# Patient Record
Sex: Male | Born: 1995 | Race: White | Hispanic: No | Marital: Single
Health system: Midwestern US, Academic
[De-identification: ages and names within clinical notes are randomized; demographics above are authoritative.]

---

## 2017-03-03 MED ORDER — PERFLUTREN LIPID MICROSPHERES 1.1 MG/ML IV SUSP
1-20 mL | Freq: Once | INTRAVENOUS | 0 refills | Status: CP
Start: 2017-03-03 — End: ?

## 2017-05-15 ENCOUNTER — Ambulatory Visit: Admit: 2017-05-15 | Discharge: 2017-05-16 | Payer: BC Managed Care – HMO

## 2017-05-15 ENCOUNTER — Encounter: Admit: 2017-05-15 | Discharge: 2017-05-15

## 2017-05-15 DIAGNOSIS — F250 Schizoaffective disorder, bipolar type: Principal | ICD-10-CM

## 2017-05-15 DIAGNOSIS — F200 Paranoid schizophrenia: Principal | ICD-10-CM

## 2017-05-15 MED ORDER — DIVALPROEX 500 MG PO TB24
2000 mg | ORAL_TABLET | Freq: Every evening | ORAL | 3 refills | Status: SS
Start: 2017-05-15 — End: 2017-06-20

## 2017-05-15 MED ORDER — PALIPERIDONE 9 MG PO TR24
9 mg | ORAL_TABLET | Freq: Every day | ORAL | 1 refills | Status: SS
Start: 2017-05-15 — End: 2017-06-20

## 2017-05-15 NOTE — Progress Notes
Date of Service: 05/15/2017     Subjective:             Jamie [name] is a 21 y.o. male with hx schizoaffective disorder and clozapine-induced myocarditis. Multiple past hospitalizations and 2 prior suicide attempts (jumped off bridge and overdosed on antipsychotic around age 66).    History of Present Illness  Last seen 02/13/17 and no changes were made, continued on Invega 9mg  daily and Depakote 2000mg  qHS.    Today he is accompanied by his father.    They report things have been stable.  Jamie [name] has continued to have periodic episodes where he will have what he terms a break from reality but he reports these episodes do not bother him and he is able to ignore them and not act upon them.  He gives an example of walking down the street and seeing a tree that he is seen several times before appear differently, he wonders if he tried to climb the tree if the branches would be the way he perceives them or the way they were in the past.  He has a small red spot on his wrist that he will stare at, and feels like the shape will change as he stares at it and smaller shapes will develop within it.  He wonders if there is some meaning to this and does not believe that other people have this ability.  He finds it somewhat frustrating. He has not had any impulsive or risky behaviors. Mood has been good. He has some lethargy related to medications but finds it tolerable, gives himself more time to sleep on account of this. Energy, concentration and appetite have been stable.    He continues to live with his 2 brothers, and they moved into a new apartment where he has his own room.  He is unable to drive, but there is a bus stop nearby, and he can use that to get to the [place].  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  He is excited to be able to work.    He reports good compliance with medications.  Every now and then notices a very mild tremor, but prefers not to take Cogentin due to the dry mouth it causes.  He has been trying to diet by limiting portion size, and has also been exercising more to help with his weight.    He denies auditory hallucinations.  Denies suicidal and homicidal ideation.    Social update:  Parents (father and stepmother) are legal guardians.  Approved for disability 2018.  Planning to start a part-time job as Financial risk analyst or in retail in near future.  Does not have car access or insurance due to impulsive behavior and concern for safety.  Lives with his brothers in an apartment.   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  Denies tobacco or alcohol use.  Denies drug use.         Review of Systems   Constitutional: Negative for appetite change and unexpected weight change.   Respiratory: Negative for shortness of breath.    Cardiovascular: Negative for chest pain.   Gastrointestinal: Negative for constipation, diarrhea, nausea and vomiting.   Neurological: Negative for dizziness and syncope.         Objective:         ??? benztropine (COGENTIN) 0.5 mg tablet Take 1 tablet by  mouth twice daily as needed.   ??? carvedilol (COREG) 3.125 mg tablet Take 2 tablets by mouth twice daily. Take with food.   ??? divalproex (DEPAKOTE ER) 500 mg ER tablet Take 4 tablets by mouth at bedtime daily. Take with food.   ??? lisinopril (PRINIVIL, ZESTRIL) 2.5 mg tablet Take 1 tablet by mouth daily.   ??? melatonin 5 mg tab Take 5 mg by mouth at bedtime as needed.   ??? omeprazole DR(+) (PRILOSEC) 40 mg capsule Take 40 mg by mouth at bedtime daily.   ??? paliperidone(+) (INVEGA) 9 mg tablet Take 1 tablet by mouth daily.   ??? spironolactone (ALDACTONE) 25 mg tablet Take 1 tablet by mouth daily with food.     Vitals:    05/15/17 1616   BP: 123/82   Pulse: 69   Weight: 110 kg (242 lb 6.4 oz)     Body mass index is 36.86 kg/m???.     Physical Exam   Psychiatric:   Mental Status Evaluation:??? General/Constitutional: white man, causal attire, good grooming  Speech: normal RRVT   Behavior: cooperative, calm  Thought Process: linear, goal oriented  Thought Content: Denies SI/HI, + magical thinking and delusions   Perception: Denies AVH, does not appear to be responding to internal stimuli  Associations: fair  Mood: good  Affect: euthymic  Insight/Judgement: fair/fair    Orientation: oriented x4  Recent and remote memory: intact  Attention span and concentration: average  Language: [employer] of knowledge and vocabulary: average    Focused Physical Exam:    Gait: Normal        Assessment and Plan:  Schizophrenia   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, following with cardiology  GERD, Obesity   Disabled, parents are legal guardians  Supportive family, no drug/alcohol/tobacco use, motivated to work, cannot drive     Previous med trials: Lithium (bradycardia), Haldol (dystonia, oversedation; likely dose related; patient very reluctant to try again but has tolerated since), Geodon (ineffective), Seroquel (uncontrolled sx), Risperdal (stable for 6-16mos but then relapsed despite compliance, also weight gain), clozapine (myocarditis)    Previously diagnosed with schizoaffective disorder, however after longitudinal review he does not have a primary affect of disorder.  He has had episodes where he would impulsively travel across the country to avoid delusional consequences, however this appears to be more in the context of a delusion then a manic episode.  Diagnosis changed to schizophrenia today.  He is doing well on his current medication regimen, living with his brothers, and is planning to start a part-time job soon.  He is still interested in therapy to augment behavioral strategies to prevent unsafe behaviors when delusions occur.    - Cont Invega 9mg  daily for psychosis   - AIMS 0    - Metabolic labs completed 02/2017  - Cont Depakote ER 2000mg  qHS for mood   - Level 102 02/2017 - Cont Cogentin 0.5mg  BID PRN muscle spasms/rigidity  - Will re-message Dr. Lowry Bowl regarding therapy  - Continues to follow with cardiology     Discussed resources available in crisis including 911, ED, physician on call    RTC 3 months or PRN    Gaylene Brooks, DO

## 2017-05-15 NOTE — Progress Notes
ATTENDING NOTE      Encounter Date: 05/15/2017    I saw and evaluated Jamie [name], discussed with Gaylene Brooks, DO and concur with the assessment and treatment plan.     PRESENTING PROBLEM AND BACKGROUND: Patient is 21 y.o. male with diagnosis bipolar vs schizoaffective disorder hospitalized Feb 2017 and April 2017 for depression with suicidal ideation and in August 2016 and Jan 2018  for psychotic manic episode. Patient started on clozapine during January 2018 hospitalization but developed myocarditis and clozapine discontinued. Cardiac status returned to baseline and patient started on paliperidone along with divalproex. He father is his legal guardian. He recently moved from his parents home to living with two of his brothers near his parents. Last visit patient stable and medications and doses unchanged.     CURRENT TREATMENT AND RESPONSES: Patient and father report relative stability since last visit. 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 Patient describes good mood. He relates delusional experiences (see Dr. Fabian Sharp notes for details) but which he states he is able to ignore. Father reports no unusual behavior. Patient and father agreeable to continuing on current medications at current doses.    PLAN:  1. Continue paliperidone 9 mg qd, divalproex 2000 mg qhs, and benztropine 0.5 mg bid  2. Monitor affect, psychosis, suicidal ideation  3. If paliperidone unable to be continued due to cost, consider switch to risperidone  4. Monitor AIMS, weight and metabolic syndrome labs

## 2017-05-18 ENCOUNTER — Ambulatory Visit: Admit: 2017-05-18 | Discharge: 2017-05-19

## 2017-05-18 ENCOUNTER — Encounter: Admit: 2017-05-18 | Discharge: 2017-05-18

## 2017-05-18 ENCOUNTER — Ambulatory Visit: Admit: 2017-05-18 | Discharge: 2017-05-18

## 2017-05-18 ENCOUNTER — Ambulatory Visit: Admit: 2017-05-18 | Discharge: 2017-05-18 | Payer: BC Managed Care – HMO

## 2017-05-18 DIAGNOSIS — I428 Other cardiomyopathies: Principal | ICD-10-CM

## 2017-05-18 DIAGNOSIS — F250 Schizoaffective disorder, bipolar type: Principal | ICD-10-CM

## 2017-05-18 LAB — BASIC METABOLIC PANEL
Lab: 1 mg/dL (ref 0.4–1.24)
Lab: 11 mg/dL (ref 7–25)
Lab: 137 MMOL/L (ref 137–147)
Lab: 26 MMOL/L (ref 21–30)
Lab: 4.6 MMOL/L (ref 3.5–5.1)
Lab: 6 (ref 3–12)
Lab: 86 mg/dL (ref 70–100)
Lab: 9.9 mg/dL (ref 8.5–10.6)
Lab: >60 mL/min (ref >60)
Lab: >60 mL/min (ref >60)

## 2017-05-18 NOTE — Progress Notes
Date of Service: 05/18/2017    Jamie [name] is a 21 y.o. male.       HPI     Jamie [name] was seen today in heart failure clinic for routine follow-up visit.  He was last seen in our clinic clinic on 03/03/2017 by Dr. Leda Gauze.  During his last office visit he was thought to be doing well with his volume status and his medications were not adjusted.  He was discussed with him that he should consider weight loss and his current echo was reviewed with him.  Dr. Leda Gauze mentioned that he would like to repeat exercise treadmill study to evaluate for any nonsustained VT along with his exercise capacity to determine if he could be removed from his guideline directed medical therapy. Jamie [name] has a history of systolic heart failure with recovered EF thought to be secondary to viral myocarditis in the setting of concomitant use of clozapine.    Echocardiogram on 03/03/2017 showed EF 50%, valves are unremarkable, LVIDD 5.73 centimeters, RV basal diameter 4.26 cm, systolic PA pressure 23 mmHg.    Today Jamie [name] reports since he was last seen in clinic he has been doing well.  He is hoping to be removed off some of HF his medications.  He is excited because starting Monday he will start a new job at [employer].  Denies having any shortness of breath, no cough. Denies chest pain, pressure, or palpitations.  No dizziness or lightheadedness. Denies lower extremity edema or abdominal distention.  He sleeps laying prone and denies PND.  He is not following a low-sodium diet or fluid restriction, also not monitoring his weights.  He reports he completed his treadmill exercise study today and is asking what the results are. He is quite defiant that his heart became weak after being initiated on clozapine and he does not believe that it was related to myocarditis.     Past medical history includes schizoaffective disorder, obesity, GERD, HFrEF with recovered EF, myocarditis.         Vitals:    05/18/17 1256   BP: 140/70 Pulse: 88   SpO2: 98%   Weight: 110.2 kg (243 lb)   Height: 1.727 m (5' 8)     Body mass index is 36.95 kg/m???.     Review of Systems   Constitution: Negative.   HENT: Negative.    Eyes: Negative.    Cardiovascular: Negative.    Respiratory: Negative.    Endocrine: Negative.    Hematologic/Lymphatic: Negative.    Skin: Negative.    Musculoskeletal: Negative.    Gastrointestinal: Negative.    Genitourinary: Negative.    Neurological: Negative.    Psychiatric/Behavioral: Negative.    Allergic/Immunologic: Negative.    All other systems reviewed and are negative.      Physical Exam  General Appearance: overweight male, in no acute distress   Skin: warm, dry  Eyes: conjunctivae and lids normal, pupils are equal and round   Teeth/Gums/Palate: dentition unremarkable, no lesions   Lips & Oral Mucosa: no pallor or cyanosis   Neck Veins: JVP 6 cm, no HJR  Chest Inspection: chest is normal in appearance   Respiratory Effort: breathing comfortably, no respiratory distress   Auscultation: lungs clear to auscultation, no rales or rhonchi, no wheezing   Cardiac Rhythm: regular rhythm and normal rate   Cardiac Auscultation: S1, S2 normal, no rub, no definite S3  or S4   Murmurs: no murmur    Peripheral Circulation: normal peripheral  circulation   Radial Arteries: normal symmetric radial pulses   Pedal Pulses: normal symmetric pedal pulses   Lower Extremity Edema: no lower extremity edema   Abdominal Exam: soft, round, obese, non-tender, no masses, bowel sounds normal   Gait & Station: walks without assistance   Orientation: oriented to time, place and person   Affect & Mood: appropriate and sustained affect, argumentive    Language and Memory: patient responsive and seems to comprehend information   Neurologic Exam: neurological assessment grossly intact   Vital signs were reviewed    Cardiovascular Studies  None today    Problems Addressed Today  Encounter Diagnoses   Name Primary?   ??? Cardiomyopathy Yes   ??? Viral myocarditis ??? HTN        Assessment and Plan     1.  Cardiomyopathy.  Jamie [name] presents today with NYHA functional class I AHA stage B symptoms.  His medications include carvedilol 6.25 mg bid, lisinopril 2.5 mg daily, spironolactone 25 mg daily.  He is a guideline directed medical therapies for his heart failure.  Today on exam he appears stable with his volume assessment.  He is not requiring loop diuretics.  At the time of his initial presentation in January 2018 he was found to have flulike symptoms, cardiac MRI showed mild myocarditis thought to be viral process however he had also been recently initiated on clozapine.  Initially his EF declined to 25% however quickly returned to normal function.  Most recent echo shows EF of 50%.  He has verbalized that he does not think he needs his heart failure medications and is asking today what medications he can stop taking.  He wants to know the results of his treadmill exercise test today.  Discussed with him that he will need to remain on his current medications until Dr. Chales Abrahams has a chance to review his study, will contact him with Dr. Urban Gibson recommendations and if he recommends that Jamie [name] can discontinue any of his HF medications we will provide him this information at that time.  Encouraged him to consider trying to follow a low-salt diet and fluid restriction; he has been resistant to any heart failure education up to this point.  2.  HTN.  Blood pressure today in clinic is 140/70.  Discussed with him that his carvedilol and lisinopril are both helping keep his blood pressure controlled which ultimately is better for his heart.  He is agreeable to maintain his current medications however again emphasizes that he wants to be stopped on them as soon as possible.  3.  Follow-up.  Instructed to schedule appointment in the upcoming 5-6 months with Dr. Leda Gauze.  Will plan to ask Dr. Leda Gauze to review his exercise treadmill study; then will have our clinic staff contact Jamie [name] with his recommendations regarding his medications.  Jamie [name] states understanding and is in agreement with this plan.  Please see AVS for full patient teaching.    Education/care coordination and counseling time spent: 30 minutes of 35 minute visit.  Topics included HF disease process, treatment options, treatment risks and benefits, medication instructions, medication interactions, daily weight monitoring, sodium restriction, understanding of HF symptoms, awareness of when and whom call, and when to schedule next office visit.     Thank you for the opportunity to participate in this pleasant patient's care. Please contact me with any concerns or questions.     Satira Anis APRN   Pager [phone]    Collaborating MD:AJS  Current Medications (including today's revisions)  ??? benztropine (COGENTIN) 0.5 mg tablet Take 1 tablet by mouth twice daily as needed.   ??? carvedilol (COREG) 3.125 mg tablet Take 2 tablets by mouth twice daily. Take with food.   ??? divalproex (DEPAKOTE ER) 500 mg ER tablet Take 4 tablets by mouth at bedtime daily. Take with food.   ??? lisinopril (PRINIVIL, ZESTRIL) 2.5 mg tablet Take 1 tablet by mouth daily.   ??? melatonin 5 mg tab Take 5 mg by mouth at bedtime as needed.   ??? paliperidone ER (+) (INVEGA) 9 mg tablet Take 1 tablet by mouth daily.   ??? spironolactone (ALDACTONE) 25 mg tablet Take 1 tablet by mouth daily with food.

## 2017-05-24 ENCOUNTER — Encounter: Admit: 2017-05-24 | Discharge: 2017-05-24

## 2017-06-14 ENCOUNTER — Encounter: Admit: 2017-06-14 | Discharge: 2017-06-14

## 2017-06-14 NOTE — Telephone Encounter
Patient calls, states it's close to September, and he always experiences mania during his conception month.  Recent concerns for pt is getting sent home from work.  Pt states he loves his job, has been promoted to making pizzas, however the chemical smell/drugs in the air caused side effects that made it impossible for him to continue working.  Pt states he's experienced sexual stimulation and light-headedness with the recent chemical issue at work.  Pt also has felt he's experiencing a chemical/drug exposure in his home.  Pt reports he is safe at home today.  He reports hallucinating, as "my mind is like a computer when I look at the wall... I see numbers like I'm a computer."  Pt denies AH, although he reports that electrical devices can hear him, and he can hear the muted TV with ear buds in.    Offered appointment Wednesday, 8/15 at 8:00 am, arrive at 7:45am.  Pt appreciative, states he's been healthy for 7 months and doesn't want to end up being admitted again.  Pt reports med compliance; denies new stressors.

## 2017-06-15 ENCOUNTER — Encounter: Admit: 2017-06-15 | Discharge: 2017-06-15

## 2017-06-15 ENCOUNTER — Ambulatory Visit: Admit: 2017-06-15 | Discharge: 2017-06-16 | Payer: BC Managed Care – HMO

## 2017-06-15 DIAGNOSIS — R45851 Suicidal ideations: Secondary | ICD-10-CM

## 2017-06-15 DIAGNOSIS — R443 Hallucinations, unspecified: ICD-10-CM

## 2017-06-15 DIAGNOSIS — F250 Schizoaffective disorder, bipolar type: Principal | ICD-10-CM

## 2017-06-15 LAB — URINALYSIS DIPSTICK: Lab: NEGATIVE mg/dL (ref 0.4–1.24)

## 2017-06-15 LAB — ALCOHOL LEVEL: Lab: <10 mg/dL (ref 7–40)

## 2017-06-15 LAB — PHENCYCLIDINES-URINE RANDOM: Lab: NEGATIVE U/L (ref 25–110)

## 2017-06-15 LAB — COMPREHENSIVE METABOLIC PANEL
Lab: 135 MMOL/L — ABNORMAL LOW (ref 137–147)
Lab: >60 mL/min (ref >60)
Lab: >60 mL/min (ref >60)

## 2017-06-15 LAB — OPIATES-URINE RANDOM: Lab: NEGATIVE g/dL (ref 3.5–5.0)

## 2017-06-15 LAB — SALICYLATE LEVEL

## 2017-06-15 LAB — ACETAMINOPHEN LEVEL: Lab: <10 ug/mL (ref <20.1)

## 2017-06-15 LAB — CBC AND DIFF: Lab: 6.2 10*3/uL (ref 4.5–11.0)

## 2017-06-15 LAB — COCAINE-URINE RANDOM: Lab: NEGATIVE mg/dL (ref 0.3–1.2)

## 2017-06-15 LAB — BENZODIAZEPINES-URINE RANDOM: Lab: NEGATIVE mg/dL (ref 8.5–10.6)

## 2017-06-15 LAB — AMPHETAMINES-URINE RANDOM: Lab: NEGATIVE /HPF (ref 0–3)

## 2017-06-15 LAB — BARBITURATES-URINE RANDOM: Lab: NEGATIVE mg/dL (ref 0–5)

## 2017-06-15 LAB — URINALYSIS, MICROSCOPIC

## 2017-06-15 LAB — CANNABINOIDS-URINE RANDOM: Lab: NEGATIVE g/dL — AB (ref 6.0–8.0)

## 2017-06-15 MED ORDER — RISPERIDONE 4 MG PO TAB
4 mg | Freq: Every evening | ORAL | 0 refills | Status: DC
Start: 2017-06-15 — End: 2017-06-16
  Administered 2017-06-16: 02:00:00 4 mg via ORAL

## 2017-06-15 MED ORDER — SPIRONOLACTONE 25 MG PO TAB
25 mg | Freq: Every day | ORAL | 0 refills | Status: DC
Start: 2017-06-15 — End: 2017-06-21
  Administered 2017-06-16 – 2017-06-20 (×6): 25 mg via ORAL

## 2017-06-15 MED ORDER — BENZTROPINE 1 MG PO TAB
0.5 mg | Freq: Two times a day (BID) | ORAL | 0 refills | Status: DC | PRN
Start: 2017-06-15 — End: 2017-06-21

## 2017-06-15 MED ORDER — LISINOPRIL 5 MG PO TAB
2.5 mg | Freq: Every day | ORAL | 0 refills | Status: DC
Start: 2017-06-15 — End: 2017-06-21
  Administered 2017-06-16 – 2017-06-20 (×5): 2.5 mg via ORAL

## 2017-06-15 MED ORDER — MELATONIN 5 MG PO TAB
5 mg | Freq: Every evening | ORAL | 0 refills | Status: DC
Start: 2017-06-15 — End: 2017-06-21
  Administered 2017-06-16 – 2017-06-20 (×5): 5 mg via ORAL

## 2017-06-15 MED ORDER — DIVALPROEX 500 MG PO TB24
2000 mg | Freq: Every evening | ORAL | 0 refills | Status: DC
Start: 2017-06-15 — End: 2017-06-17
  Administered 2017-06-16: 02:00:00 2000 mg via ORAL

## 2017-06-15 MED ORDER — CARVEDILOL 6.25 MG PO TAB
6.25 mg | Freq: Two times a day (BID) | ORAL | 0 refills | Status: DC
Start: 2017-06-15 — End: 2017-06-21
  Administered 2017-06-16 – 2017-06-20 (×10): 6.25 mg via ORAL

## 2017-06-15 NOTE — Progress Notes
Subjective:       Jamie [name] is a 21 y.o. male with a history of schizophrenia vs schizoaffective d/o and clozapine-induced myocarditis. Last seen on 7/16 by Dr. Donnald Garre and at that time was continued on Invega 9mg  Qday and Depakote 2000mg  QHS.     Patient called in yesterday with complaints concerning for decompensation and was scheduled for an appointment this AM.    Presents this AM stating that he has been feeling somewhat manic. States that he has been inhaling a drug at work that has been planted there by [employer]. Feels that [employer] has been targeting him historically and states he does not know why. 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 He is unable to specify what this drug is, maybe THC?Marland Kitchen He denies using any actual drugs himself in a voluntary fashion. States that I don't want to be high, but that the drug he smells makes him feel that way. Describes being high as being sexually over stimulated, feeling happy, sometimes losing touch with reality. In addition states that he feels more paranoid than usual, feeling like people are watching him and talking about him. 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 States that he likes this job but, I don't go to work to get high.     Does recall another time where he was exposed to this drug, back when he was working at [employer] in 2016, and this lead him to quit that job. States that other people can also smell the scent, but tell him it's just a chemical, however, believes it affects him differently because my biology is different, and that the drugs are designed to target him some how. Stating that [employer] is a [employer], so can do this if they want.     Denies experiencing any AH. Denies VH other than one instant of seeing small flashes of numbers, starting at end of his peripheral vision and then going across. Was able to make himself stop viewing it by shifting his concentration, this was yesterday.     In addition, states that the drug has now spread to his home, and everywhere I go. States that he can notice it in very small concentrations where others cannot. Last time smelled the drugs prior to these past few days was in Oct of 2017.     Describes ability to control the weather. States that he has a Belief that I'm supernatural. Does at times feel that he is getting special messages from people, things on their car. Things that happen are Not coincidental - gives example of flipping coins a certain number of times and receiving special messages based on what they want me to know at the time, in reference to spiritual entities.      Mood is elevated and agitated. Denies feeling depressed. States that he is trying to force himself to sleep but feels like he can go for several nights without any sleep if he allows himself to.     In addition, states that best thing that can happen  to me is death, but I cannot die. States that he tried to kill himself in the past but seems to always get rescued. States I would slit my throat right now if I thought I would die, but I know someone would just take me to the hospital and fix it. Recalls his past suicide attempts and voices that he is unable to die. He would like to end his life because spirital beings have big plans for him, which are evil plans that he does not want to engage in and therefore feels like ending his life would be best. Also states that if he knew who planted the drugs in his work and home that he would want to kill those people, however, doesn't know which individuals did this at this time, other than [employer].     States that the only reason he came in today was to see this provider's opinion as to whether he should go back to work and try to keep his job or if he should stop going given that he is being exposed to the drugs there. Doesn't want to start new medication and is very resistant to having medications adjusted as well, describes having hadbad history with trials of new medications.     Drugs - denies  Alcohol - Denies    Currently living with his brothers             Review of Systems   Constitutional: Negative for fatigue.   Eyes: Positive for visual disturbance.   Musculoskeletal: Negative for gait problem.   Psychiatric/Behavioral: Positive for suicidal ideas. Negative for dysphoric mood.         Objective:         ??? benztropine (COGENTIN) 0.5 mg tablet Take 1 tablet by mouth twice daily as needed.   ??? carvedilol (COREG) 3.125 mg tablet Take 2 tablets by mouth twice daily. Take with food.   ??? divalproex (DEPAKOTE ER) 500 mg ER tablet Take 4 tablets by mouth at bedtime daily. Take with food.   ??? lisinopril (PRINIVIL, ZESTRIL) 2.5 mg tablet Take 1 tablet by mouth daily.   ??? melatonin 5 mg tab Take 5 mg by mouth at bedtime as needed.   ??? paliperidone ER (+) (INVEGA) 9 mg tablet Take 1 tablet by mouth daily.   ??? spironolactone (ALDACTONE) 25 mg tablet Take 1 tablet by mouth daily with food.     Vitals:    06/15/17 0812   BP: 121/68   Pulse: 57   Weight: 104.8 kg (231 lb)   Height: 172.7 cm (68)     Body mass index is 35.12 kg/m???.     Physical Exam   Psychiatric:   General/Constitutional: Adult caucasian male who appears stated age, dressed in casual attire, fair hygiene/grooming   Eye Contact: Good  Behavior: Cooperative, calm, Inappropriate laughter  Speech: Mild impediment, normal tone/volume/rate  Mood: elevated and agitated  Affect: Euthymic to bright, full range, stable, congruent  Thought Process: Linear, goal oriented Thought Content: Endorsing SI on which he does not intend to act due to grandiose delusion of being unable to die, endorsing HI towards perpetrators within his delusion of being forced to inhale a drug/chemical, denies having a specific target in mind. Describes having superpowers and being supernatural, states he can control the weather at times. Religious preoccupation: believes he is a product of demons mating with humans, and makes references to biblical passages.   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 Notes today that this is around the time of year that he tends to get manic. He presents with paranoid delusions that are progressively becoming more pervasive over the past few days, and from a standpoint of mania expresses grandiosity, decreased need for sleep, elevated mood and hypersexuality. He is also with SI from which he is holding back on acting due to him believing he cannot die. He also expresses HI that is a component of his delusion, and does not seem to be, at this time, fixated on any particular individual. It is not unlikely, and based on past history of SA, that patient's decompensation can progress towards him harming himself or having his paranoia progress to where his HI is directed to a particular individual.     - Patient is agreeable for voluntary inpatient admission, was somewhat hesitant at first but voices understanding that it may not be safe for him to go back home  - Voices ongoing compliance with Invega 9mg  Qday and Depakote ER 2000mg  QHS.   - Will defer medication adjustment to inpatient team. Patient seen and discussed with Dr. Georjean Mode

## 2017-06-15 NOTE — Progress Notes
ATTENDING NOTE      Encounter Date: 06/15/2017    I saw and evaluated Jamie [name], discussed his care with Jamie Coder, MD, and concur with the assessment and treatment plan except as otherwise/additionally noted.     PRESENTING PROBLEM AND BACKGROUND:  Patient is a 21 y.o. male with BMD-I versus schizoaffective D/O with h/o multiple hospitalizations, including 5 at [facility] in 2017 and most recent hospitalization at [facility] in January 2018.  Had 2 SAs in early 2017, including an OD and jumping off of a 41' bridge; other hospitalizations were for psychosis/mania.  Was started on clozapine during January 2018 hospitalization but subsequently developed myocarditis and was changed to paliperidone with continuation of Depakote ER.      Was last seen 05/15/17, when he was continued on paliperidone 9mg /day, Depakote ER 2000mg  qhs, and benztropine 0.5mg  bid PRN EPS.    CURRENT TREATMENT AND RESPONSES:  Pt reports that he has been starting to feel manic over the last few days, noting elevated mood, increased energy, and some hypersexuality.  Denied any recent use of EtOH or illicit drugs, and reports good compliance with current meds.    Affect appeared expansive during my interview, with elaborate delusional and paranoid ideation verbalized.  Believes he would be better off dead b/c he is a Ambulance person, and other spiritual beings constantly want him to do evil things he would rather not do.  Told Dr. Truddie Coco that he would slit his throat immediately, if he thought he would die (believes that he cannot die b/c past serious SAs have failed); additionally voiced a desire to kill others if he knew who to target for exposing him to a chemical both at his place at work and at his home.      03/03/17 VPA = 102.0 (on 2000mg  qhs)    PLAN:  Recommended hospitalization for safety and stabilization d/t elevated risk for harm to self/others; pt appears to meet criteria for involuntary hold but agreed to voluntary admission after initially expressing reluctance.  Might consider further titration of Invega versus cross-titration back to risperidone or another atypical; will defer med changes to the inpatient team.

## 2017-06-15 NOTE — ED Notes
Pt presents to the ED after being dropped off by his dad. States he is here for psych concerns. Reports "his work has been drugging him" and they have been giving him things he shouldn't be having. States he is seeing things that aren't there. Reports "scattered numbers all over walls" at times. When asked if homicidal, pt states "if I could kill some people and get away with it, I probably would". When asked if suicidal, pt states "yes, I have the pain of existing and I have tried to kill myself once before, and if I wasn't so big it would have been done by now". Pt talking constantly in room, going from idea to idea. Able to answer orientation questions appropriately. Changed into yellow gown. CO at bedside.

## 2017-06-15 NOTE — ED Notes
PLS RN assessment not done as patient seen by Dr. Tiffany Kocher this morning recommended for admission by Dr. Erick Colace and Dr. Alphonsus Sias and approved for admission by Dr. Marletta Lor.     Patient will be assigned to Dr. Ellyn Hack; Paged Dr. Ellyn Hack for orders. 3 OL bed is ready for patient.

## 2017-06-15 NOTE — ED Notes
Pt belongings include:    Black shoes  Black socks  Black wallet with Visa, Price chopper card  $20 cash  Cell phone Materials engineer  Cell phone  T shirt    Belongings in labeled pt belonging bag at bedside.

## 2017-06-15 NOTE — Progress Notes
Patient in PLS 2; given paper scrub pants and warm blanket

## 2017-06-16 LAB — 25-OH VITAMIN D (D2 + D3): Lab: 18 ng/mL — ABNORMAL LOW (ref 30–80)

## 2017-06-16 LAB — TSH WITH FREE T4 REFLEX: Lab: 0.8 uU/mL (ref 0.35–5.00)

## 2017-06-16 LAB — VALPROIC ACID LEVEL: Lab: 136 ug/mL — ABNORMAL HIGH (ref 50.0–100.0)

## 2017-06-16 MED ORDER — OLANZAPINE 10 MG IM SOLR
10 mg | INTRAMUSCULAR | 0 refills | Status: DC | PRN
Start: 2017-06-16 — End: 2017-06-21

## 2017-06-16 MED ORDER — RISPERIDONE 3 MG PO TAB
6 mg | Freq: Every evening | ORAL | 0 refills | Status: DC
Start: 2017-06-16 — End: 2017-06-21
  Administered 2017-06-17 – 2017-06-20 (×4): 6 mg via ORAL

## 2017-06-16 MED ORDER — OLANZAPINE 5 MG PO TAB
5 mg | ORAL | 0 refills | Status: DC | PRN
Start: 2017-06-16 — End: 2017-06-21

## 2017-06-16 MED ORDER — CHOLECALCIFEROL (VITAMIN D3) 1,000 UNIT PO TAB
1000 [IU] | Freq: Every day | ORAL | 0 refills | Status: DC
Start: 2017-06-16 — End: 2017-06-21
  Administered 2017-06-16 – 2017-06-20 (×5): 1000 [IU] via ORAL

## 2017-06-16 NOTE — Discharge Planning (AHS/AVS)
Case Management Discharge Instructions:    Follow up at [facility], 6th floor of Medical Office Building, phone: [phone].  Patient will follow up for psychiatric medication management with Dr. Gaylene Brooks.    Appointment Information:    Next appointment:  Dr. Donnald Garre on 06/30/17 @ 8:15 a.m.    Other Follow Up Information:    National Suicide Prevention Lifeline: (579) 399-6385.  We can all help prevent suicide. The Lifeline provides 24/7, free and confidential support for people in distress, prevention and crisis resources for you or your loved ones, and best practices for professionals.

## 2017-06-16 NOTE — Care Coordination-Inpatient
Submitted admission clinical to [facility] Submission ID [id number]

## 2017-06-16 NOTE — Progress Notes
Reason for Visit: Chaplain referral code 3.    Faith/Religion: Ephriam Knuckles    Source of Purpose/Meaning: Patient expressed a willingness to pursue normal activities such as holding a job.    Worries/Concerns/Struggles: Patient said he has a darkness / evil inside of him.  Patient said Prudy Feeler takes a special interest in him for some reason.  Patient said this darkness gives him a sense of importance, and that he feels like he has the potential to be something like a Surveyor, mining or world ruler.  Patient said that, in the past, he has felt like he is Jesus.    Patient also mentioned being hurt in the past by people whose identity he does not know.  He attributed much of the darkness to these individuals.  Patient said if he ever found out who they were he would have a fork in the road, and that he would have to pray about it.  Patient would not elaborate, but chaplain inferred that Patient would have to decide whether or not to do harm to these individuals.    Patient also expressed agitation over the idea that some people might attribute his spiritual experiences to a chemical in [his] brain.      Method(s) of Coping: Patient said he reads his Bible and prays, but he said he has had a difficult time reading recently because he starts to read the Bible but gets caught up and distracted in the hidden things.    Support System:  Patient only mentioned his brothers and his dad.    Interventions/Plan: Chaplain listened to Patient's story.  Chaplain also prayed at Patient's request.  Patient asked for prayer that he would be able to remember that the hospital staff are trying to support him.  Chaplain also suggested that Patient attempt to listen to Scripture instead of reading it.  Patient seemed to gain some sense of hope about reconnecting with Scripture this way.    The spiritual care team is available as needed, 24/7, through the campus switchboard [phone]).  For immediate response, please page [phone]. For a response within 24 hours, please submit an order in [facility] for a chaplain consult or call the administrative voicemail at [phone].

## 2017-06-16 NOTE — Progress Notes
Chart check complete.

## 2017-06-16 NOTE — H&P (View-Only)
PSYCHIATRY HISTORY & PHYSICAL EXAM    Room/Bed: GM0102/72    Admission Date:     06/15/2017                                                LOS: 1 day    ASSESSMENT & DIAGNOSIS  Jamie [name] is a 21 y.o. Caucasian male with a history of schizoaffective disorder who presented to the outpatient clinic with concerns for mania, SI, and HI. He was subsequently admitted to the inpatient unit with concerns for current manic episode and patient's safety. This episode appears to be precipitated by persistent delusions and paranoia resistant to multiple medication regimens. Factors that seem to have predisposed him to these symptoms include history of schizoaffective disorder and multiple hospitalizations in setting of inadequate medication trials.  This current problem is maintained by active psychosis, delusions, and paranoia. However, protective factors include good social support, consistent outpatient follow up, and moderate insight. Proposed treatment will consist of acute stabilization, continued medication management and psychotherapy.    DSM5 DIAGNOSES  1. Schizoaffective disorder, bipolar type, current episode manic, w/ psychotic features    PLAN  Schizoaffective Disorder  - Resume PTA Depakote 2000 mg QHS  - Obtain Valproic Acid (08/17)  - Risperdal 6 mg QHS   Increased 4 mg -> 6 mg (08/17)   - Planning on discharging patient on paliperidone (may need to increase dose to 12 mg qday)  - Vitamin D Level (08/17) - 18.1   - Begin cholecalciferol 1000 units QDAY  - Melatonin 5 mg QHS  - Benztropine 0.5 mg BID PRN  - Olanzapine 5 mg PO/10 mg IM PRN q4hrs agitation    Cardiomyopathy  - Lisinopril 2.5 mg PO QDAY  - Carvedilol 6.25 mg PO BID  - Spironolactone 25 mg PO QDAY    Patient seen and discussed with Dr. Shea Evans who agrees with plan of care    ______________________________________________________________  CHIEF CONCERN  The patient presents today for ???mania???      HISTORY OF PRESENT ILLNESS Jamie [name] is a 21 y.o. Caucasian male with a history of schizoaffective disorder who presented to the [facility] outpatient clinic with concerns for manic episode, SI and HI. This morning, patient states that he called the hospital for help because he was beginning to feel manic. He is upset that he was admitted to the hospital as he feels this was not necessary. He states that his thoughts regarding suicidal and homicidal ideation were taken out of context, and that he has the ability to go too deep because I am a very smart guy, he states I am smart enough not to hurt other people, even though I want to kill those responsible.    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 This drug is a high level hallucinogen and it has now made it's way into his home. He states that he can smell when the drug is present, stating that it has a musty smell. He continuously paces around his room, as he believes that if he stays in one place for too long, the drug will overtake him. He believes that demons and satan are the ones releasing this chemical. Patient references the bible several times throughout the interview, and continues to explain  that he believes that normal humans become possessed and controlled by the devil and the demons and he must protect himself from these people.    Patient describes his current mood as happy. He denies current feelings of depression. Patient endorses little sleep over the past 2 days, he believes that he has gotten anywhere from 0-4 hours of sleep per night. He notes increased abilities such as higher level coordination and being mentally enhanced. He describes difficultly with concentration, flight of ideas, and increased irritability.    Patient currently endorses conditional SI. He states that his last attempt to end his life (jumping off a bridge - Feb 2017) did not kill him, thus nothing will be able to kill him in the future. However, if he had the ability to die, he would choose to do so. He denies current AVH. Patient notes a history of visual hallucinations such as a scar on his right wrist which continually changes shape (money signs, letter M) and is controlled by the devil. He also notes the ability to live in an altered reality (seeing houses and cars which are not actually there) and then bringing them into the present reality where others can see them. He not a history of hearing voices tell him to hurt himself. He states that these voices are the voices of the demons.    Patient is prescribes paliperidone 9mg  QDAY, and Depakote 2000 mg QHS. He reports compliance with both. Valproic acid level is pending.    PSYCHIATRIC REVIEW OF SYMPTOMS  Depression (4/9): The patient endorses symptoms of depressed mood, sleep changes, anhedonia, feelings of guilt/worthlessness, fatigue, poor concentration, appetite changes, psychomotor agitation or retardation, and suicidal ideation.    Mania (3+): The patient endorses symptoms of elevated/expansive mood, decreased need for sleep, grandiosity, and pressured speech, flight of ideas, distractibility, and increase in goal directed activity, and hypersexuality.    Psychosis: The patient endorses symptoms of auditory or visual hallucinations, delusions, thought broadcasting, thought insertion, delusions of reference, catatonia, or disorganized speech or behavior.    Access to firearms? Denies      PAST PSYCHIATRIC HISTORY  Onset: 21 years old  Outpatient Provider: [facility] - Last visit prior to yesterday 07/16 w/ Dr. Donnald Garre  Diagnoses: Schizoaffective Disorder, bipolar type  Psychiatric Hospitalizations: 7 prior admissions to [facility]  Past Self-Injurious Behaviors: Multiple suicide attempts  Past Suicide Attempts: Jumped off bridge (Feb 2017), Overdose attempt (per chart review, April 2017)    Past Psychiatric Medication Trials & Responses Per chart review:  Current:    Invega 9 mg QDAY  Depakote 2000 mg QHS    Prior:  Lithium - bradycardia  Haldol - dystonia, sedation  Geodon - no benefit  Seroquel - no benefit  Risperdal - relapse after 8 mo  Clozapine - myocarditis    FAMILY PSYCHIATRIC HISTORY  Great Uncle - Schizophrenia      FAMILY MEDICAL HISTORY  Family History   Problem Relation Age of Onset   ??? Schizophrenia Maternal Uncle          PAST MEDICAL AND SURGICAL HISTORY  Past Medical History:   Diagnosis Date   ??? Schizoaffective disorder, bipolar type (HCC)      History reviewed. No pertinent surgical history.    Home Medications  Prescriptions Prior to Admission   Medication Sig Dispense Refill Last Dose   ??? benztropine (COGENTIN) 0.5 mg tablet Take 1 tablet by mouth twice daily as needed. 60 tablet 1 Past Month   ??? carvedilol (  COREG) 3.125 mg tablet Take 2 tablets by mouth twice daily. Take with food. 180 tablet 3 06/14/2017   ??? divalproex (DEPAKOTE ER) 500 mg ER tablet Take 4 tablets by mouth at bedtime daily. Take with food. 120 tablet 3 06/14/2017   ??? lisinopril (PRINIVIL, ZESTRIL) 2.5 mg tablet Take 1 tablet by mouth daily. 90 tablet 3 06/14/2017   ??? melatonin 5 mg tab Take 5 mg by mouth at bedtime as needed.   06/14/2017   ??? paliperidone ER (+) (INVEGA) 9 mg tablet Take 1 tablet by mouth daily. 90 tablet 1 06/14/2017   ??? spironolactone (ALDACTONE) 25 mg tablet Take 1 tablet by mouth daily with food. 90 tablet 3 06/14/2017       Hospital Medications  Scheduled Meds:  carvedilol (COREG) tablet 6.25 mg 6.25 mg Oral BID   divalproex (DEPAKOTE ER) ER tablet 2,000 mg 2,000 mg Oral QHS   lisinopril (PRINIVIL; ZESTRIL) tablet 2.5 mg 2.5 mg Oral QDAY   melatonin tablet 5 mg 5 mg Oral QHS   risperiDONE (RISPERDAL) tablet 4 mg 4 mg Oral QHS   spironolactone (ALDACTONE) tablet 25 mg 25 mg Oral QDAY   Continuous Infusions:  PRN and Respiratory Meds:benztropine BID PRN      Allergies  Clozapine and Haldol [haloperidol lactate]      SUBSTANCE USE Tobacco: Denies  Alcohol: Notes history of drinking mouth wash as a teenager. Denies current use.  Marijuana: Denies  Cocaine: Denies  Heroin: Denies  Methamphetamines/Stimulants: Denies  Prescription opiates: Denies      SOCIAL AND DEVELOPMENTAL HISTORY  Ferlin Petrenko Faulks was born and raised in  to [place], [place]. He denies a history of physical, sexual and emotional abuse.  The highest level of education he received was high school.  he is employed as a Public affairs consultant and worked at [employer] previously.  he is not married and has no children.  he currently lives in [place], [place] with his two brothers.    Social History     Social History   ??? Marital status: Single     Spouse name: N/A   ??? Number of children: N/A   ??? Years of education: N/A     Occupational History   ??? Not on file.     Social History Main Topics   ??? Smoking status: Never Smoker   ??? Smokeless tobacco: Never Used   ??? Alcohol use Yes      Comment: Pt occasionally drinks alcohol. His last use was 2-3 weeks ago. Pt does not report the amount or type of alcohol consumed. He said, I don't drink very much.   ??? Drug use: No   ??? Sexual activity: Not on file     Other Topics Concern   ??? Military Service No   ??? Blood Transfusions No   ??? Caffeine Concern No   ??? Occupational Exposure No   ??? Hobby Hazards No   ??? Weight Concern No   ??? Special Diet No   ??? Back Care No   ??? Exercise No     Social History Narrative   ??? No narrative on file         REVIEW OF SYSTEMS  Review of Systems   Constitutional: Negative for malaise/fatigue.   HENT: Negative for congestion and sore throat.    Eyes: Negative for blurred vision.   Respiratory: Negative for shortness of breath.    Cardiovascular: Negative for chest pain.   Gastrointestinal: Negative for abdominal pain, constipation,  diarrhea, nausea and vomiting.   Genitourinary: Negative for dysuria.   Musculoskeletal: Negative for myalgias.   Skin: Negative for rash.   Neurological: Negative for tremors and headaches. Psychiatric/Behavioral: Positive for suicidal ideas. Negative for depression, hallucinations, memory loss and substance abuse. The patient is nervous/anxious and has insomnia.        ______________________________________________________________  OBJECTIVE  Vital Signs:  Last Filed in 24 hours Vital Signs:  24 hour Range    BP: 135/63 (08/16 2000)  Temp: 36.8 ???C (98.3 ???F) (08/16 1550)  Pulse: 76 (08/16 2000)  Respirations: 20 PER MINUTE (08/16 1550)  SpO2: 100 % (08/16 1550)  O2 Delivery: None (Room Air) (08/16 1550)  Height: 172.7 cm (67.99) (08/16 1550) BP: (130-136)/(63-69)   Temp:  [36.8 ???C (98.3 ???F)]   Pulse:  [51-76]   Respirations:  [18 PER MINUTE-20 PER MINUTE]   SpO2:  [99 %-100 %]   O2 Delivery: None (Room Air)       MENTAL STATUS EXAMINATION  ??? General/Constitutional: Appears stated age. Dressed in Hospital doctor.  ??? Eye Contact: Appropriate  ??? Behavior: Cooperative at times, at times refusing to answer questions.  ??? Speech: Normal rate, normal volume  ??? Mood: happy  ??? Affect: guarded, reactive, incongruent with mood  ??? Thought Process: Linear, logical  ??? Thought Content: Endorses conditional SI, endorses HI (towards individuals who are possessed by demons and spreading the drugs)  ??? Perception: Denies current AVH.   ??? Associations: Loose  ??? Insight/Judgment: Fair/Fair    ??? Orientation: A/O x 3  ??? Recent and remote memory: Intact  ??? Attention span and concentration: Average  ??? Language: English - Fluent  ??? Fund of knowledge and vocabulary: Average      PHYSICAL EXAMINATION  Physical Exam   Constitutional: He is oriented to person, place, and time and well-developed, well-nourished, and in no distress. No distress.   HENT:   Head: Normocephalic and atraumatic.   Eyes: Conjunctivae and EOM are normal.   Neck: Normal range of motion.   Pulmonary/Chest: Effort normal. No respiratory distress.   Musculoskeletal: Normal range of motion.   Neurological: He is alert and oriented to person, place, and time. Gait normal.   Skin: Skin is dry. He is not diaphoretic.       LABORATORY/RADIOLOGIC/OTHER TESTS  24-hour labs:    Results for orders placed or performed during the hospital encounter of 06/15/17 (from the past 24 hour(s))   VALPROIC ACID LEVEL    Collection Time: 06/16/17  9:19 AM   Result Value Ref Range    Valproic Acid 136.0 (H) 50.0 - 100.0 MCG/ML   25-OH VITAMIN D (D2 + D3)    Collection Time: 06/16/17  9:19 AM   Result Value Ref Range    Vitamin D(25-OH)Total 18.1 (L) 30 - 80 NG/ML     ______________________________________________________________  Wynetta Emery, MS     Ernesta Amble, MD  PGY-2 Psychiatry  Pager [phone]

## 2017-06-16 NOTE — Progress Notes
Service(s): Inpatient Group Psychotherapy     Patient data/presentation/complaint: Mr. Thompkins was seen in group therapy (1300-1400). He described his mood as a 6 or 7 mood today and related that I dont need to be here. He was noted to engage in group discussion and complete assigned activity/homework during the meeting without difficulty. No other issues noted.      MSE: casually dressed and groomed, A+Ox4, mood a 6 or 7 mood, full range of affect, speech WNL, eye contact was WNL, TP linear and goal-directed, TC no overt (or reported) evidence of SI/HI/AH/VH, I poor-fair, J fair-good, active participation in therapy     Psychosocial Intervention(s): Supportive therapy with a focus on identifying strengths was provided. Response was positive    Plan: Will continue to follow on an inpatient basis.    Provider:     Alleen Borne, Ph.D., [phone]

## 2017-06-16 NOTE — Care Coordination-Inpatient
Psychiatry Insurance Review No. 1  Insurance Company: BC/BS           Insurance/Medicaid ID #: [id number]     Reference /Authorization # [id number]                                    Insurance Care Advocate: Fleet Contras  Approved : 4 days 8/16 to 8/19  LCD : 8/19    Next Review : 8/20

## 2017-06-16 NOTE — Progress Notes
Metabolics Readings:    Most Recent BP:    BP Readings from Last 1 Encounters:   06/16/17 112/60     Last Weight:   Vitals:    06/15/17 0951 06/15/17 1550   Weight: 104.8 kg (231 lb) 103.9 kg (229 lb)     Body mass index is 34.83 kg/m.    Lipid Panel:   LDL   Date Value Ref Range Status   03/03/2017 202 (H) <100 MG/DL Final     HDL   Date Value Ref Range Status   03/03/2017 40 (L) >40 MG/DL Final     VLDL   Date Value Ref Range Status   03/03/2017 46 MG/DL Final     Cholesterol   Date Value Ref Range Status   03/03/2017 285 (H) <200 MG/DL Final     Triglycerides   Date Value Ref Range Status   03/03/2017 232 (H) <150 MG/DL Final       Blood Glucose:  Hemoglobin A1C   Date Value Ref Range Status   03/03/2017 5.4 4.0 - 6.0 % Final     Comment:     The ADA recommends that most patients with type 1 and type 2 diabetes maintain   an A1c level <7%.         Second-generation antipsychotics (SGAs) are associated with metabolic dysregulation, including obesity, diabetes, dyslipidemia, and hypertension. The dramatic increase in diabetes, diabetic ketoacidosis, and death in the late 1990s and early 2000s prompted the FDA in August 2003 to apply a class warning to the labels of all SGAs and require that practitioners monitor metabolic parameters in patients receiving SGAs, before and after initiating therapy.    Pat Kocher, PHARMD

## 2017-06-16 NOTE — Progress Notes
OCCUPATIONAL THERAPY  ADULT PSYCH ASSESSMENT NOTE     Adult Psych Assessment Note    NAME:Jamie [name]             MRN: [id number]             DOB:02/09/1996          AGE: 21 y.o.  ADMISSION DATE: 06/15/2017             DAYS ADMITTED: LOS: 1 day    Date of Admission: 06/15/2017  Chart review/Initial contact: 06/16/2017  Attending Physician: Adela Lank, DO  Admission Diagnosis: Suicidal ideations    Pt referred to occupational therapy for evaluation and treatment of functioning in life roles.  Precautions: Homicidal and Routine    Reason For Admission:  Delusions (Paranoid), Hallucinations (Auditory), Hallucinations (Visual) and Homicidal  Comments:     Psychiatric History: Multiple hospitalizations and Past patient on this Adult Psychiatry Inpatient unit    Social History:   Marital Status: Single  Living Situation: with brothers  Vocational History: [employer] in [employer]     INTERVIEW  Subjective: I'm here for a manic episode.    Stressors: This crap's gotta happen.  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    Patient goal for hospitalization: to get out in the fastest time possible.  I don't want them to change my medications.  I'm not sick.  Well, I'm always a little sick.      Affect: Guarded and Unremarkable  ??? Mood: 6-7/10 -  I don't know.  A little bit of anger at being forced to come in here.  I don't have free will [place] free [place].   ??? Eye Contact: fair-Generally Within functional limits    Thought Process: Hallucinations:  Auditory   Visual   Delusions:  Paranoid     Patient seen in 0900 OT group on: Coping Skills  Management of symptoms  Self Management  Interaction:  Intrusive  Needs redirection back to topic  Tangential  expansive, attention-seeking  Activity Level:  Increased energy level   Concentration:  Able to adequately participate in group activity/discussion  Fair attention span    Comments: Adequate participation  Appeared receptive to information  Attentive to group activity/discussion  Engaged   Pt identified importance of identifying goals to accomplish.  Stated he'd like to lose weight and get into better shape.    SUMMARY: Patient well known to this therapist from previous inpatient psychiatry admissions.  Pt describes his hallucinations as an entire mixed reality.  Stated he read a parallel account of someone with a similar history, indicating belief in alternate reality.  Stated that [employer] is dumping chemicals at my work and my house.    STRENGTHS:  At least average intelligence  Employed  Family support  Known to psychiatry staff    PROBLEM AREAS: Delusions and Hallucinations    OT INTERVENTION  Pt to be seen x5 weekly for 1 week for:        1:1 as needed       Communication/Self Expression Group       Pt Education       Self Management Group       Task/Social skills group    GOALS: Participate without reference to hallucinations 25% OT activities  Participate without reference to paranoia 25% OT    Potential functional outcome: Fair    Therapist: Graylin Shiver,  OT   Date: 06/16/2017

## 2017-06-16 NOTE — Discharge Instructions
Case Management Discharge Instructions:    Follow up at [facility], 6th floor of Medical Office Building, phone: [phone].  Patient will follow up for psychiatric medication management with Dr. Gaylene Brooks.    Appointment Information:    Next appointment:  Dr. Donnald Garre on 06/30/17 @ 8:15 a.m.    Other Follow Up Information:    National Suicide Prevention Lifeline: (973)092-2420.  We can all help prevent suicide. The Lifeline provides 24/7, free and confidential support for people in distress, prevention and crisis resources for you or your loved ones, and best practices for professionals.

## 2017-06-16 NOTE — Case Management (ED)
Case Management Admission and Psychosocial Assessment    NAME:Jamie [name]                          MRN: [id number]             DOB:[birth year]          AGE: 21 y.o.  ADMISSION DATE: 06/15/2017             DAYS ADMITTED: LOS: 1 day      Today???s Date: 06/16/2017    Source of Information:   For completion of this SW Psychosocial Assessment:  * SW has reviewed patient's EMR.  * SW has met with patient 1:1.      Plan / Intervention:  Plan: CM Assessment, Psychosocial Assessment, Assist PRN with SW/NCM Services, Discharge Planning for Home Anticipated   Intervention:  * SW has met with patient for purpose of completion of SW Psychosocial Assessment and beginning hospital discharge planning.  He states he was compelled by his doctor to come in and although he was kinda bummed and not very happy about it he was willing to do so.    Presenting Problem:  Patient admitted from OP psychiatry Clinic appointment with Dr. Truddie [name], due to psychosis.  Patient has a belief he is being drugged by a substance at work, feels he has an ability to control the weather, believes I'm supernatural, with elevated and agitated mood, and feels he is getting special messages (from tv, from other people, per Dr. Mindi [name] evaluation, at times feel that he is getting special messages from people, things on their car. Things that happen are Not coincidental - gives example of flipping coins a certain number of times and receiving special messages based on what they want me to know at the time, in reference to spiritual entities.    Patient Address/Phone  [address] [place] [address]  [phone] (home)     Emergency Contact  Extended Emergency Contact Information  Primary Emergency Contact: Jamie [name]  Address: [address]           [place], [place] [address] [employer]  Home Phone: [phone]  Mobile Phone: [phone]  Relation: Mother  Secondary Emergency Contact: Jamie [name]  Address: [address] [place], [place] [address] [place]  Home Phone: [phone]  Mobile Phone: [phone]  Relation: Relative    Healthcare Directive  Healthcare Directive: No, patient does not have a healthcare directive  Would patient like to fill out a (a new) Healthcare Directive?: No, patient declined  Psych Advance Directive (Psych unit only): No, patient does not have a Health visitor  Does the patient need discharge transport arranged?: No  Transportation Name, Phone and Availability #1: Jamie [name], (step)mother, phone: [phone] (H),  [phone] (M)   Does the patient use Medicaid Transportation?: No    Expected Discharge Date  Expected Discharge Date: 06/23/17    Living Situation Prior to Admission  ? Living Arrangements  Living Arrangements: Family members (Patient currently lives with his brothers in [place]  Can patient live on one level if needed?: N/A  Assistance needed prior to admit or anticipated on discharge: No   Patient is single, never married with no children.    ? Level of Function   Prior level of function: Independent     ? Cognitive Abilities   Cognitive Abilities: Alert and Oriented, Continue to Assess  Indicated the Highest  Level of  Education: HS graduate with some college.    Financial Resources  ? Coverage  Primary Insurance: Clinical cytogeneticist Insurance: Medicaid  Additional Coverage: RX    [employer], policy #: [id number]  [employer] [place] Medicaid, policy #: [id number]    ? Source of Income   Source Of Income: Employed (Patient works as a Theatre stage manager.  He has previously worked at [employer].)     ? Financial Assistance Needed?  N/A    Psychosocial Needs  ? Mental Health  Mental Health History: Yes  Agency name: [facility]  Mental Health Provider: Psychiatrist, Jamie [name]  Mental Health Symptoms: Detachment from reality (delusions) or paranoia, Inability to cope with daily problems or stress ? Substance Use History  Substance Use History Screen: No (Patient denies any alcohol and drug use.)     Current/Previous Services  ? PCP  Jamie [name], [phone], [phone]     ? Pharmacy    [id number] [place], [place] - [address] AT [place] [facility] [address]  [address] [place] [address]  Phone: [phone] Fax: [phone]    ? Durable Medical Equipment   Durable Medical Equipment at home: None     ? Home Health  Receiving home health: No     ? Hemodialysis or Peritoneal Dialysis  Undergoing hemodialysis or peritoneal dialysis: No     ? Tube/Enteral Feeds  Receive tube/enteral feeds: No     ? Infusion  Receive infusions: No     ? Private Duty  Private duty help used: No     ? Home and Community Based Services  Home and community based services: No     ? Ryan White  Ryan White: No     ? Hospice  Hospice: No     ? Outpatient Therapy  PT: No  OT: No  SLP: No     ? Skilled Nursing Facility/Nursing Home  SNF: No  NH: No     ? Inpatient Rehab  IPR: No     ? Long-Term Acute Care Hospital  LTACH: No     ? Acute Hospital Stay  Acute Hospital Stay: In the past  Was patient's stay within the last 30 days?: No  When did patient receive care?: 10/2016  Name of hospital: [facility]     Social Work Evaluation    Plan  ??? Plan Discussed with Patient / Family:  Yes  ??? Plan Agreed Upon with Patient / Family:  Yes  ??? Comments:    * Patient is anticipated to return home to live at time of hospital DC.  * Patient to resume OP Psychiatric care at [facility] OP [facility]iat[facility] with Dr. Betsy [name].    Optional Assessment Information  (e.g. Family background, sexual functioning / significant sexual history, etc.)  ???  Strengths -  * Family is supportive  * Patient has a home to return to  * Patient has healthcare insurance  * Patient is seeking tx voluntarily      Roselee Nova, LSCSW, ACSW  Social Work Case Manager  Pg. [phone]

## 2017-06-16 NOTE — Progress Notes
Chart check completed.

## 2017-06-16 NOTE — Progress Notes
Medical Student Progress Note -  Inpatient    NAME: Jamie [name]                                     MRN: [id number]                 DOB: December 12, 1995          AGE: 21 y.o.  ADMISSION DATE: 06/15/2017             DAYS ADMITTED: LOS: 1 day      Patient Presentation:  Met with patient from 2:26-3:20 pm    Patient was mostly euthymic during session.  He stated he is worried he will miss his class (information assistance) which starts on Monday and he will be kicked out of school.  He described it was very difficult for him to get into this class.  When asked what brought him into the hospital, he answered a manic episode I suppose so.  He further stated there was a misunderstanding by an attending where they thought he was suicidal when he said he would be okay with dying, but he can't die.  He has no plan to kill himself and his family doesn't deserve to go through that.    He stated there are people putting drugs in the air he breathes, affecting his biology.  He can smell these drugs when he's about to go to sleep.  The drugs cause him to stay awake for hours, become hypersexual, have hallucinations, become sleepy, they cause him to yell at his friends and family and have caused him to lose many of his jobs.  He would kill them if he could.  Patient could not tell me exactly who drugs him.  He stated they drug him because he does not take the knee and bow to them.  They have green eyes and there are some here at [facility] as well.  They used to scare him, but now he thinks just kill me, who cares.  When asked further what he would do if he knew who these people were, he shared that he can hurt them with information he has on them.  They have closed his twitter and facebook accounts because of the information he has.    Patient reported growing up in the 90s and was home schooled.  He lost his mother at the age of 79.  He calls his step mother, mom.  They are 5 brothers and sisters.  He was interested in sports (baseball, basketball, football and soccer) and was a wild child, but never did anything crazy like drinking.  He stated his mental problems started at the age of 34.  This caused him to not be so close with his father and mother.  They gave him medications which caused him to be catatonic for 6 months, he had to rely on other to take care of him and this was difficult for him.  They said Stiles, we will put you on this.  And God signed off on it.  He is not mad at God for what has happened.  When asked what his mental health is, he stated it is his half demon and half human nature.  Patient explained he discovered since his last hospitalization, mainly through numbers and calculations, that he is a nephilim.  They are created when a demon mates with a human.  And  therefore he can't die.  He explained his first, middle and last names are all 6 letters and so they are the number 666.  Also the number of Ns in his names adds to #42 which is the meaning of life.  He was born for something greater than the middle class, he always thought he was to become a Copy or a Occupational hygienist.  He was born/conceived 2000 years after Jesus was born and 6000 years after the world was 27 months old.  He has the ability to curse others and also to make them better.  But it frustrates him that he can't do the same for himself.  They (Satan) tell him to join with them and they will give him everything he wants.  When asked if he talks to [facility], he says he could but he shuts them off instead.      He reported they say he has schizoaffective disorder and bipolar, but he says he doesn't have these disorders.  He takes all his medications because his father is his guardian.  He is frustrated for not being able to be independent, to have a drivers licence and hold a full time job.  He stated the things that happen to him mimic others with mental illness, but his other symptoms are very different than theirs.  There have been times that he is in alternative reality where no gravity exists.      Session was ended as patient wanted to meet with the Chaplain.    MSE:   General: good eye contact, casually dressed and appropriately groomed  Speech: regular rate and rhythm  Thought process: linear   Thought content: Denied SI and no HI towards a specific person  Associations: no loose associations  Mood: have been better  Affect: euthymic, and excited at times when talking about delusions  Insight/Judgement: poor/poor  Orientation: Alert and oriented      Plan:  Will continue to follow in inpatient unit.

## 2017-06-17 NOTE — Progress Notes
Chart check completee

## 2017-06-17 NOTE — Progress Notes
Psychiatric Progress Note    LOS: 2 days  Voluntary/Involuntary Status: Voluntary  Guardian? parents      MEDICATIONS  Current Psychotropic Medications:   -  PTA Depakote 2000 mg QHS, held dose on 8/17 and 8/18 evening due to elevated level of 136, repeat level on 8/19  - Risperdal 6 mg QHS(08/17 increased from 4mg )      ASSESSMENT & DIAGNOSIS  Jamie [name] is a 21 y.o. Caucasian male with a history of schizoaffective disorder who presented to the outpatient clinic with concerns for mania, SI, and HI. He was subsequently admitted to the inpatient unit with concerns for current manic episode and patient's safety. This episode appears to be precipitated by persistent delusions and paranoia resistant to multiple medication regimens. Factors that seem to have predisposed him to these symptoms include history of schizoaffective disorder and multiple hospitalizations in setting of inadequate medication trials.  This current problem is maintained by active psychosis, delusions, and paranoia. However, protective factors include good social support, consistent outpatient follow up, and moderate insight. Proposed treatment will consist of acute stabilization, continued medication management and psychotherapy.  ???  DSM5 DIAGNOSES  1. Schizoaffective disorder, bipolar type, current episode manic, w/ psychotic features  ???  PLAN  Schizoaffective Disorder  - hold PTA Depakote 2000 mg QHS  - Obtain Valproic Acid (08/19)  - Risperdal 6 mg QHS               Increased 4 mg -> 6 mg (08/17)               - Planning on discharging patient on paliperidone (may need to increase dose to 12 mg qday)  - Vitamin D Level (08/17) - 18.1               - Begin cholecalciferol 1000 units QDAY  - Melatonin 5 mg QHS  - Benztropine 0.5 mg BID PRN  - Olanzapine 5 mg PO/10 mg IM PRN q4hrs agitation  ???  Cardiomyopathy  - Lisinopril 2.5 mg PO QDAY  - Carvedilol 6.25 mg PO BID  - Spironolactone 25 mg PO QDAY  ??? -AIMS to be conducted later in hospitalization  -Metabolic labs ordered for 8/19    Patient seen and discussed with Dr. Fara Boros .        ______________________________________________________________  SUBJECTIVE    When asked why he came to the hospital, patient states that he was hit by a drug. Patient insist that that these people that he would never be able to find, drugged him. Howevever, patient states he is doing fine today. He says he is not wanting to hurt anyone around him and he is not trying to find the people that drugged him, but if he would just by chance find them then he would want to kill them.     Patient mentioned recent weight loss that could be attributing to his high levels of Depakote. Patient denies tremor, n/v, dizziness. Given patient's symptoms of delusions and hallucinations (still mentions wrist mark moving that the devil put there) it was discussed with patient that he might need to switch mood stabalizers. However, patient says he wants to stay on Depakote and insist that he is doing well on it. This will be discussed again in upcoming days.     REVIEW OF SYSTEMS  1. Psychiatric: + AVH  2. General: no acute distress    ______________________________________________________________  OBJECTIVE  Vital Signs:  Current                Vital Signs: 24 Hour Range   BP: 114/65 (08/17 1942)  Pulse: 64 (08/17 1942) BP: (114)/(65)   Pulse:  [64]    Intensity Pain Scale 0-10 (Pain 1): (not recorded)      Scheduled Medications:    carvedilol (COREG) tablet 6.25 mg 6.25 mg Oral BID   cholecalciferol (VITAMIN D-3) tablet 1,000 Units 1,000 Units Oral QDAY   lisinopril (PRINIVIL; ZESTRIL) tablet 2.5 mg 2.5 mg Oral QDAY   melatonin tablet 5 mg 5 mg Oral QHS   risperiDONE (RISPERDAL) tablet 6 mg 6 mg Oral QHS   spironolactone (ALDACTONE) tablet 25 mg 25 mg Oral QDAY       PRN Medications:  benztropine BID PRN, OLANZapine Q4H PRN **OR** OLANZapine Q4H PRN    MENTAL STATUS EXAMINATION ??? General/Constitutional: Appears stated age. Dressed in Hospital doctor.  ??? Eye Contact: Appropriate  ??? Behavior: Cooperative at times, at times refusing to answer questions.  ??? Speech: Normal rate, normal volume  ??? Mood: happy  ??? Affect: guarded, reactive, incongruent with mood  ??? Thought Process: Linear, logical  ??? Thought Content: Endorses conditional SI, endorses HI (towards individuals who are possessed by demons and spreading the drugs)  ??? Perception: Denies current AVH.   ??? Associations: Loose  ??? Insight/Judgment: Fair/Fair  ???  ??? Orientation: A/O x 3  ??? Recent and remote memory: Intact  ??? Attention span and concentration: Average  ??? Language: English - Fluent  ??? Fund of knowledge and vocabulary: Average  ???  ???  PHYSICAL EXAMINATION  Physical Exam   Constitutional: He is oriented to person, place, and time and well-developed, well-nourished, and in no distress. No distress.   HENT:   Head: Normocephalic and atraumatic   ______________________________________________________________  Troy Sine, MD

## 2017-06-17 NOTE — Progress Notes
Chart check completed

## 2017-06-17 NOTE — Progress Notes
Patient's Depakote level was 136 this morning. ROC Dr. Dalbert Batman notified and order received to hold HS Depakote for tonight.

## 2017-06-18 LAB — VALPROIC ACID LEVEL: Lab: 23 ug/mL — ABNORMAL LOW (ref 50.0–100.0)

## 2017-06-18 MED ORDER — DIVALPROEX 500 MG PO TB24
1500 mg | Freq: Every evening | ORAL | 0 refills | Status: DC
Start: 2017-06-18 — End: 2017-06-19
  Administered 2017-06-19: 02:00:00 1500 mg via ORAL

## 2017-06-18 NOTE — Progress Notes
OCCUPATIONAL THERAPY  REC THERAPY DAILY NOTE    Daily Note    Patient seen x2 this date.  Documentation reflects all daily treatment sessions.  Pt attended activity/group: Yes  Pt participated: Fully  Pt's interactions: Eye contact when spoken to and Initiated interaction with others, patient is competitive when playing games.  Activity was: Music listening, card games  Plan: Continue to attend therapeutic recreation group as possible.  Therapist: Clare Charon, CTRS  Date: 06/18/2017

## 2017-06-18 NOTE — Progress Notes
Chart check completed

## 2017-06-18 NOTE — Progress Notes
Psychiatric Progress Note    LOS: 3 days  Voluntary/Involuntary Status: Voluntary  Guardian? Parents      MEDICATIONS  Current Psychotropic Medications:   1. Risperidone 6mg  Qhs   2. Depakote 1500mg  Qhs      ASSESSMENT AND DIAGNOSES   Jamie [name] is a 21 y.o. male with history of schizoaffective disorder who was admitted from clinic for mania, SI and HI. He continues to feel that [facility] is trying to kill him but denies any SI/HI. He continues to be hyperverbal and tangential.    Diagnosis  1. Schizoaffective disorder, bipolar type, current episode manic with psychotic features.       PLAN  - Will resume Depakote at 1500mg  Qhs (PTA dose is 2000mg ).   - On admission 8/17 level was 136   - 06/18/17 level is 23.7   - Will get repeat level in order to assure no lab error was made with the current level prior to giving dose tonight.    - Continue Risperidone 6mg  (Pt PTA is on Invega)      Disposition: Continue admission    Seen and discussed with Dr. Fara Boros.    ______________________________________________________________  SUBJECTIVE  Pt was seen this AM. He states that overall his mood is fine and that he is upset that he got a little manic. He states that he is confused about how his depakote level go so high as well. He denies any direct SI/HI/AVH but states that [facility] is trying to kill him and that he will have to likely quit his job due to this. He also was tangential and at times difficult to follow with this logic.     REVIEW OF SYSTEMS  1. Psychiatric: Positive for continued mania and delusions  2. GI: Negative for nausea, vomiting, diarrhea.     ______________________________________________________________  OBJECTIVE                 Vital Signs:  Current                Vital Signs: 24 Hour Range   BP: 125/60 (08/19 0800)  Temp: 36.6 ???C (97.9 ???F) (08/19 0800)  Pulse: 67 (08/19 0800)  Respirations: 18 PER MINUTE (08/19 0800)  SpO2: 96 % (08/19 0800) O2 Delivery: None (Room Air) (08/19 0800) BP: (125-126)/(58-60)   Temp:  [36.6 ???C (97.9 ???F)]   Pulse:  [67-74]   Respirations:  [18 PER MINUTE]   SpO2:  [96 %]   O2 Delivery: None (Room Air)   Intensity Pain Scale 0-10 (Pain 1): (not recorded)      Scheduled Medications:    carvedilol (COREG) tablet 6.25 mg 6.25 mg Oral BID   cholecalciferol (VITAMIN D-3) tablet 1,000 Units 1,000 Units Oral QDAY   divalproex (DEPAKOTE ER) ER tablet 1,500 mg 1,500 mg Oral QHS   lisinopril (PRINIVIL; ZESTRIL) tablet 2.5 mg 2.5 mg Oral QDAY   melatonin tablet 5 mg 5 mg Oral QHS   risperiDONE (RISPERDAL) tablet 6 mg 6 mg Oral QHS   spironolactone (ALDACTONE) tablet 25 mg 25 mg Oral QDAY       PRN Medications:  benztropine BID PRN, OLANZapine Q4H PRN **OR** OLANZapine Q4H PRN      Mental Status Exam  ??? General/Constitutional: 21 y.o. male appears stated age, Dressed in casual attire. Good eye contact.   ??? Speech: Elevated rate and tone when talking about [facility].  ??? Thought Process: Tangential and difficult to keep on topic.  ??? Thought  Content: Denies any SI/HI  ??? Perception: no AVH  ??? Associations: no loose associations  ??? Mood: fine  ??? Affect: congruent and full  ??? Insight/Judgement: fair/fair    ??? Orientation: Clear Sensorium  ??? Recent and remote memory: intact  ??? Attention span and concentration: intact  ??? Language: fluent  ??? Fund of knowledge and vocabulary: average      Focused Physical Exam  ??? Neurological: Grossly intact  ??? Musculoskeletal: moves all 4 extremities without difficultly         Vance Gather, MD  Psychiatry PGY-3  Pager: [phone]

## 2017-06-19 LAB — VALPROIC ACID LEVEL
Lab: 14 ug/mL — ABNORMAL LOW (ref 50.0–100.0)
Lab: 74 ug/mL — ABNORMAL LOW (ref 50.0–100.0)

## 2017-06-19 MED ORDER — DIVALPROEX 500 MG PO TB24
2000 mg | Freq: Every evening | ORAL | 0 refills | Status: DC
Start: 2017-06-19 — End: 2017-06-21
  Administered 2017-06-20: 02:00:00 2000 mg via ORAL

## 2017-06-19 NOTE — Progress Notes
Chart check completed

## 2017-06-19 NOTE — Progress Notes
OCCUPATIONAL THERAPY  ADULT PSYCH DAILY TREATMENT NOTE     NAME:Jamie [name]             MRN: [id number]             DOB:07/30/1996          AGE: 21 y.o.  ADMISSION DATE: 06/15/2017             DAYS ADMITTED: LOS: 4 days    Date of Service: 06/19/17  Patient seen x1 this date.  Documentation reflects all daily treatment sessions.    Pt seen in 0900 OT group on:  Supportive Relationships    Affect: Euthymic and Unremarkable    Eye Contact: Within functional limits    Interaction:  Circumstantial  Intrusive  Needs redirection back to topic  Spontaneously interacted  Tangential   Responds to virtually every discussion point.    Activity Level:  Patient tolerated full treatment session, remaining seated at table with peers entire time  Within functional limits    Concentration:  Limited insight   Frequently off-topic  Frequently responds to discussion points, frequently making challenging comments indirectly related to topic.  Needs much redirection back to topic.      Comments:  Appeared receptive to information  Attentive to group activity/discussion  Engaged  Expansive   Attention-seeking.  Did ask how much social support the typical person needs.  Admitted realization that he needs more social support.    GOALS: Participate without reference to hallucinations 25% OT activities  Participate without reference to paranoia 25% OT    Plan: OT 5x/week for self management    Recommend: continued education in symptom management and community based resources    Therapist: Graylin Shiver, OT  Date: 06/19/2017

## 2017-06-19 NOTE — Care Coordination-Inpatient
Submitted admission clinical to [facility] Submission ID [id number]

## 2017-06-19 NOTE — Progress Notes
Service(s): Inpatient Group Psychotherapy     Patient data/presentation/complaint: Jamie [name] was seen in group therapy (1100-1200). He described his mood as pretty good today and reported anticipation of discharge tomorrow She was noted to be attentive and engaged in discussion of group material. He appeared to respond well to group material and discussion. No other issues noted.     MSE: casually dressed and groomed, A+Ox4, mood pretty good, bright affect, speech WNL, eye contact was WNL, TP linear, TC no overt (or reported) evidence of SI/HI/AH/VH, I fair, J fair-good, active participation in therapy     Psychosocial Intervention(s): Supportive therapy with a focus on sleep hygiene was provided. Response was positive     Plan: Will continue to follow on an inpatient basis.    Provider:     Dondra Spry, PhD, MS4    Alleen Borne, Ph.D., [phone]

## 2017-06-19 NOTE — Progress Notes
A medication education group was performed today, and Jamie [name] attended all of the group.  He did actively participate in group.  Patient had a good understanding of material in group.    Common psychiatric medications were reviewed for indications, side effects, and onset of effect.  Methods for medication adherence were addressed with group.    Jamie [name] was able to share some good insight with fellow group members regarding what they should do if they do not agree with the medications that they are being prescribed (discuss with their doctor).     Will discuss with team in rounds.      Provider:  Hazle Quant, PharmD, MS

## 2017-06-19 NOTE — Med Student Progress Note
Medical Student Progress Note -  Inpatient    NAME: Jamie [name]                                     MRN: [id number]                 DOB: 09-30-1996          AGE: 21 y.o.  ADMISSION DATE: 06/15/2017             DAYS ADMITTED: LOS: 4 days      Patient Presentation:  Met with patient from 12:57 - 1:26 pm    Patient was in euthymic mood.  He is feeling good and is looking forward to leaving tomorrow.  Patient reports being less manic since he came to the hospital.  He describes the weekend as depressing, as there was nothing to do here.  He stated it is boring to be with the other patients who have so much to deal with.  He thinks very highly of himself and even if he was in the same situation as other patient are, it would not have much of an impact on him.    He is worried when he goes home the drugs will still be there or happen again.  He doesn't know how it happens.  When asked how he can help it not affect him, patient suggested opening the windows, using a fan or walking outside.  He stated he does not know how much credence this medical student can give to his him saying a government agent is doing this.  But he can't just sit around and do nothing when they are making him hallucinate.  Patient went on to describe some of these hallucinations.  They can include virtual realities, and beautiful architectures and paintings, but he knows they are not real.  Patient asked this student to look up the book JASHER and the chapter on Darin Engels and Marcello Moores.  He described he can see things as not real just as Darin Engels could see the river they passed as not being real.  He stated satan can make physical matter at will.  He has the ability to recognize when things are not real.  He can recognize evil and satan in someone, as these people will be beyond what a human should be (like a person who is too beautiful).  When asked why does the satan make these fake things for him to see? He said he would rather not get into it.  Prudy Feeler does it because he is evil.  It happens to the patient because of his genes and birth so he can't run from it, it is inside of him.  When asked if he can shut them out and ignore them? Patient stated then he ends up in the hospital like now as they drug him.  He doesn't want to talk about it anymore as they know what he is saying now.    Patient stated he has been dealing with difficult things all of his life.  He tried to end his life twice, one by taking pills and once by jumping 60 feet.  He did this because he decided he doesn't want to be evil and hurt anyone.  But he wasn't able to die, they have even taken this right away from him.  He states mental illness isn't as simple as chemicals in the brain.  When he becomes manic he can do anything he sets his mind to.  That's why he wants to be manic sometimes as every part of him will be enhanced. He can cause his manic episodes by drinking energy drinks, coffee and not sleeping.  But he stated he doesn't do these things.  Patient shared he is frustrated that he is not able to be more independent and make more of his own decisions.  He thinks he was tricked, as his parents said they would only become his guardian in cases when he becomes manic so they can help him, but now they have broken this promise and they give him no independence.  He is not able to have the job he wants, to have a car or a Cabin crew.  He believes this is due to one episode that he stole his grandparents car and went to [employer].  He knows he will have manic episodes every few months, but he doesn't think his life should be taken away from him because of these episodes.  He feels he has no control and this causes him to want to do extreme stuff, like going to [place].  He shared he has not been able to talk to his family about this issue, as they do not discuss things.        MSE:   General: good eye contact, casually dressed and appropriately groomed Speech: regular rate and rhythm  Thought process: linear and to the point, answers questions but with fixed delusions  Associations: no loose associations  Mood: Good  Affect: euthymic, appropriate to situation, some irritations when discussing family dynamics  Insight/Judgement: poor/poor  Orientation: Alert and oriented      Plan:  Patient would benefit from continued outpatient therapy, especially for helping with family dynamics.

## 2017-06-19 NOTE — Progress Notes
Chart check completed.

## 2017-06-19 NOTE — Progress Notes
OCCUPATIONAL THERAPY  REC THERAPY DAILY NOTE    Daily Note    Patient seen x2 this date.  Documentation reflects all daily treatment sessions.  Pt attended activity/group: Yes  Pt participated: Fully  Pt's interactions: Eye contact when spoken to and Initiated interaction with others, helpful to others  Activity was: Dorisann Frames, [address] [employer] [place], [employer], Curator, Academic librarian.  Plan: Continue to attend therapeutic recreation group as possible  Therapist: Clare Charon, CTRS  Date: 06/19/2017

## 2017-06-19 NOTE — Progress Notes
Pt had staff take his wallet out so he could count his money. Stated it was all there

## 2017-06-19 NOTE — Progress Notes
Psychiatric Progress Note    LOS: 4 days  Voluntary/Involuntary Status: Voluntary  Guardian? Yes, father and stepmother.      MEDICATIONS  Current Psychotropic Medications:   1. Depakote 2000 mg QHS  2. Risperdal 6 mg QHS  3. Olanzapine 5 mg PO/10 mg IM PRN q4hrs agitation  4. Benztropine 0.5 mg BID PRN    ASSESSMENT AND DIAGNOSES     Jamie [name] is a 21 y.o. Caucasian male with a history of schizoaffective disorder who presented to the outpatient clinic with concerns for mania, SI, and HI. He was subsequently admitted to the inpatient unit with concerns for current manic episode and patient's safety. This episode appears to be precipitated by persistent delusions and paranoia resistant to multiple medication regimens. Factors that seem to have predisposed him to these symptoms include history of schizoaffective disorder and multiple hospitalizations in setting of inadequate medication trials.  This current problem is maintained by active psychosis, delusions, and paranoia. However, protective factors include good social support, consistent outpatient follow up, and moderate insight. Proposed treatment will consist of acute stabilization, continued medication management and psychotherapy.    DSM5 DIAGNOSES  Schizoaffective disorder, bipolar type, current episode manic, w/ psychotic features      PLAN  Schizoaffective Disorder  - Depakote 2000 mg QHS   - D/c'd 08/17 in setting of high valproic acid level   - Restarted Depakote 1500 mg (08/19)   - Resume Depakote 2000 mg (08/20)  - Valproic Acid level   - 136 (08/17)   - 23.7 (08/19)   - 14.9 (08/19)   - Obtain additional level 08/20  - Risperdal 6 mg QHS               Increased 4 mg -> 6 mg (08/17)               - Planning on discharging patient on paliperidone (may need to increase dose to 12 mg qday)  - Vitamin D Level (08/17) - 18.1               - Begin cholecalciferol 1000 units QDAY  - Melatonin 5 mg QHS  - Benztropine 0.5 mg BID PRN - Olanzapine 5 mg PO/10 mg IM PRN q4hrs agitation  ???  Cardiomyopathy  - Lisinopril 2.5 mg PO QDAY  - Carvedilol 6.25 mg PO BID  - Spironolactone 25 mg PO QDAY    Disposition: Continue admission to inpatient psychiatry for further stabilization; potential discharge on 8/21 if depakote level therapeutic and patient has safe discharge plan    Patient discussed with Dr. Shea Evans who agrees with plan of care    ______________________________________________________________  SUBJECTIVE    Patient seen today in day room. He describes his mood as fine. However, he expresses frustration regarding his continued admission. He is aware that his Depakote level was high, but believes that this value is an error. He is agreeable to obtaining an additional Depakote level this evening (08/20), with plans to discharge on Tuesday (08/21) if therapeutic. He denies any acute concerns.    He denies current thoughts of SI/HI/AVH. He notes benefits from his admission, as he was probably a bit manic and these symptoms seem to have improved. Regarding safety plan, patient is aware that he should return to hospital or dial 911 if he is feeling suicidal or in crisis.    REVIEW OF SYSTEMS  1. Psychiatric: Negative for SI/HI/AVH.  2. Review of Systems   Constitutional: Negative for malaise/fatigue.  Respiratory: Negative for shortness of breath.    Cardiovascular: Negative for chest pain.   Gastrointestinal: Negative for abdominal pain, constipation, diarrhea, nausea and vomiting.   Neurological: Negative for headaches.     ______________________________________________________________  OBJECTIVE                 Vital Signs:  Current                Vital Signs: 24 Hour Range   BP: 127/59 (08/20 0800)  Temp: 36.8 ???C (98.2 ???F) (08/20 0800)  Pulse: 75 (08/20 0800)  Respirations: 20 PER MINUTE (08/20 0800)  SpO2: 97 % (08/20 0800) BP: (127-130)/(57-59)   Temp:  [36.8 ???C (98.2 ???F)]   Pulse:  [60-75]   Respirations:  [20 PER MINUTE] SpO2:  [97 %]    Intensity Pain Scale 0-10 (Pain 1): (not recorded)      Scheduled Medications:    carvedilol (COREG) tablet 6.25 mg 6.25 mg Oral BID   cholecalciferol (VITAMIN D-3) tablet 1,000 Units 1,000 Units Oral QDAY   divalproex (DEPAKOTE ER) ER tablet 2,000 mg 2,000 mg Oral QHS   lisinopril (PRINIVIL; ZESTRIL) tablet 2.5 mg 2.5 mg Oral QDAY   melatonin tablet 5 mg 5 mg Oral QHS   risperiDONE (RISPERDAL) tablet 6 mg 6 mg Oral QHS   spironolactone (ALDACTONE) tablet 25 mg 25 mg Oral QDAY       PRN Medications:  benztropine BID PRN, OLANZapine Q4H PRN **OR** OLANZapine Q4H PRN      Mental Status Exam:  ??? General/Constitutional: Appears stated age. Dressed in Hospital doctor. Calm, cooperative.  ??? Speech/Motor: Normal rate, normal volume. Spontaneous.  ??? Mood/Affect: fine/euthymic, congruent with mood  ??? Thought Process: Linear, logical  ??? Associations: Intact  ??? Thought Content: Denies SI/HI  ??? Perception: Denies AVH.  ??? Insight/Judgment: Fair/Fair    ??? Orientation: Grossly oriented  ??? Recent and Remote Memory: Intact  ??? Attention span and concentration: Average  ??? Language: English - Fluent  ??? Fund of knowledge and vocabulary: Average    Focused Physical Exam:  ??? Musculoskeletal: Moves all extremities independently  ??? Neuro: Grossly intact, gait normal   ______________________________________________________________  Jamie Emery, MS    Jamie Amble, MD  PGY-2 Psychiatry  Pager [phone]

## 2017-06-20 LAB — VALPROIC ACID LEVEL: Lab: 54 ug/mL (ref 50.0–100.0)

## 2017-06-20 MED ORDER — DIVALPROEX 500 MG PO TB24
2000 mg | ORAL_TABLET | Freq: Every evening | ORAL | 0 refills | 30.00000 days | Status: AC
Start: 2017-06-20 — End: 2017-06-30

## 2017-06-20 MED ORDER — PALIPERIDONE 6 MG PO TR24
12 mg | ORAL_TABLET | Freq: Every day | ORAL | 0 refills | 28.00000 days | Status: AC
Start: 2017-06-20 — End: 2017-06-30

## 2017-06-20 MED ORDER — PALIPERIDONE 6 MG PO TR24
12 mg | Freq: Every day | ORAL | 0 refills | Status: DC
Start: 2017-06-20 — End: 2017-06-20

## 2017-06-20 MED ORDER — CHOLECALCIFEROL (VITAMIN D3) 1,000 UNIT PO TAB
1000 [IU] | ORAL_TABLET | Freq: Every day | ORAL | 0 refills | Status: SS
Start: 2017-06-20 — End: 2019-05-05

## 2017-06-20 MED ORDER — DIVALPROEX 500 MG PO TB24
1500 mg | ORAL_TABLET | Freq: Every evening | ORAL | 3 refills | Status: CN
Start: 2017-06-20 — End: ?

## 2017-06-20 NOTE — Progress Notes
OCCUPATIONAL THERAPY  REC THERAPY DAILY NOTE    Daily Note    Patient seen x1 this date.  Documentation reflects all daily treatment sessions.  Pt attended activity/group: Yes  Pt participated: Fully  Pt's interactions: Eye contact when spoken to and Initiated interaction with others  Activity was: Pet therapy  Plan: Continue to attend therapeutic recreation group as possible.  Therapist: Nykeem Citro, CTRS  Date: 06/20/2017

## 2017-06-20 NOTE — Psychotherapy
Psychiatry Summary of Care     Date of Service:  06/20/2017    Name: Jamie [name]                                          DOB: 29-Dec-1995                  MRN:  [id number]    Arend Bahl is a 21 y.o. male.         Admit date:  06/15/2017         Discharge date:  06/20/17  Attending Physician:  Adela Lank, DO             *This document is to supplement the transfer of care, Discharge Summary to follow.    Inpatient Care  Reason for Inpatient admission: Concerns for manic episode, statements about being unable to die with unclear plan for patient safety  Surgical Procedures: None  Significant Diagnostic Studies and Procedures: labs: UDS pan-negative; VA level 136 on 8/18 but drawn in the morning. VA level 54 prior to discharge. Vit D 18  Discharge Diagnoses: Active Problems:    * No active hospital problems. *  Schizoaffective disorder, bipolar type    Tobacco Use:   History   Smoking Status   ??? Never Smoker   Smokeless Tobacco   ??? Never Used     Alcohol use:   History   Alcohol Use   ??? Yes     Comment: Pt occasionally drinks alcohol. His last use was 2-3 weeks ago. Pt does not report the amount or type of alcohol consumed. He said, I don't drink very much.       Post-Discharge/Patient self-management  Studies pending at discharge: none  Contact information for obtaining results of studies pending at discharge: Department of Psychiatry [phone]    Advanced Care Plan  Advance directives: Healthcare Directive: No, patient does not have a healthcare directive        Would patient like to fill out a (a new) Healthcare Directive?: No, patient declined  Psych Advance Directive (Psych unit only): No, patient does not have a Psych Advance Directive       Reason for not providing advance directive if applicable: N/A    Contact Information/Plan for Follow-Up2  24/7 Contact info: Department of Psychiatry [phone] Future Appointments Future Appointments  Date Time Provider Department Center   06/22/2017 9:30 AM Renita Papa, MBBS [facility] [place]   06/30/2017 8:15 AM Gerilyn Nestle., DO [facility]   08/14/2017 4:15 PM Gerilyn Nestle., DO [facility]       Primary Care Physician:  Alroy Bailiff   Verified         Medication List      Your Current Medications    benztropine 0.5 mg tablet  Commonly known as:  COGENTIN  Take 1 tablet by mouth twice daily as needed.        carvedilol 3.125 mg tablet  Commonly known as:  COREG  Take 2 tablets by mouth twice daily. Take with food.        cholecalciferol 1,000 units tablet  Commonly known as:  VITAMIN D-3  Take one tablet by mouth daily.        divalproex 500 mg ER tablet  Commonly known as:  DEPAKOTE ER  Take four tablets by mouth at  bedtime daily. Take with food.        lisinopril 2.5 mg tablet  Commonly known as:  PRINIVIL, ZESTRIL  Take 1 tablet by mouth daily.        melatonin 5 mg Tab  Take 5 mg by mouth at bedtime as needed.        paliperidone ER(+) 6 mg tablet  Commonly known as:  INVEGA  Take two tablets by mouth daily.  What changed:  ??? medication strength  ??? how much to take        spironolactone 25 mg tablet  Commonly known as:  ALDACTONE  Take 1 tablet by mouth daily with food.              Where to Get Your Medications      These medications were sent to [facility] #[id number] [place], [place] - [address] [facility] [address]  [address] [place] [address]    Phone:  [phone]   ??? cholecalciferol 1,000 units tablet  ??? divalproex 500 mg ER tablet  ??? paliperidone ER(+) 6 mg tablet

## 2017-06-20 NOTE — Progress Notes
Discharge Note:  Condition  Patient's mood and thought contents have improved and are appropriate for discharge.   Disposition   Patient is leaving with brother by car to home.   All items from room, belonging bin, medication room, and safe are returned to the patients.  Discharge Instructions  Discharge instructions and psychiatry summary of care were discussed and provided. A copy will be transmitted to the next level of care provider within  24 hours after discharge.   Patient/family expresses understanding.

## 2017-06-20 NOTE — Discharge Instructions - Pharmacy
Physician Discharge Summary      Name: Jamie [name] Medical Record Number: [id number] Date Of Birth:  November 10, 1995           Age:  21 years  Admit date:  06/15/2017   Discharge date:  06/20/2017    Attending Physician:  Dr. Shea Evans               Service: Psych/Adult    Physician Summary completed by: Al Corpus, MD    Reason for hospitalization: Concerns for manic episode, statements about being unable to die with unclear plan regarding patient safety    Significant PMH:   Past Medical History:   Diagnosis Date   ??? Schizoaffective disorder, bipolar type (HCC)     *    Allergies: Clozapine and Haldol [haloperidol lactate]    Admission Physical Exam notable for:    None  MSE: guarded and reactive affect, endorses conditional SI w/ no clear plan; endorses HI towards demon-possessed individuals which patient cannot identify, loose associations    Admission Lab/Radiology studies notable for:   Depakote level 136 (but not drawn at trough); repeat level 54 at appropriate trough time  Vit D 18   UDS pan-negative    Brief Hospital Course:  The patient was admitted and the following issues were addressed during this hospitalization: (with pertinent details).    Jamie [name] is a 21 yo white male with a history of schizoaffective disorder who presented to the [facility] outpatient clinic with concerns for manic episode, SI and HI. He was subsequently admitted to the inpatient psychiatry unit following transfer from the ED. During his initial interview, patient expressed frustration with his admission, as he believed his SI/HI was taken out of context; however, patient did endorse feeling as if he was entering a manic state. Upon admission, he endorsed conditional SI, saying that he would end his life if he believed it was possible. He also endorsed HI, but denied current AVH. Soon after admission, patient expressed paranoid delusions regarding the devil and demons spreading ???a high level hallucinogen??? into both his home and work. He described his mood at admission as ???happy???, and denied current feelings of depression. He also endorsed recent onset of decreased need for sleep, grandiosity, flight of ideas, and increased irritability. Patient was resumed on PTA Depakote 2000 mg QHS, PTA benztropine 0.5 mg, and he was started on Risperdal 4 mg QHS, which was titrated up to 6 mg QHS. Risperidone will be discontinued in light of resuming paliperidone at time of discharge. A Depakote level obtained on the morning of 08/17, was found to be super-therapeutic at 136.0. Depakote was subsequently held, and restarted at 1500 mg (08/19), with titration back to PTA 2000 mg at discharge. Depakote level on the evening prior to discharge (08/20) was found to be near therapeutic at 54.0.    As admission prolonged, patient???s symptoms of mania gradually resolved. He denied thoughts of SI/HI throughout admission and on the day of discharge. While delusions remained present at the time of discharge, the patient exhibited appropriate insight and judgement on exam. He attended all group and individualized therapy sessions, and was cooperative with the treatment team always. Regarding safety plan, both patient and his guardian stepmother Jamie [name]) were aware that the patient should return to the hospital or call 911 if he finds himself in crisis or a danger to himself or others. Guardian was agreeable with discharge plan for patient to return living with his brothers in [place]. Patient also  felt that this is a safe discharge plan for him. Patient was discharge on all PTA medications with the exception of paliperidone, which was increased from 9 to 12 mg PO QDAY. He denies SI/HI and AVH at time of discharge. He discharged back to home with his brother on 06/20/17 in stable condition.       Condition at Discharge: Stable    Discharge Diagnoses:    Active Problems:    * No active hospital problems. * Schizoaffective disorder, bipolar type, current episode manic      Surgical Procedures: None    Significant Diagnostic Studies and Procedures: noted in brief hospital course    Consults:  None    Patient Disposition: Home       Is the patient being discharged on two or more antipsychotic medications? No  Is the patient being discharged on any antipsychotic medications?    Yes  Metabolic Monitoring         Body mass index is 34.83 kg/m???.  Wt Readings from Last 3 Encounters:   06/22/17 104.3 kg (230 lb)   06/15/17 103.9 kg (229 lb)   06/15/17 104.8 kg (231 lb)     BP Readings from Last 3 Encounters:   06/22/17 130/70   06/20/17 117/60   06/15/17 121/68     Lab Results   Component Value Date    CHOL 285 (H) 03/03/2017    TRIG 232 (H) 03/03/2017    HDL 40 (L) 03/03/2017    LDL 202 (H) 03/03/2017    VLDL 46 03/03/2017    NONHDLCHOL 245 03/03/2017    CHOLHDLC 3.5 10/08/2015     Hemoglobin A1C   Date Value Ref Range Status   03/03/2017 5.4 4.0 - 6.0 % Final     Comment:     The ADA recommends that most patients with type 1 and type 2 diabetes maintain   an A1c level <7%.       No results found for: A1C    Patient instructions/medications:   Activity as Tolerated   It is important to keep increasing your activity level after you leave the hospital.  Moving around can help prevent blood clots, lung infection (pneumonia) and other problems.  Gradually increasing the number of times you are up moving around will help you return to your normal activity level more quickly.  Continue to increase the number of times you are up to the chair and walking daily to return to your normal activity level. Begin to work toward your normal activity level at discharge     Report These Signs and Symptoms   Please contact your doctor if you have any of the following symptoms: temperature higher than 100 degrees F, uncontrolled pain, persistent nausea and/or vomiting, difficulty breathing, chest pain, severe abdominal pain, headache, unable to urinate or unable to have bowel movement, suicidal and homicidal ideations.     Questions About Your Stay   For questions or concerns regarding your hospital stay. Call [phone]   Discharging attending physician: Adela Lank [[id number]]      Regular Diet   You have no dietary restriction. Please continue with a healthy balanced diet.           Medication List      Take these medications at their scheduled times      Indication   carvedilol 3.125 mg tablet  Dose:  6.25 mg  Take 2 tablets by mouth twice daily. Take with food.  Commonly known as:  COREG      cholecalciferol 1,000 units tablet  Dose:  1000 Units  Take one tablet by mouth daily.  Commonly known as:  VITAMIN D-3      divalproex 500 mg ER tablet  Dose:  2000 mg  Take four tablets by mouth at bedtime daily. Take with food.  Commonly known as:  DEPAKOTE ER      lisinopril 2.5 mg tablet  Dose:  2.5 mg  Take 1 tablet by mouth daily.  Commonly known as:  PRINIVIL, ZESTRIL      spironolactone 25 mg tablet  Dose:  25 mg  Take 1 tablet by mouth daily with food.  Commonly known as:  ALDACTONE         Take these medications as needed      Indication   benztropine 0.5 mg tablet  Dose:  0.5 mg  Take 1 tablet by mouth twice daily as needed.  Commonly known as:  COGENTIN      melatonin 5 mg Tab  Dose:  5 mg  Take 5 mg by mouth at bedtime as needed.               Pending items needing follow up: Routine depakote levels    Signed:  Al Corpus, MD  06/20/2017      cc:  Primary Care Physician:  Alroy Bailiff   Verified  Referring physicians:     Additional provider(s): Dr. Donnald Garre

## 2017-06-20 NOTE — Progress Notes
Psychiatric Progress Note    LOS: 5 days  Voluntary/Involuntary Status: Voluntary  Guardian? Yes, father and stepmother.      MEDICATIONS  Current Psychotropic Medications:   1. Depakote 2000 mg QHS  2. Risperdal 6 mg QHS  3. Olanzapine 5 mg PO/10 mg IM PRN q4h agitation  4. Benztropine 0.5 mg BID PRN    ASSESSMENT AND DIAGNOSES     Jamie [name] is a 21 y.o. Caucasian male with a history of schizoaffective disorder who presented to the outpatient clinic with concerns for mania, SI, and HI. He was subsequently admitted to the inpatient unit with concerns for current manic episode and patient's safety. This episode appears to be precipitated by persistent delusions and paranoia resistant to multiple medication regimens. Factors that seem to have predisposed him to these symptoms include history of schizoaffective disorder and multiple hospitalizations in setting of inadequate medication trials.  This current problem is maintained by active psychosis, delusions, and paranoia. However, protective factors include good social support, consistent outpatient follow up, and moderate insight. Proposed treatment will consist of acute stabilization, continued medication management and psychotherapy.    DSM5 DIAGNOSES  Schizoaffective disorder, bipolar type, current episode manic, w/ psychotic features      PLAN  Schizoaffective Disorder  - Depakote 2000 mg QHS   - D/c'd 08/17 in setting of high valproic acid level   - Restarted Depakote 1500 mg (08/19)   - Continue Depakote 2000 mg (08/20)  - Valproic Acid level   - 136 (08/17)   - 23.7 (08/19)   - 14.9 (08/19)   - 54.0 (08/20)  - Risperdal 6 mg QHS               - Increased 4 mg -> 6 mg (08/17)               - Will discharge on paliperidone 12 mg PO QDAY  - Vitamin D Level (08/17) - 18.1               - Continue cholecalciferol 1000 units QDAY  - Continue Melatonin 5 mg QHS  - Benztropine 0.5 mg BID PRN  - Olanzapine 5 mg PO/10 mg IM PRN q4hrs agitation  ??? Cardiomyopathy  - Continue Lisinopril 2.5 mg PO QDAY  - Continue Carvedilol 6.25 mg PO BID  - Continue Spironolactone 25 mg PO QDAY    Disposition: Discharge back to home with brother on 06/20/17 in stable condition    Patient discussed with Dr. Shea Evans who agrees with plan of care    ______________________________________________________________  SUBJECTIVE    Patient seen today at bedside. He slept well overnight, he notes good dreams and feels that this is the best he has slept since he has been on the unit. He continues to go to group therapy and notes benefit. He describes his mood as good.    Patient is concerned about his job as he leaves the hospital, as he is afraid that he may have been fired without knowing. He plans to contact his work following discharge, and if he is unable to return, he plans to start a search for new employment. Regarding his paranoid delusions at admission, patient believes that the drug which was harming him is still out there, he then states I know you don't believe me, but this is real life for me. Despite these feelings, he feels safe leaving the hospital and returning to live with his brothers. He denies current SI/HI/AVH.  Per conversation with [name] (stepmom - guardian), she is agreeable that this is a safe discharge for [name]. Regarding safety plan, both [name] and [name] understand that if the patient feels like he is in crisis or feels as if he is a danger to himself or others, he should return to the hospital or dial 911. However, patient states that he is unsure if he will be able to trust the hospital following this admission, as he was not sure if he needed to be admitted to begin with. He does believe that this admission has provided him benefit, but he feels as if my rights were violated.    REVIEW OF SYSTEMS  1. Psychiatric: Negative for SI/HI/AVH.  2. Review of Systems   Constitutional: Negative for malaise/fatigue. Respiratory: Negative for shortness of breath.    Cardiovascular: Negative for chest pain.   Gastrointestinal: Negative for abdominal pain, constipation, diarrhea, nausea and vomiting.   Neurological: Negative for headaches.     ______________________________________________________________  OBJECTIVE                 Vital Signs:  Current                Vital Signs: 24 Hour Range   BP: 117/60 (08/21 0800)  Temp: 36.8 ???C (98.2 ???F) (08/21 0800)  Pulse: 68 (08/21 0800)  Respirations: 18 PER MINUTE (08/21 0800)  SpO2: 98 % (08/21 0800) BP: (117-135)/(60-63)   Temp:  [36.8 ???C (98.2 ???F)]   Pulse:  [65-68]   Respirations:  [18 PER MINUTE]   SpO2:  [98 %]    Intensity Pain Scale 0-10 (Pain 1): (not recorded)      Scheduled Medications:    carvedilol (COREG) tablet 6.25 mg 6.25 mg Oral BID   cholecalciferol (VITAMIN D-3) tablet 1,000 Units 1,000 Units Oral QDAY   divalproex (DEPAKOTE ER) ER tablet 2,000 mg 2,000 mg Oral QHS   lisinopril (PRINIVIL; ZESTRIL) tablet 2.5 mg 2.5 mg Oral QDAY   melatonin tablet 5 mg 5 mg Oral QHS   risperiDONE (RISPERDAL) tablet 6 mg 6 mg Oral QHS   spironolactone (ALDACTONE) tablet 25 mg 25 mg Oral QDAY       PRN Medications:  benztropine BID PRN, OLANZapine Q4H PRN **OR** OLANZapine Q4H PRN      Mental Status Exam:  ??? General/Constitutional: Appears stated age. Dressed in Hospital doctor. Calm, cooperative.  ??? Speech/Motor: Normal rate, normal volume. Spontaneous.  ??? Mood/Affect: good / euthymic, congruent with mood  ??? Thought Process: Linear, logical  ??? Associations: Intact  ??? Thought Content: Denies SI/HI; evidence of delusions   ??? Perception: Denies AVH  ??? Insight/Judgment: Fair/Fair    ??? Orientation: Grossly oriented  ??? Recent and Remote Memory: Intact  ??? Attention span and concentration: Average  ??? Language: English - Fluent  ??? Fund of knowledge and vocabulary: Average    Focused Physical Exam:  ??? Musculoskeletal: Moves all extremities independently  ??? Neuro: Grossly intact, gait normal ______________________________________________________________  Wynetta Emery, MS    Ernesta Amble, MD  PGY-2 Psychiatry  Pager [phone]

## 2017-06-20 NOTE — Progress Notes
Chart check completed.

## 2017-06-20 NOTE — Progress Notes
OCCUPATIONAL THERAPY  ADULT PSYCH DAILY TREATMENT NOTE     NAME:Jamie [name]             MRN: [id number]             DOB:Nov 10, 1995          AGE: 21 y.o.  ADMISSION DATE: 06/15/2017             DAYS ADMITTED: LOS: 5 days    Date of Service: 06/20/17  Patient seen x1 this date.  Documentation reflects all daily treatment sessions.    Pt seen in 0900 OT group on:  Supportive Relationships    Affect: Euthymic    Eye Contact: Within functional limits    Interaction:  Increased verbalization  Intrusive by tangential/circumstantial statements off topic.  Needs redirection back to topic  Attention-seeking     Activity Level:  Patient tolerated full treatment session, remaining seated at table with peers entire time  Within functional limits    Concentration:  Distractable    Comments:  Appeared receptive to information  Attentive to group activity/discussion  Engaged  Good participation   Was able to relate several Social Supports: "Family, brothers, friends, church."  Stated family listens to him and assists tangibly with finances and food.  Stated he enjoys "hanging out" with friends and gets "emotional support" from them.  Stated he receives "work assistance" from church and "a sense of belonging.".    Admitted that his paranoia is a barrier to prevent him from fully utilizing these supports.  Was given positive feedback for insight into this.  Stated "Openness" can assist him overcoming his paranoia with each of these supports.    GOALS: Participate without reference to hallucinations 25% OT activities: Not mentioned in today's OT group.  Participate without reference to paranoia 25% OT    Plan: OT 5x/week for self management    Recommend: continued education in symptom management and community based resources    Therapist: Graylin Shiver, OT  Date: 06/20/2017

## 2017-06-20 NOTE — Care Coordination-Inpatient
Psychiatry Insurance Review No. 1  Insurance Company: BC/BS           Insurance/Medicaid ID #: [id number]     Reference /Authorization # [id number]                                    Insurance Care Advocate: Fleet Contras  Call to Racheal LVM "client discharging today,   will not need extra hospital days. Call if questions"    Racheal returned call arrangements made for physician review  for one day 8/20.     Discussed with Dr Theodoro Clock, then I called Judeth Cornfield [employer],   LVM "Dr Theodoro Clock would take a call tomorrow AM at # [phone]".     Physician to physician review, planned 8/22.   LCD : 8/19, date 8/20 in question.

## 2017-06-20 NOTE — Care Coordination-Inpatient
Psychiatry Insurance Review No. 1  Insurance Company: BC/BS           Insurance/Medicaid ID #: [id number]     Reference /Authorization # [id number]                                    Insurance Care Advocate: Fleet Contras  Outcome Denied, to physician to physician review. LCD : 8/19    Next Review : pending.

## 2017-06-21 ENCOUNTER — Encounter: Admit: 2017-06-15 | Discharge: 2017-06-21 | Payer: BC Managed Care – HMO

## 2017-06-21 DIAGNOSIS — F309 Manic episode, unspecified: ICD-10-CM

## 2017-06-21 DIAGNOSIS — Z915 Personal history of self-harm: ICD-10-CM

## 2017-06-21 DIAGNOSIS — R45850 Homicidal ideations: ICD-10-CM

## 2017-06-21 DIAGNOSIS — F250 Schizoaffective disorder, bipolar type: Principal | ICD-10-CM

## 2017-06-21 DIAGNOSIS — Z79899 Other long term (current) drug therapy: ICD-10-CM

## 2017-06-21 DIAGNOSIS — I429 Cardiomyopathy, unspecified: ICD-10-CM

## 2017-06-21 NOTE — Progress Notes
Discharge notice submitted via [employer] on [employer] site    Member Name: Jamie [name]  Member Id: [id number]    Submission ID: [id number]

## 2017-06-21 NOTE — Care Coordination-Inpatient
Call from [employer]. [place] stated that pt's day on 06/19/17 that was denied. She stated that they attempted to do a peer-to-peer review, but it was unsuccessful. That day has went to retro review. She requested that days 8/20 and 8/21 be faxed to [facility] at [phone] for review.

## 2017-06-22 ENCOUNTER — Encounter: Admit: 2017-06-22 | Discharge: 2017-06-22

## 2017-06-22 DIAGNOSIS — I428 Other cardiomyopathies: ICD-10-CM

## 2017-06-22 DIAGNOSIS — F250 Schizoaffective disorder, bipolar type: Principal | ICD-10-CM

## 2017-06-22 NOTE — Progress Notes
Date of Service: 06/22/2017    Jamie [name] is a 21 y.o. male.       HPI     It is my pleasure to see Jamie [name], 21 year old male who was seen in the heart failure clinic in May, 2018 after he had an episode of possibly viral myocarditis in the setting of concomitant use of clozapine for his schizoaffective disorder.  He has history of jumping off the bridge and overdose of medications.  ???  He was hospitalized on January 22 with flulike symptoms, he was initiated on clozapine few days prior, it was thought to be related to clozapine induced myocarditis as his flu symptoms were negative however he did have fever and diarrhea.  Rapid flu test was negative.  He had an echocardiogram which showed EF of 25% LV size of 6.2 cm, further MRI showed some evidence of myocarditis, his his ejection fraction improved to 55% for 5 days later after discontinuation of clozapine and his LV size was normal.  ???  He was initiated on goal-directed heart failure therapy with Coreg of 6.25 mg twice daily, lisinopril of 2.5 mg daily and spironolactone of 25 mg daily in addition to his schizoaffective disorder medications.  He comes back in the clinic on 2 occasions previously, he comes with repeat echocardiogram today which showed EF of 55-60% with normal size ventricle and normal RV function he reports no other symptoms no palpitation no dizziness lightheadedness or chest pain.  He is asking if he can come off his goal-directed heart failure therapy medications.  ???  Jamie [name] was seen in the clinic after 3 months, we had a treadmill exercise stress testing which showed 10.1 metastases without any arrhythmias, he exercised for 9 minutes 12 seconds, his blood pressure response was normal.  He had normal chronotropic response.  This is reassuring with his improving ejection fraction of 50%.  He had most recently Hospitalized for mania And Currently on Depakote.  He Has No Cardiovascular Issues, He Has No Symptoms, Is Unlimited Exercise Tolerance, He Has Lost 30 Pounds and Feeling Better.       Vitals:    06/22/17 0922   BP: 130/70   Pulse: 86   SpO2: 98%   Weight: 104.3 kg (230 lb)   Height: 1.727 m (5' 8)     Body mass index is 34.97 kg/m???.     Past Medical History  Patient Active Problem List    Diagnosis Date Noted   ??? Schizoaffective disorder, bipolar type (HCC) 06/15/2017   ??? Essential hypertension 01/06/2017   ??? Cardiomyopathy, idiopathic (HCC) 12/05/2016   ??? 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 Encounter for long-term (current) use of medications 11/21/2016   ??? Obesity (BMI 35.0-39.9 without comorbidity) 11/18/2016   ??? GERD (gastroesophageal reflux disease) 11/18/2016   ??? Paranoid schizophrenia (HCC) 08/23/2016         Review of Systems   Constitution: Negative.   HENT: Negative.    Eyes: Negative.    Cardiovascular: Negative.    Respiratory: Negative.    Endocrine: Negative.    Hematologic/Lymphatic: Negative.    Skin: Negative.    Musculoskeletal: Negative.    Gastrointestinal: Negative.    Genitourinary: Negative.    Neurological: Negative.    Psychiatric/Behavioral: Negative.    Allergic/Immunologic: Negative.    All other systems reviewed and are negative.      Physical Exam  General Appearance: no acute distress   Neck Veins: Neck veins are not distended  Extremities: Normal bilateral pedal pulses.No pedal edema.  Carotid Arteries: normal carotid upstroke bilaterally, no bruits   Cardiac Rhythm: regular rhythm and normal rate   Cardiac Auscultation: Normal S1 & S2, no S3 or S4, no rub, no cardiac murmurs   Chest Inspection: chest is normal in appearance   Auscultation/Percussion: lungs clear to auscultation, no rales, rhonchi    Skin: warm, moist, no ulcers   HENT: Moist oral mucosa.   Eyes; Pupils are round and reactive. No icterus  Abdominal Exam: soft, non-tender, no masses, bowel sounds normal no organomegaly   Neurologic Exam: neurological assessment grossly intact Cardiovascular Studies July, 2018  Treadmill stress, EKG:  1. Patient exercised for 9 min and 12 seconds on a Bruce protocol.   2. Patient achieved 100% max pred HR and 10.1 METS  3. Patient experienced symptoms of shortness of breath in resting phase but was without angina or equivalent  4. No arrhythmias were present during rest, exercise or recovery period  5. Overall non-ischemic stress electrocardiogram.  ???  CONCLUSION  No evidence of ischemia/arrhythmia on stress test. Good exercise capacity. Normal chronotropic response to exercise and rest.   ???    Problems Addressed Today  No diagnosis found.    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  History of schizoaffective disorder with history of attempted suicide  3.  Elevated BMI  4.  Hypertension         Reassuring as he has no arrhythmias PVCs and normal blood pressure response testing he was able to do 10. on treadmill.  He has lost 30 pounds, he is taking all his medications, he has no cardiac symptoms, no evidence of heart failure.  We will see him back in 6 months to make sure he has not stopped taking his medications I explained to him that his hypertension may be contributing some of the cardiac dysfunction therefore it is important to control it.  He will follow-up with psychiatry regarding his manic episodes as he had pressured speech and a lot of hypothetical situation..          Current Medications (including today's revisions)  ??? benztropine (COGENTIN) 0.5 mg tablet Take 1 tablet by mouth twice daily as needed.   ??? carvedilol (COREG) 3.125 mg tablet Take 2 tablets by mouth twice daily. Take with food.   ??? cholecalciferol (VITAMIN D-3) 1,000 units tablet Take one tablet by mouth daily.   ??? divalproex (DEPAKOTE ER) 500 mg ER tablet Take four tablets by mouth at bedtime daily. Take with food.   ??? lisinopril (PRINIVIL, ZESTRIL) 2.5 mg tablet Take 1 tablet by mouth daily.   ??? melatonin 5 mg tab Take 5 mg by mouth at bedtime as needed.   ??? paliperidone ER(+) (INVEGA) 6 mg tablet Take two tablets by mouth daily.   ??? spironolactone (ALDACTONE) 25 mg tablet Take 1 tablet by mouth daily with food.

## 2017-06-23 ENCOUNTER — Ambulatory Visit: Admit: 2017-06-22 | Discharge: 2017-06-23 | Payer: BC Managed Care – HMO

## 2017-06-23 DIAGNOSIS — I10 Essential (primary) hypertension: ICD-10-CM

## 2017-06-23 DIAGNOSIS — I5022 Chronic systolic (congestive) heart failure: Principal | ICD-10-CM

## 2017-06-30 ENCOUNTER — Encounter: Admit: 2017-06-30 | Discharge: 2017-06-30

## 2017-06-30 ENCOUNTER — Ambulatory Visit: Admit: 2017-06-30 | Discharge: 2017-06-30 | Payer: BC Managed Care – HMO

## 2017-06-30 DIAGNOSIS — F200 Paranoid schizophrenia: Principal | ICD-10-CM

## 2017-06-30 DIAGNOSIS — F250 Schizoaffective disorder, bipolar type: Principal | ICD-10-CM

## 2017-06-30 MED ORDER — DIVALPROEX 500 MG PO TB24
2000 mg | ORAL_TABLET | Freq: Every evening | ORAL | 2 refills | 30.00000 days | Status: AC
Start: 2017-06-30 — End: 2017-10-02

## 2017-06-30 MED ORDER — PALIPERIDONE 6 MG PO TR24
12 mg | ORAL_TABLET | Freq: Every day | ORAL | 2 refills | 28.00000 days | Status: AC
Start: 2017-06-30 — End: 2017-08-16

## 2017-06-30 NOTE — Progress Notes
Date of Service: 06/30/2017     Subjective:             Jamie [name] is a 21 y.o. male with schizophrenia and hx clozapine-induced myocarditis. Multiple past hospitalizations and 2 prior suicide attempts (jumped off bridge and overdosed on antipsychotic around age 78).    History of Present Illness  Last seen 04/2017 and no changes made, hospitalized 8/16-21 for psychosis with SI/HI and Invega was increased from 9 to 12mg  daily.     Presents alone today, reports father had to work.    Reports he has been doing fine since hospital discharge, disappointed that he lost his job and frustrated with his father for not calling them ahead of time to avoid the termination. Thinks he may look into getting another job in October, and take some time off for now. Recently started taking a class, hasn't done so well the first week but thinks he can do better for here on. Sleeping well, energy/concentration fair, appetite good. Tolerating medications well. Denies AH/VH but continues to believe there was an aerosolized hallucinogen that triggered his psychotic symptoms leading to hospitalization (see below). Since discharge has noticed that when he looks at the stars, that some are different colors that others wouldn't see, but denies other illusions. Also reports that his arm has electricity in it when he wakes up after sleeping in certain positions. He denies SI/HI.    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 He told his supervisor that he needed to stay away from the dishwasher and was sent home early, that night felt wide awake and had recurrent thoughts of demons/paranoia, tried to play video games but noticed they were somewhat different and he wasn't playing as well, the next day made an urgent clinic appt because he knew something was off. Denied missing any medications. Reports frustration at having to be hospitalized and said he was never really suicidal or homicidal and this was misinterpreted. Does acknowledge with time that he was having some manic/delusional symptoms however, and that his safety was the most important factor in decision to hospitalize.    Social update:  Parents (father and stepmother) are legal guardians.  Approved for disability 2018.  Does not have car access or insurance due to impulsive behavior and concern for safety.  Lives with his brothers in an apartment.   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  Denies tobacco or alcohol use.  Denies drug use.         Review of Systems   Constitutional: Negative for appetite change and unexpected weight change.   Respiratory: Negative for shortness of breath.    Cardiovascular: Negative for chest pain.   Gastrointestinal: Negative for constipation, diarrhea, nausea and vomiting.   Neurological: Negative for dizziness and syncope.         Objective:         ??? benztropine (COGENTIN) 0.5 mg tablet Take 1 tablet by mouth twice daily as needed.   ??? carvedilol (COREG) 3.125 mg tablet Take 2 tablets by mouth twice daily. Take with food.   ??? cholecalciferol (VITAMIN D-3) 1,000 units tablet Take one tablet by mouth daily.   ??? divalproex (DEPAKOTE ER) 500  mg ER tablet Take four tablets by mouth at bedtime daily. Take with food.   ??? lisinopril (PRINIVIL, ZESTRIL) 2.5 mg tablet Take 1 tablet by mouth daily.   ??? melatonin 5 mg tab Take 5 mg by mouth at bedtime as needed.   ??? paliperidone ER(+) (INVEGA) 6 mg tablet Take two tablets by mouth daily.   ??? spironolactone (ALDACTONE) 25 mg tablet Take 1 tablet by mouth daily with food.     Vitals:    06/30/17 0822   BP: (!) 118/98   Pulse: 89 Weight: 108.6 kg (239 lb 6.4 oz)     Body mass index is 36.4 kg/m???.     Physical Exam   Psychiatric:   Mental Status Evaluation:???     General/Constitutional: white man, causal attire, good hygiene  Speech: normal RRVT   Behavior: cooperative, calm  Thought Process: linear, goal oriented  Thought Content: Denies SI/HI, + delusions   Perception: Denies AH/VH, does not appear to be responding to internal stimuli  Associations: fair  Mood: fine  Affect: euthymic  Insight/Judgement: fair/fair    Orientation: oriented x4  Recent and remote memory: intact  Attention span and concentration: average  Language: [employer] of knowledge and vocabulary: average    Focused Physical Exam:    Gait: Normal        Assessment and Plan:  Schizophrenia   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, following with cardiology  GERD, Obesity   Disabled, parents are legal guardians  Supportive family, no drug/alcohol/tobacco use, motivated to work, cannot drive     Previous med trials: Lithium (bradycardia), Haldol (dystonia, oversedation; likely dose related; patient very reluctant to try again but has tolerated since), Geodon (ineffective), Seroquel (uncontrolled sx), Risperdal (stable for 6-48mos but then relapsed despite compliance, also weight gain), clozapine (myocarditis)    - Cont Invega 12mg  daily for psychosis   - AIMS 0    - Metabolic labs completed 02/2017  - Cont Depakote ER 2000mg  qHS for mood   - Level 54 on 8/20  - Cont Cogentin 0.5mg  BID PRN muscle spasms/rigidity  - Will re-message Dr. Lowry Bowl regarding therapy   - Continues to follow with cardiology     Discussed resources available in crisis including 911, ED, physician on call    RTC 1 months or PRN    Denna Haggard, DO

## 2017-07-04 NOTE — Progress Notes
Opened chart to print and fax AVS to [employer].

## 2017-07-10 NOTE — Progress Notes
ATTENDING NOTE      Encounter Date: 06/30/2017    I saw and evaluated Jamie [name], discussed with Denna Haggard, DO and concur with the assessment and treatment plan.     PRESENTING PROBLEM AND BACKGROUND: Patient is 21 y.o. male with Schizoaffective disorder Bipolar type.    CURRENT TREATMENT AND RESPONSES:   Reports he is doing relatively well at this time.  Tolerating medications.    PLAN:  1. Continue Invega, Depakote, and Cogentin.  2. Will contact Dr. Lowry Bowl regarding therapy.

## 2017-08-04 ENCOUNTER — Encounter: Admit: 2017-08-04 | Discharge: 2017-08-04

## 2017-08-04 ENCOUNTER — Ambulatory Visit: Admit: 2017-08-04 | Discharge: 2017-08-05 | Payer: BC Managed Care – HMO

## 2017-08-04 DIAGNOSIS — F200 Paranoid schizophrenia: Principal | ICD-10-CM

## 2017-08-04 DIAGNOSIS — F250 Schizoaffective disorder, bipolar type: Principal | ICD-10-CM

## 2017-08-04 NOTE — Progress Notes
I have reviewed and discussed the patient's case with the resident at the time of the visit. I have reviewed the documentation of the visit and I agree with the resident's note. I agree with assessment and plan of care as discussed.

## 2017-08-04 NOTE — Progress Notes
Date of Service: 08/04/2017     Subjective:             Jamie [name] is a 21 y.o. male with schizophrenia and hx clozapine-induced myocarditis. Multiple past hospitalizations and 2 prior suicide attempts (jumped off bridge and overdosed on antipsychotic around age 70).    History of Present Illness  Last seen 05/2017 and no changes made.     Presents with his father.    Reports things have been going generally well.  He has had a few perceptual disturbances such as when walking down the street and thinking that buildings appear differently than they are, thinking his skin changes color at times, but reports when these episodes occur he is able to remind himself that there symptoms of his psychosis and he is able to ignore them.  Reports good compliance with medications and denies side effects.  Denies auditory hallucinations.  Denies depressed mood, anhedonia, SI/HI.    He is heavily considering moving from the apartment he shares with his 2 brothers, to an apartment that he has to himself in [place].  He has been having some conflicts with 1 of his brothers, and his other brother has a serious girlfriend that he and his father think he might marry.  He also recently had a job interview at [employer] and believes he will be offered the job, reports that if he starts working there that he could easily transfer to a different store in [place].      Social update:  Parents (father and stepmother) are legal guardians.  Approved for disability 2018.  Does not have car access or insurance due to impulsive behavior and concern for safety.  Lives with his brothers in an apartment.   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  Taking an online college course, going well.  Denies tobacco or alcohol use.  Denies drug use.         Review of Systems   Constitutional: Negative for appetite change and unexpected weight change. Respiratory: Negative for shortness of breath.    Cardiovascular: Negative for chest pain.   Gastrointestinal: Negative for constipation, diarrhea, nausea and vomiting.   Neurological: Negative for dizziness and syncope.         Objective:         ??? benztropine (COGENTIN) 0.5 mg tablet Take 1 tablet by mouth twice daily as needed.   ??? carvedilol (COREG) 3.125 mg tablet Take 2 tablets by mouth twice daily. Take with food.   ??? cholecalciferol (VITAMIN D-3) 1,000 units tablet Take one tablet by mouth daily.   ??? divalproex (DEPAKOTE ER) 500 mg ER tablet Take four tablets by mouth at bedtime daily. Take with food.   ??? lisinopril (PRINIVIL, ZESTRIL) 2.5 mg tablet Take 1 tablet by mouth daily.   ??? melatonin 5 mg tab Take 5 mg by mouth at bedtime as needed.   ??? paliperidone ER(+) (INVEGA) 6 mg tablet Take two tablets by mouth daily.   ??? spironolactone (ALDACTONE) 25 mg tablet Take 1 tablet by mouth daily with food.     There were no vitals filed for this visit.  There is no height or weight on file to calculate BMI.     Physical Exam   Psychiatric:   Mental Status Evaluation:???     General/Constitutional: white man, causal attire, good hygiene  Speech: normal RRVT   Behavior: cooperative, calm  Thought Process: linear, goal oriented  Thought Content: Denies SI/HI, + delusions   Perception: Denies AH/VH, does not appear to be responding to internal stimuli  Associations: fair  Mood: pretty good  Affect: euthymic  Insight/Judgement: fair/fair    Orientation: oriented x4  Recent and remote memory: intact  Attention span and concentration: average  Language: [employer] of knowledge and vocabulary: average    Focused Physical Exam:    Gait: Normal        Assessment and Plan:  Schizophrenia   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, following with cardiology  GERD, Obesity   Disabled, parents are legal guardians  Supportive family, no drug/alcohol/tobacco use, motivated to work, cannot drive Previous med trials: Lithium (bradycardia), Haldol (dystonia, oversedation; likely dose related; patient very reluctant to try again but has tolerated since), Geodon (ineffective), Seroquel (uncontrolled sx), Risperdal (stable for 6-59mos but then relapsed despite compliance, also weight gain), clozapine (myocarditis)    - Cont Invega 12mg  daily for psychosis   - AIMS 0    - Metabolic labs completed 02/2017  - Cont Depakote ER 2000mg  qHS for mood   - Level 54 on 8/20  - Cont Cogentin 0.5mg  BID PRN muscle spasms/rigidity  - Will re-message Dr. Lowry Bowl regarding therapy   - Continues to follow with cardiology     Discussed resources available in crisis including 911, ED, physician on call    RTC 2 months or PRN    Denna Haggard, DO

## 2017-08-16 ENCOUNTER — Encounter: Admit: 2017-08-16 | Discharge: 2017-08-16

## 2017-08-16 DIAGNOSIS — F200 Paranoid schizophrenia: Principal | ICD-10-CM

## 2017-08-16 MED ORDER — PALIPERIDONE 6 MG PO TR24
12 mg | ORAL_TABLET | Freq: Every day | ORAL | 2 refills | 28.00000 days | Status: AC
Start: 2017-08-16 — End: 2017-10-02

## 2017-09-15 ENCOUNTER — Encounter: Admit: 2017-09-15 | Discharge: 2017-09-15

## 2017-09-15 NOTE — Telephone Encounter
Called pt to f/u. Reports "weird things" have been going on and he's been having feelings of male sexuality, believes he has male erogenous tissue and had a "male orgasm" last week; believes he has a clitoris that's not visible and is under his skin. Reports he's always had a sense that he might have a clitoris but now feels certain about it. Reports he is not concerned about it especially and is not questioning his sexuality. Also reports he thinks that Depakote makes his body asymmetric and one side is usually bigger than the other.     He said he has never had a doctor tell him he has ambiguous genitalia or male reproductive parts. Reviewed that both men and women have erogenous tissue in the reproductive area and this does not mean that he has male reproductive parts, and recommended he schedule a follow up with his PCP to discuss his physical/genital concerns and to obtain a physical exam, and he thought this was a good idea and agreed. Reviewed with him that he has never appeared asymmetric in appearance to me. Reports he has been compliant with all meds and tolerating them fine, reports he's been trying to lose weight and is working out more, reports things are going well in his life at this time and denies current stressors. He denied SI/HI, denies impulsive thoughts of travel. Reviewed f/u appt 12/3 and he agreed to call with any questions/concerns.  Reported feeling much better at end of conversation and thanked this Clinical research associate for being able to discuss the concerning thoughts he was having.     Denna Haggard, DO

## 2017-10-02 ENCOUNTER — Encounter: Admit: 2017-10-02 | Discharge: 2017-10-02

## 2017-10-02 ENCOUNTER — Ambulatory Visit: Admit: 2017-10-02 | Discharge: 2017-10-02 | Payer: BC Managed Care – HMO

## 2017-10-02 DIAGNOSIS — F200 Paranoid schizophrenia: Principal | ICD-10-CM

## 2017-10-02 DIAGNOSIS — F250 Schizoaffective disorder, bipolar type: Principal | ICD-10-CM

## 2017-10-02 MED ORDER — PALIPERIDONE 6 MG PO TR24
12 mg | ORAL_TABLET | Freq: Every day | ORAL | 2 refills | 28.00000 days | Status: AC
Start: 2017-10-02 — End: 2017-12-05

## 2017-10-02 MED ORDER — DIVALPROEX 500 MG PO TB24
2000 mg | ORAL_TABLET | Freq: Every evening | ORAL | 2 refills | 30.00000 days | Status: AC
Start: 2017-10-02 — End: 2017-12-05

## 2017-10-02 NOTE — Progress Notes
Date of Service: 10/02/2017     Subjective:             Jamie [name] is a 21 y.o. male with schizophrenia and hx clozapine-induced myocarditis. Multiple past hospitalizations and 2 prior suicide attempts (jumped off bridge and overdosed on antipsychotic around age 61).    History of Present Illness  Last seen 05/2017 and no changes made.     Presents with his father, who joined at end of visit.    Bravlio reports things are going better than they have in a long time. 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 Sleeping a little less, about 4-5 hours per night, sometimes takes a 1 hr nap during the day if needed, but energy has been fairly good. Denies depressed mood, anhedonia, SI/HI. Denies AH but continues to have somatic delusions about his body changing colors, having male genital tissue underneath his skin, having magic healing powers, and not being human. Reports these things don't bother him to the extent that it impairs functioning, he just feels different from other people. He has a hard time believing doctors telling him these things are only in his mind. Reports frustration related to having schizophrenia, having to take medications, having to have his parents be his guardians and not having the freedoms his peers do. Reviewed goal of preventing psychotic breaks with medications, which can cause damage to the brain, and he was receptive to this.    He's been working out regularly at [employer] and has lost almost 15 lbs.    His father reports he is overall doing well, but sometimes becomes irritable and sometimes they have trouble getting ahold of him. Planning to buy him a new phone to help with this.    Social update:  Parents (father and stepmother) are legal guardians.  Approved for disability 2018.  Does not have car access or insurance due to impulsive behavior and concern for safety. Lives in his own apartment in [place] (recently moved from sharing apartment with his brothers).  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  Taking an online college course, going well.  Denies tobacco or alcohol use.  Denies drug use.         Review of Systems   Constitutional: Negative for fatigue.   Respiratory: Negative for shortness of breath.    Cardiovascular: Negative for chest pain.   Gastrointestinal: Negative for constipation, diarrhea, nausea and vomiting.   Neurological: Negative for dizziness and weakness.         Objective:         ??? benztropine (COGENTIN) 0.5 mg tablet Take 1 tablet by mouth twice daily as needed.   ??? carvedilol (COREG) 3.125 mg tablet Take 2 tablets by mouth twice daily. Take with food.   ??? cholecalciferol (VITAMIN D-3) 1,000 units tablet Take one tablet by mouth daily.   ??? divalproex (DEPAKOTE ER) 500 mg ER tablet Take four tablets by mouth at bedtime daily. Take with food.   ??? lisinopril (PRINIVIL, ZESTRIL) 2.5 mg tablet Take 1 tablet by mouth daily.   ??? melatonin 5 mg tab Take 5 mg by mouth at bedtime as needed.   ??? paliperidone ER(+) (INVEGA) 6 mg tablet Take two tablets by mouth daily.   ??? spironolactone (ALDACTONE) 25 mg tablet Take 1 tablet by mouth daily with food.     Vitals:  10/02/17 1531   BP: 128/79   Pulse: 58   Weight: 100.7 kg (222 lb)     Body mass index is 33.75 kg/m???.     Physical Exam   Psychiatric:   Mental Status Evaluation:???     General/Constitutional: white man, causal attire, good hygiene  Speech: normal RRVT   Behavior: cooperative, calm  Thought Process: linear, goal oriented  Thought Content: Denies SI/HI, + delusions   Perception: Denies AH/VH, does not appear to be responding to internal stimuli  Associations: fair  Mood: pretty good  Affect: euthymic  Insight/Judgement: fair/fair    Orientation: oriented x4  Recent and remote memory: intact  Attention span and concentration: average Language: [employer] of knowledge and vocabulary: average    Focused Physical Exam:    Gait: Normal        Assessment and Plan:  Schizophrenia   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, following with cardiology  GERD, Obesity   Disabled, parents are legal guardians  Supportive family, no drug/alcohol/tobacco use, motivated to work, cannot drive     Previous med trials: Lithium (bradycardia), Haldol (dystonia, oversedation; likely dose related; patient very reluctant to try again but has tolerated since), Geodon (ineffective), Seroquel (uncontrolled sx), Risperdal (stable for 6-51mos but then relapsed despite compliance, also weight gain), clozapine (myocarditis)    - Cont Invega 12mg  daily for psychosis   - AIMS 0    - Metabolic labs completed 02/2017  - Cont Depakote ER 2000mg  qHS for mood   - Level 54 and platelets normal 05/2017  - Cont Cogentin 0.5mg  BID PRN muscle spasms/rigidity  - Supportive therapy provided  - Will begin therapy with Dr. Lowry Bowl tomorrow 12/4  - Continues to follow with cardiology     Discussed resources available in crisis including 911, ED, physician on call    RTC 2 months or PRN    Denna Haggard, DO

## 2017-10-02 NOTE — Progress Notes
ATTENDING NOTE      Encounter Date: 10/02/2017    I saw and evaluated Jamie [name], discussed with Denna Haggard, DO and concur with the assessment and treatment plan.     PRESENTING PROBLEM AND BACKGROUND: Patient is 21 y.o. male with diagnosis bipolar vs schizoaffective disorder hospitalized Feb 2017 and April 2017 for depression with suicidal ideation and in August 2016 and Jan 2018  for psychotic manic episode. Patient started on clozapine during January 2018 hospitalization but developed myocarditis and clozapine discontinued. Cardiac status returned to baseline and patient started on paliperidone along with divalproex. His father is his legal guardian. He recently moved into his own apartment and in regular contact with father. Last visit patient stable and medications and doses unchanged.     CURRENT TREATMENT AND RESPONSES: Patient and father report relative stability since last visit. He began job 1 day/wk and his first day went well. In session with Dr. Shary Key he expressed continuing delusional ideation. He does not like taking medication but indicates he is taking medication as prescribed. Father reports no unusual behavior. Patient and father agreeable to continuing on current medications at current doses. Patient to start therapy with Dr. Lowry Bowl.    PLAN:  1. Continue paliperidone 12 mg qd, divalproex 2000 mg qhs, and benztropine 0.5 mg bid  2. Divalproex lab today including LFTs and ammonia  3. Monitor affect, psychosis, suicidal ideation, AIMS, metabolic syndrome labs

## 2017-10-03 ENCOUNTER — Ambulatory Visit: Admit: 2017-10-03 | Discharge: 2017-10-04 | Payer: BC Managed Care – HMO

## 2017-10-03 DIAGNOSIS — F250 Schizoaffective disorder, bipolar type: Principal | ICD-10-CM
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
## 2017-10-26 ENCOUNTER — Ambulatory Visit: Admit: 2017-10-26 | Discharge: 2017-10-26 | Payer: BC Managed Care – HMO

## 2017-10-26 ENCOUNTER — Encounter: Admit: 2017-10-26 | Discharge: 2017-10-26

## 2017-10-26 DIAGNOSIS — F250 Schizoaffective disorder, bipolar type: Principal | ICD-10-CM

## 2017-10-26 MED ORDER — CARVEDILOL 3.125 MG PO TAB
6.25 mg | ORAL_TABLET | Freq: Two times a day (BID) | ORAL | 1 refills | Status: SS
Start: 2017-10-26 — End: 2019-05-05

## 2017-11-07 ENCOUNTER — Ambulatory Visit: Admit: 2017-11-07 | Discharge: 2017-11-07 | Payer: Medicaid Other

## 2017-11-07 DIAGNOSIS — F250 Schizoaffective disorder, bipolar type: Principal | ICD-10-CM

## 2017-12-04 ENCOUNTER — Encounter: Admit: 2017-12-04 | Discharge: 2017-12-04

## 2017-12-04 ENCOUNTER — Ambulatory Visit: Admit: 2017-12-04 | Discharge: 2017-12-04 | Payer: Medicaid Other

## 2017-12-04 DIAGNOSIS — F250 Schizoaffective disorder, bipolar type: Principal | ICD-10-CM

## 2017-12-05 ENCOUNTER — Ambulatory Visit: Admit: 2017-12-05 | Discharge: 2017-12-06 | Payer: Medicaid Other

## 2017-12-05 ENCOUNTER — Encounter: Admit: 2017-12-05 | Discharge: 2017-12-05

## 2017-12-05 DIAGNOSIS — F250 Schizoaffective disorder, bipolar type: Principal | ICD-10-CM

## 2017-12-05 DIAGNOSIS — F200 Paranoid schizophrenia: Principal | ICD-10-CM

## 2017-12-05 MED ORDER — PALIPERIDONE 6 MG PO TR24
12 mg | ORAL_TABLET | Freq: Every day | ORAL | 2 refills | 28.00000 days | Status: AC
Start: 2017-12-05 — End: 2018-01-23

## 2017-12-05 MED ORDER — DIVALPROEX 500 MG PO TB24
2000 mg | ORAL_TABLET | Freq: Every evening | ORAL | 2 refills | 30.00000 days | Status: AC
Start: 2017-12-05 — End: 2018-01-23

## 2018-01-01 ENCOUNTER — Encounter: Admit: 2018-01-01 | Discharge: 2018-01-01

## 2018-01-01 DIAGNOSIS — B3322 Viral myocarditis: ICD-10-CM

## 2018-01-01 DIAGNOSIS — F250 Schizoaffective disorder, bipolar type: Principal | ICD-10-CM

## 2018-01-02 ENCOUNTER — Ambulatory Visit: Admit: 2018-01-01 | Discharge: 2018-01-02 | Payer: Medicaid Other

## 2018-01-02 DIAGNOSIS — I428 Other cardiomyopathies: ICD-10-CM

## 2018-01-02 DIAGNOSIS — I5022 Chronic systolic (congestive) heart failure: Principal | ICD-10-CM

## 2018-01-02 DIAGNOSIS — K219 Gastro-esophageal reflux disease without esophagitis: ICD-10-CM

## 2018-01-16 ENCOUNTER — Ambulatory Visit: Admit: 2018-01-16 | Discharge: 2018-01-17 | Payer: Medicaid Other

## 2018-01-16 DIAGNOSIS — F250 Schizoaffective disorder, bipolar type: Principal | ICD-10-CM

## 2018-01-23 ENCOUNTER — Ambulatory Visit: Admit: 2018-01-23 | Discharge: 2018-01-24 | Payer: Medicaid Other

## 2018-01-23 ENCOUNTER — Encounter: Admit: 2018-01-23 | Discharge: 2018-01-23

## 2018-01-23 DIAGNOSIS — F250 Schizoaffective disorder, bipolar type: Principal | ICD-10-CM

## 2018-01-23 DIAGNOSIS — F200 Paranoid schizophrenia: Principal | ICD-10-CM

## 2018-01-23 MED ORDER — DIVALPROEX 500 MG PO TB24
2000 mg | ORAL_TABLET | Freq: Every evening | ORAL | 2 refills | 30.00000 days | Status: AC
Start: 2018-01-23 — End: 2018-04-17

## 2018-01-23 MED ORDER — PALIPERIDONE 6 MG PO TR24
12 mg | ORAL_TABLET | Freq: Every day | ORAL | 2 refills | 28.00000 days | Status: AC
Start: 2018-01-23 — End: 2018-04-17

## 2018-03-12 ENCOUNTER — Ambulatory Visit: Admit: 2018-03-12 | Discharge: 2018-03-13 | Payer: Medicaid Other

## 2018-03-12 DIAGNOSIS — F250 Schizoaffective disorder, bipolar type: Principal | ICD-10-CM

## 2018-04-17 ENCOUNTER — Ambulatory Visit: Admit: 2018-04-17 | Discharge: 2018-04-18

## 2018-04-17 ENCOUNTER — Encounter: Admit: 2018-04-17 | Discharge: 2018-04-17

## 2018-04-17 DIAGNOSIS — F250 Schizoaffective disorder, bipolar type: Principal | ICD-10-CM

## 2018-04-17 MED ORDER — PALIPERIDONE 6 MG PO TR24
12 mg | ORAL_TABLET | Freq: Every day | ORAL | 2 refills | 28.00000 days | Status: AC
Start: 2018-04-17 — End: 2018-05-18

## 2018-04-17 MED ORDER — DIVALPROEX 500 MG PO TB24
2000 mg | ORAL_TABLET | Freq: Every evening | ORAL | 2 refills | 30.00000 days | Status: AC
Start: 2018-04-17 — End: 2018-05-18

## 2018-04-18 DIAGNOSIS — F200 Paranoid schizophrenia: Principal | ICD-10-CM

## 2018-04-18 DIAGNOSIS — Z79899 Other long term (current) drug therapy: ICD-10-CM

## 2018-05-07 ENCOUNTER — Ambulatory Visit: Admit: 2018-05-07 | Discharge: 2018-05-08 | Payer: Private Health Insurance - Indemnity

## 2018-05-07 DIAGNOSIS — F250 Schizoaffective disorder, bipolar type: Principal | ICD-10-CM

## 2018-05-11 ENCOUNTER — Encounter: Admit: 2018-05-11 | Discharge: 2018-05-11

## 2018-05-11 DIAGNOSIS — F250 Schizoaffective disorder, bipolar type: Principal | ICD-10-CM

## 2018-05-11 DIAGNOSIS — R441 Visual hallucinations: ICD-10-CM

## 2018-05-11 DIAGNOSIS — F29 Unspecified psychosis not due to a substance or known physiological condition: ICD-10-CM

## 2018-05-12 ENCOUNTER — Encounter: Admit: 2018-05-12 | Discharge: 2018-05-18 | Payer: Private Health Insurance - Indemnity

## 2018-05-12 ENCOUNTER — Encounter: Admit: 2018-05-12 | Discharge: 2018-05-12

## 2018-05-12 DIAGNOSIS — F250 Schizoaffective disorder, bipolar type: Principal | ICD-10-CM

## 2018-05-12 LAB — AMPHETAMINES-URINE RANDOM: Lab: NEGATIVE mg/dL (ref 70–100)

## 2018-05-12 LAB — SALICYLATE LEVEL

## 2018-05-12 LAB — TSH WITH FREE T4 REFLEX: Lab: 1.4 uU/mL (ref 0.35–5.00)

## 2018-05-12 LAB — CBC AND DIFF
Lab: 0 10*3/uL (ref 0–0.20)
Lab: 0.1 10*3/uL (ref 0–0.45)
Lab: 7.5 10*3/uL (ref 4.5–11.0)

## 2018-05-12 LAB — OXYCODONE URINE SCREEN: Lab: NEGATIVE U/L (ref 25–110)

## 2018-05-12 LAB — PHENCYCLIDINES-URINE RANDOM: Lab: NEGATIVE mg/dL (ref 0.3–1.2)

## 2018-05-12 LAB — COMPREHENSIVE METABOLIC PANEL
Lab: 137 MMOL/L (ref 137–147)
Lab: 23 MMOL/L (ref 21–30)
Lab: >60 mL/min (ref >60)

## 2018-05-12 LAB — TRICYCLIC SCREEN: Lab: NEGATIVE mL/min (ref 1.0–4.8)

## 2018-05-12 LAB — ALCOHOL LEVEL: Lab: <10 mg/dL (ref 98–110)

## 2018-05-12 LAB — ACETAMINOPHEN LEVEL: Lab: <10 ug/mL (ref <20.1)

## 2018-05-12 LAB — METHADONE-URINE SCREEN: Lab: NEGATIVE g/dL (ref 3.5–5.0)

## 2018-05-12 LAB — CANNABINOIDS-URINE RANDOM: Lab: NEGATIVE mg/dL (ref 8.5–10.6)

## 2018-05-12 LAB — BENZODIAZEPINES-URINE RANDOM: Lab: NEGATIVE mg/dL (ref 0.4–1.24)

## 2018-05-12 LAB — OPIATES 300 OR GREATER-URINE RANDOM: Lab: NEGATIVE U/L (ref 7–40)

## 2018-05-12 LAB — BARBITURATES-URINE RANDOM: Lab: NEGATIVE mg/dL (ref 7–25)

## 2018-05-12 LAB — COCAINE-URINE RANDOM: Lab: NEGATIVE g/dL (ref 6.0–8.0)

## 2018-05-12 MED ORDER — DIVALPROEX 500 MG PO TB24
2000 mg | Freq: Every evening | ORAL | 0 refills | Status: DC
Start: 2018-05-12 — End: 2018-05-16
  Administered 2018-05-13 – 2018-05-16 (×4): 2000 mg via ORAL

## 2018-05-12 MED ORDER — BENZTROPINE 0.5 MG PO TAB
0.5 mg | Freq: Two times a day (BID) | ORAL | 0 refills | Status: DC | PRN
Start: 2018-05-12 — End: 2018-05-12

## 2018-05-12 MED ORDER — CHOLECALCIFEROL (VITAMIN D3) 1,000 UNIT PO TAB
1000 [IU] | Freq: Every day | ORAL | 0 refills | Status: DC
Start: 2018-05-12 — End: 2018-05-19
  Administered 2018-05-12 – 2018-05-18 (×7): 1000 [IU] via ORAL

## 2018-05-12 MED ORDER — RISPERIDONE 2 MG PO TAB
2 mg | Freq: Every evening | ORAL | 0 refills | Status: DC
Start: 2018-05-12 — End: 2018-05-12

## 2018-05-12 MED ORDER — OLANZAPINE 10 MG IM SOLR
5 mg | INTRAMUSCULAR | 0 refills | Status: DC | PRN
Start: 2018-05-12 — End: 2018-05-19

## 2018-05-12 MED ORDER — MELATONIN 3 MG PO TAB
3 mg | Freq: Every evening | ORAL | 0 refills | Status: DC
Start: 2018-05-12 — End: 2018-05-19
  Administered 2018-05-13 – 2018-05-18 (×6): 3 mg via ORAL

## 2018-05-12 MED ORDER — MELATONIN 5 MG PO TAB
5 mg | Freq: Every evening | ORAL | 0 refills | Status: DC | PRN
Start: 2018-05-12 — End: 2018-05-19

## 2018-05-12 MED ORDER — OLANZAPINE 5 MG PO TBDI
5 mg | ORAL | 0 refills | Status: DC | PRN
Start: 2018-05-12 — End: 2018-05-19

## 2018-05-12 MED ORDER — SPIRONOLACTONE 25 MG PO TAB
25 mg | Freq: Every day | ORAL | 0 refills | Status: DC
Start: 2018-05-12 — End: 2018-05-19
  Administered 2018-05-12 – 2018-05-18 (×7): 25 mg via ORAL

## 2018-05-12 MED ORDER — CARVEDILOL 6.25 MG PO TAB
6.25 mg | Freq: Two times a day (BID) | ORAL | 0 refills | Status: DC
Start: 2018-05-12 — End: 2018-05-19
  Administered 2018-05-12 – 2018-05-18 (×11): 6.25 mg via ORAL

## 2018-05-13 LAB — LIPID PROFILE: Lab: 189 mg/dL — ABNORMAL LOW (ref <200)

## 2018-05-13 LAB — HEMOGLOBIN A1C: Lab: 5.2 % (ref 4.0–6.0)

## 2018-05-13 MED ORDER — ACETAMINOPHEN 325 MG PO TAB
650 mg | ORAL | 0 refills | Status: DC | PRN
Start: 2018-05-13 — End: 2018-05-19

## 2018-05-14 MED ORDER — ARIPIPRAZOLE 10 MG PO TAB
5 mg | Freq: Every evening | ORAL | 0 refills | Status: DC
Start: 2018-05-14 — End: 2018-05-15
  Administered 2018-05-15: 03:00:00 5 mg via ORAL

## 2018-05-15 MED ORDER — ARIPIPRAZOLE 10 MG PO TAB
10 mg | Freq: Every morning | ORAL | 0 refills | Status: DC
Start: 2018-05-15 — End: 2018-05-15

## 2018-05-15 MED ORDER — ARIPIPRAZOLE 10 MG PO TAB
10 mg | Freq: Every morning | ORAL | 0 refills | Status: DC
Start: 2018-05-15 — End: 2018-05-19
  Administered 2018-05-15 – 2018-05-18 (×4): 10 mg via ORAL

## 2018-05-16 LAB — VALPROIC ACID LEVEL: Lab: 93 ug/mL (ref 50.0–100.0)

## 2018-05-16 MED ORDER — CALCIUM CARBONATE 200 MG CALCIUM (500 MG) PO CHEW
500 mg | Freq: Every day | ORAL | 0 refills | Status: DC | PRN
Start: 2018-05-16 — End: 2018-05-19

## 2018-05-16 MED ORDER — DIVALPROEX 500 MG PO TB24
1500 mg | Freq: Every evening | ORAL | 0 refills | Status: DC
Start: 2018-05-16 — End: 2018-05-19
  Administered 2018-05-17 – 2018-05-18 (×2): 1500 mg via ORAL

## 2018-05-18 DIAGNOSIS — Z9119 Patient's noncompliance with other medical treatment and regimen: ICD-10-CM

## 2018-05-18 DIAGNOSIS — Z915 Personal history of self-harm: ICD-10-CM

## 2018-05-18 DIAGNOSIS — I514 Myocarditis, unspecified: ICD-10-CM

## 2018-05-18 DIAGNOSIS — I119 Hypertensive heart disease without heart failure: ICD-10-CM

## 2018-05-18 DIAGNOSIS — F250 Schizoaffective disorder, bipolar type: Principal | ICD-10-CM

## 2018-05-18 DIAGNOSIS — R45851 Suicidal ideations: ICD-10-CM

## 2018-05-18 DIAGNOSIS — R45850 Homicidal ideations: ICD-10-CM

## 2018-05-18 DIAGNOSIS — G249 Dystonia, unspecified: ICD-10-CM

## 2018-05-18 MED ORDER — ARIPIPRAZOLE 10 MG PO TAB
10 mg | ORAL_TABLET | Freq: Every morning | ORAL | 0 refills | Status: AC
Start: 2018-05-18 — End: 2018-06-14

## 2018-05-18 MED ORDER — MELATONIN 3 MG PO TAB
3 mg | Freq: Every evening | ORAL | 0 refills | 28.00000 days | Status: AC
Start: 2018-05-18 — End: 2019-12-09

## 2018-05-18 MED ORDER — DIVALPROEX 500 MG PO TB24
1500 mg | ORAL_TABLET | Freq: Every evening | ORAL | 0 refills | 30.00000 days | Status: AC
Start: 2018-05-18 — End: 2018-05-25

## 2018-05-22 ENCOUNTER — Encounter: Admit: 2018-05-22 | Discharge: 2018-05-22

## 2018-05-23 ENCOUNTER — Encounter: Admit: 2018-05-23 | Discharge: 2018-05-23

## 2018-05-23 ENCOUNTER — Ambulatory Visit: Admit: 2018-05-23 | Discharge: 2018-05-24 | Payer: Private Health Insurance - Indemnity

## 2018-05-23 MED ORDER — ARIPIPRAZOLE 400 MG IM SERS
400 mg | INTRAMUSCULAR | 11 refills | Status: AC
Start: 2018-05-23 — End: 2019-02-05

## 2018-05-23 MED ORDER — ARIPIPRAZOLE 10 MG PO TAB
10 mg | ORAL_TABLET | Freq: Every morning | ORAL | 0 refills
Start: 2018-05-23 — End: ?

## 2018-05-23 MED ORDER — ARIPIPRAZOLE 400 MG IM SERS
400 mg | Freq: Once | INTRAMUSCULAR | 0 refills | Status: AC
Start: 2018-05-23 — End: ?

## 2018-05-23 MED ORDER — ARIPIPRAZOLE 400 MG IM SERS
400 mg | INTRAMUSCULAR | 0 refills | Status: AC
Start: 2018-05-23 — End: 2018-05-23

## 2018-05-24 ENCOUNTER — Encounter: Admit: 2018-05-24 | Discharge: 2018-05-24

## 2018-05-24 DIAGNOSIS — F250 Schizoaffective disorder, bipolar type: Principal | ICD-10-CM

## 2018-05-25 ENCOUNTER — Encounter: Admit: 2018-05-25 | Discharge: 2018-05-25

## 2018-05-25 MED ORDER — DIVALPROEX 500 MG PO TB24
1500 mg | ORAL_TABLET | Freq: Every evening | ORAL | 0 refills | 30.00000 days | Status: AC
Start: 2018-05-25 — End: 2018-08-20

## 2018-05-28 ENCOUNTER — Encounter: Admit: 2018-05-28 | Discharge: 2018-05-28

## 2018-06-05 ENCOUNTER — Encounter: Admit: 2018-06-05 | Discharge: 2018-06-05

## 2018-06-12 ENCOUNTER — Encounter: Admit: 2018-06-12 | Discharge: 2018-06-12

## 2018-06-12 MED ORDER — ARIPIPRAZOLE 10 MG PO TAB
10 mg | ORAL_TABLET | Freq: Every morning | ORAL | 0 refills | Status: CN
Start: 2018-06-12 — End: ?

## 2018-06-13 MED ORDER — ARIPIPRAZOLE 10 MG PO TAB
10 mg | ORAL_TABLET | Freq: Every morning | ORAL | 0 refills | Status: AC
Start: 2018-06-13 — End: 2018-06-18
  Filled 2018-06-14: qty 1, 28d supply

## 2018-06-14 ENCOUNTER — Encounter: Admit: 2018-06-14 | Discharge: 2018-06-14

## 2018-06-15 ENCOUNTER — Ambulatory Visit: Admit: 2018-06-15 | Discharge: 2018-06-16 | Payer: Private Health Insurance - Indemnity

## 2018-06-15 ENCOUNTER — Encounter: Admit: 2018-06-15 | Discharge: 2018-06-15

## 2018-06-16 DIAGNOSIS — F250 Schizoaffective disorder, bipolar type: Principal | ICD-10-CM

## 2018-06-18 ENCOUNTER — Ambulatory Visit: Admit: 2018-06-18 | Discharge: 2018-06-19 | Payer: Private Health Insurance - Indemnity

## 2018-06-18 ENCOUNTER — Encounter: Admit: 2018-06-18 | Discharge: 2018-06-18

## 2018-06-18 MED ORDER — ARIPIPRAZOLE 15 MG PO TAB
15 mg | ORAL_TABLET | Freq: Every day | ORAL | 2 refills | Status: AC
Start: 2018-06-18 — End: 2018-08-20

## 2018-06-18 MED ORDER — PALIPERIDONE PALMITATE 234 MG/1.5 ML IM SYRG
1.5 mL | INJECTION | INTRAMUSCULAR | 0 refills | 28.00000 days | Status: DC
Start: 2018-06-18 — End: 2018-06-18

## 2018-06-19 DIAGNOSIS — Z79899 Other long term (current) drug therapy: Secondary | ICD-10-CM

## 2018-06-19 DIAGNOSIS — F250 Schizoaffective disorder, bipolar type: Principal | ICD-10-CM

## 2018-06-19 DIAGNOSIS — E669 Obesity, unspecified: ICD-10-CM

## 2018-06-23 ENCOUNTER — Encounter: Admit: 2018-06-23 | Discharge: 2018-06-23

## 2018-07-03 ENCOUNTER — Ambulatory Visit: Admit: 2018-07-03 | Discharge: 2018-07-03

## 2018-07-03 ENCOUNTER — Encounter: Admit: 2018-07-03 | Discharge: 2018-07-03

## 2018-07-03 ENCOUNTER — Ambulatory Visit: Admit: 2018-07-03 | Discharge: 2018-07-04

## 2018-07-03 DIAGNOSIS — F250 Schizoaffective disorder, bipolar type: ICD-10-CM

## 2018-07-03 DIAGNOSIS — K219 Gastro-esophageal reflux disease without esophagitis: Principal | ICD-10-CM

## 2018-07-03 DIAGNOSIS — I428 Other cardiomyopathies: ICD-10-CM

## 2018-07-03 DIAGNOSIS — E669 Obesity, unspecified: ICD-10-CM

## 2018-07-03 DIAGNOSIS — I10 Essential (primary) hypertension: ICD-10-CM

## 2018-07-03 MED ORDER — LISINOPRIL 2.5 MG PO TAB
2.5 mg | ORAL_TABLET | Freq: Every day | ORAL | 3 refills | Status: SS
Start: 2018-07-03 — End: 2019-05-05

## 2018-07-03 MED ORDER — PERFLUTREN LIPID MICROSPHERES 1.1 MG/ML IV SUSP
1-20 mL | Freq: Once | INTRAVENOUS | 0 refills | Status: DC | PRN
Start: 2018-07-03 — End: 2018-07-08

## 2018-07-03 MED ADMIN — PERFLUTREN LIPID MICROSPHERES 1.1 MG/ML IV SUSP [79178]: 2 mL | INTRAVENOUS | @ 20:00:00 | Stop: 2018-07-04 | NDC 11994001104

## 2018-07-06 ENCOUNTER — Encounter: Admit: 2018-07-06 | Discharge: 2018-07-06

## 2018-07-10 ENCOUNTER — Encounter: Admit: 2018-07-10 | Discharge: 2018-07-10

## 2018-07-12 ENCOUNTER — Encounter: Admit: 2018-07-12 | Discharge: 2018-07-12

## 2018-07-13 ENCOUNTER — Encounter: Admit: 2018-07-13 | Discharge: 2018-07-13

## 2018-07-16 ENCOUNTER — Encounter: Admit: 2018-07-16 | Discharge: 2018-07-16

## 2018-07-20 ENCOUNTER — Encounter: Admit: 2018-07-20 | Discharge: 2018-07-20

## 2018-07-27 ENCOUNTER — Encounter: Admit: 2018-07-27 | Discharge: 2018-07-27

## 2018-08-10 ENCOUNTER — Encounter: Admit: 2018-08-10 | Discharge: 2018-08-10

## 2018-08-14 ENCOUNTER — Encounter: Admit: 2018-08-14 | Discharge: 2018-08-14

## 2018-08-20 ENCOUNTER — Encounter: Admit: 2018-08-20 | Discharge: 2018-08-20

## 2018-08-20 ENCOUNTER — Ambulatory Visit: Admit: 2018-08-20 | Discharge: 2018-08-21

## 2018-08-20 DIAGNOSIS — F250 Schizoaffective disorder, bipolar type: Principal | ICD-10-CM

## 2018-08-20 MED ORDER — DIVALPROEX 500 MG PO TB24
1500 mg | ORAL_TABLET | Freq: Every evening | ORAL | 1 refills | 30.00000 days | Status: AC
Start: 2018-08-20 — End: 2019-02-01

## 2018-08-20 MED ORDER — ARIPIPRAZOLE 15 MG PO TAB
15 mg | ORAL_TABLET | Freq: Every day | ORAL | 1 refills | Status: AC
Start: 2018-08-20 — End: 2019-02-01

## 2018-08-21 DIAGNOSIS — F250 Schizoaffective disorder, bipolar type: Principal | ICD-10-CM

## 2018-08-21 DIAGNOSIS — Z79899 Other long term (current) drug therapy: Secondary | ICD-10-CM

## 2018-08-24 ENCOUNTER — Ambulatory Visit: Admit: 2018-08-24 | Discharge: 2018-08-25

## 2018-08-25 DIAGNOSIS — F250 Schizoaffective disorder, bipolar type: Principal | ICD-10-CM

## 2018-09-07 ENCOUNTER — Encounter: Admit: 2018-09-07 | Discharge: 2018-09-07

## 2018-09-18 ENCOUNTER — Ambulatory Visit: Admit: 2018-09-18 | Discharge: 2018-09-19 | Payer: MEDICARE

## 2018-09-19 DIAGNOSIS — F250 Schizoaffective disorder, bipolar type: Principal | ICD-10-CM

## 2018-10-12 ENCOUNTER — Ambulatory Visit: Admit: 2018-10-12 | Discharge: 2018-10-13 | Payer: MEDICARE

## 2018-10-13 DIAGNOSIS — F250 Schizoaffective disorder, bipolar type: Principal | ICD-10-CM

## 2018-11-13 ENCOUNTER — Encounter: Admit: 2018-11-13 | Discharge: 2018-11-13

## 2018-11-16 ENCOUNTER — Ambulatory Visit: Admit: 2018-11-16 | Discharge: 2018-11-17 | Payer: MEDICARE

## 2018-11-17 DIAGNOSIS — R441 Visual hallucinations: Secondary | ICD-10-CM

## 2018-11-17 DIAGNOSIS — F250 Schizoaffective disorder, bipolar type: Secondary | ICD-10-CM

## 2018-11-30 ENCOUNTER — Ambulatory Visit: Admit: 2018-11-30 | Discharge: 2018-12-01 | Payer: MEDICARE

## 2018-11-30 DIAGNOSIS — F250 Schizoaffective disorder, bipolar type: Secondary | ICD-10-CM

## 2018-11-30 DIAGNOSIS — R441 Visual hallucinations: Secondary | ICD-10-CM

## 2018-11-30 DIAGNOSIS — Z79899 Other long term (current) drug therapy: Secondary | ICD-10-CM

## 2018-12-14 ENCOUNTER — Ambulatory Visit: Admit: 2018-12-14 | Discharge: 2018-12-15 | Payer: MEDICARE

## 2018-12-15 DIAGNOSIS — R441 Visual hallucinations: ICD-10-CM

## 2018-12-15 DIAGNOSIS — F250 Schizoaffective disorder, bipolar type: Principal | ICD-10-CM
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
## 2018-12-28 ENCOUNTER — Ambulatory Visit: Admit: 2018-12-28 | Discharge: 2018-12-29 | Payer: MEDICARE

## 2018-12-28 DIAGNOSIS — F250 Schizoaffective disorder, bipolar type: Principal | ICD-10-CM

## 2018-12-28 DIAGNOSIS — R441 Visual hallucinations: ICD-10-CM

## 2019-01-11 ENCOUNTER — Encounter: Admit: 2019-01-11 | Discharge: 2019-01-11

## 2019-01-11 ENCOUNTER — Ambulatory Visit: Admit: 2019-01-11 | Discharge: 2019-01-12 | Payer: MEDICARE

## 2019-01-12 DIAGNOSIS — F250 Schizoaffective disorder, bipolar type: Principal | ICD-10-CM

## 2019-01-23 ENCOUNTER — Encounter: Admit: 2019-01-23 | Discharge: 2019-01-23

## 2019-01-24 ENCOUNTER — Encounter: Admit: 2019-01-24 | Discharge: 2019-01-24

## 2019-01-24 NOTE — Telephone Encounter
This therapist, Osborne Oman, MA, received verbal consent from patient's mother and legal guardian to see patient, Jamie [name], via telehealth due to concern for COVID.

## 2019-01-25 ENCOUNTER — Encounter: Admit: 2019-01-25 | Discharge: 2019-01-25

## 2019-01-26 ENCOUNTER — Ambulatory Visit: Admit: 2019-01-25 | Discharge: 2019-01-26 | Payer: MEDICARE

## 2019-01-26 DIAGNOSIS — F250 Schizoaffective disorder, bipolar type: Principal | ICD-10-CM

## 2019-01-27 NOTE — Progress Notes
voice around telehealth setup, and statement after the consent to telehealth, such as now that we've wasted half the session, Speech normal rhythm and rate, higher volume at times throughout the session than typical, speech WNL, eye contact WNL, TP grossly linear and goal-directed, TC delusions. No suicidal ideation or homicidal ideation evidenced. Actively engaged in session.     INTERVENTIONS AND RESPONSE: Therapist explored and validated Brigido's emotions around telehealth setup. Therapist used self-disclosure of own frustration and confusion to validate such emotions, which appeared to decrease Jaun's anger. Therapist also explored Nichael's feelings around the overreaction and difficulty of not seeing his parents, which he stated is alright with him. Therapist also highlighted and encouraged Hayward's accomplishment of getting a car. Possible alternatives for the shelter-in-place order were briefly discussed, including: wanting to handle the situation better than Republicans, pressure from partner organizations, etc. Discussed that the possibility that it is purposefully harming economy would be truly evil and the importance of holding out some hope that there may be another explanation. Therapist explored with patient how Stephon had become comfortable enough to share new information last session. Emidio discussed feeling that they have built strong relationship with meeting every two weeks for full hour and that this may be the appropriate place for discussing certain feelings, which he felt that he had to tell someone. Therapist encouraged this conceptualization of therapeutic relationship as safe place. He also feels therapist is nice and non-threatening, laughs at his jokes, and when provides directive it is a: take it or leave it approach and does not force him to talk about things that he does not want to talk about. When asked if

## 2019-01-28 ENCOUNTER — Encounter: Admit: 2019-01-28 | Discharge: 2019-01-28

## 2019-01-29 ENCOUNTER — Encounter: Admit: 2019-01-29 | Discharge: 2019-01-29

## 2019-01-30 ENCOUNTER — Encounter: Admit: 2019-01-30 | Discharge: 2019-01-30

## 2019-02-01 ENCOUNTER — Encounter: Admit: 2019-02-01 | Discharge: 2019-02-01

## 2019-02-01 ENCOUNTER — Ambulatory Visit: Admit: 2019-02-01 | Discharge: 2019-02-02 | Payer: MEDICARE

## 2019-02-01 MED ORDER — ARIPIPRAZOLE 15 MG PO TAB
15 mg | ORAL_TABLET | Freq: Every day | ORAL | 1 refills | Status: DC
Start: 2019-02-01 — End: 2019-05-02

## 2019-02-01 MED ORDER — DIVALPROEX 500 MG PO TB24
1500 mg | ORAL_TABLET | Freq: Every evening | ORAL | 1 refills | 30.00000 days | Status: DC
Start: 2019-02-01 — End: 2019-05-02

## 2019-02-02 DIAGNOSIS — Z79899 Other long term (current) drug therapy: ICD-10-CM

## 2019-02-02 DIAGNOSIS — F250 Schizoaffective disorder, bipolar type: Principal | ICD-10-CM

## 2019-02-04 ENCOUNTER — Encounter: Admit: 2019-02-04 | Discharge: 2019-02-04

## 2019-02-04 DIAGNOSIS — F250 Schizoaffective disorder, bipolar type: Principal | ICD-10-CM

## 2019-02-05 ENCOUNTER — Encounter: Admit: 2019-02-05 | Discharge: 2019-02-05

## 2019-02-05 MED ORDER — ABILIFY MAINTENA 400 MG IM SERS
400 mg | INTRAMUSCULAR | 11 refills | 30.00000 days | Status: DC
Start: 2019-02-05 — End: 2019-05-23

## 2019-02-05 NOTE — Telephone Encounter
Gena from the Patient Assistance Program contacted clinic and stated that they received the patient's application, but stated that it was not complete. Stated that patient did not provide his proof of income. Also stated that patient marked that there was 2 people in the household and are needing a proof of income from the second person. Stated that they are open Monday-Friday 8a-8p.     Callback number: [phone].  Fax number: [phone].

## 2019-02-05 NOTE — Telephone Encounter
Patient would like to pursue patient assistance for Abilify Maintenna.  Completed application on [place] patient assistance foundation website.  Spoke with patient to obtain information and complete patient consent.  Application submitted.  Prescription sent to [place] pharmacy where med will be shipped to clinic if approved.  Expect approval within 5 days.      Robin Searing, PharmD  Mental Health Clinical Pharmacist - Ambulatory

## 2019-02-06 ENCOUNTER — Encounter: Admit: 2019-02-06 | Discharge: 2019-02-06

## 2019-02-08 ENCOUNTER — Ambulatory Visit: Admit: 2019-02-08 | Discharge: 2019-02-09 | Payer: MEDICARE

## 2019-02-08 ENCOUNTER — Encounter: Admit: 2019-02-08 | Discharge: 2019-02-08

## 2019-02-08 DIAGNOSIS — F250 Schizoaffective disorder, bipolar type: Principal | ICD-10-CM
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
## 2019-02-08 NOTE — Progress Notes
No Vitals per pt. Pt consented to video visit. Pt verified name and DOB. Pt stated he understands all the documents that were sent to him and has no questions at this time.

## 2019-02-11 ENCOUNTER — Encounter: Admit: 2019-02-11 | Discharge: 2019-02-11

## 2019-02-26 ENCOUNTER — Encounter: Admit: 2019-02-26 | Discharge: 2019-02-26

## 2019-02-26 ENCOUNTER — Ambulatory Visit: Admit: 2019-02-26 | Discharge: 2019-02-27 | Payer: MEDICARE

## 2019-02-26 DIAGNOSIS — F250 Schizoaffective disorder, bipolar type: Principal | ICD-10-CM

## 2019-02-26 DIAGNOSIS — R441 Visual hallucinations: ICD-10-CM

## 2019-02-28 NOTE — Progress Notes
conduct recovery-oriented care and help patient take action toward valued goals, including increasing relationships and social connections   - Therapist may conduct family meeting in the future to discuss short and long-term goals   - For homework, Wilma will write a 1-10 scale detailing different levels of symptoms.   - Next session may focus on: coping skills that can be used at each level of the 1-10 scale, evaluate healthy eating/exercise routine. May also explore Calloway's understanding of himself as not human and how this might influence eating and other behaviors.     [  x   ]  The proposed treatment plan was discussed with the patient/guardian who was provided the opportunity to ask questions and make suggestions regarding alternative treatment.  If not, why not  Risks/Benefits of Treatment Options Reviewed:  Yes    Osborne Oman, MA   Psychology Intern     Supervisor: Shane Crutch, PhD

## 2019-03-14 ENCOUNTER — Encounter: Admit: 2019-03-14 | Discharge: 2019-03-14

## 2019-03-19 ENCOUNTER — Encounter: Admit: 2019-03-19 | Discharge: 2019-03-19

## 2019-03-26 ENCOUNTER — Encounter: Admit: 2019-03-26 | Discharge: 2019-03-26

## 2019-03-26 ENCOUNTER — Ambulatory Visit: Admit: 2019-03-26 | Discharge: 2019-03-27 | Payer: MEDICARE

## 2019-03-26 DIAGNOSIS — F250 Schizoaffective disorder, bipolar type: Principal | ICD-10-CM

## 2019-03-26 DIAGNOSIS — R441 Visual hallucinations: ICD-10-CM

## 2019-03-26 NOTE — Progress Notes
Two patient-identifiers verified for HIPPA  Compliance.  Patient verbally consented to telerhealth visit. Vitals provided by patient

## 2019-03-28 IMAGING — CR CHEST
2 series · 2 of 2 positions shown · non-contrast
Comparison: none

[chest lat (1 of 2)]
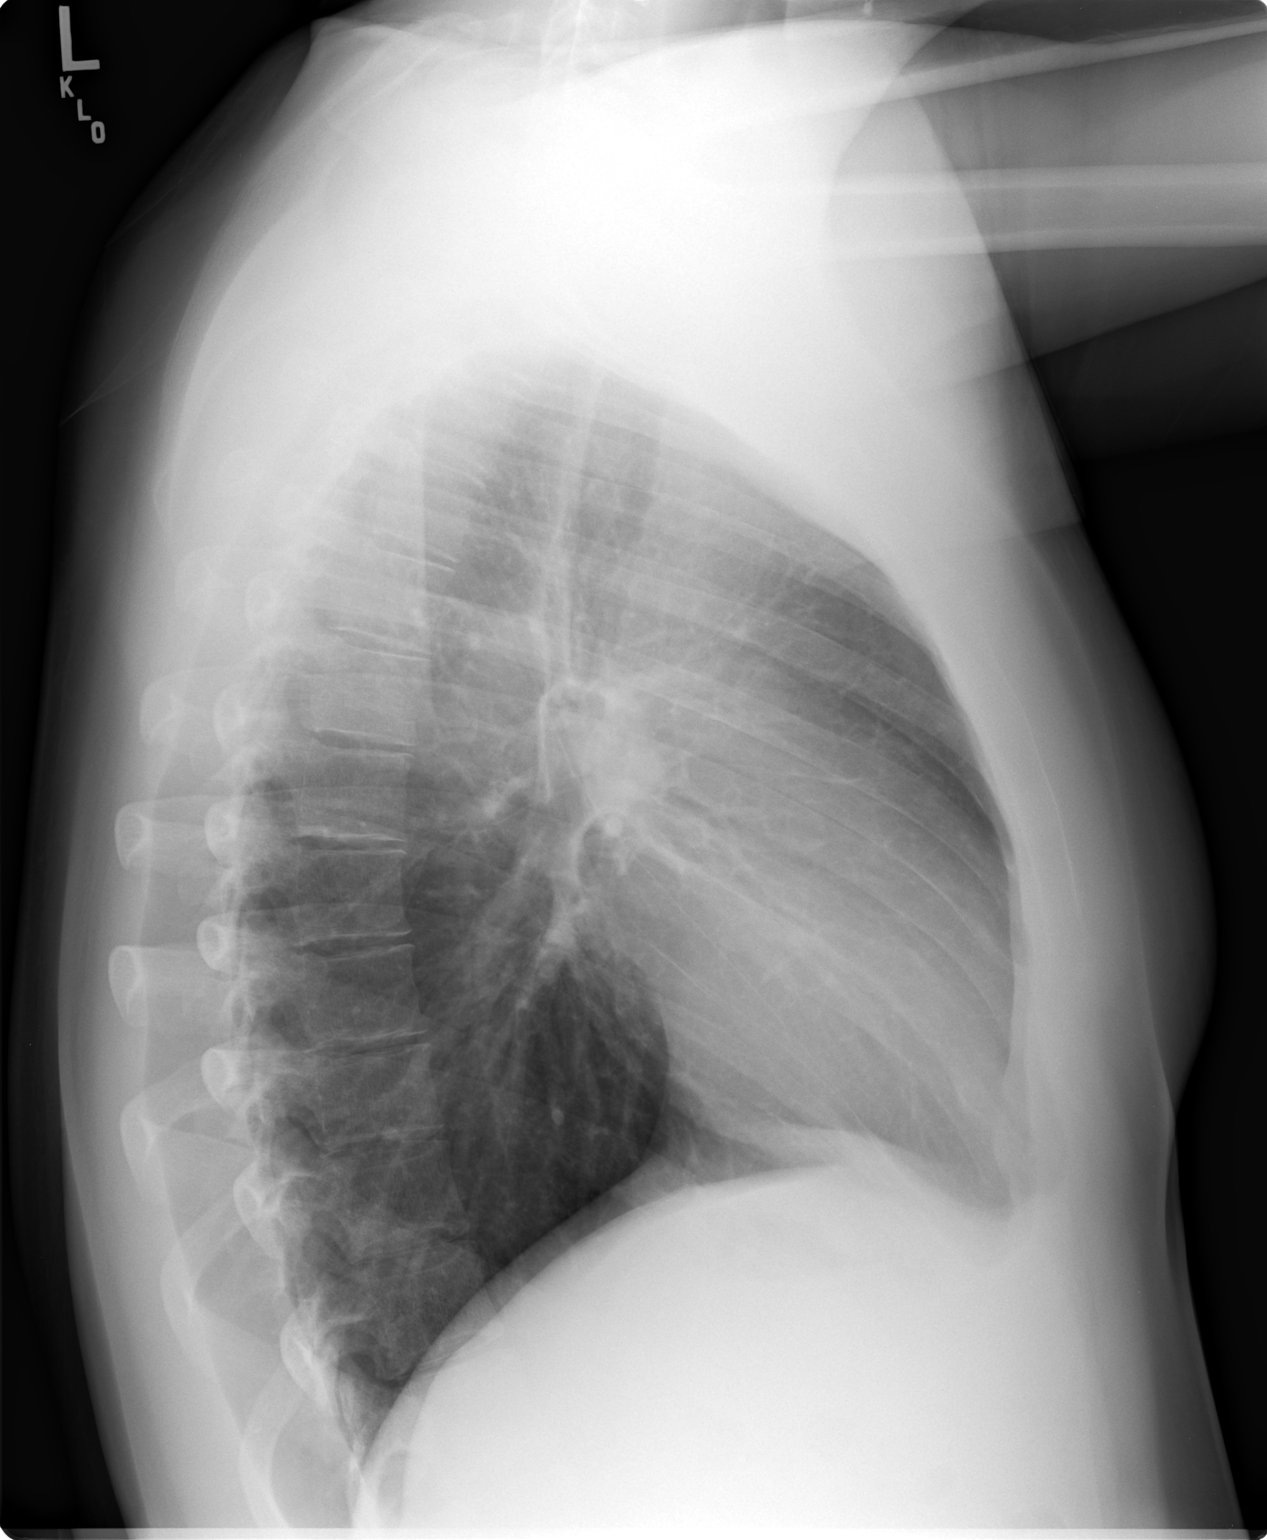

[chest lat (2 of 2)]
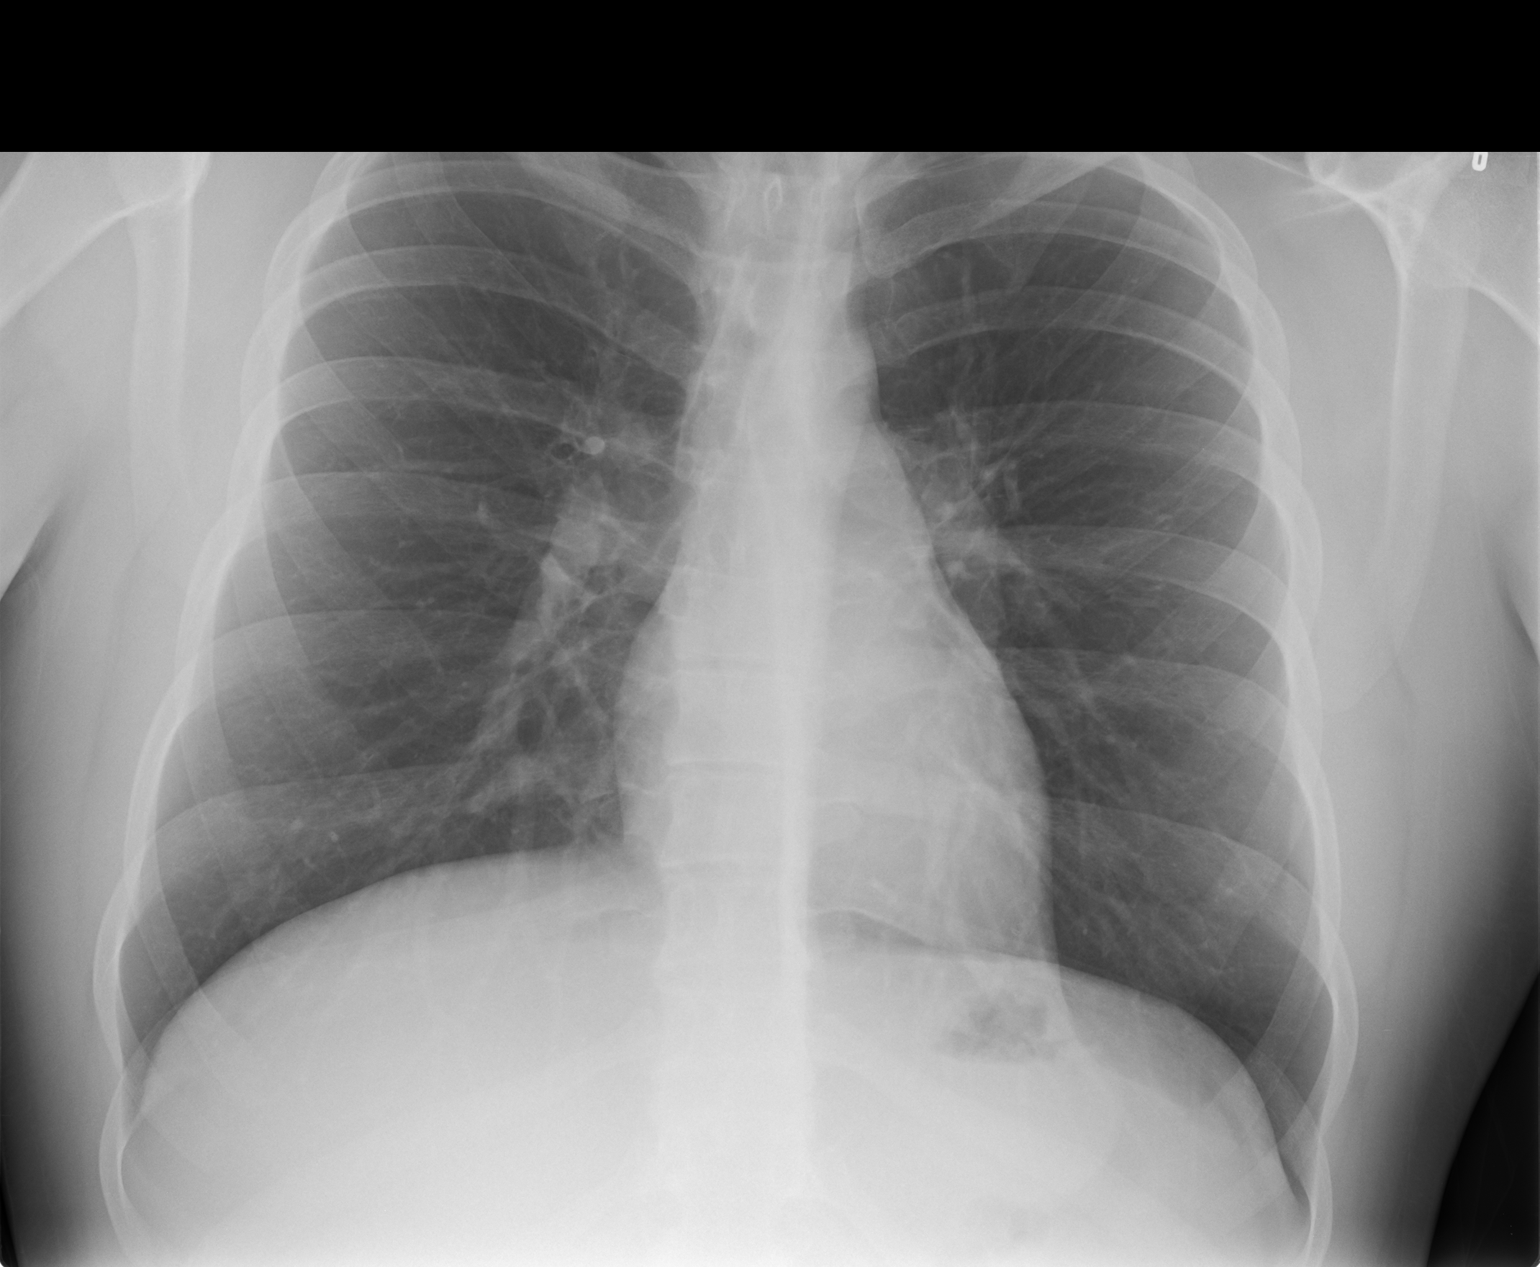

[2 of 2 positions shown; findings below may reference images not displayed]

05/08/18

EXAM
Two-view chest

INDICATION
Employment physical

TECHNIQUE
None available

COMPARISONS
None available

FINDINGS
Heart normal. Lungs clear. No focal lung consolidation.

IMPRESSION
Lungs clear

____________________
[HOSPITAL], PC

Tech Notes:

SILICA SCREENING. DREEZY/MISAEL

## 2019-04-09 ENCOUNTER — Ambulatory Visit: Admit: 2019-04-09 | Discharge: 2019-04-10

## 2019-04-09 ENCOUNTER — Encounter: Admit: 2019-04-09 | Discharge: 2019-04-09

## 2019-04-09 DIAGNOSIS — F250 Schizoaffective disorder, bipolar type: Secondary | ICD-10-CM

## 2019-04-09 NOTE — Progress Notes
No Vitals per pt  Pt consented to video visit. Pt verified name and DOB. Pt stated he understands all the documents that were sent to him and has no questions at this time.

## 2019-04-10 DIAGNOSIS — F250 Schizoaffective disorder, bipolar type: Principal | ICD-10-CM
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
## 2019-04-17 NOTE — Progress Notes
and therapist worked together to begin to create a 1-10 scale that patient can use to identify his own state as well as communicating to others how he is feeling. Discussed prior examples of disconnection with reality and when to seek hospitalization; patient appears to have strong understanding of possible harm to self/other necessitating hospitalization at this point. This scale is started below:     1 - sense of freedom, satisfaction, happiness  2-7 need to be filled in   8 - confusion of what is real or not, can think of other things   9 - doubt, uneasiness, paranoia   10 - complete shift of reality, palpable force (example from prior hospitalizations)     IMPRESSIONS/DIAGNOSIS (ICD 10):  Schizoaffective Disorder, Bipolar Type, Continuous, Moderate (F25.0)    TREATMENT PLAN:    - Therapist will meet with patient approximately every two weeks-one month for individual psychotherapy for psychosis   - Therapist will use evidence-based cognitive behavioral techniques to address delusional beliefs and hallucinations, such as thought evaluation, emotion regulation skills, and problem-solving  - Therapist will also focus on clients strengths, values, and goals to conduct recovery-oriented care and help patient take action toward valued goals, including increasing relationships and social connections   - Therapist may conduct family meeting in the future to discuss short and long-term goals   - Next (second to last session) will focus on filling in the rest of the 1-10 scale, which may aid patient's transition to new therapist in sharing with Dr. Lowry Bowl     [  x   ]  The proposed treatment plan was discussed with the patient/guardian who was provided the opportunity to ask questions and make suggestions regarding alternative treatment.  If not, why not  Risks/Benefits of Treatment Options Reviewed:  Yes    Osborne Oman, MA   Psychology Intern     Supervisor: Shane Crutch, PhD

## 2019-04-23 ENCOUNTER — Encounter: Admit: 2019-04-23 | Discharge: 2019-04-23

## 2019-04-23 ENCOUNTER — Ambulatory Visit: Admit: 2019-04-23 | Discharge: 2019-04-23

## 2019-04-23 DIAGNOSIS — R441 Visual hallucinations: Secondary | ICD-10-CM

## 2019-04-23 DIAGNOSIS — F250 Schizoaffective disorder, bipolar type: Secondary | ICD-10-CM

## 2019-04-23 NOTE — Progress Notes
No Vitals per pt  Pt consented to video visit. Pt verified name and DOB. Pt stated he understands all the documents that were sent to him and has no questions at this time.

## 2019-04-28 NOTE — Progress Notes
DATE: 04/23/2019    START TIME: 10:02 AM   STOP TIME: 11:09 AM     Obtained patient's verbal consent to work with them via telehealth during the [employer] Emergency. Verbal consent was also obtained from patient's guardians prior to session. Per prior sessions, patient stated that both his parents can be contacted in the case of medical or psychiatric emergency.  Psychology intern Jamie Oman, MA conducted this session from the [place] [facility] via Zoom with patient in his home. No technical difficulties noted.      BEHAVIORS/SYMPTOM/DATA/PATIENT COMPLAINT: Jamie [name] expressed some sadness that therapist was leaving. It was noted that he began session by noting that therapist's hair did not look good. When re-directed, he noted that he had found this relationship with therapist very positive, as she was open/non-threatening. He discussed that he has told this therapist things that even his closest others do not know and he expressed sadness that others in his life do not know the reality of how these experiences (e.g. seeing things at night) impact him. He also discussed a prior experience with a psychiatrist who he felt had pressured him into taking a particular medication, which had led to hospitalization as a contrast to current therapeutic experience. 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 He discussed that he has done case study on himself by taking Niactic Acid daily a couple of years ago and currently Nicotine daily, noting the symptoms are the same.     MENTAL STATUS EXAMINATION: 23 year old male, casually dressed and groomed, alert and oriented, mood appeared euthymic and frustrated. Speech normal rhythm and rate, speech WNL, eye contact WNL, TP grossly linear and goal-directed, TC delusions. No suicidal ideation or homicidal ideation evidenced. No health behaviors necessitating immediate intervention at this session, although patient identified using chewing tobacco. Actively engaged in session.     INTERVENTIONS AND RESPONSE: Therapist encouraged patient's engagement in this therapeutic relationship and his ability to continue this relationship/work with Dr. Lowry [name]. Therapist also asked if patient would have interest in writing/speaking about his experiences to share them/be a role model for others. 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 Therapist encouraged patient to discuss Niacitic Acid more with Dr. Lowry [name]. Therapist solicited feedback but patient denied any negative feedback. It was noted by this therapist that patient's comment about her hair may indicate some anger/frustration with this therapist for leaving or other behavior that is unknown. Therapist expressed appreciation for Jamie [name] and the work that they did together. It was noted that when therapist shared she will be going to [employer] fellowship, patient stated: you know not everyone with this is as good as me.    IMPRESSIONS/DIAGNOSIS (ICD 10):  Schizoaffective Disorder, Bipolar Type, Continuous, Moderate (F25.0)    TREATMENT PLAN: This was patient's last session with this therapist, Jamie Oman, MA and there will be no further follow-up from this therapist (aside from card mailed to his home). Patient will continue psychotherapy in this clinic with Dr. Shane Crutch, PhD. Next appointment scheduled 05/13/2019.     [  x   ]  The proposed treatment plan was discussed with the patient/guardian who was provided the opportunity to ask questions and make suggestions regarding alternative treatment.  If not,  why not  Risks/Benefits of Treatment Options Reviewed:  Yes    Jamie Oman, MA   Psychology Intern Supervisor: Jamie Crutch, PhD

## 2019-05-02 ENCOUNTER — Encounter: Admit: 2019-05-02 | Discharge: 2019-05-02

## 2019-05-02 DIAGNOSIS — Z79899 Other long term (current) drug therapy: Secondary | ICD-10-CM

## 2019-05-02 DIAGNOSIS — F250 Schizoaffective disorder, bipolar type: Secondary | ICD-10-CM

## 2019-05-02 DIAGNOSIS — H43393 Other vitreous opacities, bilateral: Secondary | ICD-10-CM

## 2019-05-02 MED ORDER — ARIPIPRAZOLE 20 MG PO TAB
20 mg | ORAL_TABLET | Freq: Every day | ORAL | 1 refills | 30.00000 days | Status: DC
Start: 2019-05-02 — End: 2019-05-23

## 2019-05-02 MED ORDER — DIVALPROEX 500 MG PO TB24
1500 mg | ORAL_TABLET | Freq: Every evening | ORAL | 1 refills | 30.00000 days | Status: DC
Start: 2019-05-02 — End: 2019-05-23

## 2019-05-02 NOTE — Progress Notes
Obtained patient's verbal consent to treat them and their agreement to [facility] financial policy and NPP via this telehealth visit during the [facility] Emergency.    Start time: 2:30 pm  End time: 3:23 pm       Subjective:    History of Present Illness     23 y.o. M with hx of Schizoaffective Bipolar Type and hx of clozapine induced myocarditis last seen on 02/01/19. No changes were made to Abilify 15mg  daily and depakote ER 1500 mg at bedtime. Patient is unaccompanied today via Zoom video follow up. His father Jamie [name] and wife, Jamie [name], are his legal guardians.    Psychiatric: Patient is trying to find the positive perspective in the current social climate.  No depression, mania, or psychosis per patient. He was pretty forthcoming with this information and reflected sometimes patients want to hide this from the people they should tell. Patient continues to believe he is doing the best he has done in a long time. Rarely misses a dose of his medications which he attributed to a good routine. In particular, regular sleep is key and generally fine.     Suffers from a lot of anxiety. It is stressful for him to sometimes see a dual world. Driving helps because bugs scatter the floaters. It is now constant and a bit more severe. His eyes are always darting, so makes it difficult to concentrate and keep a train of thought. Describes it as being similar to having ADHD. He is not sure his visual condition is part of his mental illness or something separate. Sometimes hears noises that are not particularly from one spot, no voices talking. Endorses passive SI when he reflects on where he is in life vs where he could have been and contemplates what it would like to not be here. No active SI with plan or intent. Denies HI. Plans to continue therapy.      Medical:  Not evaluated by ophthalmology for possible vision issues in the past. Plays pickle ball for exercise. Social: Living in [place] in apartment. Alcohol - tries to avoid alcohol, two nights of heavy drinking after telling parents he went to a different church. CBD oil - tried for pain. Caffeine - none to a lot depending on the day (coffee and energy drinks). Patient purchased a car and is driving again. 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 In a relationship with girlfriend x 1 week.       Review of Systems   Constitutional: Negative for activity change and unexpected weight change.   Eyes: Positive for visual disturbance.   Gastrointestinal: Negative for nausea.   Neurological: Negative for tremors.         Objective:         ??? ARIPiprazole (ABILIFY) 15 mg tablet Take one tablet by mouth daily.   ??? ARIPiprazole(+) (ABILIFY MAINTENA) 400 mg injection SYRINGE Inject 400 mg into the muscle every 28 days.   ??? carvedilol (COREG) 3.125 mg tablet Take two tablets by mouth twice daily with meals.   ??? cholecalciferol (VITAMIN D-3) 1,000 units tablet Take one tablet by mouth daily.   ??? divalproex (DEPAKOTE ER) 500 mg ER tablet Take three tablets by mouth at bedtime daily. Take with food.   ??? lisinopril (PRINIVIL, ZESTRIL) 2.5 mg tablet Take one tablet by mouth daily. Indications: chronic heart failure   ??? melatonin 3 mg tab Take one tablet by mouth at bedtime  daily.   ??? spironolactone (ALDACTONE) 25 mg tablet Take 1 tablet by mouth daily with food.     Vitals:    05/02/19 1356   Weight: 95.3 kg (210 lb)   Height: 175.3 cm (69)   PainSc: Zero     Body mass index is 31.01 kg/m???.       Physical Exam   General/Constitutional: 23 y.o. white male, dressed in casual attire, good grooming, clean shaven  Eye Contact: good, appropriate   Behavior: cooperative, calm  Speech: normal rate, tone, and volume, effort at articulation at times  Mood: pretty good  Affect: euthymic, bright, mood congruent  Thought Process: mostly linear Thought Content: Denies SI, HI, no evidence of delusions  Perception: Endorses VH of colored lines and dots, vague AH which could be illusion   Associations: intact  Insight: partial   Judgement: fair to good    Orientation: intact  Recent and remote memory: intact  Attention span and concentration: intact to conversation  Cognition: intact  Language: English, fluent  Fund of knowledge/vocabulary: average    Neuro: no appreciable tremor or abnormal perioral movements      Metabolic monitoring:     There is no height or weight on file to calculate BMI.  Wt Readings from Last 3 Encounters:   04/23/19 95.3 kg (210 lb)   04/09/19 95.3 kg (210 lb)   03/26/19 95.3 kg (210 lb)     BP Readings from Last 3 Encounters:   08/20/18 120/76   07/03/18 132/66   07/03/18 132/66     AIMS Score (Calculated): 0 (06/18/2018  4:00 PM)    Lab Results   Component Value Date    CHOL 189 05/13/2018    TRIG 74 05/13/2018    HDL 53 05/13/2018    LDL 117 (H) 05/13/2018    VLDL 15 05/13/2018    NONHDLCHOL 136 05/13/2018    CHOLHDLC 3.5 10/08/2015     Hemoglobin A1C   Date Value Ref Range Status   05/13/2018 5.2 4.0 - 6.0 % Final     Comment:     The ADA recommends that most patients with type 1 and type 2 diabetes maintain   an A1c level <7%.         AIMS Score (Calculated): 0 (06/18/2018  4:00 PM)    Facial and Oral Movements  Muscles of Facial Expression: 0 (06/18/2018  4:00 PM)    Lips and Perioral Area: 0 (06/18/2018  4:00 PM)    Jaw: 0 (06/18/2018  4:00 PM)    Tongue: 0 (06/18/2018  4:00 PM)    Extremity Movements  Upper (arms, wrists, hands, fingers): 0 (06/18/2018  4:00 PM)    Lower (legs, knees, ankles, toes): 0 (06/18/2018  4:00 PM)    Trunk Movements  Neck, shoulders, hips: 0 (06/18/2018  4:00 PM)    Overall Severity  Severity of abnormal movement (highest score from 1-7): 0 (06/18/2018  4:00 PM)    Incapacitation due to abnormal movements: 0 (06/18/2018  4:00 PM)    Patient's awareness of abnormal movements (rate only patient's report): 0 (06/18/2018  4:00 PM)    Dental Status  Current problems with teeth and/or dentures : No (06/18/2018  4:00 PM)    Does patient usually wear dentures?: No (06/18/2018  4:00 PM)        Assessment and Plan:  Schizoaffective Disorder, Bipolar Type  Alcohol misuse    Obesity  HTN  Vitamin D deficiency   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)    Plan:   - Appreciate pharmacy assistance to explore Abilify Maintena as an option    - Patient working with his father on obtaining necessary documentation   - Increase Abilify 15 to 20 mg daily pending above   - AIMS 0 on 06/18/18, repeat AIMS (in-person visit requested)   - Obtain updated metabolic labs (ordered)   - Attempted to contact guardian to discuss [phone]), no answer, left message   - Continue Depakote ER 1500mg  qhs   - VA level 93.5 on 05/15/18, obtain updated level (ordered)   - Check CMP (ordered)  - Continue Vitamin D supplementation   - Continue exercise and lifestyle changes including alcohol reduction, praised efforts  - Continue psychotherapy  - Referral to ophthalmology to rule out medical etiologies of visual floaters  - Safety planning reviewed and provided in AVS.    Return to clinic in 2-3 months.    Patient seen and discussed with Dr. Epimenio Sarin.     Zollie Beckers, DO  PGY-4, Internal Medicine/Psychiatry  Pager: [phone]

## 2019-05-02 NOTE — Progress Notes
Two patient-identifiers verified for HIPPA  Compliance.  Patient verbally consented to telerhealth visit. Vitals provided by patient

## 2019-05-02 NOTE — Progress Notes
ATTESTATION    I personally performed the key portions of the E/M visit, discussed the case with the resident and concur with resident documentation of history, physical exam, assessment, and treatment plan unless otherwise noted.  I personally participated in development of the plan of care.    Pt seen via tele health. Pt doing fairly well on current dosing of Abilify and Depakote. Did discuss that he sometimes sees shapes and stars, discussed how the stars at 10 to the 11th power and that he struggles with dealing with this alternative universe that he sees. Is open to increasing his Abilify from 15 --> 20. Still has not been able to get on the maintenna injection. Working with his father on this regarding the paperwork to be approved. Continue current dosing of Depakote. Resident will be ordering lipids and A1c as last labwork obtained last July. RTC in apprx 2 months.      Staff name:  Zonia Kief, MD Date:  05/02/2019

## 2019-05-05 ENCOUNTER — Encounter: Admit: 2019-05-05 | Discharge: 2019-05-05

## 2019-05-05 DIAGNOSIS — F3010 Manic episode without psychotic symptoms, unspecified: Secondary | ICD-10-CM

## 2019-05-05 DIAGNOSIS — I10 Essential (primary) hypertension: Secondary | ICD-10-CM

## 2019-05-05 DIAGNOSIS — I514 Myocarditis, unspecified: Secondary | ICD-10-CM

## 2019-05-05 DIAGNOSIS — F1010 Alcohol abuse, uncomplicated: Secondary | ICD-10-CM

## 2019-05-05 DIAGNOSIS — F1910 Other psychoactive substance abuse, uncomplicated: Secondary | ICD-10-CM

## 2019-05-05 DIAGNOSIS — E669 Obesity, unspecified: Secondary | ICD-10-CM

## 2019-05-05 DIAGNOSIS — F250 Schizoaffective disorder, bipolar type: Secondary | ICD-10-CM

## 2019-05-05 LAB — URINALYSIS DIPSTICK
Lab: NEGATIVE MMOL/L (ref 21–30)
Lab: NEGATIVE g/dL (ref 6.0–8.0)
Lab: NEGATIVE mg/dL (ref 0.3–1.2)

## 2019-05-05 LAB — CBC AND DIFF
Lab: 0 10*3/uL (ref 0–0.20)
Lab: 0.1 10*3/uL (ref 0–0.45)
Lab: 0.8 K/UL — ABNORMAL HIGH (ref >60)
Lab: 1 % (ref 0–2)
Lab: 10 10*3/uL (ref 4.5–11.0)
Lab: 12 % (ref 11–15)
Lab: 6.6 K/UL (ref 1.8–7.0)

## 2019-05-05 LAB — COMPREHENSIVE METABOLIC PANEL
Lab: 140 MMOL/L (ref 137–147)
Lab: 18 mg/dL (ref 7–25)
Lab: 20 U/L (ref 7–40)
Lab: 4.5 g/dL (ref 3.5–5.0)
Lab: 90 U/L (ref 25–110)
Lab: >60 mL/min (ref >60)

## 2019-05-05 LAB — URINALYSIS, MICROSCOPIC

## 2019-05-05 LAB — ALCOHOL LEVEL: Lab: <10 mg/dL — ABNORMAL LOW (ref 21–28)

## 2019-05-05 LAB — COVID-19 (SARS-COV-2) PCR

## 2019-05-05 MED ORDER — DIVALPROEX 500 MG PO TB24
1500 mg | Freq: Every evening | ORAL | 0 refills | Status: DC
Start: 2019-05-05 — End: 2019-05-13
  Administered 2019-05-06 – 2019-05-13 (×8): 1500 mg via ORAL

## 2019-05-05 MED ORDER — CHLORDIAZEPOXIDE HCL 25 MG PO CAP
50 mg | ORAL | 0 refills | Status: CN
Start: 2019-05-05 — End: ?

## 2019-05-05 MED ORDER — HYDROXYZINE HCL 25 MG PO TAB
25 mg | Freq: Three times a day (TID) | ORAL | 0 refills | Status: DC | PRN
Start: 2019-05-05 — End: 2019-05-24
  Administered 2019-05-07: 02:00:00 25 mg via ORAL

## 2019-05-05 MED ORDER — NICOTINE (POLACRILEX) 2 MG BU GUM
2 mg | BUCCAL | 0 refills | Status: DC | PRN
Start: 2019-05-05 — End: 2019-05-24
  Administered 2019-05-07 – 2019-05-23 (×44): 2 mg via BUCCAL

## 2019-05-05 MED ORDER — FOLIC ACID 1 MG PO TAB
1 mg | Freq: Every day | ORAL | 0 refills | Status: DC
Start: 2019-05-05 — End: 2019-05-24
  Administered 2019-05-05 – 2019-05-23 (×19): 1 mg via ORAL

## 2019-05-05 MED ORDER — DOCUSATE SODIUM 100 MG PO CAP
100 mg | Freq: Two times a day (BID) | ORAL | 0 refills | Status: DC | PRN
Start: 2019-05-05 — End: 2019-05-24

## 2019-05-05 MED ORDER — CHLORDIAZEPOXIDE HCL 25 MG PO CAP
25 mg | ORAL | 0 refills | Status: CN
Start: 2019-05-05 — End: ?

## 2019-05-05 MED ORDER — THIAMINE MONONITRATE (VIT B1) 100 MG PO TAB
100 mg | Freq: Every day | ORAL | 0 refills | Status: DC
Start: 2019-05-05 — End: 2019-05-24
  Administered 2019-05-05 – 2019-05-23 (×19): 100 mg via ORAL

## 2019-05-05 MED ORDER — OLANZAPINE 5 MG PO TBDI
5 mg | ORAL | 0 refills | Status: DC | PRN
Start: 2019-05-05 — End: 2019-05-24

## 2019-05-05 MED ORDER — ARIPIPRAZOLE 10 MG PO TAB
10 mg | Freq: Every day | ORAL | 0 refills | Status: DC
Start: 2019-05-05 — End: 2019-05-06
  Administered 2019-05-05 – 2019-05-06 (×2): 10 mg via ORAL

## 2019-05-05 MED ORDER — NICOTINE 7 MG/24 HR TD PT24
1 | Freq: Every day | TRANSDERMAL | 0 refills | Status: DC
Start: 2019-05-05 — End: 2019-05-24
  Administered 2019-05-05 – 2019-05-23 (×14): 1 via TRANSDERMAL

## 2019-05-05 MED ORDER — CHLORDIAZEPOXIDE HCL 25 MG PO CAP
25-50 mg | ORAL | 0 refills | Status: CN | PRN
Start: 2019-05-05 — End: ?

## 2019-05-05 MED ORDER — MELATONIN 3 MG PO TAB
3 mg | Freq: Every evening | ORAL | 0 refills | Status: DC
Start: 2019-05-05 — End: 2019-05-15
  Administered 2019-05-06 – 2019-05-15 (×10): 3 mg via ORAL

## 2019-05-05 MED ORDER — FAMOTIDINE 20 MG PO TAB
20 mg | Freq: Two times a day (BID) | ORAL | 0 refills | Status: DC | PRN
Start: 2019-05-05 — End: 2019-05-16

## 2019-05-05 MED ORDER — PATCH DOCUMENTATION - NICOTINE 7 MG/24HR
Freq: Two times a day (BID) | TRANSDERMAL | 0 refills | Status: DC
Start: 2019-05-05 — End: 2019-05-24

## 2019-05-05 MED ORDER — TRAZODONE 50 MG PO TAB
50 mg | Freq: Every evening | ORAL | 0 refills | Status: DC | PRN
Start: 2019-05-05 — End: 2019-05-24
  Administered 2019-05-06 – 2019-05-23 (×14): 50 mg via ORAL

## 2019-05-05 MED ORDER — ACETAMINOPHEN 325 MG PO TAB
650 mg | ORAL | 0 refills | Status: DC | PRN
Start: 2019-05-05 — End: 2019-05-24
  Administered 2019-05-05 – 2019-05-18 (×2): 650 mg via ORAL

## 2019-05-05 MED ORDER — OLANZAPINE 10 MG IM SOLR
5 mg | INTRAMUSCULAR | 0 refills | Status: DC | PRN
Start: 2019-05-05 — End: 2019-05-24
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
## 2019-05-05 NOTE — Progress Notes
PSYCHIATRIC NURSING ADMISSION ASSESSMENT     NAME:Jamie [name]             MRN: [id number]             DOB:Apr 22, 1996          AGE: 23 y.o.  ADMISSION DATE: 05/05/2019             DAYS ADMITTED: LOS: 0 days    Patient presents to the unit from: [facility] (comment which unit) [place] - 11    Patient states he was brought into ED after getting red Pt states the color red makes him enraged and he was not drinking or eating and laying in the sun. Pt reports events are blurry after pt remembers getting naked. Pt reports he got naked for all the heroes of [place], especially the solider's. Pt reports he was hoping for a type pf rebirth on 4th of July but presented to ED on 6/30. Pt reports prior anger outbursts but never directed towards anyone, usually directed towards objects at home. Pt reports feeling socially isolated and living alone. Pt reports seeing ghosts at times but denies all other visual hallucinations. Pt does however describe delusions of religious type to this RN as he describes finding his faith in March when COVID began. Pt reports also he can feel when feelings leave his body and felt this way in the ED when he was restrained. Pt at times is disorganized and tangential but appropriate with this RN. Denies Pain, SI/HI.     Pt reports stress at home, pt reports losing job in Feb and unable to find a job since. Pt reports owning his own home and family to be very supportive of him. Pt reports no previous psych or medical admissions.    Pt skin assessment negative for all findings. Pt voluntary for admission at this time.     Mental Status Exam  Legal Status: Voluntary Admission  General Appearance: Normal  Mood / Affect: Labile Mood, Elevated Mood  Speech: Excessive amount  Content Of Thought: Blames Others, Visual Hallucinations, Feels Persecuted  Motor Activity: Normal  Flow Of Thought: Tangential, Corporate treasurer, Racing Thoughts Sensorium: Normal  Insight / Judgment: Poor Judgment, Fair Insight  Behavior: Cooperative  Patient Strengths: Access to housing/residential stability, Awareness of substance use issues    CAIGE AID  Have you ever felt you ought to cut down on your drinking or drug use?: Yes  Have people annoyed you by criticizing your drinking or drug use?: Yes  Have you felt bad or guilty about your drinking or drug use?: Yes  Have you ever had a drink or used drugs first thing in the morning to steady your nerves or to get rid of a hangover (eye-opener)?: Yes  Total Score: 4    AUDIT-C  How Often Drink w/ Alcohol?: 4 or more times a week  # Drinks w/ Alcohol In Typical Day?: 10 or more  How Often 6+ Drinks Per Occasion?: Weekly  AUDIT-C Total Score: 11    Contraband/Body Checks  Patient Searched: Yes  Belongings Searched: Yes  Head Lice Check: Yes    Do you have access to firearms/weapons? No  If yes, who will secure them prior to discharge? NA    Gunnar Bulla, RN  05/05/2019

## 2019-05-05 NOTE — Progress Notes
Patient to unit per W/C.  Patient was able to ambulate to his room with out difficulty.   Patient was able to answer all questions.  Denies SI or HI at this time.  Denies hallucinations  Patient oriented to the unit and questions answered. Jamie [name]

## 2019-05-05 NOTE — Progress Notes
Pt resting in room, intermittently hyper verbal. Pt given TV to watch with several movies and pt content. Bel[place] evaluated in intake. Pt denies SI/HI and pain. Pt still reporting intermittent AH. Pt reports improved mood this AM.

## 2019-05-05 NOTE — Progress Notes
ADMISSION SAFETY PLAN    Provider assisting with plan: D Lisbet Busker RN    What TRIGGERS can I avoid that may result in thoughts to harm myself or others while in the hospital?  1.Make sure I take my medications  2. Not do drugs.   3.     What are your WARNING SIGNS of distress?   1. Taking to much  2. Hallucinations.   3.     What COPING SKILLS or DISTRACTION TECHNIQUES can you use to calm yourself down while in the hospital?    1. Take my meds.   2. Get to a quiet place to think.  3.     Who can you reach out to for help or SOCIAL SUPPORT in the event that you want to harm yourself or others while in the hospital? (Provide name and/or role)  1. Friends  2. Church  3.
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
## 2019-05-06 ENCOUNTER — Encounter: Admit: 2019-05-06 | Discharge: 2019-05-06

## 2019-05-06 LAB — LIPID PROFILE
Lab: 101 mg/dL — ABNORMAL HIGH (ref <100)
Lab: 164 mg/dL (ref <200)
Lab: 165 mg/dL — ABNORMAL HIGH (ref <150)
Lab: 33 mg/dL (ref 70–100)

## 2019-05-06 LAB — VALPROIC ACID LEVEL: Lab: 40 ug/mL — ABNORMAL LOW (ref 50.0–100.0)

## 2019-05-06 LAB — PHOSPHORUS: Lab: 3.5 mg/dL — ABNORMAL LOW (ref 2.0–4.5)

## 2019-05-06 MED ORDER — ARIPIPRAZOLE 10 MG PO TAB
20 mg | Freq: Every day | ORAL | 0 refills | Status: DC
Start: 2019-05-06 — End: 2019-05-08
  Administered 2019-05-07 – 2019-05-08 (×2): 20 mg via ORAL

## 2019-05-06 NOTE — Progress Notes
Chart check completed.

## 2019-05-06 NOTE — Med Student Progress Note
Medical Student Progress Note -  Inpatient    NAME: Jamie [name]                                     MRN: [id number]                 DOB: November 24, 1995          AGE: 23 y.o.  ADMISSION DATE: 05/05/2019             DAYS ADMITTED: LOS: 1 day        PSYCHIATRIC PROGRESS NOTE     LOS: 1 day  Voluntary/Involuntary Status: Voluntary     MEDICATIONS   Current Psychotropic Medications:   1. Aripiprazole tab 10 mg, qday  2. Divalproex tab 1500 mg oral qhs  3. Folic Acid tab 1 mg oral, qday  4. Nicotine patch 7mg /day transdermal, qday  5. Thiamine tab 100mg  oral, qday       ASSESSMENT & DIAGNOSES   Demecio Giles is a 23 y.o. Caucasian male with a history of schizoaffective disorder bipolar type who presented to [facility] yesterday with concerns of a manic episode post binge drinking. This appears to be precipitated by medication non-compliance, alcohol use and numerous illicit substances.  Factors that seem to have predisposed him to schizoaffective disorder bipolar type include medication non-compliance.  Proposed treatment has consisted of medications, individualized & group therapy.    DSM-5 DIAGNOSES:  - Schizoaffective disorder, bipolar type with manic episode   -Tobacco use disorder, mild  -Alcohol use disorder, severe  -Cannabis use disorder       PLAN     > Maintain admission for psychiatric safety, stabilization, monitoring, and treatment.  > Begin Aripiprazole 10mg  PO daily and valproic acid 1500mg  PO qHS   > PRN Olanzapine for severe agitation  > Nicotine replacement patch   > Divalproex 1500 mg  > Melatonin 3 mg  > Thiamine tab 100 mg      Metabolic labs completed on 7/6.    Disposition: Maintain admission for safety and stabilization.      ______________________________________________________________     SUBJECTIVE     Per staff, patient is without significant overnight events. Spending time in common areas, outside of room. Attending groups.          OBJECTIVE Vital Signs:  Current                Vital Signs: 24 Hour Range   BP: 159/77 (07/06 1200)  Temp: 37 ???C (98.6 ???F) (07/06 1200)  Pulse: 91 (07/06 1200)  Respirations: 20 PER MINUTE (07/06 1200)  SpO2: 99 % (07/06 1200) BP: (129-159)/(61-89)   Temp:  [36.6 ???C (97.9 ???F)-37 ???C (98.6 ???F)]   Pulse:  [65-91]   Respirations:  [16 PER MINUTE-20 PER MINUTE]   SpO2:  [98 %-100 %]    Intensity Pain Scale (Self Report): (not recorded)      Scheduled Medications:  ARIPiprazole (ABILIFY) tablet 10 mg, 10 mg, Oral, QDAY  divalproex (DEPAKOTE ER) ER tablet 1,500 mg, 1,500 mg, Oral, QHS  folic acid (FOLVITE) tablet 1 mg, 1 mg, Oral, QDAY  melatonin tablet 3 mg, 3 mg, Oral, QHS  nicotine (NICODERM CQ STEP 3) 7 mg/day patch 1 patch, 1 patch, Transdermal, QDAY    And  Verification of Patch Placement and Integrity - Nicotine 7 MG/24HR, , Transdermal, BID  thiamine (VITAMIN B-1) tablet 100  mg, 100 mg, Oral, QDAY        PRN Medications:  acetaminophen Q6H PRN 650 mg at 05/05/19 0411, docusate Q12H PRN, famotidine Q12H PRN, hydrOXYzine TID PRN, nicotine polacrilex Q1H PRN, OLANZapine Q6H PRN **OR** OLANZapine Q6H PRN, traZODone QHS PRN 50 mg at 05/05/19 2038    Didn't observe pt    ______________________________________________________________  Tonye Pearson, MS3

## 2019-05-06 NOTE — Progress Notes
Chart check completed

## 2019-05-06 NOTE — Progress Notes
TREATMENT TEAM NOTE  Name: Casper Pagliuca        MRN: [id number]          DOB: Aug 29, 1996          Age: 23 y.o.  Admission Date: 05/05/2019             LOS: 1 day        Attendees:   Psychiatry: Carmie End, DO  UR: Lina Sar, RN  Nursing:  Mancel Bale  CM: Neita Carp, LMSW  CM: Danielle Dess, LMSW  CM: Sherlyn Lick, LPC  Therapy: Wetzel Bjornstad, LMLP, LCP  Clinical Manager: Sherrilee Gilles, LSCSW  Pharmacy: Doreen Beam, PharmD    Significant Points Discussed:   Patient was in Intake for most of yesterday   Presenting complaint was manic behavior  Nursing reported calm/cooperative  Depakote level to be taken today    Treatment Plan Discussion:   Continue treatment
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
## 2019-05-06 NOTE — Progress Notes
Metabolics Readings:    Most Recent BP:    BP Readings from Last 1 Encounters:   05/06/19 (!) 152/66     Last Weight:   Vitals:    05/05/19 0110   Weight: 93 kg (205 lb)     Body mass index is 31.17 kg/m.    Lipid Panel:   LDL   Date Value Ref Range Status   05/06/2019 101 (H) <100 mg/dL Final     HDL   Date Value Ref Range Status   05/06/2019 46 >40 MG/DL Final     VLDL   Date Value Ref Range Status   05/06/2019 33 MG/DL Final     Cholesterol   Date Value Ref Range Status   05/06/2019 164 <200 MG/DL Final     Triglycerides   Date Value Ref Range Status   05/06/2019 165 (H) <150 MG/DL Final       Blood Glucose:  Hemoglobin A1C   Date Value Ref Range Status   05/06/2019 5.1 4.0 - 6.0 % Final     Comment:     The ADA recommends that most patients with type 1 and type 2 diabetes maintain   an A1c level <7%.         Second-generation antipsychotics (SGAs) are associated with metabolic dysregulation, including obesity, diabetes, dyslipidemia, and hypertension. The dramatic increase in diabetes, diabetic ketoacidosis, and death in the late 1990s and early 2000s prompted the FDA in August 2003 to apply a class warning to the labels of all SGAs and require that practitioners monitor metabolic parameters in patients receiving SGAs, before and after initiating therapy.    Doreen Beam, PHARMD
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
## 2019-05-06 NOTE — Care Plan
[facility] CONSULTATION    ASSESSMENT/RECOMMENDATIONS  Patient was referred for [facility] consultation  Tobacco Use Treatment Practical Counseling was provided in hospital (including recognizing danger situations, developing coping skills and providing basic information about quitting).         This tobacco treatment counseling and education consult was completed by phone due to [facility] social isolation protocols.     MEDICATION RECOMMENDATIONS TO QUIT TOBACCO:  In-patient quit-tobacco medication: Patient already has appropriate medication dosage   Discharge medication options: Patient declined using smoking cessation medication at this time     Post discharge support referral: Declined    [facility] Educational Material: Declined        History of Present Illness   Reports using 0 cigarettes per day.  Reports using tobacco after 60 minutes minutes of waking.  E-Cigarette or vape use: Never  Other tobacco use: about 1 to 1.5 cans Smokeless tobacco per week  Years used: 7  Withdrawal: No nicotine withdrawal based upon the patients rating on the Nicotine Withdrawal Behavior Rating Scale.   Plan about smoking after patient leaves the hospital: I do not know if I'm going to quit  Interest in quitting: Moderate   Set a quit date: NO    Contact Information    If I can be of further assistance, please call [facility] [phone]
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
## 2019-05-07 ENCOUNTER — Encounter: Admit: 2019-05-07 | Discharge: 2019-05-07

## 2019-05-07 NOTE — Consults
See consult note completed today 7/6

## 2019-05-07 NOTE — Progress Notes
TREATMENT TEAM NOTE  Name: Jamie [name]        MRN: [id number]          DOB: Jun 21, 1996          Age: 23 y.o.  Admission Date: 05/05/2019             LOS: 2 days      Attendees: Psychiatry: Earle Gell. Damita Dunnings, DO  UR: Lina Sar, RN  Nursing: Marylou Flesher, RN  CM: Danielle Dess, LMSW  CM: Sherlyn Lick, [facility]  Therapy: Wetzel Bjornstad, LMLP, LCP  Therapy: Donovan Kail, Deeann Cree, [facility]  Pharmacy: Doreen Beam, PharmD  Psychology: Linton Ham. Michiel Cowboy, PhD  Psychology Intern:  Blanchie Serve, MS    Significant Points Discussed: a walk in stating he was feeling manic, visible on milieu, focused on male patient, denies all psych, refused AWAS, medication compliant; distractible during individual therapy- pt acknowledged issues with SUD and reports  willingness to engage in outpatient SUD Treatment up on discharge; mother is guardian; increasing medication and considering long acting injectable medication (UR will investigate with insurance); chronic delusions, at risk for poor treatment adherence    Treatment Plan Discussion: continue to engage in treatment

## 2019-05-07 NOTE — Progress Notes
Chart check complete

## 2019-05-07 NOTE — Progress Notes
Pt over heard a peer request to not be awakened overnight for AWAS monitoring and made the same request. He is scoring minimally and as such will not be awakened throughout the night.

## 2019-05-07 NOTE — Progress Notes
Chart check completed

## 2019-05-07 NOTE — Progress Notes
Jamie [name] was social with peers on the unit and spent most of the evening watching television. He appears to be focused on one male peer and was reminded about appropriate boundaries when this nurse saw him go into her room to see if she was in there. He denied any thoughts of SI/HI but still reported that he feels as if he is manic and his medications are not helping him yet. He stated "I don't want to discharge yet." He requested prn Trazodone with his HS medications. He is scoring minimally on AWAS assessments and will continue to be monitored q15 minutes or less for safety and behavior.

## 2019-05-07 NOTE — Progress Notes
PSYCHIATRIC PROGRESS NOTE     LOS: 2 days  Voluntary/Involuntary Status: Voluntary  Guardian? Parents        MEDICATIONS   Current Psychotropic Medications:   1. Abilify 20mg  qdaily  2. Depakote ER 1500mg  qhs   3. Folic acid 1 mg qday  4. Thiamine 100mg  qday       ASSESSMENT & DIAGNOSES   Jamie [name] is a 23 y.o. Caucasian male with a history of schizoaffective disorder who presented to [facility] ED with concerns of mania. This appears to be precipitated by medication noncompliance.  Factors that seem to have predisposed him to mental illness include genetics and substance use disorder.  This current problem is maintained by substance use and medication noncompliance. However, protective factors include supportive family, intelligence and willingness to seek treatement. Proposed treatment has consisted of pharmacological management and individualized & group therapy.    DSM-5 DIAGNOSES:  ? Schizoaffective disorder, bipolar type, current episode manic, severe, with psychotic features    ? Tobacco use disorder, mild  ? Alcohol use disorder, severe      PLAN     ? Continue depakote ER 1500mg  qhs   ? Abilify 20mg  with plan to initiate LAI (Aristada vs. Maintenna)   ? Encourage participation in ward activities     AIMS to be performed this admission.  Metabolic labs completed on 7/6: TG 165, LDL 101.    Disposition: Maintain admission for safety and stabilization.  Will go home upon discharge.    Seen and discussed with Dr. Shea Evans.    ______________________________________________________________     SUBJECTIVE     Per staff, reported he still feels manic and doesn't feel ready for discharge. Overall calm and cooperative with cares. Taking medications. Spending time in common areas, outside of room. Attending groups. Slept 6.75 hours and is scoring low on AWAS.     On encounter, patient asked this writer if he appears more coherent and less talkative today because the drugs are wearing off. He believes he has done well without medication in the past and thinks his hospitalization resulted from recent substance use. He denied feeling anxious or depressed/sad. Mood is good. Denied SI/HI/AVH. This Clinical research associate informed the patient that I spoke with his mother/guardian yesterday. Patient appeared visibly upset and asked what his mother said. Discussed with him the safety concerns expressed by his mother. Patient stated she is full of shit and is a selfish son of a bitch, he stated that the threatening statements he made were a joke and has no intention of hurting people I love or others. He believes his family is trying to control his life via guardianship and stated this has caused him to drink. He referred to his parents as manipulative and stated his will isn't being honored. Discussed with patient plan to initiate LAI; he is agreeable and stated he would like to discontinue Depakote if he starts LAI. Informed patient this option will be considered, however, guardian will make decision. Patient endorsed he hasn't put in the effort to obtain LAI because I didn't really want to take it.          REVIEW OF SYSTEMS   Review of Systems   Gastrointestinal: Negative for abdominal pain, diarrhea, nausea and vomiting.   Musculoskeletal: Negative for back pain, gait problem and myalgias.   Neurological: Negative for tremors, numbness and headaches.   Psychiatric/Behavioral: Negative for confusion, dysphoric mood, hallucinations and suicidal ideas.        OBJECTIVE  Vital Signs:  Current                Vital Signs: 24 Hour Range   BP: 138/75 (07/07 1500)  Temp: 36.9 ???C (98.4 ???F) (07/07 1500)  Pulse: 88 (07/07 1500)  Respirations: 18 PER MINUTE (07/07 1500)  SpO2: 99 % (07/07 1500) BP: (129-153)/(61-88)   Temp:  [36.3 ???C (97.3 ???F)-37.1 ???C (98.8 ???F)]   Pulse:  [56-88]   Respirations:  [17 PER MINUTE-20 PER MINUTE]   SpO2:  [98 %-100 %]    Intensity Pain Scale (Self Report): (not recorded) Scheduled Medications:  ARIPiprazole (ABILIFY) tablet 20 mg, 20 mg, Oral, QDAY  divalproex (DEPAKOTE ER) ER tablet 1,500 mg, 1,500 mg, Oral, QHS  folic acid (FOLVITE) tablet 1 mg, 1 mg, Oral, QDAY  melatonin tablet 3 mg, 3 mg, Oral, QHS  nicotine (NICODERM CQ STEP 3) 7 mg/day patch 1 patch, 1 patch, Transdermal, QDAY    And  Verification of Patch Placement and Integrity - Nicotine 7 MG/24HR, , Transdermal, BID  thiamine (VITAMIN B-1) tablet 100 mg, 100 mg, Oral, QDAY        PRN Medications:  acetaminophen Q6H PRN 650 mg at 05/05/19 0411, docusate Q12H PRN, famotidine Q12H PRN, hydrOXYzine TID PRN 25 mg at 05/06/19 2041, nicotine polacrilex Q1H PRN 2 mg at 05/07/19 0929, OLANZapine Q6H PRN **OR** OLANZapine Q6H PRN, traZODone QHS PRN 50 mg at 05/06/19 2041      Mental Status Exam:  ??? General/Constitutional: 23 y.o. male, dressed in hospital attire, appears as stated age, well-groomed  ??? Behavior: Overall calm and cooperative   ??? Speech/Motor: normal rate, rhythm and tone  ??? Eye Contact: fair  ??? Mood: good  ??? Affect: euthymic, mood congruent  ??? Thought Process: linear and logical  ??? Associations: no loose associations  ??? Thought Content: denies SI/HI  ??? Perception: denied AH/VH. Some evidence of delusions   ??? Insight/Judgment: poor/poor    ??? Orientation: clear sensorium   ??? Recent and Remote Memory: intact   ??? Attention span and concentration: poor  ??? Language: fluent  ??? Fund of knowledge and vocabulary: above average       Focused Physical Exam:  ??? Musculoskeletal: moves all 4 extremities with full ROM  ??? Neurological: no tics or tremors noted    ______________________________________________________________  Era Skeen, MD   Psychiatry PGY-2  Pager: [phone]
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
## 2019-05-07 NOTE — Progress Notes
Chart check completed.

## 2019-05-07 NOTE — Progress Notes
Service(s): Inpatient Group Psychotherapy     Patient data/presentation/complaint: Jamie [name] was seen in group therapy (1145 - 1230). He described his mood as put off today and elaborated that his work notified him that he had been let go. Stated that if he had known this he may not have needed to be admitted. Noted to be actively engaged in the session. Worked to apply material with active disclosure and good knowledge base. Responded well to material and discussion during session. No other issues noted.      MSE: casually dressed and groomed, A+Ox3, mood, put off, good range of affect, speech WNL, eye contact was WNL, TP linear and goal-directed, TC no overt (or reported) evidence of SI/HI/AH/VH, I fair-good, J fair-good, active participation in therapy     Psychosocial Intervention(s): Supportive therapy with a focus on behavior change and health promotion was provided. Response was positive     Plan: Will continue to follow on an inpatient basis.    Provider:     Lendon Ka, Ph.D., [phone]

## 2019-05-08 ENCOUNTER — Encounter: Admit: 2019-05-08 | Discharge: 2019-05-08

## 2019-05-08 MED ORDER — ARIPIPRAZOLE 10 MG PO TAB
10 mg | Freq: Once | ORAL | 0 refills | Status: CP
Start: 2019-05-08 — End: ?
  Administered 2019-05-08: 17:00:00 10 mg via ORAL

## 2019-05-08 MED ORDER — ARIPIPRAZOLE 15 MG PO TAB
30 mg | Freq: Every day | ORAL | 0 refills | Status: DC
Start: 2019-05-08 — End: 2019-05-24
  Administered 2019-05-09 – 2019-05-23 (×15): 30 mg via ORAL

## 2019-05-08 NOTE — Progress Notes
Psychology (1330 - 1430)    Service(s): Individual Counseling and Psychotherapy    Presentation: Met with Jamie [name] to provide individual support. He reported that he continues to see visual stimuli in the surrounding which can be distressing for him. Reported low motivation to continue medication once he goes back to the community. He noted that the "cost-benefit is 3 to 1" where he finds himself "less present," "less sexually attractive," and "less intelligent" when he is on medication. In conversation, he presented to be delusional and held thoughts of grandiosity. He feels confident that he can do well without the medication.    MSE: Casually dressed and groomed, A+Ox3, mood was euthymic, fair range of affect, eye contact was WNL, speech was fast and tends to interrupt mid-sentence, TP linear and goal-directed, reported delusions, VH; negative for SI, HI, AH; insight was fair, judgment was fair-poor, actively engaged in session    Psychotherapy: Individual counseling was provided. Focus of session centered on discussing values and short-term goals. He identified values of fulfillment or accomplishment, education, and wellbeing. He discussed short-term goals of getting a job and being financially stable; and he noted long-term goal of going back to school to study law. We discussed setbacks that may come up, and used problem-solving strategy to plan ahead for the setbacks.    Plan: Will continue to follow    Anette Riedel, MS
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
## 2019-05-08 NOTE — Case Management (ED)
Investigated LAI approval of antipsychotic medication. Online information for Medicare B does not cover LAI for mental illness.

## 2019-05-08 NOTE — Progress Notes
Chart check completed.

## 2019-05-08 NOTE — Progress Notes
Chart check completed

## 2019-05-08 NOTE — Progress Notes
PSYCHIATRIC PROGRESS NOTE     LOS: 3 days  Voluntary/Involuntary Status: Voluntary  Guardian? Parents        MEDICATIONS   Current Psychotropic Medications:   1. Abilify 30mg  qdaily  2. Depakote ER 1500mg  qhs   3. Folic acid 1 mg qday  4. Thiamine 100mg  qday       ASSESSMENT & DIAGNOSES   Jamie [name] is a 23 y.o. Caucasian male with a history of schizoaffective disorder who presented to [facility] ED with concerns of mania. This appears to be precipitated by medication noncompliance.  Factors that seem to have predisposed him to mental illness include genetics and substance use disorder.  This current problem is maintained by substance use and medication noncompliance. However, protective factors include supportive family, intelligence and willingness to seek treatement. Proposed treatment has consisted of pharmacological management and individualized & group therapy.    DSM-5 DIAGNOSES:  ? Schizoaffective disorder, bipolar type, current episode manic, severe, with psychotic features    ? Tobacco use disorder, mild  ? Alcohol use disorder, severe      PLAN     ? Continue depakote ER 1500mg  qhs   ? Increased Abilify to 30mg  with plan to initiate LAI (Aristada vs. Maintenna)  ? Discontinued AWAS, consecutively scoring low   ? Encourage participation in ward activities     AIMS to be performed this admission.  Metabolic labs completed on 7/6: TG 165, LDL 101.    Disposition: Maintain admission for safety and stabilization.  Will go home upon discharge.    Seen and discussed with Dr. Shea Evans.    ______________________________________________________________     SUBJECTIVE     Per staff, no acute overnight events. Overall calm and cooperative with cares. Taking medications. Spending time in common areas, outside of room. Attending groups and interacting well with others. Slept 5.75 hours and is scoring low on AWAS.     On encounter, patient reported that he slept well. He still feels upset about his family trying to control him but understands there's nothing he can do about it. Patient denied SI/HI/AH. Endorsed VH of red dots, blue dots, stars, beings. Continuous delusions of reference present.        REVIEW OF SYSTEMS   Review of Systems   Gastrointestinal: Negative for abdominal pain, diarrhea, nausea and vomiting.   Musculoskeletal: Negative for back pain.   Neurological: Negative for tremors, numbness and headaches.   Psychiatric/Behavioral: Positive for hallucinations. Negative for confusion, dysphoric mood and suicidal ideas.        OBJECTIVE                    Vital Signs:  Current                Vital Signs: 24 Hour Range   BP: 146/78 (07/08 1100)  Temp: 36.9 ???C (98.4 ???F) (07/08 1100)  Pulse: 64 (07/08 1100)  Respirations: 20 PER MINUTE (07/08 1100)  SpO2: 100 % (07/08 0900) BP: (123-146)/(72-86)   Temp:  [36.4 ???C (97.5 ???F)-37.2 ???C (98.9 ???F)]   Pulse:  [43-80]   Respirations:  [18 PER MINUTE-20 PER MINUTE]   SpO2:  [99 %-100 %]    Intensity Pain Scale (Self Report): (not recorded)      Scheduled Medications:  [START ON 05/09/2019] ARIPiprazole (ABILIFY) tablet 30 mg, 30 mg, Oral, QDAY  divalproex (DEPAKOTE ER) ER tablet 1,500 mg, 1,500 mg, Oral, QHS  folic acid (FOLVITE) tablet 1 mg, 1 mg, Oral, QDAY  melatonin tablet 3 mg, 3 mg, Oral, QHS  nicotine (NICODERM CQ STEP 3) 7 mg/day patch 1 patch, 1 patch, Transdermal, QDAY    And  Verification of Patch Placement and Integrity - Nicotine 7 MG/24HR, , Transdermal, BID  thiamine (VITAMIN B-1) tablet 100 mg, 100 mg, Oral, QDAY        PRN Medications:  acetaminophen Q6H PRN 650 mg at 05/05/19 0411, docusate Q12H PRN, famotidine Q12H PRN, hydrOXYzine TID PRN 25 mg at 05/06/19 2041, nicotine polacrilex Q1H PRN 2 mg at 05/08/19 0813, OLANZapine Q6H PRN **OR** OLANZapine Q6H PRN, traZODone QHS PRN 50 mg at 05/06/19 2041      Mental Status Exam:  ??? General/Constitutional: 23 y.o. male, dressed in hospital attire, appears as stated age, well-groomed ??? Behavior: Overall calm and cooperative   ??? Speech/Motor: normal rate, rhythm and tone  ??? Eye Contact: fair  ??? Mood: good  ??? Affect: euthymic, mood congruent  ??? Thought Process: linear and logical  ??? Associations: no loose associations  ??? Thought Content: denies SI/HI  ??? Perception: denied AH/VH. fixed delusions   ??? Insight/Judgment: poor/poor    ??? Orientation: clear sensorium   ??? Recent and Remote Memory: intact   ??? Attention span and concentration: fair  ??? Language: fluent  ??? Fund of knowledge and vocabulary: above average       Focused Physical Exam:  ??? Musculoskeletal: moves all 4 extremities with full ROM  ??? Neurological: no tics or tremors noted    ______________________________________________________________  Era Skeen, MD   Psychiatry PGY-2  Pager: [phone]

## 2019-05-08 NOTE — Care Plan
Patient seen on the unit. Patient denies pain, SI/HI/AVH.  Patient was Patient's speech and motor activity appear to be within normal limits. cooperative, and answered questions without difficulty. Patient remains on 15 minute checks for safety.  Will continue to monitor.        Problem: Discharge Planning  Goal: Participation in plan of care  Outcome: Goal Ongoing  Goal: Knowledge regarding plan of care  Outcome: Goal Ongoing  Goal: Prepared for discharge  Outcome: Goal Ongoing     Problem: Health Maintenance - Impaired  Goal: Able to perfom ADL's  Outcome: Goal Ongoing  Goal: Adequate nutritional intake  Outcome: Goal Ongoing  Goal: Establish therapeutic relationships  Outcome: Goal Ongoing  Goal: Knowledge of health maintenance  Outcome: Goal Ongoing     Problem: Mood - Altered  Goal: Stabilize mood  Outcome: Goal Ongoing  Goal: Knowledge of Altered Mood  Outcome: Goal Ongoing

## 2019-05-08 NOTE — Progress Notes
Pharmacy Long-Acting Injectable Psychotropic Medication Note    For patient Jamie [name] (MRN: [id number]), a non-formulary medication request was received for Abilify Aristada.  This medication is requested as a new medication initiation.    New Medication Initiation   Outpatient coverage verification:  No    Patient will qualify for PAP pending income documentation   History of non-compliance or lack of adherence to oral therapy:  Yes   Future outpatient administration of this medication will occur as follows:   Outpatient Location: [facility]   Next Administration Date: TBD      Pharmacy recommends use of Abilify Aristada during this hospitalization based on the above established criteria for long-acting injectable psychotropic medications for inpatient use.      Doreen Beam, [facility]  Clinical Pharmacist  05/08/2019

## 2019-05-08 NOTE — Progress Notes
Service(s): Inpatient Group Psychotherapy     Patient data/presentation/complaint: Mr. Jamie [name] was seen in group therapy (1145 - 1230). He described his mood as well-rested today. Stated that he had not been sleeping well recently and that this had improved yesterday. Remains actively engaged in the group counseling with frequent participation. No other issues noted.      MSE: casually dressed and groomed, A+Ox3, mood, well-rested fair range of affect, speech WNL, eye contact was WNL, TP linear and goal-directed, TC no overt (or reported) evidence of SI/HI/AH/VH, I fair-good, J fair-good, active participation in therapy     Psychosocial Intervention(s): Supportive therapy with a focus on ACT counseling and behavior change was provided. Response was positive     Plan: Will continue to follow on an inpatient basis.    Provider:     Lendon Ka, Ph.D., [phone]
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
## 2019-05-09 NOTE — Progress Notes
TREATMENT TEAM NOTE  Name: Jamie [name]        MRN: [id number]          DOB: 01/01/1996          Age: 23 y.o.  Admission Date: 05/05/2019             LOS: 4 days        Attendees:   Psychiatry: Earle Gell. Damita Dunnings, DO  UR: Caroline More, RN  Nursing:  Lynne Logan   CM: Neita Carp, LMSW  CM: Danielle Dess, LMSW  CM: Sherlyn Lick, LPC  Therapy: Wetzel Bjornstad, LMLP, LCP  Therapy: Jamison Oka, [facility]  Pharmacy: Doreen Beam, PharmD  Psychology: Linton Ham. Michiel Cowboy, PhD  Psychology Intern:  Blanchie Serve, MS  Psychology Intern:  Anette Riedel, MA   Psychology Intern:  Redgie Grayer, MA     Significant Points Discussed:   Endorses visual hallucinations  Anxious at times; cooperative  Staff noted that at times he becomes fixated on male patients  Reported that he likes being in a manic state  Expressed some interest in following up with outpatient services    Treatment Plan Discussion:   Continue treatment

## 2019-05-09 NOTE — Progress Notes
Chart check completed.

## 2019-05-09 NOTE — Progress Notes
A medication education group was performed today, and Jamie [name] attended the entire duration of the group.  They occasionally participated in group and demonstrated fair understanding of material discussed.    Common psychiatric medications and disease states were reviewed for indications and side effects. Discussed using [facility] for discharge and outside pharmacies after discharge to get refills on medications.Explained the different doses of Trazodone and which were better for sleep. Talked about how Methadone is dispensed from a special clinic and how it can be used for pain or opioid use disorder. Informed group that pharmacy students will go over medications prior to discharge as well as the more common side effects. Went over the importance of continuing to take medications daily while dealing with bipolar disorder rather than when just having a manic episode and how sometimes two medications are needed, such as a mood stabilizer and an antipsychotic, to get better control of our thoughts.       The patient had specific questions regarding: Why two medications like Depakote and Abilify were needed when the patient only wants to take one? If he could take the medications only when having a manic episode? What's the point of a mood stabilizer?      Will discuss with team in rounds.      Group Leader:  Rudy Jew

## 2019-05-09 NOTE — Progress Notes
PSYCHIATRIC PROGRESS NOTE     LOS: 4 days  Voluntary/Involuntary Status: Voluntary  Guardian? Parents        MEDICATIONS   Current Psychotropic Medications:   1. Abilify 30mg  qdaily  2. Depakote ER 1500mg  qhs   3. Folic acid 1 mg qday  4. Thiamine 100mg  qday       ASSESSMENT & DIAGNOSES   Jamie [name] is a 23 y.o. Caucasian male with a history of schizoaffective disorder who presented to [facility] ED with concerns of mania. This appears to be precipitated by medication noncompliance.  Factors that seem to have predisposed him to mental illness include genetics and substance use disorder. This current problem is maintained by substance use and medication noncompliance. However, protective factors include supportive family, intelligence and willingness to seek treatement. Proposed treatment has consisted of pharmacological management and individualized & group therapy.    DSM-5 DIAGNOSES:  ? Schizoaffective disorder, bipolar type, current episode manic, severe, with psychotic features    ? Tobacco use disorder, mild  ? Alcohol use disorder, severe      PLAN     ? Continue depakote ER 1500mg  qhs   ? Continue Abilify to 30mg  with plan to initiate LAI [name])  ? Continue folic acid and thiamine supplements   ? Encourage participation in ward activities     AIMS to be performed this admission.  Metabolic labs completed on 7/6: TG 165, LDL 101.    Disposition: Maintain admission for safety and stabilization. Will go home upon discharge.    Seen and discussed with Dr. Shea Evans.    ______________________________________________________________     SUBJECTIVE     Per staff, no acute overnight events. Overall calm and cooperative with cares. Taking medications. Spending time in common areas, outside of room. Attending groups and interacting well with others.     On encounter, patient feels great and asked this writer do you think I am doing better and ready for discharge. Discussed with patient importance of daily assessments, discussions with his guardians and need for LAI. Patient is agreeable to plan. He denies SI/HI/AH. Continuous VH of colored dots; which aren't distressing to him. Evidence of fixed delusions of red moon communicating with him. Denies notable side effects from medications. Patient was informed of his parents' request for a level II screening. He was very upset about this and stated that his mother is a piece of shit and wants to control my life. He believes that he is capable of living independently and doesn't really need medications to function. Discussed that he may or may not meet criteria for that screening but will continue to be monitored while on the unit.             REVIEW OF SYSTEMS   Review of Systems   Gastrointestinal: Negative for abdominal pain, diarrhea, nausea and vomiting.   Neurological: Negative for tremors, numbness and headaches.   Psychiatric/Behavioral: Positive for hallucinations. Negative for confusion, dysphoric mood and suicidal ideas.        OBJECTIVE                    Vital Signs:  Current                Vital Signs: 24 Hour Range   BP: 143/81 (07/08 2010)  Temp: 36.8 ???C (98.2 ???F) (07/08 2010)  Pulse: 60 (07/08 2010)  Respirations: 18 PER MINUTE (07/08 2010)  SpO2: 99 % (07/08 2010) BP: (113-146)/(70-81)   Temp:  [  36.8 ???C (98.2 ???F)-36.9 ???C (98.4 ???F)]   Pulse:  [60-77]   Respirations:  [18 PER MINUTE-20 PER MINUTE]   SpO2:  [96 %-100 %]    Intensity Pain Scale (Self Report): (not recorded)      Scheduled Medications:  ARIPiprazole (ABILIFY) tablet 30 mg, 30 mg, Oral, QDAY  divalproex (DEPAKOTE ER) ER tablet 1,500 mg, 1,500 mg, Oral, QHS  folic acid (FOLVITE) tablet 1 mg, 1 mg, Oral, QDAY  melatonin tablet 3 mg, 3 mg, Oral, QHS  nicotine (NICODERM CQ STEP 3) 7 mg/day patch 1 patch, 1 patch, Transdermal, QDAY    And  Verification of Patch Placement and Integrity - Nicotine 7 MG/24HR, , Transdermal, BID  thiamine (VITAMIN B-1) tablet 100 mg, 100 mg, Oral, QDAY PRN Medications:  acetaminophen Q6H PRN 650 mg at 05/05/19 0411, docusate Q12H PRN, famotidine Q12H PRN, hydrOXYzine TID PRN 25 mg at 05/06/19 2041, nicotine polacrilex Q1H PRN 2 mg at 05/08/19 0813, OLANZapine Q6H PRN **OR** OLANZapine Q6H PRN, traZODone QHS PRN 50 mg at 05/06/19 2041      Mental Status Exam:  ??? General/Constitutional: 23 y.o. male, dressed in hospital attire, appears as stated age, well-groomed  ??? Behavior: Overall calm and cooperative   ??? Speech/Motor: normal rate, rhythm and tone  ??? Eye Contact: fair  ??? Mood: good  ??? Affect: euthymic, mood congruent  ??? Thought Process: linear and logical  ??? Associations: no loose associations  ??? Thought Content: denies SI/HI  ??? Perception: denied AH/VH. fixed delusions   ??? Insight/Judgment: poor/poor    ??? Orientation: clear sensorium   ??? Recent and Remote Memory: intact   ??? Attention span and concentration: fair  ??? Language: fluent  ??? Fund of knowledge and vocabulary: above average       Focused Physical Exam:  ??? Musculoskeletal: moves all 4 extremities with full ROM  ??? Neurological: no tics or tremors noted    ______________________________________________________________  Era Skeen, MD   Psychiatry PGY-2  Pager: [phone]

## 2019-05-09 NOTE — Care Plan
Pt has been out in the milieu.Had a visitor this evening.Says his visual hallucinations are getting worse and is used to them for the last 2 years. Bright affect,calm,co-operative and med compliant.Denies si/hi/ah/feeling depressed/anxiety.

## 2019-05-09 NOTE — Progress Notes
Service(s): Inpatient Group Psychotherapy     Patient data/presentation/complaint: Jamie [name] was seen in group therapy (1145 - 1230). He described his mood as well today. However, he also expressed that he was feeling frustrated due to of the potential for extended hospitalization/placement. Despite this he remains actively engaged in group counseling. Discussed negative effects of alcohol use in his life with application of group material. Responded well to material and discussion during session. No other issues noted.      MSE: casually dressed and groomed, A+Ox3, mood, well bright affect, speech WNL, eye contact was WNL, TP linear and goal-directed, TC no overt (or reported) evidence of SI/HI/AH/VH, I fair-good, J fair-good, active participation in therapy     Psychosocial Intervention(s): Supportive therapy with a focus on behavior change and health promotion was provided. Response was positive     Plan: Will continue to follow on an inpatient basis.    Provider:     Lendon Ka, Ph.D., [phone]

## 2019-05-09 NOTE — Med Student Progress Note
Medical Student Progress Note -  Inpatient    NAME: Jamie [name]                                     MRN: [id number]                 DOB: [birth year]          AGE: 23 y.o.  ADMISSION DATE: 05/05/2019             DAYS ADMITTED: LOS: 4 days        PSYCHIATRIC PROGRESS NOTE     LOS: 4 days  Voluntary/Involuntary Status: Involuntary     MEDICATIONS   Current Psychotropic Medications:   1. Aripiprazole tab 10 mg, qday  2. Divalproex tab 1500 mg oral qhs  3. Folic Acid tab 1 mg oral, qday  4. Nicotine patch 7mg /day transdermal, qday  5. Thiamine tab 100mg  oral, qday       ASSESSMENT & DIAGNOSES   Jamie [name] is a 23 y.o. Caucasian male with a history of schizoaffective disorder bipolar type who presented to [facility] yesterday with concerns of a manic episode post binge drinking. This appears to be precipitated by medication non-compliance, alcohol use and numerous illicit substances.  Factors that seem to have predisposed him to schizoaffective disorder bipolar type include medication non-compliance.  Proposed treatment has consisted of medications, individualized & group therapy.    DSM-5 DIAGNOSES:  - Schizoaffective disorder, bipolar type with manic episode   -Tobacco use disorder, mild  -Alcohol use disorder, severe  -Cannabis use disorder       PLAN     > Maintain admission for psychiatric safety, stabilization, monitoring, and treatment.  > Continue Aripiprazole 30mg  PO daily and try to transition to a long acting injectable pending PAP income documentation   > Valproic acid 1500mg  PO qHS   > PRN Olanzapine for severe agitation  > Nicotine replacement patch   > Melatonin 3 mg  > Thiamine tab 100 mg      Metabolic labs completed on 7/6.    Disposition: Maintain admission for safety and stabilization.      ______________________________________________________________     SUBJECTIVE     Per staff, patient is without significant overnight events. Spending time in common areas, outside of room. Attending groups.     On encounter, pt was talking with fellow pts and playing cards in the group area. Stated that his mood was improved and that he believed this manic episode was due to his drug and alcohol use and not due to medication non-compliance. Pt reported significant side effects on the Divalproex and that he stated he wouldn't take it when he leaves but was agreeable to the IV Abilify. Discussed that parents were guardians and that they ultimately would be making decisions about medication changes.        OBJECTIVE                    Vital Signs:  Current                Vital Signs: 24 Hour Range   BP: 128/72 (07/09 0700)  Temp: 37.1 ???C (98.8 ???F) (07/09 0700)  Pulse: 53 (07/09 0700)  Respirations: 18 PER MINUTE (07/09 0700)  SpO2: 100 % (07/09 0700) BP: (113-143)/(70-81)   Temp:  [36.8 ???C (98.2 ???F)-37.1 ???C (98.8 ???F)]   Pulse:  [  53-77]   Respirations:  [18 PER MINUTE]   SpO2:  [96 %-100 %]    Intensity Pain Scale (Self Report): (not recorded)      Scheduled Medications:  ARIPiprazole (ABILIFY) tablet 30 mg, 30 mg, Oral, QDAY  divalproex (DEPAKOTE ER) ER tablet 1,500 mg, 1,500 mg, Oral, QHS  folic acid (FOLVITE) tablet 1 mg, 1 mg, Oral, QDAY  melatonin tablet 3 mg, 3 mg, Oral, QHS  nicotine (NICODERM CQ STEP 3) 7 mg/day patch 1 patch, 1 patch, Transdermal, QDAY    And  Verification of Patch Placement and Integrity - Nicotine 7 MG/24HR, , Transdermal, BID  thiamine (VITAMIN B-1) tablet 100 mg, 100 mg, Oral, QDAY        PRN Medications:  acetaminophen Q6H PRN 650 mg at 05/05/19 0411, docusate Q12H PRN, famotidine Q12H PRN, hydrOXYzine TID PRN 25 mg at 05/06/19 2041, nicotine polacrilex Q1H PRN 2 mg at 05/09/19 1127, OLANZapine Q6H PRN **OR** OLANZapine Q6H PRN, traZODone QHS PRN 50 mg at 05/06/19 2041    Mental Status Exam:  ??? General/Constitutional:23 year old male resting comfortably   ??? Speech/Motor: normal rate and volume   ??? Eye Contact: eye contact present ??? Mood: slightly euphoric   ??? Affect:expansive and full   ??? Thought Process: coherent   ??? Thought Content: denied SI/HI  ??? Perception: denied auditory hallucinations, reported seeing floaters in the shapes of circles, dots and stars along with a rainbow.   ??? Insight/Judgment: intact/intact  ??? Orientation: A&O x 3  ??? Recent and Remote Memory: intact   ??? Attention span and concentration: intact   ??? Language: fluent   Fund of knowledge and vocabulary: normal   ???   ______________________________________________________________  Tonye Pearson, MS3

## 2019-05-09 NOTE — Care Plan
Problem: Discharge Planning  Goal: Participation in plan of care  Outcome: Goal Ongoing  Goal: Knowledge regarding plan of care  Outcome: Goal Ongoing  Goal: Prepared for discharge  Outcome: Goal Ongoing   Compliant with medication and care plan. Denies SI/HI/ AVH/ pain at this time. Denies anxiety or depression at this time. Eating and drinking well. No violence or harm noted towards self or other at this time. Met with treatment team. Cooperative and out in thew milieu participating in groups. Inacting with staff and peer appropriately.   Problem: Health Maintenance - Impaired  Goal: Able to perfom ADL's  Outcome: Goal Ongoing  Goal: Adequate nutritional intake  Outcome: Goal Ongoing  Goal: Establish therapeutic relationships  Outcome: Goal Ongoing  Goal: Knowledge of health maintenance  Outcome: Goal Ongoing     Problem: Mood - Altered  Goal: Stabilize mood  Outcome: Goal Ongoing  Goal: Knowledge of Altered Mood  Outcome: Goal Ongoing     Problem: Self-esteem - Low  Goal: Demonstration of positive self-esteem  Outcome: Goal Ongoing  Goal: Knowledge of low self-esteem  Outcome: Goal Ongoing     Problem: Thought Process - Altered  Goal: Demonstration of organized thought processes  Outcome: Goal Ongoing  Goal: Knowledge of altered thought process  Outcome: Goal Ongoing     Problem: Harm to Self/Others, Risk of (Non-Psychiatric Patient)  Goal: Absence of Harm to Self/Others  Outcome: Goal Ongoing     Problem: Violence, self/other-directed, Risk of  Goal: Absence of violence  Outcome: Goal Ongoing  Goal: Knowledge of risk for violence, self/other-directed  Outcome: Goal Ongoing     Problem: Tobacco Use  Goal: Knowledge of tobacco-use cessation methods  Outcome: Goal Ongoing

## 2019-05-10 LAB — VALPROIC ACID LEVEL: Lab: 65 ug/mL (ref >60)

## 2019-05-10 MED ORDER — ARIPIPRAZOLE LAUROXIL 882 MG/3.2 ML IM SERS
882 mg | Freq: Once | INTRAMUSCULAR | 0 refills | Status: CP
Start: 2019-05-10 — End: ?
  Administered 2019-05-10: 23:00:00 882 mg via INTRAMUSCULAR

## 2019-05-10 NOTE — Progress Notes
Chart check completed.

## 2019-05-10 NOTE — Progress Notes
PSYCHIATRIC PROGRESS NOTE     LOS: 5 days  Voluntary/Involuntary Status: Voluntary  Guardian? Parents      MEDICATIONS   Current Psychotropic Medications:   1. Abilify 30mg  qdaily  2. Depakote ER 1500mg  qhs   3. Folic acid 1 mg qday  4. Thiamine 100mg  qday     ASSESSMENT & DIAGNOSES   Yaiden Yang is a 23 y.o. Caucasian male with a history of schizoaffective disorder who presented to [facility] ED with concerns of mania. This appears to be precipitated by medication noncompliance.  Factors that seem to have predisposed him to mental illness include genetics and substance use disorder. This current problem is maintained by substance use and medication noncompliance. However, protective factors include supportive family, intelligence and willingness to seek treatement. Proposed treatment has consisted of pharmacological management and individualized & group therapy.    DSM-5 DIAGNOSES:  ? Schizoaffective disorder, bipolar type, current episode manic, severe, with psychotic features    ? Tobacco use disorder, mild  ? Alcohol use disorder, severe      PLAN     ? Continue depakote ER 1500mg  qhs   ? Plan to receive LAI Aristada today, 05/10/19   ? Continue folic acid and thiamine supplements   ? Encourage participation in ward activities     AIMS to be performed this admission.  Metabolic labs completed on 7/6: TG 165, LDL 101.    Disposition: Maintain admission for safety and stabilization. Will go home upon discharge.    Seen and discussed with Dr. Shea Evans.    ______________________________________________________________     SUBJECTIVE     Per staff, no acute overnight events. Overall calm and cooperative with cares. Taking medications. Spending time in common areas, outside of room. Attending groups and interacting well with others. Slept 6.5 hours.     On encounter, patient was seen this morning playing a board game with his peers. He reported that he is coming to terms that my parents are doing what they thin is right for me even if it's not what I want. Patient stated he is open to receiving the LAI but will not take the depakote because of metabolic profile. He denied current SI/HI/AVH.             REVIEW OF SYSTEMS   Review of Systems   Gastrointestinal: Negative for abdominal pain, diarrhea, nausea and vomiting.   Neurological: Negative for tremors, numbness and headaches.   Psychiatric/Behavioral: Negative for confusion, dysphoric mood and suicidal ideas.      OBJECTIVE                    Vital Signs:  Current                Vital Signs: 24 Hour Range   BP: 131/72 (07/10 0700)  Temp: 36.7 ???C (98.1 ???F) (07/10 0700)  Pulse: 50 (07/10 0700)  Respirations: 18 PER MINUTE (07/10 0700)  SpO2: 100 % (07/10 0700) BP: (131-146)/(72-74)   Temp:  [36.7 ???C (98.1 ???F)-36.9 ???C (98.4 ???F)]   Pulse:  [50-56]   Respirations:  [18 PER MINUTE]   SpO2:  [99 %-100 %]    Intensity Pain Scale (Self Report): (not recorded)      Scheduled Medications:  ARIPiprazole (ABILIFY) tablet 30 mg, 30 mg, Oral, QDAY  divalproex (DEPAKOTE ER) ER tablet 1,500 mg, 1,500 mg, Oral, QHS  folic acid (FOLVITE) tablet 1 mg, 1 mg, Oral, QDAY  melatonin tablet 3 mg, 3 mg, Oral,  QHS  nicotine (NICODERM CQ STEP 3) 7 mg/day patch 1 patch, 1 patch, Transdermal, QDAY    And  Verification of Patch Placement and Integrity - Nicotine 7 MG/24HR, , Transdermal, BID  thiamine (VITAMIN B-1) tablet 100 mg, 100 mg, Oral, QDAY      PRN Medications:  acetaminophen Q6H PRN 650 mg at 05/05/19 0411, docusate Q12H PRN, famotidine Q12H PRN, hydrOXYzine TID PRN 25 mg at 05/06/19 2041, nicotine polacrilex Q1H PRN 2 mg at 05/10/19 0854, OLANZapine Q6H PRN **OR** OLANZapine Q6H PRN, traZODone QHS PRN 50 mg at 05/06/19 2041      Mental Status Exam:  ??? General/Constitutional: 23 y.o. male, dressed in hospital attire, appears as stated age, well-groomed  ??? Behavior: Overall calm and cooperative   ??? Speech/Motor: normal rate, rhythm and tone  ??? Eye Contact: fair ??? Mood: great  ??? Affect: cheerful, mood congruent  ??? Thought Process: linear and logical  ??? Associations: no loose associations  ??? Thought Content: denies SI/HI  ??? Perception: denied AH/VH.   ??? Insight/Judgment: fair/fair    ??? Orientation: clear sensorium   ??? Recent and Remote Memory: intact   ??? Attention span and concentration: fair  ??? Language: fluent  ??? Fund of knowledge and vocabulary: above average       Focused Physical Exam:  ??? Musculoskeletal: moves all 4 extremities with full ROM  ??? Neurological: no tics or tremors noted    ______________________________________________________________  Era Skeen, MD   Psychiatry PGY-2  Pager: [phone]

## 2019-05-10 NOTE — Progress Notes
Chart check completed

## 2019-05-10 NOTE — Discharge Instructions - Supplementary Instructions
Aripiprazole Lauroxil 882 mg Injection  Dose administered on 05/10/19  NEXT DOSE DUE IN 4-6WEEKS DEPENDING ON SYMPTOM CONTROL

## 2019-05-10 NOTE — Progress Notes
Jamie [name] was in the dining room most of the evening and interacted appropriately with peers. He denied SI/HI and stated he is making new friends tonight since so many people discharged today. He reported that he thinks if he quits drinking he could use playing chess as a coping mechanism after discharge. Pt complied with all scheduled medications and has so far not requested any prns. Staff informed pt of morning lab draw.

## 2019-05-10 NOTE — Care Plan
Pt with bright affect,calm,co-operative.Says he has visual hallucinations of seeing things parallel and shadows.He says he has had this for 2 years.Denies si/hi/depression/ah.Feels unwanted by his parents because they want him to go to a group home and he just wants to go to his apartment.Med compliant and went to groups and ate well.

## 2019-05-10 NOTE — Progress Notes
TREATMENT TEAM NOTE  Name: Octavius Shin        MRN: [id number]          DOB: 06/16/1996          Age: 23 y.o.  Admission Date: 05/05/2019             LOS: 5 days      Attendees:   Psychiatry: Carmie End, DO  UR: Caroline More, RN  Nursing: Emilia Beck, RN, MSN  CM: Neita Carp, LMSW  CM: Danielle Dess, LMSW  Therapy: Donovan Kail, LSCSW, [facility]  Clinical Manager: Sherrilee Gilles, LSCSW  Pharmacy: Doreen Beam, PharmD    Significant Points Discussed: visible on milieu, medication compliant, cm will follow up with patients mother, awaiting Long acting injectable, CM needs to follow up on financial supports to continue being able to receive injection outpatient.     Treatment Plan Discussion: continue to engage in treatment

## 2019-05-10 NOTE — Progress Notes
Case Management Progress Note    NAME: Jamie [name] MRN: [id number] DOB:January 27, 1996 AGE:23 y.o.      Date of Service: 05/10/2019  Service Start Time:    Service End Time:      15 - Clinician called patient's mother to touch base regarding Level I/II facilities, went to voicemail, clinician left message requesting return call.

## 2019-05-11 NOTE — Progress Notes
Nykeem was social and appropriate with peers on the unit. He spent most of the evening playing chess. Requested prn nicotine gum with his scheduled medications. Denied SI/HI but admitted to feeling "stressed" today when he found out they want him to consider going to a group home. Pt stated he thinks he can just stay away from drinking and be fine without a group home. Requested prn Trazodone to help with racing thoughts at bedtime. Will continue to monitor.

## 2019-05-11 NOTE — Progress Notes
PSYCHIATRIC PROGRESS NOTE     LOS: 6 days  Voluntary/Involuntary Status: Voluntary  Guardian? Parents      MEDICATIONS   Current Psychotropic Medications:   1. Abilify 30mg  qdaily  2. Depakote ER 1500mg  qhs   3. Folic acid 1 mg qday  4. Thiamine 100mg  qday  5. Aristada 882 mg LAI on 7/10     ASSESSMENT & DIAGNOSES   Jamie [name] is a 23 y.o. Caucasian male with a history of schizoaffective disorder who presented to [facility] ED with concerns of mania. This appears to be precipitated by medication noncompliance.  Factors that seem to have predisposed him to mental illness include genetics and substance use disorder. This current problem is maintained by substance use and medication noncompliance. However, protective factors include supportive family, intelligence and willingness to seek treatement. Proposed treatment has consisted of pharmacological management and individualized & group therapy.    DSM-5 DIAGNOSES:  ? Schizoaffective disorder, bipolar type, current episode manic, severe, with psychotic features    ? Tobacco use disorder, mild  ? Alcohol use disorder, severe      PLAN     ? No changes to medications on 7/11, determine length of PO supplementation and next injection before discharge    Disposition: Maintain admission for safety and stabilization. Will go home upon discharge.    Seen and discussed with Dr. Gilmore Laroche  ______________________________________________________________     SUBJECTIVE     Per staff patient took all meds, no PRNs, slpet 8 hrs, ate 100% of breakfast. He played chess in the evening, was actively involved in groups, voiced concern about going to a group home on discharge. He is seen by this interviewer during tech time. He is listening to a radio show on his phone while playing a game. He says his mood is good he is eating a sleeping well. He was asking about his PO Abilify and how long he will have to continue this. He does have some arm soreness form yesterdays injection. He denies Si, hi, AVH.           REVIEW OF SYSTEMS   Review of Systems   Musculoskeletal: Positive for myalgias. Negative for neck pain.   Neurological: Negative for dizziness and headaches.   Psychiatric/Behavioral: Negative for hallucinations and suicidal ideas.        OBJECTIVE                    Vital Signs:  Current                Vital Signs: 24 Hour Range   BP: 121/90 (07/11 0800)  Temp: 36.8 ???C (98.2 ???F) (07/11 0800)  Pulse: 85 (07/11 0800)  Respirations: 18 PER MINUTE (07/11 0800)  SpO2: 92 % (07/11 0800) BP: (121-137)/(72-90)   Temp:  [36.8 ???C (98.2 ???F)-36.9 ???C (98.4 ???F)]   Pulse:  [66-85]   Respirations:  [16 PER MINUTE-18 PER MINUTE]   SpO2:  [92 %-99 %]    Intensity Pain Scale (Self Report): (not recorded)      Scheduled Medications:  ARIPiprazole (ABILIFY) tablet 30 mg, 30 mg, Oral, QDAY  divalproex (DEPAKOTE ER) ER tablet 1,500 mg, 1,500 mg, Oral, QHS  folic acid (FOLVITE) tablet 1 mg, 1 mg, Oral, QDAY  melatonin tablet 3 mg, 3 mg, Oral, QHS  nicotine (NICODERM CQ STEP 3) 7 mg/day patch 1 patch, 1 patch, Transdermal, QDAY    And  Verification of Patch Placement and Integrity - Nicotine 7 MG/24HR, ,  Transdermal, BID  thiamine (VITAMIN B-1) tablet 100 mg, 100 mg, Oral, QDAY      PRN Medications:  acetaminophen Q6H PRN 650 mg at 05/05/19 0411, docusate Q12H PRN, famotidine Q12H PRN, hydrOXYzine TID PRN 25 mg at 05/06/19 2041, nicotine polacrilex Q1H PRN 2 mg at 05/11/19 1020, OLANZapine Q6H PRN **OR** OLANZapine Q6H PRN, traZODone QHS PRN 50 mg at 05/10/19 2105      MENTAL STATUS EXAMINATION  General/Constitutional: appears stated age, dressed in personal clothes, fair grooming  Eye Contact: good  Behavior: Calm, cooperative; appropriate for conversation  Speech: RRR with normal volume and tone. Good articulation  Mood: good  Affect: euthymic; bright ; mood congruent  Thought Process: Linear and goal directed  Thought Content: denies SI, HI.   Perception: Denies AVH  Associations: Intact Insight/Judgment: fair/fair    Orientation: AAOx3 (name/year/location)  Recent and remote memory: grossly intact  Attention span and concentration: appropriate for conversation  Cognition: below average  Language: english, fluent  Fund of knowledge and vocabulary: average     Physical Exam:  Neuro: No gross neurologic deficits.   Musculoskeletal: Moves all four extremities spontaneously  ???    ______________________________________________________________  Troy Sine, MD

## 2019-05-11 NOTE — Progress Notes
Chart check completed

## 2019-05-11 NOTE — Care Plan
Out in milieu.  Interacting with peers.  Attending groups.  Denies SI/HI or AH.  States always has VH.  Denies pain.  States slept well.  Appetite good.    Problem: Discharge Planning  Goal: Participation in plan of care  Outcome: Goal Ongoing  Goal: Knowledge regarding plan of care  Outcome: Goal Ongoing  Goal: Prepared for discharge  Outcome: Goal Ongoing     Problem: Health Maintenance - Impaired  Goal: Able to perfom ADL's  Outcome: Goal Ongoing  Goal: Adequate nutritional intake  Outcome: Goal Ongoing  Goal: Establish therapeutic relationships  Outcome: Goal Ongoing  Goal: Knowledge of health maintenance  Outcome: Goal Ongoing     Problem: Mood - Altered  Goal: Stabilize mood  Outcome: Goal Ongoing  Goal: Knowledge of Altered Mood  Outcome: Goal Ongoing     Problem: Thought Process - Altered  Goal: Demonstration of organized thought processes  Outcome: Goal Ongoing  Goal: Knowledge of altered thought process  Outcome: Goal Ongoing     Problem: Violence, self/other-directed, Risk of  Goal: Absence of violence  Outcome: Goal Ongoing  Goal: Knowledge of risk for violence, self/other-directed  Outcome: Goal Ongoing     Problem: Harm to Self/Others, Risk of (Non-Psychiatric Patient)  Goal: Absence of Harm to Self/Others  Outcome: Goal Ongoing     Problem: Tobacco Use  Goal: Knowledge of tobacco-use cessation methods  Outcome: Goal Ongoing

## 2019-05-11 NOTE — Progress Notes
Chart check completed.

## 2019-05-12 NOTE — Progress Notes
PSYCHIATRIC PROGRESS NOTE     LOS: 7 days  Voluntary/Involuntary Status: Voluntary  Guardian? Parents      MEDICATIONS   Current Psychotropic Medications:   1. Abilify 30mg  qdaily  2. Depakote ER 1500mg  qhs   3. Folic acid 1 mg qday  4. Thiamine 100mg  qday     ASSESSMENT & DIAGNOSES   Jamie [name] is a 24 y.o. Caucasian male with a history of schizoaffective disorder who presented to [facility] ED with concerns of mania. This appears to be precipitated by medication noncompliance.  Factors that seem to have predisposed him to mental illness include genetics and substance use disorder. This current problem is maintained by substance use and medication noncompliance. However, protective factors include supportive family, intelligence and willingness to seek treatement. Proposed treatment has consisted of pharmacological management and individualized & group therapy.    DSM-5 DIAGNOSES:  ? Schizoaffective disorder, bipolar type, current episode manic, severe, with psychotic features    ? Tobacco use disorder, mild  ? Alcohol use disorder, severe      PLAN     ? No changes made to psychotropic medications 05/12/19   ? Continue depakote ER 1500mg  qhs   ? S/p LAI Aristada 05/10/19 with plans to taper off Abilify   ? Continue folic acid and thiamine supplements   ? Encourage participation in ward activities     AIMS to be performed this admission.  Metabolic labs completed on 7/6: TG 165, LDL 101.    Disposition: Maintain admission for safety and stabilization. Will go home upon discharge.    Seen and discussed with Dr. Gilmore Laroche.    ______________________________________________________________     SUBJECTIVE     Per staff, no acute overnight events. Overall calm and cooperative with cares. Taking medications. Spending time in common areas, outside of room. Attending groups and interacting well with others. Slept 7.0 hours.     On encounter, patient reported feeling anxious over possibility of being put in a group home after the level II screening. Patient has had time to reflect on actions that led to hospitalization. He insightfully stated that alcohol/drugs and his mental health illness is not a good combination. He intends on ceasing drug use and avoiding friends who are bad influence. Patient reported distress over [facility] he compared his VH to constantly present strobing lights. He wishes his parents would understand how distressing this is for him. He denied SI/HI/AH.           REVIEW OF SYSTEMS   Review of Systems   Gastrointestinal: Negative for abdominal pain, diarrhea, nausea and vomiting.   Neurological: Negative for tremors, numbness and headaches.   Psychiatric/Behavioral: Negative for confusion, dysphoric mood and suicidal ideas. Positive VH     OBJECTIVE                    Vital Signs:  Current                Vital Signs: 24 Hour Range   BP: 121/78 (07/12 0700)  Temp: 36.8 ???C (98.2 ???F) (07/12 0700)  Pulse: 73 (07/12 0700)  Respirations: 16 PER MINUTE (07/12 0700)  SpO2: 100 % (07/12 0700) BP: (121-131)/(76-78)   Temp:  [36.8 ???C (98.2 ???F)-36.9 ???C (98.4 ???F)]   Pulse:  [73-95]   Respirations:  [16 PER MINUTE-18 PER MINUTE]   SpO2:  [99 %-100 %]    Intensity Pain Scale (Self Report): (not recorded)      Scheduled Medications:  ARIPiprazole (  ABILIFY) tablet 30 mg, 30 mg, Oral, QDAY  divalproex (DEPAKOTE ER) ER tablet 1,500 mg, 1,500 mg, Oral, QHS  folic acid (FOLVITE) tablet 1 mg, 1 mg, Oral, QDAY  melatonin tablet 3 mg, 3 mg, Oral, QHS  nicotine (NICODERM CQ STEP 3) 7 mg/day patch 1 patch, 1 patch, Transdermal, QDAY    And  Verification of Patch Placement and Integrity - Nicotine 7 MG/24HR, , Transdermal, BID  thiamine (VITAMIN B-1) tablet 100 mg, 100 mg, Oral, QDAY      PRN Medications:  acetaminophen Q6H PRN 650 mg at 05/05/19 0411, docusate Q12H PRN, famotidine Q12H PRN, hydrOXYzine TID PRN 25 mg at 05/06/19 2041, nicotine polacrilex Q1H PRN 2 mg at 05/12/19 1037, OLANZapine Q6H PRN **OR** OLANZapine Q6H PRN, traZODone QHS PRN 50 mg at 05/11/19 2049      Mental Status Exam:  ??? General/Constitutional: 23 y.o. male, dressed in hospital attire, appears as stated age, well-groomed  ??? Behavior: Overall calm and cooperative   ??? Speech/Motor: normal rate, rhythm and tone  ??? Eye Contact: fair  ??? Mood: nervous  ??? Affect: worried, mood congruent  ??? Thought Process: linear and logical  ??? Associations: no loose associations  ??? Thought Content: denies SI/HI  ??? Perception: denied AH. Endorsed chronic VH of shapes/dots/colors.   ??? Insight/Judgment: fair/fair    ??? Orientation: clear sensorium   ??? Recent and Remote Memory: intact   ??? Attention span and concentration: fair  ??? Language: fluent  ??? Fund of knowledge and vocabulary: above average       Focused Physical Exam:  ??? Musculoskeletal: moves all 4 extremities with full ROM  ??? Neurological: no tics or tremors noted    ______________________________________________________________  Era Skeen, MD   Psychiatry PGY-2  Pager: [phone]

## 2019-05-12 NOTE — Care Plan
Meeting with treatment team daily. Patient compliant with vital signs, breakfast and medications.  Attends unit activities and groups with appropriate behavior with peers and staff. Denies SI, HI.  No harm to self or others on unit. Patient oriented x4.  Able to ask to have needs met appropriately.  Denies AH, VH. Patient mood and behavior stable at this time. Voices understanding of plan of care and need for continued treatment. However, is not happy with his parents for controlling" his life. Will continue to monitor  Problem: Discharge Planning  Goal: Participation in plan of care  Outcome: Goal Ongoing  Goal: Knowledge regarding plan of care  Outcome: Goal Ongoing  Goal: Prepared for discharge  Outcome: Goal Ongoing     Problem: Health Maintenance - Impaired  Goal: Able to perfom ADL's  Outcome: Goal Ongoing  Goal: Adequate nutritional intake  Outcome: Goal Ongoing  Goal: Establish therapeutic relationships  Outcome: Goal Ongoing  Goal: Knowledge of health maintenance  Outcome: Goal Ongoing     Problem: Mood - Altered  Goal: Stabilize mood  Outcome: Goal Ongoing  Goal: Knowledge of Altered Mood  Outcome: Goal Ongoing     Problem: Self-esteem - Low  Goal: Demonstration of positive self-esteem  Outcome: Goal Ongoing  Goal: Knowledge of low self-esteem  Outcome: Goal Ongoing     Problem: Thought Process - Altered  Goal: Demonstration of organized thought processes  Outcome: Goal Ongoing  Goal: Knowledge of altered thought process  Outcome: Goal Ongoing     Problem: Violence, self/other-directed, Risk of  Goal: Absence of violence  Outcome: Goal Ongoing  Goal: Knowledge of risk for violence, self/other-directed  Outcome: Goal Ongoing     Problem: Harm to Self/Others, Risk of (Non-Psychiatric Patient)  Goal: Absence of Harm to Self/Others  Outcome: Goal Ongoing     Problem: Tobacco Use  Goal: Knowledge of tobacco-use cessation methods  Outcome: Goal Ongoing

## 2019-05-12 NOTE — Progress Notes
Chart check completed.

## 2019-05-12 NOTE — Progress Notes
Chart check

## 2019-05-13 MED ORDER — DIVALPROEX 500 MG PO TB24
1000 mg | Freq: Every evening | ORAL | 0 refills | Status: DC
Start: 2019-05-13 — End: 2019-05-17
  Administered 2019-05-14 – 2019-05-17 (×5): 1000 mg via ORAL

## 2019-05-13 NOTE — Progress Notes
Chart check completed.

## 2019-05-13 NOTE — Care Plan
Patient denies SI/HI/A/VH and pain.  Patient reports mood as "fine but tired".  Patient out in milieu and attending groups.  At times he is invasive of personal boundaries and speaks tangentially, but overall appropriate and medication compliant.  Patient meeting with treatment team daily and discharge plan is ongoing.      Problem: Discharge Planning  Goal: Participation in plan of care  Outcome: Goal Ongoing  Goal: Knowledge regarding plan of care  Outcome: Goal Ongoing  Goal: Prepared for discharge  Outcome: Goal Ongoing     Problem: Health Maintenance - Impaired  Goal: Able to perfom ADL's  Outcome: Goal Ongoing  Goal: Adequate nutritional intake  Outcome: Goal Ongoing  Goal: Establish therapeutic relationships  Outcome: Goal Ongoing  Goal: Knowledge of health maintenance  Outcome: Goal Ongoing     Problem: Mood - Altered  Goal: Stabilize mood  Outcome: Goal Ongoing  Goal: Knowledge of Altered Mood  Outcome: Goal Ongoing     Problem: Self-esteem - Low  Goal: Demonstration of positive self-esteem  Outcome: Goal Ongoing  Goal: Knowledge of low self-esteem  Outcome: Goal Ongoing     Problem: Thought Process - Altered  Goal: Demonstration of organized thought processes  Outcome: Goal Ongoing  Goal: Knowledge of altered thought process  Outcome: Goal Ongoing     Problem: Violence, self/other-directed, Risk of  Goal: Absence of violence  Outcome: Goal Ongoing  Goal: Knowledge of risk for violence, self/other-directed  Outcome: Goal Ongoing     Problem: Harm to Self/Others, Risk of (Non-Psychiatric Patient)  Goal: Absence of Harm to Self/Others  Outcome: Goal Ongoing     Problem: Tobacco Use  Goal: Knowledge of tobacco-use cessation methods  Outcome: Goal Ongoing

## 2019-05-13 NOTE — Progress Notes
This Probation officer spoke to the patient's mother/guradian, Samaad Hashem, on 05/13/19 at the home phone [phone]    Mother expressed some frustration that she has not been able to contact her son during this admission. She reported the code given to her by nursing staff is invalid. She also reported the password to access the patient's record has been changed. Mother suspects the patient has made these changes and is isolating himself from the family. She was informed about update to treatment plan including medication changes (LAI given on 05/10/19), patient's overall well-being and participation on the unit and pending level I screening. Mother requesting to be present during screening as she is concern patient will not divulge the truth, stating "his good demeanor is deceptive". She is concerned providers won't have the full picture to make an informed decision. She believes if patient does not meet criteria for Level II, he needs to be placed in a Level I facility. She continues to express concern about the patient's safety and believes he will not live very long if help isn't provided. Mother expressed appreciation for this provider contacting her and updating her on the care. She was informed discharge planning will occur after screening. Mother requesting CM contact her prior to screening and for a family meeting.

## 2019-05-13 NOTE — Progress Notes
PSYCHIATRIC PROGRESS NOTE     LOS: 8 days  Voluntary/Involuntary Status: Voluntary  Guardian? Parents      MEDICATIONS   Current Psychotropic Medications:   1. Abilify 30mg  qdaily  2. Depakote ER 1000mg  qhs   3. Folic acid 1 mg qday  4. Thiamine 100mg  qday     ASSESSMENT & DIAGNOSES   Jamie [name] is a 23 y.o. Caucasian male with a history of schizoaffective disorder who presented to [facility] ED with concerns of mania. This appears to be precipitated by medication noncompliance.  Factors that seem to have predisposed him to mental illness include genetics and substance use disorder. This current problem is maintained by substance use and medication noncompliance. However, protective factors include supportive family, intelligence and willingness to seek treatement. Proposed treatment has consisted of pharmacological management and individualized & group therapy.    DSM-5 DIAGNOSES:  ? Schizoaffective disorder, bipolar type, current episode manic, severe, with psychotic features    ? Tobacco use disorder, mild  ? Alcohol use disorder, severe      PLAN     ? Decreased depakote ER to 1000mg  qhs (7/13)  ? Received LAI Aristada 05/10/19 + 21 day overlap of PO Abilify 30mg  qday    ? Continue folic acid and thiamine supplements   ? Encourage participation in ward activities     AIMS to be performed this admission.  Metabolic labs completed on 7/6: TG 165, LDL 101.    Disposition: Maintain admission for safety and stabilization. Will go home upon discharge.    Seen and discussed with Dr. Shea Evans.  ______________________________________________________________     SUBJECTIVE     Per staff, no acute overnight events. Overall calm and cooperative with cares. Taking medications. Spending time in common areas, outside of room. Attending groups and interacting well with staff and peers. Slept 7.0 hours.     On encounter, patient endorsed feeling good. He is calm and cooperative. Has some anxiety about being screened for level II. Patient has plans to attend AA post-discharge. He denied SI/HI/AH. Chronic VH of shapes/colors.           REVIEW OF SYSTEMS   Review of Systems   Gastrointestinal: Negative for abdominal pain, diarrhea, nausea and vomiting.   Neurological: Negative for tremors, numbness and headaches.   Psychiatric/Behavioral: Negative for confusion, dysphoric mood and suicidal ideas. Positive VH     OBJECTIVE                    Vital Signs:  Current                Vital Signs: 24 Hour Range   BP: 140/72 (07/13 1000)  Temp: 36 ???C (96.8 ???F) (07/13 1000)  Pulse: 61 (07/13 1000)  Respirations: 20 PER MINUTE (07/13 1000)  SpO2: 100 % (07/13 1000) BP: (136-140)/(72-78)   Temp:  [36 ???C (96.8 ???F)-37.4 ???C (99.3 ???F)]   Pulse:  [61-98]   Respirations:  [16 PER MINUTE-20 PER MINUTE]   SpO2:  [96 %-100 %]    Intensity Pain Scale (Self Report): (not recorded)      Scheduled Medications:  ARIPiprazole (ABILIFY) tablet 30 mg, 30 mg, Oral, QDAY  divalproex (DEPAKOTE ER) ER tablet 1,500 mg, 1,500 mg, Oral, QHS  folic acid (FOLVITE) tablet 1 mg, 1 mg, Oral, QDAY  melatonin tablet 3 mg, 3 mg, Oral, QHS  nicotine (NICODERM CQ STEP 3) 7 mg/day patch 1 patch, 1 patch, Transdermal, QDAY    And  Verification of Patch Placement and Integrity - Nicotine 7 MG/24HR, , Transdermal, BID  thiamine (VITAMIN B-1) tablet 100 mg, 100 mg, Oral, QDAY      PRN Medications:  acetaminophen Q6H PRN 650 mg at 05/05/19 0411, docusate Q12H PRN, famotidine Q12H PRN, hydrOXYzine TID PRN 25 mg at 05/06/19 2041, nicotine polacrilex Q1H PRN 2 mg at 05/13/19 0909, OLANZapine Q6H PRN **OR** OLANZapine Q6H PRN, traZODone QHS PRN 50 mg at 05/12/19 2305      Mental Status Exam:  ??? General/Constitutional: 23 y.o. male, dressed in hospital attire, appears as stated age, well-groomed  ??? Behavior: Overall calm and cooperative   ??? Speech/Motor: normal rate, rhythm and tone  ??? Eye Contact: fair  ??? Mood: good ??? Affect: euthymic, mood congruent  ??? Thought Process: linear and logical  ??? Associations: no loose associations  ??? Thought Content: denies SI/HI  ??? Perception: denied AH. Endorsed chronic VH of shapes/dots/colors.   ??? Insight/Judgment: fair/fair    ??? Orientation: clear sensorium   ??? Recent and Remote Memory: intact   ??? Attention span and concentration: fair  ??? Language: fluent  ??? Fund of knowledge and vocabulary: above average       Focused Physical Exam:  ??? Musculoskeletal: moves all 4 extremities with full ROM  ??? Neurological: no tics or tremors noted    ______________________________________________________________  Era Skeen, MD   Psychiatry PGY-2  Pager: [phone]

## 2019-05-13 NOTE — Progress Notes
Calm and cooperative this evening. Interactive with staff and peers. Denies SI/HI/AH and pain at this time. Complaint with HS snack and medication. Will cont to monitor

## 2019-05-13 NOTE — Progress Notes
[facility]  Therapy Progress Note    NAME:Jamie [name] MRN: [id number] DOB:Dec 16, 1995 AGE: 23 y.o.  ADMISSION DATE: 05/05/2019 DAYS ADMITTED: LOS: 8 days    Date of Service:  05/13/19    Active Problems:    Schizoaffective disorder, bipolar type ([place])    Objective:  Therapist approached Patient in the Milieu as he was about to start playing cards; nevertheless, he agreed to a session and it took place in the Quiet Room.  Eye contact WNL.  Affect full; mood predominantly positive.  Patient is a rapid talker and at times it was difficult to interrupt his speech.  He was redirectable.  Patient also became acutely emotional as he talked about some current difficulties with his parents; he regained his composure appropriately.    Narrative:  Patient talked about the possibility of getting a long-term injectable.  He appeared optimistic that the medication would be helpful.  He also expressed awareness that his family wants him to receive a Level 2 Screen.  Patient expressed guilt about past actions involving alcohol and he recognized some lapses in judgement.  He expressed an interest in following up with AA or another alcohol treatment organization.  Patient also had some questions/concerns about intimate relationships with women.  These questions appeared generally appropriate for Patient's age and level of functioning.     Follow up:  Patient expressed appreciation for the session.  He acknowledged that ultimately he is the only one who can make changes in his life.  He also seemed to appreciate the inherent differences between short-term and long-term goals.    Treatment Team will continue to follow.

## 2019-05-13 NOTE — Progress Notes
TREATMENT TEAM NOTE  Name: Jamie [name]        MRN: [id number]          DOB: [birth year]          Age: 23 y.o.  Admission Date: 05/05/2019             LOS: 8 days        Attendees:   Psychiatry: Earle Gell. Damita Dunnings, DO  UR: Lina Sar, RN  Nursing:  Mancel Bale  CM: Neita Carp, LMSW  CM: Danielle Dess, LMSW  CM: Sherlyn Lick, LPC  Therapy: Wetzel Bjornstad, [facility], LCP  Pharmacy: Doreen Beam, PharmD  Psychology: Durel Salts, PsyD    Significant Points Discussed:   UR does not review  Family wants a Level 2 placement; CM has been in contact with family  Long acting injectable being considered    Treatment Plan Discussion:   Continue treatment

## 2019-05-14 NOTE — Progress Notes
Psychology [phone] - [phone])    Service(s): Individual Counseling and Psychotherapy    Presentation: Met with Mr. Whang to provide individual support. Session centered on recent admission and antecedent circumstances. Eziah reported that he had been using alcohol and substances prior to his admission starting around Father???s Day. Reports that he consumed ???up to 100 drinks, took wake salts, and was using THC and K2??? at the height of his abuse. Also acknowledged that he had not been taking his prescribed medications. He acknowledged that these behaviors were ???risky??? and problematic. He attributed engaging in these activities in connection to family stress, increased freedom, lack of work/structure and interactions with a friend who was a negative influence on him. Described this individual as someone who would provide easy access to alcohol and drugs. Reports motivation at this point to abstain from alcohol and drug use, denies craving, and identifies specific contributions/triggers to his behavior patterns. Shared understanding that his parents would be concerned for him and his well-being while he was making these decisions and acknowledged his diagnosis of ???Schizoaffective Disorder.??? Expressed that ???it would not be the end of the world??? if he was placed in a level 1 or 2 facility but felt that he does not need the level of care associated with these facilities. He also reported some frustration that this would potentially limit his access to social supports, transportation, work, and Archivist. He shared goals of enrolling in school to study law or computer science in the future. Expressed that placement may limit these opportunities. Became tearful when discussing this. Also expressed interest in having guardianship and conservatorship revoked citing his previous level of stability with his diagnosis and functioning. No other issues noted. MSE: Casually dressed and groomed, A+Ox3, mood grossly euthymic, fair range of affect; tearful at times, eye contact was WNL, speech WNL, TP linear and goal-directed, TC was negative for SI, HI, AH; reports chronic VH, insight was good, judgment was good, actively engaged in session    Psychotherapy: Individual counseling was provided. Focus of session centered on recovery from recent substance abuse behavior, optimizing hospitalization, and managing changes in family relationships. Counseling reinforced patient???s recognition and acknowledgment of recent substance abuse and their negative consequences. Supported patient in plan to avoid social interactions with known enablers of alcohol and drug abuse and engage in greater structure to avoid motivations to use alcohol and drugs. Counseling also supported patient in perspective-taking regarding the concerns his parents have demonstrated regarding his behavior. Communication strategies and changes in behavior to help repair this relationship was discussed. Education was provided regarding level 1 and 2 to the extent possible, however, he was encouraged to work with team members to have his questions answered on a day to day basis. Reaffirmed plan to engage in outpatient psychotherapy with this provider and increase medication compliance. Response to session was positive.    Plan: Will continue to follow    Alleen Borne, Ph.D., [phone]

## 2019-05-14 NOTE — Progress Notes
Chart check complete

## 2019-05-14 NOTE — Progress Notes
Service(s): Inpatient Group Psychotherapy     Patient data/presentation/complaint: Mr. Jamie [name] was seen in group therapy (1145 - 1230). He described his mood as pretty good today. Noted to be actively engaged in session. Worked to apply material and engage in discussion throughout the meeting. No other issues noted.      MSE: casually dressed and groomed, A+Ox3, mood, pretty good full range of affect, speech WNL, eye contact was WNL, TP linear and goal-directed, TC no overt (or reported) evidence of SI/HI/AH/VH, I fair-good, J fair-good, active participation in therapy     Psychosocial Intervention(s): Supportive therapy with a focus on stress and resiliency was provided. Response was positive     Plan: Will continue to follow on an inpatient basis.    Provider:     Lendon Ka, Ph.D., [phone]

## 2019-05-14 NOTE — Care Plan
Problem: Discharge Planning  Goal: Participation in plan of care  Outcome: Goal Ongoing  Goal: Knowledge regarding plan of care  Outcome: Goal Ongoing  Goal: Prepared for discharge  Outcome: Goal Ongoing     Problem: Health Maintenance - Impaired  Goal: Able to perfom ADL's  Outcome: Goal Ongoing  Goal: Adequate nutritional intake  Outcome: Goal Ongoing  Goal: Establish therapeutic relationships  Outcome: Goal Ongoing  Goal: Knowledge of health maintenance  Outcome: Goal Ongoing     Problem: Mood - Altered  Goal: Stabilize mood  Outcome: Goal Ongoing  Goal: Knowledge of Altered Mood  Outcome: Goal Ongoing  Pt denies pain, attended groups. Pt stated he had a hard day due to family issues. Pt has been calm and cooperative. Pt compliant with HS medications. Pt out in the milieu, watching TV, and interacting with peers. Pt states no SI. no AH/VH. Pt requested nicorette gum for cravings. PRN  med given.      Problem: Self-esteem - Low  Goal: Demonstration of positive self-esteem  Outcome: Goal Ongoing  Goal: Knowledge of low self-esteem  Outcome: Goal Ongoing     Problem: Thought Process - Altered  Goal: Demonstration of organized thought processes  Outcome: Goal Ongoing  Goal: Knowledge of altered thought process  Outcome: Goal Ongoing     Problem: Violence, self/other-directed, Risk of  Goal: Absence of violence  Outcome: Goal Ongoing  Goal: Knowledge of risk for violence, self/other-directed  Outcome: Goal Ongoing     Problem: Harm to Self/Others, Risk of (Non-Psychiatric Patient)  Goal: Absence of Harm to Self/Others  Outcome: Goal Ongoing     Problem: Tobacco Use  Goal: Knowledge of tobacco-use cessation methods  Outcome: Goal Ongoing

## 2019-05-14 NOTE — Progress Notes
Chart check completed.

## 2019-05-14 NOTE — Progress Notes
PSYCHIATRIC PROGRESS NOTE     LOS: 9 days  Voluntary/Involuntary Status: Voluntary  Guardian? Parents      MEDICATIONS   Current Psychotropic Medications:   1. Aripiprazole 30mg  PO daily (Aristada received 882mg  on 7/10)  2. Depakote ER 1000mg  PO qHS  3. Folic acid 1mg  PO daily  4. Thiamine 100mg  PO daily     ASSESSMENT & DIAGNOSES   Clemmie Marxen is a 23 y.o. Caucasian male with a history of schizoaffective disorder who presented to [facility] ED with concerns of mania. This appears to be precipitated by medication noncompliance.  Factors that seem to have predisposed him to mental illness include genetics and substance use disorder. This current problem is maintained by substance use and medication noncompliance. However, protective factors include supportive family, intelligence and willingness to seek treatement. Proposed treatment has consisted of pharmacological management and individualized & group therapy.    DSM-5 DIAGNOSES:  ? Schizoaffective disorder, bipolar type, current episode manic, severe, with psychotic features    ? Tobacco use disorder, mild  ? Alcohol use disorder, severe     PLAN     ? No medication changes schedule for 05/14/19.  ? Received LAI Aristada 05/10/19 + 21 day overlap of aripiprazole 30mg  PO daily.  ? Continue folic acid and thiamine supplements.  ? Encourage participation in ward activities.    AIMS to be performed this admission.  Metabolic labs completed on 7/6: TG 165, LDL 101.    Disposition: Maintain admission for safety and stabilization. Will go home upon discharge.  ______________________________________________________________     Anastasio Auerbach Courter was seen alone in the consultation room.  His mood continues to be great.  He remains calm and cooperative with staff and the unit.  He is participating in groups well.  He is sleeping well.  He is engaged with others on the unit.  He is compliant with medications.  He still is a bit extra talkative, but not pressured and demonstrates no flight of ideas.  He is still expressing frustration with his parents at wanting him to live in a group home, but says I should probably call her, huh?  Then he reasons out he needs to be civil to his mother on the phone and that he should attempt to gain her trust back from drinking so much alcohol.  He again reiterates he has plans to go to alcohol classes when he leaves but she probably doesn't believe me.  Encouraged him to gain his parent's trust again by doing what they say until he can show he is responsible again.  He does mention that once a long time ago he thought about burning down the house with her [mother] in it, but that this was fleeting and just something I thought about and that he would not act on this.  He expressed frustration that when he told his mother about these thoughts that she seemed to overreact to this in his mind.          REVIEW OF SYSTEMS   Review of Systems   Gastrointestinal: positive for diarrhea (loose stools), but does not want medication for this as he says it is related to Abilify, but he does not wish to change this; negative constipation  Cardiovascular: negative for chest pain  Respiratory: negative for shortness of breath  Psychiatric/Behavioral: I never hear voices; no suicidal or homicidal ideation     OBJECTIVE  Vital Signs:  Current                Vital Signs: 24 Hour Range   BP: 116/69 (07/14 0700)  Temp: 36.8 ???C (98.2 ???F) (07/14 0700)  Pulse: 62 (07/14 0700)  Respirations: 16 PER MINUTE (07/14 0700)  SpO2: 100 % (07/14 0700) BP: (116-132)/(69-76)   Temp:  [36.8 ???C (98.2 ???F)-37 ???C (98.6 ???F)]   Pulse:  [62-98]   Respirations:  [16 PER MINUTE-18 PER MINUTE]   SpO2:  [98 %-100 %]    Intensity Pain Scale (Self Report): (not recorded)      Scheduled Medications:  ARIPiprazole (ABILIFY) tablet 30 mg, 30 mg, Oral, QDAY divalproex (DEPAKOTE ER) ER tablet 1,000 mg, 1,000 mg, Oral, QHS  folic acid (FOLVITE) tablet 1 mg, 1 mg, Oral, QDAY  melatonin tablet 3 mg, 3 mg, Oral, QHS  nicotine (NICODERM CQ STEP 3) 7 mg/day patch 1 patch, 1 patch, Transdermal, QDAY    And  Verification of Patch Placement and Integrity - Nicotine 7 MG/24HR, , Transdermal, BID  thiamine (VITAMIN B-1) tablet 100 mg, 100 mg, Oral, QDAY      PRN Medications:  acetaminophen Q6H PRN 650 mg at 05/05/19 0411, docusate Q12H PRN, famotidine Q12H PRN, hydrOXYzine TID PRN 25 mg at 05/06/19 2041, nicotine polacrilex Q1H PRN 2 mg at 05/14/19 1654, OLANZapine Q6H PRN **OR** OLANZapine Q6H PRN, traZODone QHS PRN 50 mg at 05/13/19 2151      Mental Status Exam:  ??? General/Constitutional: 23 y.o. male, dressed in hospital attire, appears as stated age, well-groomed  ??? Behavior: Overall calm and cooperative   ??? Speech/Motor: Normal rate, rhythm, volume and tone.  Hyperverbal.  ??? Eye Contact: Good  ??? Mood: Great!  ??? Affect: Euthymic, full range; mood congruent  ??? Thought Process: Linear and logical, goal directed  ??? Associations: Intact  ??? Thought Content: No suicidal or homicidal ideations  ??? Perception: No AH/VH.  ??? Insight/Judgment: Fair/Fair    ??? Orientation: Clear sensorium   ??? Recent and Remote Memory: Intact  ??? Attention span and concentration: Fair  ??? Language: Fluent  ??? Fund of knowledge and vocabulary: Above average       Focused Physical Exam:  ??? Musculoskeletal: moves all 4 extremities with full ROM  ??? Neurological: no tics or tremors noted; smooth gait without ataxia   ______________________________________________________________  Almira Bar, DO

## 2019-05-14 NOTE — Progress Notes
TREATMENT TEAM NOTE  Name: Arek Spadafore        MRN: [id number]          DOB: May 31, 1996          Age: 23 y.o.  Admission Date: 05/05/2019             LOS: 9 days    Attendees:   Psychiatry: Earle Gell. Damita Dunnings, DO  UR: Lina Sar, RN  Nurse: Marylou Flesher, RN  CM: Danielle Dess, LMSW  CM: Sherlyn Lick, [facility]  Therapy: Wetzel Bjornstad, LMLP, LCP  Therapy: Donovan Kail, Deeann Cree, [facility]  Pharmacy: Doreen Beam, PharmD  Psychology: Linton Ham. Michiel Cowboy, PhD  Psychology: Durel Salts, PsyD  Psychology Intern:  Blanchie Serve, MS    Significant Points Discussed: engaged on the milieu, calm and cooperative, denies SI/AVH; reports family stressors yesterday caused anxiety; patient aware of placement requests from family and reports he does not want this level of placement; patient able to explore ETOH use and increasing motivation for lowering risk during group therapy    Treatment Plan Discussion: continue to engage in treatment

## 2019-05-14 NOTE — Progress Notes
Pt stated that cravings have been decreased.

## 2019-05-14 NOTE — Care Plan
Patient denies SI/HI/AH and pain, but endorses VH "as always".  Patient describes mood as "okay; I'm still repressing thoughts about my family and what they are trying to do to me".  Patient declined to elaborate further.  Patient is still slightly manic but med compliant and appropriate with staff.  Patient meeting with treatment team daily and discharge plan is ongoing.      Problem: Discharge Planning  Goal: Participation in plan of care  Outcome: Goal Ongoing  Goal: Knowledge regarding plan of care  Outcome: Goal Ongoing  Goal: Prepared for discharge  Outcome: Goal Ongoing     Problem: Health Maintenance - Impaired  Goal: Able to perfom ADL's  Outcome: Goal Ongoing  Goal: Adequate nutritional intake  Outcome: Goal Ongoing  Goal: Establish therapeutic relationships  Outcome: Goal Ongoing  Goal: Knowledge of health maintenance  Outcome: Goal Ongoing     Problem: Mood - Altered  Goal: Stabilize mood  Outcome: Goal Ongoing  Goal: Knowledge of Altered Mood  Outcome: Goal Ongoing     Problem: Thought Process - Altered  Goal: Demonstration of organized thought processes  Outcome: Goal Ongoing  Goal: Knowledge of altered thought process  Outcome: Goal Ongoing     Problem: Violence, self/other-directed, Risk of  Goal: Absence of violence  Outcome: Goal Ongoing  Goal: Knowledge of risk for violence, self/other-directed  Outcome: Goal Ongoing     Problem: Harm to Self/Others, Risk of (Non-Psychiatric Patient)  Goal: Absence of Harm to Self/Others  Outcome: Goal Ongoing     Problem: Tobacco Use  Goal: Knowledge of tobacco-use cessation methods  Outcome: Goal Ongoing

## 2019-05-15 MED ORDER — MELATONIN 5 MG PO TAB
5 mg | Freq: Every evening | ORAL | 0 refills | Status: DC
Start: 2019-05-15 — End: 2019-05-24
  Administered 2019-05-16 – 2019-05-23 (×8): 5 mg via ORAL

## 2019-05-15 NOTE — Progress Notes
Chart check completed.

## 2019-05-15 NOTE — Progress Notes
Psychology (1000 - 1105)    Service(s): Individual Counseling and Psychotherapy    Presentation: Met with Mr. Sundeen to provide individual support. He reported that he is feeling good, but is still not happy with the decision from his family to put him into a group home. At first he was frustrated with his family, feeling like they do not care about him. Prompted by the anger, he was ready to cut ties with the family. As we continued to process the anger, he started expressing the sadness he was feeling towards this event. He brought up how his relationship with his family had been great, but he felt like it changed overtime and said I was the reason it became like this. He reported motivation towards repairing the relationship, identifying trust to be the main issue now as he feels like his parents do not trust him to be able to take care of himself. He noted that he frequently conceal his state of wellness to his parents in fear of ending up in the hospital, which may have contributed to the difficulty in trust and gaining more understanding.      MSE: Casually dressed and groomed, A+Ox3, mood good but tend to mask the sadness inside, fair range of affect, eye contact was WNL, speech was fast and tends to interrupt mid-sentence, TP linear and goal-directed, reported delusions, VH; negative for SI, HI, AH; insight was fair, judgment was fair, actively engaged in session    Psychotherapy: Individual counseling was provided. Focus of session centered on processing the anger and sadness from the realization of possibly going into a group home. Initially expressed anger, but later realized that anger was his way of protecting him from the deep sadness and vulnerability he was feeling because it was easier to do so. Discussed what this underlying sadness meant to him. He noted that his parents care about him very much, and he loves them as well. He is sad that the relationship had changed because of how he reacted to the mental illness. When asked about what can be changed, he noted that the parents' trust for him had gone down because he does not let them know when things are not going well. He said that he likes to try to cope with it himself, and would let the parents know only when it is out of control. We talked about how that may look like from the parents' perspectives, and he agreed that they will understand and trust him more if he were to always let them know early on. He is still hesitant to do that as he fears that he will be hospitalized more. Though not decided yet, he noted steps he can take to move forward including trying to listen more, and communicate some of his feelings and his hesitation towards communicating more early on.    Plan: Will continue to follow    Jonnie Finner, MS

## 2019-05-15 NOTE — Progress Notes
PSYCHIATRIC PROGRESS NOTE     LOS: 10 days  Voluntary/Involuntary Status: Voluntary  Guardian? Parents        MEDICATIONS   Current Psychotropic Medications:   1. Aripiprazole 30mg  PO daily (Aristada received 882mg  on 7/10)  2. Depakote ER 1000mg  PO qHS  3. Folic acid 1mg  PO daily  4. Thiamine 100mg  PO daily       ASSESSMENT & DIAGNOSES   Jamie [name] is a 23 y.o. Caucasian male with a history of schizoaffective disorder who presented to [facility] ED with concerns of mania. This appears to be precipitated by medication noncompliance.  Factors that seem to have predisposed him to mental illness include genetics and substance use disorder. This current problem is maintained by substance use and medication noncompliance. However, protective factors include supportive family, intelligence and willingness to seek treatement. Proposed treatment has consisted of pharmacological management and individualized & group therapy.  ???  DSM-5 DIAGNOSES:  ??? Schizoaffective disorder, bipolar type, current episode manic, severe, with psychotic features           ??? Tobacco use disorder, mild  ??? Alcohol use disorder, severe       PLAN     ??? Melatonin increased to 5mg  qhs   ??? Continue Depakote ER 1000mg  qhs   ??? Received LAI Aristada 05/10/19 + 21 day overlap of aripiprazole 30mg  PO daily.  ??? Continue folic acid and thiamine supplements.  ??? Encourage participation in ward activities.    AIMS to be performed this admission     Metabolic labs completed on:  Lipid Panel:   LDL   Date Value Ref Range Status   05/06/2019 101 (H) <100 mg/dL Final     HDL   Date Value Ref Range Status   05/06/2019 46 >40 MG/DL Final     VLDL   Date Value Ref Range Status   05/06/2019 33 MG/DL Final     Cholesterol   Date Value Ref Range Status   05/06/2019 164 <200 MG/DL Final     Triglycerides   Date Value Ref Range Status   05/06/2019 165 (H) <150 MG/DL Final       Blood Glucose:  Hemoglobin A1C   Date Value Ref Range Status 05/06/2019 5.1 4.0 - 6.0 % Final     Comment:     The ADA recommends that most patients with type 1 and type 2 diabetes maintain   an A1c level <7%.         Dispositon: Maintain admission for safety and stabilization.  Will go to home upon discharge.    Seen and discussed with Dr. Shea Evans.    ______________________________________________________________     SUBJECTIVE     Per staff, patient is calm and cooperative. Taking medications. Had visit from family yesterday. Spending time in common areas. Slept 7.25 hours.   Group attendance? Participating and attending.     Jamie [name] was seen in the day room. He was visited by his family yesterday and the meeting went well. Patient mentioned his mother is requesting a family meeting prior to discharge. Patient is still waiting for Level I screening. Otherwise, he has no complaints today. Denied SI/HI/AH. Endorsed chronic VH of shapes/dots/colors.        REVIEW OF SYSTEMS   Review of Systems   Respiratory: Negative for shortness of breath.    Cardiovascular: Negative for chest pain.   Gastrointestinal: Negative for abdominal pain, constipation, diarrhea and nausea.   Neurological: Negative for headaches.  Psychiatric/Behavioral: Positive for hallucinations. Negative for agitation, dysphoric mood, sleep disturbance and suicidal ideas. The patient is not nervous/anxious.         OBJECTIVE                    Vital Signs:  Current                Vital Signs: 24 Hour Range   BP: 143/63 (07/15 0900)  Temp: 36.9 ???C (98.4 ???F) (07/15 0900)  Pulse: 98 (07/15 0900)  Respirations: 20 PER MINUTE (07/15 0900)  SpO2: 98 % (07/15 0900) BP: (143-146)/(63-84)   Temp:  [36.9 ???C (98.4 ???F)-37 ???C (98.6 ???F)]   Pulse:  [77-98]   Respirations:  [18 PER MINUTE-20 PER MINUTE]   SpO2:  [98 %-99 %]    Intensity Pain Scale (Self Report): (not recorded)      Sleep:  Hours of Sleep: 7.25   Quality of Sleep: Resting Quietly    Scheduled Medications: ARIPiprazole (ABILIFY) tablet 30 mg, 30 mg, Oral, QDAY  divalproex (DEPAKOTE ER) ER tablet 1,000 mg, 1,000 mg, Oral, QHS  folic acid (FOLVITE) tablet 1 mg, 1 mg, Oral, QDAY  melatonin tablet 3 mg, 3 mg, Oral, QHS  nicotine (NICODERM CQ STEP 3) 7 mg/day patch 1 patch, 1 patch, Transdermal, QDAY    And  Verification of Patch Placement and Integrity - Nicotine 7 MG/24HR, , Transdermal, BID  thiamine (VITAMIN B-1) tablet 100 mg, 100 mg, Oral, QDAY        PRN Medications:  acetaminophen Q6H PRN 650 mg at 05/05/19 0411, docusate Q12H PRN, famotidine Q12H PRN, hydrOXYzine TID PRN 25 mg at 05/06/19 2041, nicotine polacrilex Q1H PRN 2 mg at 05/14/19 2101, OLANZapine Q6H PRN **OR** OLANZapine Q6H PRN, traZODone QHS PRN 50 mg at 05/14/19 2144    Mental Status Exam:  ??? General/Constitutional: 23 y.o. male, dressed in hospital attire, appears as stated age, well-groomed  ??? Behavior: Overall calm and cooperative   ??? Speech/Motor: Normal rate, rhythm, volume and tone. Hyperverbal.  ??? Eye Contact: Good  ??? Mood: very good  ??? Affect: Euthymic, full range; mood congruent  ??? Thought Process: Linear and logical, goal directed  ??? Associations: Intact  ??? Thought Content: No suicidal or homicidal ideations  ??? Perception: No AH/VH.  ??? Insight/Judgment: Fair/Fair  ???  ??? Orientation: Clear sensorium   ??? Recent and Remote Memory: Intact  ??? Attention span and concentration: Fair  ??? Language: English, fluent  ??? Fund of knowledge and vocabulary: Above average     Focused Physical Exam:  ??? Musculoskeletal: moves all extremities spontaneously   ??? Neurological: no tics or tremors    ______________________________________________________________  Era Skeen, MD

## 2019-05-15 NOTE — Progress Notes
Service(s): Inpatient Group Psychotherapy     Patient data/presentation/complaint: Jamie [name] was seen in group therapy (1145 - 1230). He described his mood as pretty good today. Noted to be actively engaged in the session. Worked to apply material with active disclosure and effectively interact with peers. Responded well to material and discussion during session. No other issues noted.      MSE: casually dressed and groomed, A+Ox3, mood, pretty good fair range of affect, speech WNL, eye contact was WNL, TP linear and goal-directed, TC no overt (or reported) evidence of SI/HI/AH/VH, I fair-good, J fair-good, active participation in therapy     Psychosocial Intervention(s): Supportive therapy with a focus on positive psychology and behavior change was provided. Response was positive     Plan: Will continue to follow on an inpatient basis.    Provider:     Lendon Ka, Ph.D., [phone]

## 2019-05-15 NOTE — Progress Notes
Case Management Progress Note    NAME:Jamie [name] MRN: [id number] DOB:1996-06-01 AGE: 23 y.o.  ADMISSION DATE: 05/05/2019 DAYS ADMITTED: LOS: 10 days     Date of Service: 05/15/2019  Service Start Time:  1:00pm    CM spoke with screener Melissa Nyquest from second floor. Melissa called patients mother Jamie [name] to gain her input for the screen. Melissa will meet with patient tomorrow morning to complete the level on screen with him.     CM met with patient on the unit this afternoon to inform him of the screen that will be taking place in the morning. Patient endorsed regret for his relationship with his family deteriorating. Patient stated that he knows that his mother wants the best for him.       Sherlyn Lick, [facility]

## 2019-05-15 NOTE — Progress Notes
TREATMENT TEAM NOTE  Name: Jamie [name]        MRN: [id number]          DOB: [birth year]          Age: 23 y.o.  Admission Date: 05/05/2019             LOS: 10 days      Attendees: Psychiatry: Carmie End, DO  UR: Larose Hires, RN  Nursing: Lynne Logan, RN  CM: Danielle Dess, LMSW  CM: Sherlyn Lick, [facility]  Therapy: Wetzel Bjornstad, LMLP, LCP  Therapy: Donovan Kail, Deeann Cree, [facility]  Pharmacy: Doreen Beam, PharmD  Psychology: Linton Ham. Michiel Cowboy, PhD  Psychology Intern:  Redgie Grayer, MA   Psychology Intern:  Blanchie Serve, [place]  Psychology Intern:  Anette Riedel, MA    Significant Points Discussed: positive family visit yesterday; calm cooperative; mother requesting provider meeting and CM will initiate level 1 screen; received long acting injectable; patient engaged in individual and group therapy    Treatment Plan Discussion: continue to engage in treatment

## 2019-05-15 NOTE — Care Plan
Out on unit, attending groups.  Stays in room during grps so he can have mask off for a bit.  Denies SI/HI or AH.  States has VH all the time.  Denies any pain.  Slept okay and appetite is good.    Problem: Discharge Planning  Goal: Participation in plan of care  Outcome: Goal Ongoing  Goal: Knowledge regarding plan of care  Outcome: Goal Ongoing  Goal: Prepared for discharge  Outcome: Goal Ongoing     Problem: Health Maintenance - Impaired  Goal: Able to perfom ADL's  Outcome: Goal Ongoing  Goal: Adequate nutritional intake  Outcome: Goal Ongoing  Goal: Establish therapeutic relationships  Outcome: Goal Ongoing  Goal: Knowledge of health maintenance  Outcome: Goal Ongoing     Problem: Mood - Altered  Goal: Stabilize mood  Outcome: Goal Ongoing  Goal: Knowledge of Altered Mood  Outcome: Goal Ongoing

## 2019-05-15 NOTE — Care Plan
Pt meeting with treatment team daily.  Discharge planning ongoing.   Mood and behavior have been stable. Pt has been calm and cooperative. Pt out in the milieu, watched tv, and interacting with peers.  Pt complaint with HS medication.  Pt reports good visit with family this evening.  Pt inquired if HS melatonin could be increased from 3mg  to 5mg .  Pt instructed to discuss medication with treatment team in the morning.          Problem: Discharge Planning  Goal: Participation in plan of care  Outcome: Goal Ongoing     Problem: Discharge Planning  Goal: Knowledge regarding plan of care  Outcome: Goal Ongoing     Problem: Discharge Planning  Goal: Prepared for discharge  Outcome: Goal Ongoing     Problem: Health Maintenance - Impaired  Goal: Establish therapeutic relationships  Outcome: Goal Ongoing     Problem: Mood - Altered  Goal: Stabilize mood  Outcome: Goal Ongoing     Problem: Violence, self/other-directed, Risk of  Goal: Knowledge of risk for violence, self/other-directed  Outcome: Goal Ongoing

## 2019-05-16 MED ORDER — CALCIUM CARBONATE 200 MG CALCIUM (500 MG) PO CHEW
500 mg | Freq: Two times a day (BID) | ORAL | 0 refills | Status: DC | PRN
Start: 2019-05-16 — End: 2019-05-24

## 2019-05-16 NOTE — Care Plan
Out in milieu at times.  Attended some groups.  Denies SI/HI or AH.  Admits to AV all the time.  Slept well.  Appetite good.  Little anxious about level 1 placement.  Problem: Discharge Planning  Goal: Participation in plan of care  Outcome: Goal Ongoing  Goal: Knowledge regarding plan of care  Outcome: Goal Ongoing  Goal: Prepared for discharge  Outcome: Goal Ongoing     Problem: Health Maintenance - Impaired  Goal: Able to perfom ADL's  Outcome: Goal Ongoing  Goal: Adequate nutritional intake  Outcome: Goal Ongoing  Goal: Establish therapeutic relationships  Outcome: Goal Ongoing  Goal: Knowledge of health maintenance  Outcome: Goal Ongoing     Problem: Mood - Altered  Goal: Stabilize mood  Outcome: Goal Ongoing  Goal: Knowledge of Altered Mood  Outcome: Goal Ongoing     Problem: Thought Process - Altered  Goal: Demonstration of organized thought processes  Outcome: Goal Ongoing  Goal: Knowledge of altered thought process  Outcome: Goal Ongoing     Problem: Violence, self/other-directed, Risk of  Goal: Absence of violence  Outcome: Goal Ongoing  Goal: Knowledge of risk for violence, self/other-directed  Outcome: Goal Ongoing     Problem: Harm to Self/Others, Risk of (Non-Psychiatric Patient)  Goal: Absence of Harm to Self/Others  Outcome: Goal Ongoing     Problem: Tobacco Use  Goal: Knowledge of tobacco-use cessation methods  Outcome: Goal Ongoing

## 2019-05-16 NOTE — Progress Notes
TREATMENT TEAM NOTE  Name: Lorenza Winkleman        MRN: [id number]          DOB: 08/07/1996          Age: 23 y.o.  Admission Date: 05/05/2019             LOS: 11 days        Attendees:   Psychiatry: Earle Gell. Damita Dunnings, DO  UR: Caroline More, RN  Nursing:  Lynne Logan   CM: Neita Carp, LMSW  CM: Danielle Dess, LMSW  CM: Sherlyn Lick, LPC  Therapy: Wetzel Bjornstad, LMLP, LCP  Therapy: Jamison Oka, [facility]  Pharmacy: Doreen Beam, PharmD  Psychology: Linton Ham. Michiel Cowboy, PhD  Psychology Intern:  Anette Riedel, MA   Psychology Intern:  Redgie Grayer, MA     Significant Points Discussed:   Received PRN Trazodone  Endorsed [facility]  Friendly and cooperative  Level 1 Screen will be completed today    Treatment Plan Discussion:   Continue Treatment

## 2019-05-16 NOTE — Progress Notes
Case Management Progress Note    NAME:Eduard Robbert Langlinais MRN: [id number] DOB:1996-05-16 AGE: 23 y.o.  ADMISSION DATE: 05/05/2019 DAYS ADMITTED: LOS: 11 days     Date of Service: 05/16/2019    Case Manager completed Level I assessment with patient. Contacted guardian via phone to get e-mail documents to sign and return.  This Probation officer will send KDADS completed assessment once sign documents are returned.

## 2019-05-16 NOTE — Progress Notes
PSYCHIATRIC PROGRESS NOTE     LOS: 11 days  Voluntary/Involuntary Status: Voluntary  Guardian? Parents        MEDICATIONS   Current Psychotropic Medications:   1. Aripiprazole 30mg  PO daily (Aristada received 882mg  on 7/10)  2. Depakote ER 1000mg  PO qHS  3. Folic acid 1mg  PO daily  4. Thiamine 100mg  PO daily  5. Melatonin 5mg  qhs        ASSESSMENT & DIAGNOSES   Jamie [name] is a 23 y.o. Caucasian male with a history of schizoaffective disorder who presented to [facility] ED with concerns of mania. This appears to be precipitated by medication noncompliance.  Factors that seem to have predisposed him to mental illness include genetics and substance use disorder. This current problem is maintained by substance use and medication noncompliance. However, protective factors include supportive family, intelligence and willingness to seek treatement. Proposed treatment has consisted of pharmacological management and individualized & group therapy.  ???  DSM-5 DIAGNOSES:  ??? Schizoaffective disorder, bipolar type, current episode manic, severe, with psychotic features           ??? Tobacco use disorder, mild  ??? Alcohol use disorder, severe       PLAN     ??? No changes made on 05/16/19   ??? Continue Depakote ER 1000mg  qhs   ??? Received LAI Aristada 05/10/19 + 21 day overlap of aripiprazole 30mg  PO daily.  ??? Continue folic acid and thiamine supplements.  ??? Encourage participation in ward activities.    AIMS to be performed this admission     Metabolic labs completed on:  Lipid Panel:   LDL   Date Value Ref Range Status   05/06/2019 101 (H) <100 mg/dL Final     HDL   Date Value Ref Range Status   05/06/2019 46 >40 MG/DL Final     VLDL   Date Value Ref Range Status   05/06/2019 33 MG/DL Final     Cholesterol   Date Value Ref Range Status   05/06/2019 164 <200 MG/DL Final     Triglycerides   Date Value Ref Range Status   05/06/2019 165 (H) <150 MG/DL Final       Blood Glucose:  Hemoglobin A1C   Date Value Ref Range Status 05/06/2019 5.1 4.0 - 6.0 % Final     Comment:     The ADA recommends that most patients with type 1 and type 2 diabetes maintain   an A1c level <7%.         Dispositon: Maintain admission for safety and stabilization.  Will go to home upon discharge.    Seen and discussed with Dr. Shea Evans.    ______________________________________________________________     SUBJECTIVE     Patient was cautioned on an inappropriate conversation he had with a family member of his peer. Per RN, he told this family member that his peer is on Haldol and that did not work for him in the past. Otherwise, he is cooperative and is taking medications. Spending time in common areas. Slept 7.5 hours.   Group attendance? Participating and attending.     Hooper Griesel Trumbo feels anxious about his level I screening although he believes he won't meet criteria. He is working on Chiropractor the relationship he has with his family. He denied SI/HI/AH. Chronic VH of colored shapes. Objectively, he appears less disorganized and has been behaviorally compliant with cares. Patient has plans to follow up with outpatient provider for an eye exam.  REVIEW OF SYSTEMS   Review of Systems   Respiratory: Negative for shortness of breath.    Cardiovascular: Negative for chest pain.   Gastrointestinal: Negative for abdominal pain, constipation, diarrhea and nausea.   Neurological: Negative for headaches.   Psychiatric/Behavioral: Positive for hallucinations. Negative for dysphoric mood, sleep disturbance and suicidal ideas.        OBJECTIVE                    Vital Signs:  Current                Vital Signs: 24 Hour Range   BP: 136/85 (07/16 0700)  Temp: 37.3 ???C (99.1 ???F) (07/16 0700)  Pulse: 66 (07/16 0700)  Respirations: 18 PER MINUTE (07/16 0700)  SpO2: 100 % (07/16 0700) BP: (125-136)/(75-85)   Temp:  [36.7 ???C (98.1 ???F)-37.3 ???C (99.1 ???F)]   Pulse:  [66-86]   Respirations:  [18 PER MINUTE]   SpO2:  [96 %-100 %] Intensity Pain Scale (Self Report): (not recorded)      Sleep:  Hours of Sleep: 7.25   Quality of Sleep: Resting Quietly    Scheduled Medications:  ARIPiprazole (ABILIFY) tablet 30 mg, 30 mg, Oral, QDAY  divalproex (DEPAKOTE ER) ER tablet 1,000 mg, 1,000 mg, Oral, QHS  folic acid (FOLVITE) tablet 1 mg, 1 mg, Oral, QDAY  melatonin tablet 5 mg, 5 mg, Oral, QHS  nicotine (NICODERM CQ STEP 3) 7 mg/day patch 1 patch, 1 patch, Transdermal, QDAY    And  Verification of Patch Placement and Integrity - Nicotine 7 MG/24HR, , Transdermal, BID  thiamine (VITAMIN B-1) tablet 100 mg, 100 mg, Oral, QDAY        PRN Medications:  acetaminophen Q6H PRN 650 mg at 05/05/19 0411, docusate Q12H PRN, famotidine Q12H PRN, hydrOXYzine TID PRN 25 mg at 05/06/19 2041, nicotine polacrilex Q1H PRN 2 mg at 05/16/19 1053, OLANZapine Q6H PRN **OR** OLANZapine Q6H PRN, traZODone QHS PRN 50 mg at 05/15/19 2123    Mental Status Exam:  ??? General/Constitutional: 23 y.o. male, dressed in hospital attire, appears as stated age, well-groomed  ??? Behavior: Overall calm and cooperative   ??? Speech/Motor: Normal rate, rhythm, volume and tone.   ??? Eye Contact: Good  ??? Mood: alright  ??? Affect: Euthymic, full range; mood congruent  ??? Thought Process: Linear and logical, goal directed  ??? Associations: Intact  ??? Thought Content: No suicidal or homicidal ideations  ??? Perception: No AH/VH.  ??? Insight/Judgment: Fair/Fair  ???  ??? Orientation: Clear sensorium   ??? Recent and Remote Memory: Intact  ??? Attention span and concentration: Fair  ??? Language: English, fluent  ??? Fund of knowledge and vocabulary: Above average     Focused Physical Exam:  ??? Musculoskeletal: moves all extremities spontaneously   ??? Neurological: no tics or tremors    ______________________________________________________________  Era Skeen, MD

## 2019-05-16 NOTE — Progress Notes
Case Management Progress Note    NAME:Agamjot Jessup Ogas MRN: [id number] DOB:Aug 14, 1996 AGE: 23 y.o.  ADMISSION DATE: 05/05/2019 DAYS ADMITTED: LOS: 11 days     Date of Service: 05/16/2019      This writer contacted patient's guardian, Cleven Jansma at [phone]. Discussed and partical completion of  Level I assessment. This Probation officer will complete Level I and will contact placement options in [place].

## 2019-05-16 NOTE — Progress Notes
Psychology (1020 - 1045)    Service(s): Individual Counseling and Psychotherapy    Presentation: Met with Jamie [name] to provide individual support. He reported that he is feeling well, and that he is more "at ease" with the placement situation. He reported that he took the assessment for placement, and that he believes he will not be qualified for Level 2 facility, and not sure if he will even be qualified for level 1. He reported that he had new realization of how much mixed emotion he holds for his parents, and he does not feel ready to start repairing the relationship with his parents. He acknowledged that this might be a long-term goal to keep in mind. He also identified short-term goal of working on communication with his parents, noting desire to understand their decision process of putting him in a facility instead of a group home setting.    MSE: Casually dressed and groomed, A+Ox3, mood "well" , fair range of affect, eye contact was WNL, speech was WNL, much more calm, TP linear and goal-directed, VH; negative for SI, HI, AH; insight was good, judgment was fair, actively engaged in session    Psychotherapy: Individual counseling was provided. Focus of session centered on supporting goals towards wellness and better communication. Reinforced taking turn in communication, expressing emotions in a calm manner, and listening to others' perspectives. Noted that his attitude towards his family will depend on the placement results, and that it will "take a long time to forgive them" if he gets placed in a facility. Encouraged him to communicate with his parents on his perspective while listening to their perspectives. Expressed appreciation of the session and said it was helpful. Shows persistent motivation towards change.    Plan: Will continue to follow    Anette Riedel, MS

## 2019-05-16 NOTE — Care Plan
Patient was in mileu this evening and interacting well with staff and peers. He was called out on a behavior where he approached a family member and told the family member that the peer he was visiting was on Haldol and Haldol did not work for him in the past. Jamie [name] was advised while in room not to do that again; he verbalized understanding. He was compliant with medication and remained calm and cooperative. Continue with plan of care.

## 2019-05-16 NOTE — Med Student Progress Note
Medical Student Progress Note -  Inpatient    NAME: Jamie [name]                                     MRN: [id number]                 DOB: August 28, 1996          AGE: 23 y.o.  ADMISSION DATE: 05/05/2019             DAYS ADMITTED: LOS: 11 days      Subjective:    Per staff, the pt has been calm and cooperative. There was one incident reported last night in which the pt approached the family member of another pt and stated that the pt was on Haldol and that didn't work for the pt before. Staff addressed the encounter with the pt and told him not to do that again, with which the pt expressed understanding.    On this encounter, the pt was resting in bed. Sleep was reported to be 7.25 hours and the pt noted he felt well-rested. He reports a good appetite and has been eating every meal. The pt has been attending and participating in group sessions.    The pt stated his mood was anxious but attributed this to the Level I screening scheduled for later in the morning. Upon checking in with him after screening, he appeared relieved and stated his mood was good. He reports he wants to try going without his Nicoderm patch today and states he will use the Nicorette gum to ease any cravings or withdrawal symptoms he might experience. The pt denies SI/HI and auditory hallucinations. He endorses visual hallucinations, describing them as floaters or stars in his vision at all times. The pt was encouraged to follow up with his optometrist regarding his vision. The pt denies N/V/D, chest pain, SOB, tics, or tremors. He notes his last bowel movement was last night.    Review of Systems:  Cardiovascular/Pulmonary: denies chest pain, SOB, cough  Gastrointestinal: denies N/V/D, constipation  Neurological: denies tics, tremors, EPS  Psychiatric: denies SI/HI and auditory hallucinations; + visual hallucinations    Objective:    Vitals:    05/14/19 1942 05/15/19 0900 05/15/19 1945 05/16/19 0700 BP: (!) 146/84 (!) 143/63 125/75 136/85   BP Source:   Arm, Right Upper    Pulse: 77 98 86 66   Temp: 37 ???C (98.6 ???F) 36.9 ???C (98.4 ???F) 36.7 ???C (98.1 ???F) 37.3 ???C (99.1 ???F)   SpO2: 99% 98% 96% 100%   Weight:       Height:         Sleep:  Hours of Sleep: 7.25  Quality of Sleep: Resting quietly    Scheduled Medications:  ARIPiprazole (ABILIFY) tablet 30 mg, 30 mg, Oral, QDAY  divalproex (DEPAKOTE ER) ER tablet 1,000 mg, 1,000 mg, Oral, QHS  folic acid (FOLVITE) tablet 1 mg, 1 mg, Oral, QDAY  melatonin tablet 5 mg, 5 mg, Oral, QHS  nicotine (NICODERM CQ STEP 3) 7 mg/day patch 1 patch, 1 patch, Transdermal, QDAY    And  Verification of Patch Placement and Integrity - Nicotine 7 MG/24HR, , Transdermal, BID  thiamine (VITAMIN B-1) tablet 100 mg, 100 mg, Oral, QDAY      PRN Medications:  acetaminophen Q6H PRN, docusate Q12H PRN, famotidine Q12H PRN, hydrOXYzine TID PRN, nicotine polacrilex Q1H PRN, OLANZapine Q6H PRN **OR**  OLANZapine Q6H PRN, traZODone QHS PRN    MENTAL STATUS EXAM  General/Constitutional: 23 y/o male; dressed in hospital attire; appears stated age; well-groomed  Behavior: Calm and cooperative, resting in bed  Speech/Motor: Normal rate, rhythm, volume, and tone. Hyperverbal.  Eye Contact: Good  Mood: Anxious before Level I Screen; good now  Affect: Euthymic, full range; mood congruent  Thought Process: Linear and logical, goal-directed  Associations: Intact  Thought Content: No SI/HI  Perception: No auditory hallucinations; floaters and stars in his vision  Insight/Judgment: Fair/Faire  Orientation: Oriented to person, place, and time  Recent and Remote Memory: Intact  Attention Span/Concentration: Good/Good  Language: English, fluent  Fund of Knowledge and Vocabulary: Above average          PHYSICAL EXAM  Musculoskeletal: moves all four extremities spontaneously  Neurological: no tics or tremors    Assessment:    Jamie [name] is a 23 y/o Caucasian male with hx of Schizoaffective Disorder, Tobacco Use Disorder, and Alcohol Use Disorder who presented to [facility] ED with concerns of mania. This appears to be precipitated by medication noncompliance. Factors that may have predisposed him to mental illness include genetics and substance use disorder. Protective factors include a supportive family, intelligence, and willingness to seek treatment. Proposed treatment has consisted of pharmacological management and individualized and group therapy.    DSM-5 DIAGNOSES:  Schizoaffective Disorder, bipolar type  Tobacco Use Disorder, mild  Alcohol Use Disorder, severe    Plan:    - Continue scheduled medications as ordered  - Continue PRN medications  - Continue folic acid and thiamine supplements  - Encourage participation in ward activities.      Jane Canary, MS3

## 2019-05-16 NOTE — Progress Notes
Service(s): Inpatient Group Psychotherapy     Patient data/presentation/complaint: Mr. Theil was seen in group therapy (1145 - 1230). He described his mood as pretty good today. Remains engaged in group discussion. Shared multiple points regarding group material and worked to provide support to peers during the meeting. No other issues noted.      MSE: casually dressed and groomed, A+Ox3, mood, pretty good full range of affect, speech WNL, eye contact was WNL, TP linear and goal-directed, TC no overt (or reported) evidence of SI/HI/AH/VH, I fair-good, J fair-good, active participation in therapy     Psychosocial Intervention(s): Supportive therapy with a focus on values and coping was provided. Response was positive    Plan: Will continue to follow on an inpatient basis.    Provider:     Lendon Ka, Ph.D., [phone]

## 2019-05-16 NOTE — Progress Notes
Chart check completed.

## 2019-05-17 MED ORDER — DIVALPROEX 500 MG PO TB24
500 mg | Freq: Every evening | ORAL | 0 refills | Status: DC
Start: 2019-05-17 — End: 2019-05-24
  Administered 2019-05-18 – 2019-05-23 (×6): 500 mg via ORAL

## 2019-05-17 NOTE — Progress Notes
Chart check completed.

## 2019-05-17 NOTE — Med Student Progress Note
Medical Student Progress Note -  Inpatient    NAME: Jamie [name]                                     MRN: [id number]                 DOB: 07-14-1996          AGE: 23 y.o.  ADMISSION DATE: 05/05/2019             DAYS ADMITTED: LOS: 12 days      Subjective:    Per staff, the pt has been calm and cooperative. Sleep was reported to be 7.5 hours and the pt reported feeling well-rested today. He reports a good appetite, noting he ate breakfast right before this encounter. The pt has been attending and participating in group sessions.    On this encounter, the pt was watching TV in the common area. He describes his mood as good and relieved after completing the Level I screening yesterday morning. The pt reported going without his Nicotine patch yesterday and stated he felt very tired throughout the day, so he decided to use the patch again today. He reports some abdominal discomfort after taking Trazodone 50mg  QHS, so he states he will try only taking the melatonin 5mg  tonight.    The pt denies SI/HI and auditory hallucinations. He continues to endorse visual hallucinations, describing them as floaters and stars in his vision. He states the hallucinations are not distressing, but he feels they can be distracting. He says he feels the [facility] can be inspirational, like an attachment to the divine. The pt denies N/V/D, chest pain, SOB, tics, or tremors at this time.    Review of Systems:  Cardiovascular: denies chest pain, SOB  Gastrointestinal: denies N/V/D, constipation  Neurological: denies tics, tremors, or EPS    Objective:    Vitals:    05/15/19 0900 05/15/19 1945 05/16/19 0700 05/16/19 2000   BP: (!) 143/63 125/75 136/85 135/78   BP Source:  Arm, Right Upper  Arm, Left Upper   Pulse: 98 86 66 92   Temp: 36.9 ???C (98.4 ???F) 36.7 ???C (98.1 ???F) 37.3 ???C (99.1 ???F) 37 ???C (98.6 ???F)   SpO2: 98% 96% 100% 94%   Weight:       Height:         Sleep:  Hours of Sleep: 7.5 hours Quality of Sleep: resting quietly    Scheduled Medications:  ARIPiprazole (ABILIFY) tablet 30 mg, 30 mg, Oral, QDAY  divalproex (DEPAKOTE ER) ER tablet 500 mg, 500 mg, Oral, QHS  folic acid (FOLVITE) tablet 1 mg, 1 mg, Oral, QDAY  melatonin tablet 5 mg, 5 mg, Oral, QHS  nicotine (NICODERM CQ STEP 3) 7 mg/day patch 1 patch, 1 patch, Transdermal, QDAY    And  Verification of Patch Placement and Integrity - Nicotine 7 MG/24HR, , Transdermal, BID  thiamine (VITAMIN B-1) tablet 100 mg, 100 mg, Oral, QDAY      PRN Medications:  acetaminophen Q6H PRN, calcium carbonate BID PRN, docusate Q12H PRN, hydrOXYzine TID PRN, nicotine polacrilex Q1H PRN, OLANZapine Q6H PRN **OR** OLANZapine Q6H PRN, traZODone QHS PRN    MENTAL STATUS EXAM    General/Constitutional: 23 y/o male; dressed in hospital attire; appears stated age; well-groomed  Behavior: Calm and cooperative  Speech/Motor: Normal rate, rhythm, volume, and tone. Hyperverbal.  Eye Contact: Good  Mood: Good and  Relieved  Affect: Euthymic, full range; mood congruent  Thought Process: Linear and logical, goal-directed  Associations: Intact  Thought Content: No SI/HI  Perception: No auditory hallucinations; floaters and stars in his vision  Insight/Judgment: Fair/Fair  Orientation: Oriented to person, place, and time  Recent and Remote Memory: Intact  Attention Span/Concentration: Good/Good  Language: English, fluent  Fund of Knowledge and Vocabulary: Above average           PHYSICAL EXAM  Musculoskeletal: moves all four extremities spontaneously  Neurological: no tics or tremors    Assessment:    Jamie [name] is a 23 y/o Caucasian male with hx of Schizoaffective Disorder, Tobacco Use Disorder, and Alcohol Use Disorder who presented to the [facility] ED with concerns of mania. This appears to be precipitated by medication noncompliance and polysubstance abuse in the weeks leading up to this presentation. Factors that may have predisposed him to mental illness include genetics and substance use disorders. Protective factors include a supportive family, intelligence, and willingness to seek treatment. Proposed treatment is to decrease Depakote ER to 500mg  QHS and continue all other medications as ordered.    DSM-5 DIAGNOSES:  - Schizoaffective Disorder, bipolar type  - Tobacco Use Disorder, mild  - Alcohol Use Disorder, severe      Plan:    - Decrease Depakote ER to 500mg  tablet, PO, QHS  - Continue all other medications as ordered  - Continue folic acid and thiamine supplements  - Encourage participation in ward activities      Jane Canary, MS3

## 2019-05-17 NOTE — Care Plan
Patient was in mileu this evening and interacting well with staff and peers. He was calm, cooperative and compliant with care and medications. He denied SI/HI,AH but admits to having visual hallucinations of shadows. He reports he did his screening and awaiting on result so he can discharge. Continue with plan of care.

## 2019-05-17 NOTE — Progress Notes
PSYCHIATRIC PROGRESS NOTE     LOS: 12 days  Voluntary/Involuntary Status: Voluntary  Guardian? Parents        MEDICATIONS   Current Psychotropic Medications:   1. Aripiprazole 30mg  PO daily (Aristada received 882mg  on 7/10)  2. Depakote ER 500mg  PO qHS  3. Folic acid 1mg  PO daily  4. Thiamine 100mg  PO daily  5. Melatonin 5mg  qhs        ASSESSMENT & DIAGNOSES   Jamie [name] is a 23 y.o. Caucasian male with a history of schizoaffective disorder who presented to [facility] ED with concerns of mania. This appears to be precipitated by medication noncompliance.  Factors that seem to have predisposed him to mental illness include genetics and substance use disorder. This current problem is maintained by substance use and medication noncompliance. However, protective factors include supportive family, intelligence and willingness to seek treatement. Proposed treatment has consisted of pharmacological management and individualized & group therapy.  ???  DSM-5 DIAGNOSES:  ??? Schizoaffective disorder, bipolar type, current episode manic, severe, with psychotic features           ??? Tobacco use disorder, mild  ??? Alcohol use disorder, severe       PLAN     ??? Decreased Depakote ER to 500mg  qhs   ??? Received LAI Aristada 05/10/19 + 21 day overlap of aripiprazole 30mg  PO daily.  ??? Continue folic acid and thiamine supplements.  ??? Pending Level I screening results  ??? Encourage participation in ward activities.    AIMS to be performed this admission     Metabolic labs completed on:  Lipid Panel:   LDL   Date Value Ref Range Status   05/06/2019 101 (H) <100 mg/dL Final     HDL   Date Value Ref Range Status   05/06/2019 46 >40 MG/DL Final     VLDL   Date Value Ref Range Status   05/06/2019 33 MG/DL Final     Cholesterol   Date Value Ref Range Status   05/06/2019 164 <200 MG/DL Final     Triglycerides   Date Value Ref Range Status   05/06/2019 165 (H) <150 MG/DL Final       Blood Glucose:  Hemoglobin A1C   Date Value Ref Range Status 05/06/2019 5.1 4.0 - 6.0 % Final     Comment:     The ADA recommends that most patients with type 1 and type 2 diabetes maintain   an A1c level <7%.         Dispositon: Maintain admission for safety and stabilization.  Will go to home upon discharge.    Seen and discussed with Dr. Shea Evans.    ______________________________________________________________     SUBJECTIVE     Per staff, no behavioral issues reported. He is calm and cooperative. Taking medications. Spending time in common areas. Slept 7.5 hours.   Group attendance? Participating and attending.     Jamie [name] is doing well. He feels confident that he would not meet criteria for level I placement. He is interested in having a family meeting prior to discharge. He denied SI/HI/AH. Patient looking forward to discharging but feels anxious about parents limiting his independence.        REVIEW OF SYSTEMS   Review of Systems   Respiratory: Negative for shortness of breath.    Cardiovascular: Negative for chest pain.   Gastrointestinal: Negative for abdominal pain, constipation, diarrhea and nausea.   Neurological: Negative for headaches.   Psychiatric/Behavioral: Positive  for hallucinations. Negative for behavioral problems, dysphoric mood, sleep disturbance and suicidal ideas. The patient is nervous/anxious.         OBJECTIVE                    Vital Signs:  Current                Vital Signs: 24 Hour Range   BP: 135/78 (07/16 2000)  Temp: 37 ???C (98.6 ???F) (07/16 2000)  Pulse: 92 (07/16 2000)  Respirations: 18 PER MINUTE (07/16 2000)  SpO2: 94 % (07/16 2000) BP: (135)/(78)   Temp:  [37 ???C (98.6 ???F)]   Pulse:  [92]   Respirations:  [18 PER MINUTE]   SpO2:  [94 %]    Intensity Pain Scale (Self Report): (not recorded)      Sleep:  Hours of Sleep: 7.50   Quality of Sleep: Resting Quietly    Scheduled Medications:  ARIPiprazole (ABILIFY) tablet 30 mg, 30 mg, Oral, QDAY  divalproex (DEPAKOTE ER) ER tablet 500 mg, 500 mg, Oral, QHS folic acid (FOLVITE) tablet 1 mg, 1 mg, Oral, QDAY  melatonin tablet 5 mg, 5 mg, Oral, QHS  nicotine (NICODERM CQ STEP 3) 7 mg/day patch 1 patch, 1 patch, Transdermal, QDAY    And  Verification of Patch Placement and Integrity - Nicotine 7 MG/24HR, , Transdermal, BID  thiamine (VITAMIN B-1) tablet 100 mg, 100 mg, Oral, QDAY        PRN Medications:  acetaminophen Q6H PRN 650 mg at 05/05/19 0411, calcium carbonate BID PRN, docusate Q12H PRN, hydrOXYzine TID PRN 25 mg at 05/06/19 2041, nicotine polacrilex Q1H PRN 2 mg at 05/17/19 1454, OLANZapine Q6H PRN **OR** OLANZapine Q6H PRN, traZODone QHS PRN 50 mg at 05/16/19 2045    Mental Status Exam:  ??? General/Constitutional: 23 y.o. male, dressed in hospital attire, appears as stated age, well-groomed  ??? Behavior: Overall calm and cooperative   ??? Speech/Motor: Normal rate, rhythm, volume and tone.   ??? Eye Contact: Good  ??? Mood: good  ??? Affect: Euthymic, full range; mood congruent  ??? Thought Process: Linear and logical, goal directed  ??? Associations: Intact  ??? Thought Content: No suicidal or homicidal ideations  ??? Perception: No AH. Chronic VH.  ??? Insight/Judgment: Fair/Fair  ???  ??? Orientation: Clear sensorium   ??? Recent and Remote Memory: Intact  ??? Attention span and concentration: Fair  ??? Language: English, fluent  ??? Fund of knowledge and vocabulary: Above average     Focused Physical Exam:  ??? Musculoskeletal: moves all extremities spontaneously   ??? Neurological: no tics or tremors    ______________________________________________________________  Era Skeen, MD

## 2019-05-17 NOTE — Care Plan
Pt is compliant with his morning meds and vital signs, tolerating breakfast well. Pt is calm, cooperative, interacting well with peers and staffs, attending groups, met with daily teams, no behavioral issues noted. Pt denies pain, SI, HI and AH however still endorsing intermittent visual hallucination, pt reports he is seeing shadow. Pt seems to have bright affect this morning, pt asked for his nicorette gum several times throughout the days, will continue monitoring pt's safety and behavior.

## 2019-05-17 NOTE — Progress Notes
TREATMENT TEAM NOTE  Name: Jamie [name]        MRN: [id number]          DOB: 1995-11-26          Age: 23 y.o.  Admission Date: 05/05/2019             LOS: 12 days      Attendees: Psychiatry: Earle Gell. Damita Dunnings, DO  UR: Caroline More, RN  Nursing: Jennette Kettle, RN  CM: Neita Carp, LMSW  CM: Wonda Cheng, LMSW  Therapy: Donovan Kail, Deeann Cree, [place] [facility]  Pharmacy: Doreen Beam, PharmD     Significant Points Discussed: endorsing VH, level I screen completed yesterday- triggered Level II; still need documentation for financial status for him to have coverage of Long acting injectable on the outpatient side    Treatment Plan Discussion: continue to engage in treatment

## 2019-05-18 NOTE — Progress Notes
Case Management Progress Note    NAME:Jamie [name] MRN: [id number] DOB:Mar 31, 1996 AGE: 23 y.o.  ADMISSION DATE: 05/05/2019 DAYS ADMITTED: LOS: 13 days     Date of Service: 05/18/2019    Patient's guardian e-mailed this Probation officer signed documents for CARE assessment. This Probation officer e-mailed care assessment to [facility].

## 2019-05-18 NOTE — Progress Notes
Brief Internal Medicine Note    Patient not seen since admission. Note to further clarify Heart failure.     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 He was started on GDMT and his EF improved from 25-50%. On his annual cardiology visit ( 07/03/18), the echocardiogram showed normal ejection fraction however LV volume index was still mildly dilated LV volume index of 26mL/m [place]. Per documentation, Dr. Lyndel Safe planned to proceed with genetic study in[place] for ruling out cardiomyopathy as he has a family history of a grandfather having heart failure. At that visit he was started on Lisinopril 2.5 mg daily.  Per my initial discussion, he reported that he stopped taking his antihypertensives due to cost.     Recommendations:  - If agreeable and vitals tolerate, would encourage patient to resume GDMT Lisinopril is 2.5 mg ( initiated (07/2018)  daily, Coreg 6.25 mg twice daily and spironolactone 25 mg daily.  - Pharmacy or SW to assist with medication resources or coupons.  - Reduce sodium intake  - Schedule a follow up appointment with Dr. Lyndel Safe in September to discuss plan of care moving forward.     Monike Bragdon Allyne Gee APRN  [facility] pager [phone]  [place] available via [place].

## 2019-05-18 NOTE — Progress Notes
PSYCHIATRIC PROGRESS NOTE     LOS: 13 days  Voluntary/Involuntary Status: Voluntary  Guardian? Parents        MEDICATIONS   Current Psychotropic Medications:   1. Aripiprazole 30mg  PO daily (Aristada received 882mg  on 7/10)  2. Depakote ER 500mg  PO qHS  3. Folic acid 1mg  PO daily  4. Thiamine 100mg  PO daily  5. Melatonin 5mg  qhs        ASSESSMENT & DIAGNOSES   Jamie [name] is a 23 y.o. Caucasian male with a history of schizoaffective disorder who presented to [facility] ED with concerns of mania. This appears to be precipitated by medication noncompliance.  Factors that seem to have predisposed him to mental illness include genetics and substance use disorder. This current problem is maintained by substance use and medication noncompliance. However, protective factors include supportive family, intelligence and willingness to seek treatement. Proposed treatment has consisted of pharmacological management and individualized & group therapy.  ???  DSM-5 DIAGNOSES:  ??? Schizoaffective disorder, bipolar type, current episode manic, severe, with psychotic features           ??? Tobacco use disorder, mild  ??? Alcohol use disorder, severe       PLAN     No changes to plan from 05/17/2019 as outlined by Dr. Steva Colder.    AIMS to be performed this admission     Metabolic labs completed on:  Lipid Panel:   LDL   Date Value Ref Range Status   05/06/2019 101 (H) <100 mg/dL Final     HDL   Date Value Ref Range Status   05/06/2019 46 >40 MG/DL Final     VLDL   Date Value Ref Range Status   05/06/2019 33 MG/DL Final     Cholesterol   Date Value Ref Range Status   05/06/2019 164 <200 MG/DL Final     Triglycerides   Date Value Ref Range Status   05/06/2019 165 (H) <150 MG/DL Final       Blood Glucose:  Hemoglobin A1C   Date Value Ref Range Status   05/06/2019 5.1 4.0 - 6.0 % Final     Comment:     The ADA recommends that most patients with type 1 and type 2 diabetes maintain   an A1c level <7%. Dispositon: Maintain admission for safety and stabilization.  Will go to home upon discharge.    Seen and discussed with Dr. Para March.    ______________________________________________________________     Althea Charon is seen today for follow-up and reports doing pretty good.  States that he is feeling a little anxious but feels that his thoughts are back to normal rate.  States that his sleep has been slightly erratic but he has been getting 7 to 9 hours per night.  He reports that [facility] are always present as shapes or floaters going through his vision however has not had any florid hallucinations such as seeing people or animals.  Denies SI/HI/AH.  Reports that he has been having more frequent bowel movements but overall is doing well.  Stated interest in discharging before Thursday as he has an interview for a job lined up on Thursday.       REVIEW OF SYSTEMS   Review of Systems   Respiratory: Negative for cough and shortness of breath.    Cardiovascular: Negative for chest pain.   Gastrointestinal: Negative for diarrhea, nausea and vomiting.        Increase in BM  frequency   Neurological: Negative for dizziness, light-headedness and headaches.   Psychiatric/Behavioral: Negative for hallucinations and suicidal ideas.        OBJECTIVE                    Vital Signs:  Current                Vital Signs: 24 Hour Range   BP: 125/78 (07/18 0700)  Temp: 37.1 ???C (98.8 ???F) (07/18 0700)  Pulse: 81 (07/18 0700)  Respirations: 16 PER MINUTE (07/18 0700)  SpO2: 100 % (07/18 0700) BP: (125-135)/(78-84)   Temp:  [37.1 ???C (98.8 ???F)-37.2 ???C (98.9 ???F)]   Pulse:  [81-89]   Respirations:  [16 PER MINUTE]   SpO2:  [98 %-100 %]    Intensity Pain Scale (Self Report): (not recorded)      Sleep:  Hours of Sleep: 6.25   Quality of Sleep: Resting Quietly    Scheduled Medications:  ARIPiprazole (ABILIFY) tablet 30 mg, 30 mg, Oral, QDAY  divalproex (DEPAKOTE ER) ER tablet 500 mg, 500 mg, Oral, QHS folic acid (FOLVITE) tablet 1 mg, 1 mg, Oral, QDAY  melatonin tablet 5 mg, 5 mg, Oral, QHS  nicotine (NICODERM CQ STEP 3) 7 mg/day patch 1 patch, 1 patch, Transdermal, QDAY    And  Verification of Patch Placement and Integrity - Nicotine 7 MG/24HR, , Transdermal, BID  thiamine (VITAMIN B-1) tablet 100 mg, 100 mg, Oral, QDAY        PRN Medications:  acetaminophen Q6H PRN 650 mg at 05/18/19 1036, calcium carbonate BID PRN, docusate Q12H PRN, hydrOXYzine TID PRN 25 mg at 05/06/19 2041, nicotine polacrilex Q1H PRN 2 mg at 05/18/19 1036, OLANZapine Q6H PRN **OR** OLANZapine Q6H PRN, traZODone QHS PRN 50 mg at 05/16/19 2045    MENTAL STATUS EXAMINATION  ??? General/Constitutional: appears stated age, dressed in hospital clothes, appropriate grooming  ??? Eye Contact: good  ??? Behavior: Calm, cooperative; appropriate for conversation  ??? Speech: Normal rate, volume, and tone, minimally pressured. Good articulation, spontaneous  ??? Mood: Pretty good  ??? Affect: Upbeat, full; mood congruent  ??? Thought Process: Circumstantial  ??? Thought Content: denies SI/HI  ??? Perception: Denies florid AVH (some VH as above), no evidence of paranoia or delusions  ??? Associations: Intact  ??? Insight: Fair to good  ??? Judgment: Good      Physical Exam:  ??? Eyes: EOMI, no scleral icterus noted   ??? Musculoskeletal: Moves all four extremities spontaneously       ______________________________________________________________  Milana Kidney, DO

## 2019-05-18 NOTE — Care Plan
Patient oriented x4.  Able to ask to have needs met appropriately.  Denies AH, VH.Denies SI, HI.  No harm to self or others on unit.Patient compliant with vital signs, meals and medications.  Attends unit activities and groups with appropriate behavior with peers and staff. Patient expresses excitement over possible employment opportunity post discharge. Patient mood and behavior stable at this time. Patient aware of plan of care and need for continued treatment. Meeting with treatment team daily.    Problem: Discharge Planning  Goal: Participation in plan of care  Outcome: Goal Ongoing  Goal: Knowledge regarding plan of care  Outcome: Goal Ongoing  Goal: Prepared for discharge  Outcome: Goal Ongoing     Problem: Health Maintenance - Impaired  Goal: Able to perfom ADL's  Outcome: Goal Ongoing  Goal: Adequate nutritional intake  Outcome: Goal Ongoing  Goal: Establish therapeutic relationships  Outcome: Goal Ongoing  Goal: Knowledge of health maintenance  Outcome: Goal Ongoing     Problem: Mood - Altered  Goal: Stabilize mood  Outcome: Goal Ongoing  Goal: Knowledge of Altered Mood  Outcome: Goal Ongoing     Problem: Self-esteem - Low  Goal: Demonstration of positive self-esteem  Outcome: Goal Ongoing  Goal: Knowledge of low self-esteem  Outcome: Goal Ongoing     Problem: Thought Process - Altered  Goal: Demonstration of organized thought processes  Outcome: Goal Ongoing  Goal: Knowledge of altered thought process  Outcome: Goal Ongoing     Problem: Violence, self/other-directed, Risk of  Goal: Absence of violence  Outcome: Goal Ongoing  Goal: Knowledge of risk for violence, self/other-directed  Outcome: Goal Ongoing     Problem: Harm to Self/Others, Risk of (Non-Psychiatric Patient)  Goal: Absence of Harm to Self/Others  Outcome: Goal Ongoing     Problem: Tobacco Use  Goal: Knowledge of tobacco-use cessation methods  Outcome: Goal Ongoing

## 2019-05-19 NOTE — Care Plan
Pt has been out in the milieu watching tv,playing games and attended wrap up group.Bright affect.Denies feeling depressed or anxious but continues to see blurry things as well double visions.He says they are getting worse.He sees them when sleeping too but he eventually gets tired and falls into deep sleep.Calm,co-operative and med compliant.Denies si/hi.

## 2019-05-19 NOTE — Progress Notes
Chart check completed.

## 2019-05-19 NOTE — Care Plan
Patient oriented x4.  Able to ask to have needs met appropriately.  Denies AH, VH. Denies SI, HI.  No harm to self or others on unit. Patient compliant with vital signs, meals and medications.  Attends unit activities and groups with appropriate behavior with peers and staff. Patient mood and behavior stable at this time. Patient aware of plan of care and need for continued treatment. Meeting with treatment team daily.    Problem: Discharge Planning  Goal: Participation in plan of care  Outcome: Goal Ongoing  Goal: Knowledge regarding plan of care  Outcome: Goal Ongoing  Goal: Prepared for discharge  Outcome: Goal Ongoing     Problem: Health Maintenance - Impaired  Goal: Able to perfom ADL's  Outcome: Goal Ongoing  Goal: Adequate nutritional intake  Outcome: Goal Ongoing  Goal: Establish therapeutic relationships  Outcome: Goal Ongoing  Goal: Knowledge of health maintenance  Outcome: Goal Ongoing     Problem: Mood - Altered  Goal: Stabilize mood  Outcome: Goal Ongoing  Goal: Knowledge of Altered Mood  Outcome: Goal Ongoing     Problem: Self-esteem - Low  Goal: Demonstration of positive self-esteem  Outcome: Goal Ongoing  Goal: Knowledge of low self-esteem  Outcome: Goal Ongoing     Problem: Thought Process - Altered  Goal: Demonstration of organized thought processes  Outcome: Goal Ongoing  Goal: Knowledge of altered thought process  Outcome: Goal Ongoing     Problem: Violence, self/other-directed, Risk of  Goal: Absence of violence  Outcome: Goal Ongoing  Goal: Knowledge of risk for violence, self/other-directed  Outcome: Goal Ongoing     Problem: Harm to Self/Others, Risk of (Non-Psychiatric Patient)  Goal: Absence of Harm to Self/Others  Outcome: Goal Ongoing     Problem: Tobacco Use  Goal: Knowledge of tobacco-use cessation methods  Outcome: Goal Ongoing

## 2019-05-19 NOTE — Progress Notes
PSYCHIATRIC PROGRESS NOTE     LOS: 14 days  Voluntary/Involuntary Status: Voluntary  Guardian? Parents        MEDICATIONS   Current Psychotropic Medications:   1. Aripiprazole 30mg  PO daily (Aristada received 882mg  on 7/10)  2. Depakote ER 500mg  PO qHS  3. Folic acid 1mg  PO daily  4. Thiamine 100mg  PO daily  5. Melatonin 5mg  qhs        ASSESSMENT & DIAGNOSES   Jamie [name] is a 23 y.o. Caucasian male with a history of schizoaffective disorder who presented to [facility] ED with concerns of mania. This appears to be precipitated by medication noncompliance.  Factors that seem to have predisposed him to mental illness include genetics and substance use disorder. This current problem is maintained by substance use and medication noncompliance. However, protective factors include supportive family, intelligence and willingness to seek treatement. Proposed treatment has consisted of pharmacological management and individualized & group therapy.  ???  DSM-5 DIAGNOSES:  ??? Schizoaffective disorder, bipolar type, current episode manic, severe, with psychotic features           ??? Tobacco use disorder, mild  ??? Alcohol use disorder, severe       PLAN     ??? No changes to treatment plan as outlined by Dr. Steva Colder on 7/17.  ??? Continue Depakote ER 500mg  qhs   ??? Received LAI Aristada 05/10/19 + 21 day overlap of aripiprazole 30mg  PO daily. (stop date for PO 7/31)  ??? Continue folic acid and thiamine supplements.  ??? Pending Level I screening results    ??? Encourage participation in ward activities.    AIMS to be performed this admission prior to discharge.    Metabolic labs completed on:  Lipid Panel:   LDL   Date Value Ref Range Status   05/06/2019 101 (H) <100 mg/dL Final     HDL   Date Value Ref Range Status   05/06/2019 46 >40 MG/DL Final     VLDL   Date Value Ref Range Status   05/06/2019 33 MG/DL Final     Cholesterol   Date Value Ref Range Status   05/06/2019 164 <200 MG/DL Final     Triglycerides Date Value Ref Range Status   05/06/2019 165 (H) <150 MG/DL Final       Blood Glucose:  Hemoglobin A1C   Date Value Ref Range Status   05/06/2019 5.1 4.0 - 6.0 % Final     Comment:     The ADA recommends that most patients with type 1 and type 2 diabetes maintain   an A1c level <7%.         Dispositon: Maintain admission for safety and stabilization.  Will go to home upon discharge.    Seen and discussed with Dr. Para March.    ______________________________________________________________     SUBJECTIVE     Per staff, no behavioral issues reported. He is calm and cooperative. Taking medications. Spending time in common areas. Slept 7.5 hours. Participating and attending group sessions.    Jamie [name] is doing well. Expresses anxiety about remaining inpatient as he has a job interview coming up on 7/23 but has been waiting for information about facility placement. Denies SI/HI/AH but reports ongoing visual hallucinations of various celestial phenomena as well as floaters. States his thoughts have slowed to more like me levels though not significantly elevated or racing.        REVIEW OF SYSTEMS   Review of Systems   Respiratory:  Negative for shortness of breath.    Cardiovascular: Negative for chest pain.   Gastrointestinal: Negative for abdominal pain, constipation, diarrhea and nausea.   Neurological: Negative for headaches.   Psychiatric/Behavioral: Positive for hallucinations. Negative for behavioral problems, dysphoric mood, sleep disturbance and suicidal ideas. The patient is nervous/anxious.         OBJECTIVE                    Vital Signs:  Current                Vital Signs: 24 Hour Range   BP: 130/75 (07/19 0700)  Temp: 36.7 ???C (98.1 ???F) (07/19 0700)  Pulse: 65 (07/19 0700)  Respirations: 14 PER MINUTE (07/19 0700)  SpO2: 99 % (07/19 0700) BP: (130-153)/(75-80)   Temp:  [36.7 ???C (98.1 ???F)-36.8 ???C (98.2 ???F)]   Pulse:  [65-83]   Respirations:  [14 PER MINUTE]   SpO2:  [99 %] Intensity Pain Scale (Self Report): (not recorded)      Sleep:  Hours of Sleep: 7.5   Quality of Sleep: Resting Quietly    Scheduled Medications:  ARIPiprazole (ABILIFY) tablet 30 mg, 30 mg, Oral, QDAY  divalproex (DEPAKOTE ER) ER tablet 500 mg, 500 mg, Oral, QHS  folic acid (FOLVITE) tablet 1 mg, 1 mg, Oral, QDAY  melatonin tablet 5 mg, 5 mg, Oral, QHS  nicotine (NICODERM CQ STEP 3) 7 mg/day patch 1 patch, 1 patch, Transdermal, QDAY    And  Verification of Patch Placement and Integrity - Nicotine 7 MG/24HR, , Transdermal, BID  thiamine (VITAMIN B-1) tablet 100 mg, 100 mg, Oral, QDAY        PRN Medications:  acetaminophen Q6H PRN 650 mg at 05/18/19 1036, calcium carbonate BID PRN, docusate Q12H PRN, hydrOXYzine TID PRN 25 mg at 05/06/19 2041, nicotine polacrilex Q1H PRN 2 mg at 05/18/19 1918, OLANZapine Q6H PRN **OR** OLANZapine Q6H PRN, traZODone QHS PRN 50 mg at 05/18/19 2133    Mental Status Exam:  ??? General/Constitutional: 23 y.o. male, dressed in hospital attire, appears stated age, well-groomed  ??? Behavior: Overall calm and cooperative   ??? Speech/Motor: Normal rate, rhythm, volume and tone.   ??? Eye Contact: Good  ??? Mood: good  ??? Affect: Euthymic, full range; mood congruent  ??? Thought Process: Linear and logical, goal directed  ??? Associations: Intact  ??? Thought Content: Negative for SI/HI, negative for delusions  ??? Perception: Negative for AH. Chronic VH as previously stated  ??? Insight/Judgment: Fair/Fair  ???  ??? Orientation: Clear sensorium   ??? Recent and Remote Memory: Intact  ??? Attention span and concentration: Fair  ??? Language: English, fluent  ??? Fund of knowledge and vocabulary: Above average     Focused Physical Exam:  ??? Musculoskeletal: moves all extremities spontaneously   ??? Neurological: no tics or tremors    ______________________________________________________________    Mcarthur Rossetti. Robb Matar, MD, MLS(ASCP)cm  PGY-1, Psychiatry  05/19/2019 2:21 PM   Pager [phone] or Voalte/Cureatr

## 2019-05-20 MED ORDER — CARVEDILOL 6.25 MG PO TAB
6.25 mg | Freq: Two times a day (BID) | ORAL | 0 refills | Status: DC
Start: 2019-05-20 — End: 2019-05-24
  Administered 2019-05-21 – 2019-05-23 (×6): 6.25 mg via ORAL

## 2019-05-20 MED ORDER — SPIRONOLACTONE 25 MG PO TAB
25 mg | Freq: Every day | ORAL | 0 refills | Status: DC
Start: 2019-05-20 — End: 2019-05-24
  Administered 2019-05-20 – 2019-05-23 (×4): 25 mg via ORAL

## 2019-05-20 MED ORDER — LISINOPRIL 5 MG PO TAB
2.5 mg | Freq: Every day | ORAL | 0 refills | Status: DC
Start: 2019-05-20 — End: 2019-05-24
  Administered 2019-05-20 – 2019-05-23 (×4): 2.5 mg via ORAL

## 2019-05-20 NOTE — Med Student Progress Note
Medical Student Progress Note -  Inpatient    NAME: Jamie [name]                                     MRN: [id number]                 DOB: Feb 27, 1996          AGE: 23 y.o.  ADMISSION DATE: 05/05/2019             DAYS ADMITTED: LOS: 15 days        PSYCHIATRIC PROGRESS NOTE     LOS: 15 days  Voluntary/Involuntary Status: Voluntary  Guardian? Krygier, [name]       MEDICATIONS   Current Psychotropic Medications:   1. Aripiprazole tablet 30mg  PO daily  2. Divalproex ER tablet 500mg  PO QHS  3. Trazodone tablet 100mg  PO QHS  4. Aripiprazole lauroxil injection 882mg  IM Q30 days, administered 05/10/2019       ASSESSMENT & DIAGNOSES   Jamie [name] is a 23 y.o. Caucasian male with a history of schizoaffective disorder, bipolar type who presented to [facility] ED with concerns of mania. This appears to be precipitated by medication noncompliance.  Factors that seem to have predisposed him to mental illness include genetics and substance use disorder.  This current problem is maintained by substance use and medication noncompliance. However, protective factors include supportive family, intelligence, and willingness to seek treatment. Proposed treatment has consisted of pharmacological management and individualized & group therapy.    DSM-5 DIAGNOSES:  1. Schizoaffective disorder, bipolar type, current episode manic, severe, with psychotic features  2. Tobacco use disorder, mild  3. Alcohol use disorder, severe       PLAN     ? No indication for psychotropic medication changes on 05/20/19.  o Continue divalproex ER 500mg  QHS  o Received LAI Aristada on 05/10/2019 + 21 day overlap of aripiprazole 30mg  PO daily (until 7/31)  o Continue folic acid and thiamine  o Pending Level 1 screening results    AIMS to be performed prior to discharge.    Metabolic labs completed on:  Lipid Panel:   LDL   Date Value Ref Range Status   05/06/2019 101 (H) <100 mg/dL Final     HDL   Date Value Ref Range Status 05/06/2019 46 >40 MG/DL Final     VLDL   Date Value Ref Range Status   05/06/2019 33 MG/DL Final     Cholesterol   Date Value Ref Range Status   05/06/2019 164 <200 MG/DL Final     Triglycerides   Date Value Ref Range Status   05/06/2019 165 (H) <150 MG/DL Final       Blood Glucose:  Hemoglobin A1C   Date Value Ref Range Status   05/06/2019 5.1 4.0 - 6.0 % Final     Comment:     The ADA recommends that most patients with type 1 and type 2 diabetes maintain   an A1c level <7%.         Disposition: Maintain admission for safety and stabilization.  Will go to home upon discharge.    ____________________________________________________________     SUBJECTIVE     No behavioral issues reported. Jamie [name] is taking all medications, participating in groups, and is calm and cooperative on the unit. He slept 6 hours last night, reported restless sleep with 2-3 awakenings. He is consistently eating  100% of his meals.    Jamie [name] was seen on the unit and appears to be doing well. He denies SI/HI and auditory hallucinations but still endorses floaters and halos in his visual field. It is unclear whether these are hallucinations or a separate ophthalmologic phenomenon. He expressed that he would like to leave by Thursday so he can attend a job interview and mentioned that he didn't want to talk about his parents or their desire for a residential placement for him, as that may make him angry. He stated that his thoughts are no longer racing, but seem to be back to his baseline.       REVIEW OF SYSTEMS   Review of Systems   HENT: Positive for rhinorrhea.    Eyes: Positive for visual disturbance.   Respiratory: Negative for cough and shortness of breath.    Cardiovascular: Negative for chest pain.   Gastrointestinal: Negative for abdominal pain, constipation, diarrhea, nausea and vomiting.   Neurological: Negative for tremors and headaches.   Psychiatric/Behavioral: The patient is nervous/anxious.           OBJECTIVE Vital Signs:  Current                Vital Signs: 24 Hour Range   BP: 125/55 (07/20 0800)  Temp: 36.9 ???C (98.4 ???F) (07/20 0800)  Pulse: 54 (07/20 0800)  Respirations: 18 PER MINUTE (07/20 0800)  SpO2: 99 % (07/20 0800) BP: (125-144)/(55-85)   Temp:  [36.5 ???C (97.7 ???F)-36.9 ???C (98.4 ???F)]   Pulse:  [54-69]   Respirations:  [18 PER MINUTE-19 PER MINUTE]   SpO2:  [98 %-99 %]    Intensity Pain Scale (Self Report): (not recorded)      Sleep:  Hours of Sleep: 6.0   Quality of Sleep: Resting Quietly    Scheduled Medications:  ARIPiprazole (ABILIFY) tablet 30 mg, 30 mg, Oral, QDAY  divalproex (DEPAKOTE ER) ER tablet 500 mg, 500 mg, Oral, QHS  folic acid (FOLVITE) tablet 1 mg, 1 mg, Oral, QDAY  melatonin tablet 5 mg, 5 mg, Oral, QHS  nicotine (NICODERM CQ STEP 3) 7 mg/day patch 1 patch, 1 patch, Transdermal, QDAY    And  Verification of Patch Placement and Integrity - Nicotine 7 MG/24HR, , Transdermal, BID  thiamine (VITAMIN B-1) tablet 100 mg, 100 mg, Oral, QDAY        PRN Medications:  acetaminophen Q6H PRN 650 mg at 05/18/19 1036, calcium carbonate BID PRN, docusate Q12H PRN, hydrOXYzine TID PRN 25 mg at 05/06/19 2041, nicotine polacrilex Q1H PRN 2 mg at 05/18/19 1918, OLANZapine Q6H PRN **OR** OLANZapine Q6H PRN, traZODone QHS PRN 50 mg at 05/19/19 2053    Mental Status Exam:  ??? General/Constitutional: 23 y.o. male, appears stated age, wearing hospital attire, appropriate grooming  ??? Behavior: Overall pleasant and cooperative  ??? Speech/Motor: normal rate, rhythm, and volume; talkative. No abnormal movements.  ??? Eye Contact: good  ??? Mood: anxiously hopeful  ??? Affect: euthymic  ??? Thought Process: linear  ??? Associations: intact  ??? Thought Content: no delusions, no SI/HI  ??? Perception: no auditory hallucinations, reported visual floaters and halos  ??? Insight/Judgment: fair/fair    ??? Orientation: A&Ox3  ??? Recent and Remote Memory: intact  ??? Attention span and concentration: normal ??? Language: fluent [place] speaker  ??? Fund of knowledge and vocabulary: average/above average     ______________________________________________________________  Sinclair Ship, MS3

## 2019-05-20 NOTE — Progress Notes
TREATMENT TEAM NOTE  Name: Jamie [name]        MRN: [id number]          DOB: May 15, 1996          Age: 23 y.o.  Admission Date: 05/05/2019             LOS: 15 days        Attendees:   Psychiatry: Earle Gell. Damita Dunnings, DO  Psychiatry: Carmie End, DO  UR: Lina Sar, RN  Nursing:  Mancel Bale  Nursing Manager:  Ralene Ok, MSN  CM: Neita Carp, LMSW  CM: Sherlyn Lick, [facility]  Therapy: Wetzel Bjornstad, [facility], LCP  Pharmacy: Rudene Christians, PharmD  Psychology: Durel Salts, PsyD    Significant Points Discussed:   Level 1 completed; Level 2 triggered  Patient reported that he has a job interview on Thursday and wants to be discharged  His mother is his Guardian so discharge requires action on her part  Psychology following    Treatment Plan Discussion:   Continue treatment

## 2019-05-20 NOTE — Care Plan
Problem: Discharge Planning  Goal: Participation in plan of care  Outcome: Goal Ongoing  Goal: Knowledge regarding plan of care  Outcome: Goal Ongoing  Goal: Prepared for discharge  Outcome: Goal Ongoing     Problem: Health Maintenance - Impaired  Goal: Able to perfom ADL's  Outcome: Goal Ongoing  Goal: Adequate nutritional intake  Outcome: Goal Ongoing  Goal: Establish therapeutic relationships  Outcome: Goal Ongoing  Goal: Knowledge of health maintenance  Outcome: Goal Ongoing     Problem: Mood - Altered  Goal: Stabilize mood  Outcome: Goal Ongoing  Goal: Knowledge of Altered Mood  Outcome: Goal Ongoing  Patient is AO x 4.  Pt has been calm and cooperative. Pt compliant with HS medications. Pt out in the milieu, watching TV, and interacting with peers.  Pt stated he had a visit with his Dad today that went okay.      Problem: Self-esteem - Low  Goal: Demonstration of positive self-esteem  Outcome: Goal Ongoing  Goal: Knowledge of low self-esteem  Outcome: Goal Ongoing     Problem: Thought Process - Altered  Goal: Demonstration of organized thought processes  Outcome: Goal Ongoing  Goal: Knowledge of altered thought process  Outcome: Goal Ongoing     Problem: Violence, self/other-directed, Risk of  Goal: Absence of violence  Outcome: Goal Ongoing  Goal: Knowledge of risk for violence, self/other-directed  Outcome: Goal Ongoing     Problem: Harm to Self/Others, Risk of (Non-Psychiatric Patient)  Goal: Absence of Harm to Self/Others  Outcome: Goal Ongoing     Problem: Tobacco Use  Goal: Knowledge of tobacco-use cessation methods  Outcome: Goal Ongoing

## 2019-05-20 NOTE — Progress Notes
Case Management Progress Note    NAME:Jamie [name] MRN: [id number] DOB:10/21/1996 AGE: 23 y.o.  ADMISSION DATE: 05/05/2019 DAYS ADMITTED: LOS: 15 days     Date of Service: 05/20/2019      This Probation officer received a e-mail from [employer], CARE specialist with [place]. Reporting he received Level I CARE assessment.

## 2019-05-20 NOTE — Progress Notes
PSYCHIATRIC PROGRESS NOTE     LOS: 15 days  Voluntary/Involuntary Status: Voluntary  Guardian? Parents        MEDICATIONS   Current Psychotropic Medications:   1. Aripiprazole 30mg  PO daily (Aristada received 882mg  on 7/10)  2. Depakote ER 500mg  PO qHS  3. Folic acid 1mg  PO daily  4. Thiamine 100mg  PO daily  5. Melatonin 5mg  qhs      ASSESSMENT & DIAGNOSES   Jamie [name] is a 23 y.o. Caucasian male with a history of schizoaffective disorder who presented to [facility] ED with concerns of mania. This appears to be precipitated by medication noncompliance.  Factors that seem to have predisposed him to mental illness include genetics and substance use disorder. This current problem is maintained by substance use and medication noncompliance. However, protective factors include supportive family, intelligence and willingness to seek treatement. Proposed treatment has consisted of pharmacological management and individualized & group therapy.  ???  DSM-5 DIAGNOSES:  ??? Schizoaffective disorder, bipolar type, current episode manic, severe, with psychotic features           ??? Tobacco use disorder, mild  ??? Alcohol use disorder, severe     PLAN     ??? Per Gen Med recs, started lisinopril 2.5 qday, Coreg 6.25mg  BID, spironolactone 25mg  qday  ??? Depakote ER 500mg  qhs   ??? Received Aristada 05/10/19, will 21 day overlap of aripiprazole 30mg  PO daily  ??? Continue folic acid and thiamine supplements.  ??? Pending Level I screening results  ??? Encourage participation in milieu activities and therapies.    AIMS to be performed this admission     Metabolic labs completed on:  Lipid Panel:   LDL   Date Value Ref Range Status   05/06/2019 101 (H) <100 mg/dL Final     HDL   Date Value Ref Range Status   05/06/2019 46 >40 MG/DL Final     VLDL   Date Value Ref Range Status   05/06/2019 33 MG/DL Final     Cholesterol   Date Value Ref Range Status   05/06/2019 164 <200 MG/DL Final     Triglycerides   Date Value Ref Range Status 05/06/2019 165 (H) <150 MG/DL Final       Blood Glucose:  Hemoglobin A1C   Date Value Ref Range Status   05/06/2019 5.1 4.0 - 6.0 % Final     Comment:     The ADA recommends that most patients with type 1 and type 2 diabetes maintain   an A1c level <7%.         Dispositon: Maintain admission for safety and stabilization.  Will go to home upon discharge.    Seen and discussed with Dr. Shea Evans.    ______________________________________________________________     SUBJECTIVE     Per staff, he is calm and cooperative. Taking medications. Was visited by his father yesterday. Slept 6.0 hours.   Group attendance? Participating and attending.     Jamie [name] was seen in the dayroom this morning. He is feeling bored on the unit and asked about progress of level I screen. Patient stated he would like to be discharged before Thursday 2pm for a job interview in [place]. He believes having a job and staying off of drugs will decrease his chances of being readmitted. Patient enjoyed the visit from his father yesterday. Otherwise, no new complaints. Denied SI/HI/AH. Chronic VH of floaters and shapes present.      REVIEW OF SYSTEMS  Review of Systems   Respiratory: Negative for shortness of breath.    Cardiovascular: Negative for chest pain.   Gastrointestinal: Negative for abdominal pain, constipation, diarrhea and nausea.   Neurological: Negative for headaches.   Psychiatric/Behavioral: Positive for hallucinations. Negative for dysphoric mood, sleep disturbance and suicidal ideas.        OBJECTIVE                    Vital Signs:  Current                Vital Signs: 24 Hour Range   BP: 125/55 (07/20 0800)  Temp: 36.9 ???C (98.4 ???F) (07/20 0800)  Pulse: 54 (07/20 0800)  Respirations: 18 PER MINUTE (07/20 0800)  SpO2: 99 % (07/20 0800) BP: (125-144)/(55-85)   Temp:  [36.5 ???C (97.7 ???F)-36.9 ???C (98.4 ???F)]   Pulse:  [54-69]   Respirations:  [18 PER MINUTE-19 PER MINUTE]   SpO2:  [98 %-99 %] Intensity Pain Scale (Self Report): (not recorded)      Sleep:  Hours of Sleep: 6.0   Quality of Sleep: Resting Quietly    Scheduled Medications:  ARIPiprazole (ABILIFY) tablet 30 mg, 30 mg, Oral, QDAY  divalproex (DEPAKOTE ER) ER tablet 500 mg, 500 mg, Oral, QHS  folic acid (FOLVITE) tablet 1 mg, 1 mg, Oral, QDAY  melatonin tablet 5 mg, 5 mg, Oral, QHS  nicotine (NICODERM CQ STEP 3) 7 mg/day patch 1 patch, 1 patch, Transdermal, QDAY    And  Verification of Patch Placement and Integrity - Nicotine 7 MG/24HR, , Transdermal, BID  thiamine (VITAMIN B-1) tablet 100 mg, 100 mg, Oral, QDAY        PRN Medications:  acetaminophen Q6H PRN 650 mg at 05/18/19 1036, calcium carbonate BID PRN, docusate Q12H PRN, hydrOXYzine TID PRN 25 mg at 05/06/19 2041, nicotine polacrilex Q1H PRN 2 mg at 05/18/19 1918, OLANZapine Q6H PRN **OR** OLANZapine Q6H PRN, traZODone QHS PRN 50 mg at 05/19/19 2053    Mental Status Exam:  ??? General/Constitutional: 23 y.o. male, dressed in hospital attire, appears as stated age, well-groomed  ??? Behavior: Overall calm and cooperative   ??? Speech/Motor: Normal rate, rhythm, volume and tone.   ??? Eye Contact: Good  ??? Mood: bored  ??? Affect: Euthymic; mood congruent  ??? Thought Process: Linear and logical, goal directed  ??? Associations: Intact  ??? Thought Content: No suicidal or homicidal ideations  ??? Perception: No AH. Chronic VH.  ??? Insight/Judgment: Fair/Fair  ???  ??? Orientation: Clear sensorium   ??? Recent and Remote Memory: Intact  ??? Attention span and concentration: Fair  ??? Language: English, fluent  ??? Fund of knowledge and vocabulary: Above average     Focused Physical Exam:  ??? Musculoskeletal: moves all extremities spontaneously   ??? Neurological: no tics or tremors    ______________________________________________________________  Era Skeen, MD

## 2019-05-20 NOTE — Progress Notes
Case Management Progress Note    NAME:[name] MRN: [id number] DOB:18-Sep-1996 AGE: 23 y.o.  ADMISSION DATE: 05/05/2019 DAYS ADMITTED: LOS: 15 days     Date of Service: 05/20/2019  Service Start Time:  10:00am    CM called patients mom and gaurdian [name] [phone]. CM updated mom on where we are in the treatment plan with patient. Mom is stating that she still wants patient placed in a level ll facility. CM asked mom to send in proof of patient income for pharmacy.    CM will work on sending out referral packets today. According to [facility] patients do not require screens to get placement at this time due to [place].    CM called Comissioner Amy Penrod with [facility]. To get update on COVID exceptions. Amy will call back.    CM called [facility] in [place].[phone] Spoke with Juliann Pulse. They do have a male bed available. They say that he might be too young for their atmosphere. Juliann Pulse called back and said that they cannot meet the needs of the patient.     [place] [phone]. Left message for Salvadore Dom. Faxed over referral packet.           Sherlyn Lick, [facility]

## 2019-05-20 NOTE — Progress Notes
Chart check completed.

## 2019-05-21 NOTE — Progress Notes
Chart check completed.

## 2019-05-21 NOTE — Progress Notes
Chart check completed at this time

## 2019-05-21 NOTE — Care Plan
Pt is compliant with his morning meds and vital signs, tolerating breakfast well. Pt is calm and cooperative, denies pain, SI/HI/AH however still endorsing visual hallucination, pt reports seeing floater, dots and shadow at all times. Pt spent times in the milieu, TV room, attending groups, interacting well with peers and staffs, no violence behavior towards self or others. Will continue monitoring pt's safety and behavior.

## 2019-05-21 NOTE — Progress Notes
Case Management Progress Note    NAME:Jamie [name] MRN: [id number] DOB:Jul 24, 1996 AGE: 23 y.o.  ADMISSION DATE: 05/05/2019 DAYS ADMITTED: LOS: 16 days     Date of Service: 05/21/2019      This writer received an e-mailed from [place] requesting more paperwork. This Probation officer contacted patient's assigned case Freight forwarder. Brandy provided documentation to this Probation officer. This Probation officer  e-mailed [facility] with additional paperwork. Will continue to be in contact with [facility] regarding screen.

## 2019-05-21 NOTE — Care Plan
Pt compliant with HS medications. Pt has been calm and cooperative. Patient denies pain, SI/HI/AVH.  Patient is AO x 4.  Discharge planning ongoing. Pt out in the milieu, watching TV, and interacting with peers.       Problem: Discharge Planning  Goal: Participation in plan of care  Outcome: Goal Ongoing  Goal: Knowledge regarding plan of care  Outcome: Goal Ongoing  Goal: Prepared for discharge  Outcome: Goal Ongoing     Problem: Health Maintenance - Impaired  Goal: Establish therapeutic relationships  Outcome: Goal Ongoing  Goal: Knowledge of health maintenance  Outcome: Goal Ongoing     Problem: Mood - Altered  Goal: Stabilize mood  Outcome: Goal Ongoing  Goal: Knowledge of Altered Mood  Outcome: Goal Ongoing     Problem: Self-esteem - Low  Goal: Demonstration of positive self-esteem  Outcome: Goal Ongoing  Goal: Knowledge of low self-esteem  Outcome: Goal Ongoing     Problem: Thought Process - Altered  Goal: Demonstration of organized thought processes  Outcome: Goal Ongoing  Goal: Knowledge of altered thought process  Outcome: Goal Ongoing     Problem: Violence, self/other-directed, Risk of  Goal: Absence of violence  Outcome: Goal Ongoing  Goal: Knowledge of risk for violence, self/other-directed  Outcome: Goal Ongoing     Problem: Tobacco Use  Goal: Knowledge of tobacco-use cessation methods  Outcome: Goal Ongoing

## 2019-05-21 NOTE — Progress Notes
PSYCHIATRIC PROGRESS NOTE     LOS: 16 days  Voluntary/Involuntary Status: Voluntary  Guardian? Parents        MEDICATIONS   Current Psychotropic Medications:   1. Aripiprazole 30mg  PO daily (Aristada received 882mg  on 7/10)  2. Depakote ER 500mg  PO qHS  3. Folic acid 1mg  PO daily  4. Thiamine 100mg  PO daily  5. Melatonin 5mg  qhs      ASSESSMENT & DIAGNOSES   Jamie [name] is a 23 y.o. Caucasian male with a history of schizoaffective disorder who presented to [facility] ED with concerns of mania. This appears to be precipitated by medication noncompliance.  Factors that seem to have predisposed him to mental illness include genetics and substance use disorder. This current problem is maintained by substance use and medication noncompliance. However, protective factors include supportive family, intelligence and willingness to seek treatement. Proposed treatment has consisted of pharmacological management and individualized & group therapy.  ???  DSM-5 DIAGNOSES:  ??? Schizoaffective disorder, bipolar type, current episode manic, severe, with psychotic features           ??? Tobacco use disorder, mild  ??? Alcohol use disorder, severe     PLAN     ??? No changes made on 05/21/19  ??? Depakote ER 500mg  qhs   ??? Received Aristada 05/10/19, will 21 day overlap of aripiprazole 30mg  PO daily  ??? Continue folic acid and thiamine supplements  ??? Pending Level I screening results  ??? Continue lisinopril 2.5 qday, Coreg 6.25mg  BID, spironolactone 25mg  qday  ??? Encourage participation in milieu activities and therapies.    AIMS to be performed this admission     Metabolic labs completed on:  Lipid Panel:   LDL   Date Value Ref Range Status   05/06/2019 101 (H) <100 mg/dL Final     HDL   Date Value Ref Range Status   05/06/2019 46 >40 MG/DL Final     VLDL   Date Value Ref Range Status   05/06/2019 33 MG/DL Final     Cholesterol   Date Value Ref Range Status   05/06/2019 164 <200 MG/DL Final     Triglycerides   Date Value Ref Range Status 05/06/2019 165 (H) <150 MG/DL Final       Blood Glucose:  Hemoglobin A1C   Date Value Ref Range Status   05/06/2019 5.1 4.0 - 6.0 % Final     Comment:     The ADA recommends that most patients with type 1 and type 2 diabetes maintain   an A1c level <7%.         Dispositon: Maintain admission for safety and stabilization.  Will go to home upon discharge.    Seen and discussed with Dr. Shea Evans.    ______________________________________________________________     SUBJECTIVE     Per staff, he is calm and cooperative. Taking medications. Was visited by his father yesterday. Slept 6.0 hours.   Group attendance? Participating and attending.     Jamie [name] was seen this afternoon. He denied feeling sad/depressed, denied SI/HI/AH. Patient is awaiting KDADS screening results and continues to ask if he could be discharged for his job interview at [employer] in [facility].      REVIEW OF SYSTEMS   Review of Systems   Respiratory: Negative for shortness of breath.    Cardiovascular: Negative for chest pain.   Gastrointestinal: Negative for abdominal pain, constipation, diarrhea and nausea.   Neurological: Negative for headaches.   Psychiatric/Behavioral: Positive  for hallucinations. Negative for dysphoric mood, sleep disturbance and suicidal ideas.        OBJECTIVE                    Vital Signs:  Current                Vital Signs: 24 Hour Range   BP: 123/63 (07/21 0700)  Temp: 36.5 ???C (97.7 ???F) (07/21 0700)  Pulse: 60 (07/21 0700)  Respirations: 18 PER MINUTE (07/21 0700)  SpO2: 96 % (07/21 0700) BP: (109-123)/(63-75)   Temp:  [36.5 ???C (97.7 ???F)-37 ???C (98.6 ???F)]   Pulse:  [60-85]   Respirations:  [18 PER MINUTE]   SpO2:  [96 %-99 %]    Intensity Pain Scale (Self Report): (not recorded)      Sleep:  Hours of Sleep: 8   Quality of Sleep: Resting Quietly    Scheduled Medications:  ARIPiprazole (ABILIFY) tablet 30 mg, 30 mg, Oral, QDAY  carvediloL (COREG) tablet 6.25 mg, 6.25 mg, Oral, BID divalproex (DEPAKOTE ER) ER tablet 500 mg, 500 mg, Oral, QHS  folic acid (FOLVITE) tablet 1 mg, 1 mg, Oral, QDAY  lisinopriL (ZESTRIL) tablet 2.5 mg, 2.5 mg, Oral, QDAY  melatonin tablet 5 mg, 5 mg, Oral, QHS  nicotine (NICODERM CQ STEP 3) 7 mg/day patch 1 patch, 1 patch, Transdermal, QDAY    And  Verification of Patch Placement and Integrity - Nicotine 7 MG/24HR, , Transdermal, BID  spironolactone (ALDACTONE) tablet 25 mg, 25 mg, Oral, QDAY  thiamine (VITAMIN B-1) tablet 100 mg, 100 mg, Oral, QDAY        PRN Medications:  acetaminophen Q6H PRN 650 mg at 05/18/19 1036, calcium carbonate BID PRN, docusate Q12H PRN, hydrOXYzine TID PRN 25 mg at 05/06/19 2041, nicotine polacrilex Q1H PRN 2 mg at 05/21/19 1346, OLANZapine Q6H PRN **OR** OLANZapine Q6H PRN, traZODone QHS PRN 50 mg at 05/20/19 2102    Mental Status Exam:  ??? General/Constitutional: 22 y.o. male, dressed in hospital attire, appears as stated age, well-groomed  ??? Behavior: Overall calm and cooperative   ??? Speech/Motor: Normal rate, rhythm, volume and tone.   ??? Eye Contact: Good  ??? Mood: alright, here  ??? Affect: Euthymic; mood congruent  ??? Thought Process: Linear and logical, goal directed  ??? Associations: Intact  ??? Thought Content: No suicidal or homicidal ideations  ??? Perception: No AH. Chronic VH.  ??? Insight/Judgment: Fair/Fair  ???  ??? Orientation: Clear sensorium   ??? Recent and Remote Memory: Intact  ??? Attention span and concentration: Fair  ??? Language: English, fluent  ??? Fund of knowledge and vocabulary: Above average     Focused Physical Exam:  ??? Musculoskeletal: moves all extremities spontaneously   ??? Neurological: no tics or tremors    ______________________________________________________________  Era Skeen, MD

## 2019-05-22 NOTE — Progress Notes
TREATMENT TEAM NOTE  Name: Jamie [name]        MRN: [id number]          DOB: 1996-09-10          Age: 23 y.o.  Admission Date: 05/05/2019             LOS: 17 days      Attendees: UR: Lina Sar, RN  Nursing: Marylou Flesher, RN  Therapy: Yevonne Aline, LMLP, LCP  Therapy: Donovan Kail, LSCSW, [facility]  CM: Sherlyn Lick, [place]  CM: Wonda Cheng, LMSW  Clinical Manager: Sherrilee Gilles, LSCSW  Pharmacy: Rudene Christians, PharmD  Psychology: Linton Ham. Michiel Cowboy, PhD    Significant Points Discussed: med compliant, denies all psych, Level II application being sent out    Treatment Plan Discussion: continue to engage in treatment

## 2019-05-22 NOTE — Care Plan
Pt has been out in the milieu playing games with peers.Bright affect.Continues to see shadows and foreign objects.Denies ah/si/hi/paranoia.Calm,co-operative and med compliant.He agrees that his over drinking has resulted in his parents wanting him in a group home.He stated that he will just stop drinking all together because he wants to return to his apartment.Doesn't like that his parents have control over him.

## 2019-05-22 NOTE — Progress Notes
Service(s): Inpatient Group Psychotherapy      Patient data/presentation/complaint: Mr. Cambridge was seen in group therapy (1145 - 1230). He described his mood as pretty good and hopeful today. Noted to be attentive to group discussion. Was able to apply material to coping with emotions and gave examples of this. Also appeared to respond to peer socialization during the meeting. No other issues noted.       MSE: casually dressed and groomed, A+Ox3, mood, pretty good and hopeful, full range of affect, speech WNL, eye contact was WNL, TP linear and goal-directed, TC no overt (or reported) evidence of SI/HI/AH/VH, I fair-good, J fair-good, active participation in therapy      Psychosocial Intervention(s): Supportive therapy with a focus on behavior and mood was provided. Response was positive     Plan: Will continue to follow on an inpatient basis.     Provider:      Lendon Ka, Ph.D., [phone]

## 2019-05-22 NOTE — Progress Notes
TREATMENT TEAM NOTE  Name: Johnathyn Viscomi        MRN: [id number]          DOB: 1996-02-19          Age: 23 y.o.  Admission Date: 05/05/2019             LOS: 17 days        Attendees:   Psychiatry: Earle Gell. Damita Dunnings, DO  UR: Larose Hires, RN  Nursing:  Jennette Kettle  CM: Sherlyn Lick, LPC  CM: Wonda Cheng, LMSW  Therapy: Wetzel Bjornstad, LMLP, LCP  Therapy: Donovan Kail, LSCSW, [facility]  Music Therapy: Judie Grieve, [place]  Pharmacy: Rudene Christians, PharmD  Psychology: Linton Ham. Michiel Cowboy, PhD  Psychology Intern:  Blanchie Serve, [place]  Psychology Intern:  Anette Riedel, MA   Psychology Intern:  Redgie Grayer, MA     Significant Points Discussed:   Visible in the Milieu  Endorsed visual hallucinations consisting of shadows  Zoom meeting scheduled with a possible Level 2 placement facility  CM continues to reach out to Patient's mother for proof of income regarding possible long-term injection   Attend groups    Treatment Plan Discussion:   Continue treatment

## 2019-05-22 NOTE — Progress Notes
Chart check completed.

## 2019-05-22 NOTE — Care Plan
The patient is calm and cooperative. He is interactive in milieu and interactive with peers. No behavioral issues. He attends groups. CM made   He reported feeling upset with his parents. He feels they are sending him to a facility Patient because they do not care about him. He denied anxiety, depression, SI, RT, AVH, pain, trouble sleeping, and bad dreams.     Problem: Discharge Planning  Goal: Participation in plan of care  Outcome: Goal Ongoing  Goal: Knowledge regarding plan of care  Outcome: Goal Ongoing  Goal: Prepared for discharge  Outcome: Goal Ongoing     Problem: Health Maintenance - Impaired  Goal: Establish therapeutic relationships  Outcome: Goal Ongoing  Goal: Knowledge of health maintenance  Outcome: Goal Ongoing     Problem: Mood - Altered  Goal: Stabilize mood  Outcome: Goal Ongoing  Goal: Knowledge of Altered Mood  Outcome: Goal Ongoing     Problem: Self-esteem - Low  Goal: Demonstration of positive self-esteem  Outcome: Goal Ongoing  Goal: Knowledge of low self-esteem  Outcome: Goal Ongoing

## 2019-05-22 NOTE — Progress Notes
PSYCHIATRIC PROGRESS NOTE     LOS: 17 days  Voluntary/Involuntary Status: Voluntary  Guardian? Parents      MEDICATIONS   Current Psychotropic Medications:   1. Aripiprazole 30mg  PO daily (Aristada received 882mg  on 7/10)  2. Depakote ER 500mg  PO qHS  3. Folic acid 1mg  PO daily  4. Thiamine 100mg  PO daily  5. Melatonin 5mg  qhs      ASSESSMENT & DIAGNOSES   Jamie [name] is a 23 y.o. Caucasian male with a history of schizoaffective disorder who presented to [facility] ED with concerns of mania. This appears to be precipitated by medication noncompliance.  Factors that seem to have predisposed him to mental illness include genetics and substance use disorder. This current problem is maintained by substance use and medication noncompliance. However, protective factors include supportive family, intelligence and willingness to seek treatement. Proposed treatment has consisted of pharmacological management and individualized & group therapy.  ???  DSM-5 DIAGNOSES:  ??? Schizoaffective disorder, bipolar type, current episode manic, severe, with psychotic features           ??? Tobacco use disorder, mild  ??? Alcohol use disorder, severe     PLAN     ??? No changes made on 05/22/19  ??? Depakote ER 500mg  qhs   ??? Received Aristada 05/10/19, will 21 day overlap of aripiprazole 30mg  PO daily  ??? Continue folic acid and thiamine supplements  ??? Continue lisinopril 2.5 qday, Coreg 6.25mg  BID, spironolactone 25mg  qday  ??? Triggered level II based on history of hospitalizations and mental illness diagnosis; per CM he has been declined from various level II facilities.   ??? Encourage participation in milieu activities and therapies.    AIMS to be performed this admission     Metabolic labs completed on:  Lipid Panel:   LDL   Date Value Ref Range Status   05/06/2019 101 (H) <100 mg/dL Final     HDL   Date Value Ref Range Status   05/06/2019 46 >40 MG/DL Final     VLDL   Date Value Ref Range Status   05/06/2019 33 MG/DL Final     Cholesterol Date Value Ref Range Status   05/06/2019 164 <200 MG/DL Final     Triglycerides   Date Value Ref Range Status   05/06/2019 165 (H) <150 MG/DL Final       Blood Glucose:  Hemoglobin A1C   Date Value Ref Range Status   05/06/2019 5.1 4.0 - 6.0 % Final     Comment:     The ADA recommends that most patients with type 1 and type 2 diabetes maintain   an A1c level <7%.         Dispositon: Maintain admission for safety and stabilization.  Will go to home upon discharge.    Seen and discussed with Dr. Shea Evans.    ______________________________________________________________     SUBJECTIVE     Per staff, he is calm and cooperative. Taking medications. Spending time in milieu and interactive with peers and staff. No behavioral issues. Slept 6.0 hours. Group attendance? Participating and attending. CM made calls and faxed patient's information to level I facilities; no acceptance yet.     Jamie [name] was seen this morning eating breakfast alone. He reported feeling extremely upset with his parents because they are limiting his independence. Patient denied SI/HI/AH. + chronic visual disturbances.     Of note, this Clinical research associate spoke with the patient's mother, Jamie [name]. Discussed with mother plans for  discharge as patient has been psychiatrically stabilized on medications. He continues to await level II acceptances from facilities; however, this could be addressed outpatient. Mother will speak with her husband and contact providers.      REVIEW OF SYSTEMS   Review of Systems   Respiratory: Negative for shortness of breath.    Cardiovascular: Negative for chest pain.   Gastrointestinal: Negative for abdominal pain, constipation, diarrhea and nausea.   Neurological: Negative for headaches.   Psychiatric/Behavioral: Positive for hallucinations. Negative for dysphoric mood, sleep disturbance and suicidal ideas.      OBJECTIVE                    Vital Signs:  Current                Vital Signs: 24 Hour Range BP: 125/64 (07/22 0700)  Temp: 36.9 ???C (98.4 ???F) (07/22 0700)  Pulse: 79 (07/22 0700)  Respirations: 18 PER MINUTE (07/22 0700)  SpO2: 100 % (07/22 0700) BP: (125-131)/(64-68)   Temp:  [36.9 ???C (98.4 ???F)-37 ???C (98.6 ???F)]   Pulse:  [79-102]   Respirations:  [18 PER MINUTE]   SpO2:  [98 %-100 %]    Intensity Pain Scale (Self Report): (not recorded)      Sleep:  Hours of Sleep: 6   Quality of Sleep: Resting Quietly    Scheduled Medications:  ARIPiprazole (ABILIFY) tablet 30 mg, 30 mg, Oral, QDAY  carvediloL (COREG) tablet 6.25 mg, 6.25 mg, Oral, BID  divalproex (DEPAKOTE ER) ER tablet 500 mg, 500 mg, Oral, QHS  folic acid (FOLVITE) tablet 1 mg, 1 mg, Oral, QDAY  lisinopriL (ZESTRIL) tablet 2.5 mg, 2.5 mg, Oral, QDAY  melatonin tablet 5 mg, 5 mg, Oral, QHS  nicotine (NICODERM CQ STEP 3) 7 mg/day patch 1 patch, 1 patch, Transdermal, QDAY    And  Verification of Patch Placement and Integrity - Nicotine 7 MG/24HR, , Transdermal, BID  spironolactone (ALDACTONE) tablet 25 mg, 25 mg, Oral, QDAY  thiamine (VITAMIN B-1) tablet 100 mg, 100 mg, Oral, QDAY        PRN Medications:  acetaminophen Q6H PRN 650 mg at 05/18/19 1036, calcium carbonate BID PRN, docusate Q12H PRN, hydrOXYzine TID PRN 25 mg at 05/06/19 2041, nicotine polacrilex Q1H PRN 2 mg at 05/21/19 1819, OLANZapine Q6H PRN **OR** OLANZapine Q6H PRN, traZODone QHS PRN 50 mg at 05/21/19 2215    Mental Status Exam:  ??? General/Constitutional: 23 y.o. male, dressed in hospital attire, appears as stated age, well-groomed  ??? Behavior: Overall calm and cooperative   ??? Speech/Motor: Normal rate, rhythm, volume and tone.   ??? Eye Contact: Good  ??? Mood: okay  ??? Affect: frustrated     ??? Thought Process: Linear and logical, goal directed  ??? Associations: Intact  ??? Thought Content: No suicidal or homicidal ideations  ??? Perception: No AH. Chronic visual disturbances of shapes/dots.  ??? Insight/Judgment: Fair/Fair  ???  ??? Orientation: Clear sensorium   ??? Recent and Remote Memory: Intact ??? Attention span and concentration: Fair  ??? Language: English, fluent  ??? Fund of knowledge and vocabulary: Above average     Focused Physical Exam:  ??? Musculoskeletal: moves all extremities spontaneously   ??? Neurological: no tics or tremors    ______________________________________________________________  Era Skeen, MD

## 2019-05-22 NOTE — Progress Notes
Case Management Progress Note    NAME:Jamie [name] MRN: [id number] DOB:08-06-1996 AGE: 23 y.o.  ADMISSION DATE: 05/05/2019 DAYS ADMITTED: LOS: 17 days     Date of Service: 05/22/2019  Service Start Time:  9:00am    Patient has a zoom meeting scheduled for today at 10:30am with Salvadore Dom at [facility].     CM called mom and informed her of the meeting. Mom agreed to send over proof of income documents.       CM met with patient to inform him that he had a meeting for placement today. He looked surprised and went quiet.       CM and patient went to conference room and completed the zoom meeting with Jamie [name]. Patient conducted himself appropriately. He asked relevant questions such as what the average age of the patients is, and what kind of activities they will be able to offer a person his age.       [facility] declined patients admission due to his mother wanting a locked facility, (they are not locked), and they said that they do not believe that the would be able to meet his needs due to his young age and abilities.       Sherlyn Lick, [facility]

## 2019-05-22 NOTE — Med Student Progress Note
Medical Student Progress Note -  Inpatient    NAME: Jamie [name]                                     MRN: [id number]                 DOB: 1996-03-22          AGE: 23 y.o.  ADMISSION DATE: 05/05/2019             DAYS ADMITTED: LOS: 17 days        PSYCHIATRIC PROGRESS NOTE     LOS: 17 days  Voluntary/Involuntary Status: Voluntary  Guardian? Gu, Jamie [name]       MEDICATIONS   Current Psychotropic Medications:   1. Aripiprazole tablet 30mg  PO daily  2. Divalproex ER tablet 500mg  PO QHS  3. Trazodone tablet 100mg  PO QHS  4. Aripiprazole lauroxil injection 882mg  IM Q30 days, administered 05/10/2019       ASSESSMENT & DIAGNOSES   Jamie [name] is a 23 y.o. Caucasian male with a history of schizoaffective disorder, bipolar type who presented to [facility] ED with concerns of mania. This appears to be precipitated by medication noncompliance.  Factors that seem to have predisposed him to mental illness include genetics and substance use disorder.  This current problem is maintained by substance use and medication noncompliance. However, protective factors include supportive family, intelligence, and willingness to seek treatment. Proposed treatment has consisted of pharmacological management and individualized & group therapy.    DSM-5 DIAGNOSES:  1. Schizoaffective disorder, bipolar type, current episode manic, severe, with psychotic features  2. Tobacco use disorder, mild  3. Alcohol use disorder, severe       PLAN     ? No indication for psychotropic medication changes on 05/22/19.  o Continue divalproex ER 500mg  QHS  o Received LAI Aristada on 05/10/2019 + 21 day overlap of aripiprazole 30mg  PO daily (until 7/31)  o Continue folic acid and thiamine  o Pending Level 1 screening results  o Per internal medicine, continue carvedilol 6.25mg  BID, lisinopril 2.5mg  daily, spironolactone 25mg  daily for HFrEF    AIMS to be performed prior to discharge.    Metabolic labs completed on:  Lipid Panel:   LDL Date Value Ref Range Status   05/06/2019 101 (H) <100 mg/dL Final     HDL   Date Value Ref Range Status   05/06/2019 46 >40 MG/DL Final     VLDL   Date Value Ref Range Status   05/06/2019 33 MG/DL Final     Cholesterol   Date Value Ref Range Status   05/06/2019 164 <200 MG/DL Final     Triglycerides   Date Value Ref Range Status   05/06/2019 165 (H) <150 MG/DL Final       Blood Glucose:  Hemoglobin A1C   Date Value Ref Range Status   05/06/2019 5.1 4.0 - 6.0 % Final     Comment:     The ADA recommends that most patients with type 1 and type 2 diabetes maintain   an A1c level <7%.         Disposition: Maintain admission for safety and stabilization.  Will go to home upon discharge.    ____________________________________________________________     SUBJECTIVE     No behavioral issues reported. Jamie [name] is taking all medications, participating in groups, and is calm and cooperative on the  unit. He slept 6 hours last night, reported restful sleep. He is consistently eating 100% of his meals.    Jamie [name] was seen on the unit after breakfast this morning. He reports anger and frustration at his parents' insistence that he stay at [facility] until discharged to a level I facility. He states that they don't understand what he needs and maybe I shouldn't have told them everything. He restarted his medications for heart failure yesterday and reports that he doesn't mind taking them but probably will not continue to take them outpatient because my heart feels fine and they are expensive.       REVIEW OF SYSTEMS   Review of Systems   Respiratory: Negative for cough and shortness of breath.    Cardiovascular: Negative for chest pain.   Gastrointestinal: Negative for abdominal pain, constipation, diarrhea, nausea and vomiting.   Neurological: Negative for tremors and headaches.   Psychiatric/Behavioral: Positive for dysphoric mood and hallucinations.          OBJECTIVE Vital Signs:  Current                Vital Signs: 24 Hour Range   BP: 125/64 (07/22 0700)  Temp: 36.9 ???C (98.4 ???F) (07/22 0700)  Pulse: 79 (07/22 0700)  Respirations: 18 PER MINUTE (07/22 0700)  SpO2: 100 % (07/22 0700) BP: (125-131)/(64-68)   Temp:  [36.9 ???C (98.4 ???F)-37 ???C (98.6 ???F)]   Pulse:  [79-102]   Respirations:  [18 PER MINUTE]   SpO2:  [98 %-100 %]    Intensity Pain Scale (Self Report): (not recorded)      Sleep:  Hours of Sleep: 6   Quality of Sleep: Resting Quietly    Scheduled Medications:  ARIPiprazole (ABILIFY) tablet 30 mg, 30 mg, Oral, QDAY  carvediloL (COREG) tablet 6.25 mg, 6.25 mg, Oral, BID  divalproex (DEPAKOTE ER) ER tablet 500 mg, 500 mg, Oral, QHS  folic acid (FOLVITE) tablet 1 mg, 1 mg, Oral, QDAY  lisinopriL (ZESTRIL) tablet 2.5 mg, 2.5 mg, Oral, QDAY  melatonin tablet 5 mg, 5 mg, Oral, QHS  nicotine (NICODERM CQ STEP 3) 7 mg/day patch 1 patch, 1 patch, Transdermal, QDAY    And  Verification of Patch Placement and Integrity - Nicotine 7 MG/24HR, , Transdermal, BID  spironolactone (ALDACTONE) tablet 25 mg, 25 mg, Oral, QDAY  thiamine (VITAMIN B-1) tablet 100 mg, 100 mg, Oral, QDAY        PRN Medications:  acetaminophen Q6H PRN 650 mg at 05/18/19 1036, calcium carbonate BID PRN, docusate Q12H PRN, hydrOXYzine TID PRN 25 mg at 05/06/19 2041, nicotine polacrilex Q1H PRN 2 mg at 05/21/19 1819, OLANZapine Q6H PRN **OR** OLANZapine Q6H PRN, traZODone QHS PRN 50 mg at 05/21/19 2215    Mental Status Exam:  ??? General/Constitutional: 23 y.o. male, appears stated age, wearing hospital attire, appropriate grooming  ??? Behavior: Overall calm and cooperative  ??? Speech/Motor: normal rate, rhythm, amount and volume; talkative. No abnormal movements.  ??? Eye Contact: good  ??? Mood: mad and depressed, but okay  ??? Affect: euthymic, mood incongruent  ??? Thought Process: linear  ??? Associations: intact  ??? Thought Content: no delusions, no SI/HI ??? Perception: no auditory hallucinations, reported visual floaters and halos (same as before)  ??? Insight/Judgment: limited/fair    ??? Orientation: A&Ox4  ??? Recent and Remote Memory: intact  ??? Attention span and concentration: normal  ??? Language: fluent [place] speaker  ??? Fund of knowledge and vocabulary: average/above average  ______________________________________________________________  Sinclair Ship, MS3

## 2019-05-23 ENCOUNTER — Ambulatory Visit
Admit: 2019-05-05 | Discharge: 2019-05-23 | Disposition: A | Discharge status: Home / Self Care | Attending: Psychiatry | Admitting: Psychiatry

## 2019-05-23 ENCOUNTER — Ambulatory Visit: Admit: 2019-05-02 | Discharge: 2019-05-02

## 2019-05-23 DIAGNOSIS — Z9114 Patient's other noncompliance with medication regimen: Secondary | ICD-10-CM

## 2019-05-23 DIAGNOSIS — I5042 Chronic combined systolic (congestive) and diastolic (congestive) heart failure: Secondary | ICD-10-CM

## 2019-05-23 DIAGNOSIS — F1020 Alcohol dependence, uncomplicated: Secondary | ICD-10-CM

## 2019-05-23 DIAGNOSIS — R6884 Jaw pain: Secondary | ICD-10-CM

## 2019-05-23 DIAGNOSIS — R51 Headache: Secondary | ICD-10-CM

## 2019-05-23 DIAGNOSIS — E785 Hyperlipidemia, unspecified: Secondary | ICD-10-CM

## 2019-05-23 DIAGNOSIS — Z1159 Encounter for screening for other viral diseases: Secondary | ICD-10-CM

## 2019-05-23 DIAGNOSIS — I509 Heart failure, unspecified: Secondary | ICD-10-CM

## 2019-05-23 DIAGNOSIS — F17200 Nicotine dependence, unspecified, uncomplicated: Secondary | ICD-10-CM

## 2019-05-23 DIAGNOSIS — I110 Hypertensive heart disease with heart failure: Secondary | ICD-10-CM

## 2019-05-23 DIAGNOSIS — F1290 Cannabis use, unspecified, uncomplicated: Secondary | ICD-10-CM

## 2019-05-23 DIAGNOSIS — S01511D Laceration without foreign body of lip, subsequent encounter: Secondary | ICD-10-CM

## 2019-05-23 DIAGNOSIS — R001 Bradycardia, unspecified: Secondary | ICD-10-CM

## 2019-05-23 DIAGNOSIS — Z915 Personal history of self-harm: Secondary | ICD-10-CM

## 2019-05-23 DIAGNOSIS — F250 Schizoaffective disorder, bipolar type: Principal | ICD-10-CM

## 2019-05-23 MED ORDER — CARVEDILOL 6.25 MG PO TAB
6.25 mg | ORAL_TABLET | Freq: Two times a day (BID) | ORAL | 0 refills | 90.00000 days | Status: DC
Start: 2019-05-23 — End: 2019-12-04

## 2019-05-23 MED ORDER — SPIRONOLACTONE 25 MG PO TAB
25 mg | ORAL_TABLET | Freq: Every day | ORAL | 0 refills | 90.00000 days | Status: DC
Start: 2019-05-23 — End: 2019-12-04

## 2019-05-23 MED ORDER — DIVALPROEX 500 MG PO TB24
500 mg | ORAL_TABLET | Freq: Every evening | ORAL | 0 refills | 30.00000 days | Status: DC
Start: 2019-05-23 — End: 2019-06-24

## 2019-05-23 MED ORDER — ARIPIPRAZOLE 30 MG PO TAB
30 mg | ORAL_TABLET | Freq: Every day | ORAL | 0 refills | Status: AC
Start: 2019-05-23 — End: ?

## 2019-05-23 MED ORDER — ARIPIPRAZOLE LAUROXIL 882 MG/3.2 ML IM SERS
882 mg | INTRAMUSCULAR | 0 refills | Status: DC
Start: 2019-05-23 — End: 2019-05-31

## 2019-05-23 MED ORDER — LISINOPRIL 2.5 MG PO TAB
2.5 mg | ORAL_TABLET | Freq: Every day | ORAL | 0 refills | Status: DC
Start: 2019-05-23 — End: 2019-12-04

## 2019-05-23 NOTE — Progress Notes
PSYCHIATRIC PROGRESS NOTE     LOS: 18 days  Voluntary/Involuntary Status: Voluntary  Guardian? Parents      MEDICATIONS   Current Psychotropic Medications:   1. Aripiprazole 30mg  PO daily (Aristada received 882mg  on 7/10)  2. Depakote ER 500mg  PO qHS  3. Folic acid 1mg  PO daily  4. Thiamine 100mg  PO daily  5. Melatonin 5mg  qhs      ASSESSMENT & DIAGNOSES   Jamie [name] is a 23 y.o. Caucasian male with a history of schizoaffective disorder who presented to [facility] ED with concerns of mania. This appears to be precipitated by medication noncompliance.  Factors that seem to have predisposed him to mental illness include genetics and substance use disorder. This current problem is maintained by substance use and medication noncompliance. However, protective factors include supportive family, intelligence and willingness to seek treatement. Proposed treatment has consisted of pharmacological management and individualized & group therapy.  ???  DSM-5 DIAGNOSES:  ??? Schizoaffective disorder, bipolar type, current episode manic, severe, with psychotic features           ??? Tobacco use disorder, mild  ??? Alcohol use disorder, severe     PLAN     ??? No changes made on 05/23/19  ??? Depakote ER 500mg  qhs   ??? Received Aristada 05/10/19, will 21 day overlap of aripiprazole 30mg  PO daily  ??? Continue folic acid and thiamine supplements  ??? Continue lisinopril 2.5 qday, Coreg 6.25mg  BID, spironolactone 25mg  qday  ??? Triggered level II based on history of hospitalizations and mental illness diagnosis; per CM he has been declined from various level II facilities.   ??? Encourage participation in milieu activities and therapies.    AIMS to be performed this admission     Metabolic labs completed on:  Lipid Panel:   LDL   Date Value Ref Range Status   05/06/2019 101 (H) <100 mg/dL Final     HDL   Date Value Ref Range Status   05/06/2019 46 >40 MG/DL Final     VLDL   Date Value Ref Range Status   05/06/2019 33 MG/DL Final     Cholesterol Date Value Ref Range Status   05/06/2019 164 <200 MG/DL Final     Triglycerides   Date Value Ref Range Status   05/06/2019 165 (H) <150 MG/DL Final       Blood Glucose:  Hemoglobin A1C   Date Value Ref Range Status   05/06/2019 5.1 4.0 - 6.0 % Final     Comment:     The ADA recommends that most patients with type 1 and type 2 diabetes maintain   an A1c level <7%.         Dispositon: Maintain admission for safety and stabilization.  Will go to home upon discharge.    Seen and discussed with Dr. Shea Evans.    ______________________________________________________________     SUBJECTIVE     Per staff, he is calm and cooperative. Taking medications. Spending time in milieu and interactive with peers and staff. No behavioral issues. Slept 7.5 hours. Group attendance? Participating and attending.      [name] was seen for follow up. He feels excited about possible discharge and being denied from several Level II facilities. He would like to leave today for his job interview. He was encouraged to postpone this interview as his parents will need to be contacted first. Patient is stable, denied depressive and anxiety symptoms and denied SI/HI/AH.  REVIEW OF SYSTEMS   Review of Systems   Respiratory: Negative for shortness of breath.    Cardiovascular: Negative for chest pain.   Gastrointestinal: Negative for abdominal pain, constipation, diarrhea and nausea.   Neurological: Negative for headaches.   Psychiatric/Behavioral: Positive for hallucinations. Negative for dysphoric mood, sleep disturbance and suicidal ideas.      OBJECTIVE                    Vital Signs:  Current                Vital Signs: 24 Hour Range   BP: 115/69 (07/22 1900)  Temp: 36.7 ???C (98.1 ???F) (07/22 1900)  Pulse: 83 (07/22 1900)  Respirations: 17 PER MINUTE (07/22 1900)  SpO2: 99 % (07/22 1900) BP: (115)/(69)   Temp:  [36.7 ???C (98.1 ???F)]   Pulse:  [83]   Respirations:  [17 PER MINUTE]   SpO2:  [99 %] Intensity Pain Scale (Self Report): (not recorded)      Sleep:  Hours of Sleep: 7.50   Quality of Sleep: Resting Quietly    Scheduled Medications:  ARIPiprazole (ABILIFY) tablet 30 mg, 30 mg, Oral, QDAY  carvediloL (COREG) tablet 6.25 mg, 6.25 mg, Oral, BID  divalproex (DEPAKOTE ER) ER tablet 500 mg, 500 mg, Oral, QHS  folic acid (FOLVITE) tablet 1 mg, 1 mg, Oral, QDAY  lisinopriL (ZESTRIL) tablet 2.5 mg, 2.5 mg, Oral, QDAY  melatonin tablet 5 mg, 5 mg, Oral, QHS  nicotine (NICODERM CQ STEP 3) 7 mg/day patch 1 patch, 1 patch, Transdermal, QDAY    And  Verification of Patch Placement and Integrity - Nicotine 7 MG/24HR, , Transdermal, BID  spironolactone (ALDACTONE) tablet 25 mg, 25 mg, Oral, QDAY  thiamine (VITAMIN B-1) tablet 100 mg, 100 mg, Oral, QDAY        PRN Medications:  acetaminophen Q6H PRN 650 mg at 05/18/19 1036, calcium carbonate BID PRN, docusate Q12H PRN, hydrOXYzine TID PRN 25 mg at 05/06/19 2041, nicotine polacrilex Q1H PRN 2 mg at 05/22/19 2048, OLANZapine Q6H PRN **OR** OLANZapine Q6H PRN, traZODone QHS PRN 50 mg at 05/22/19 2048    Mental Status Exam:  ??? General/Constitutional: 23 y.o. male, dressed in hospital attire, appears as stated age, well-groomed  ??? Behavior: Overall calm and cooperative   ??? Speech/Motor: Normal rate, rhythm, volume and tone.   ??? Eye Contact: Good  ??? Mood: great  ??? Affect: cheerful, full range, mood congruent   ??? Thought Process: Linear and logical, goal directed  ??? Associations: Intact  ??? Thought Content: No suicidal or homicidal ideations  ??? Perception: No AH. Chronic visual disturbances of shapes/dots.  ??? Insight/Judgment: Fair/Fair  ???  ??? Orientation: Clear sensorium   ??? Recent and Remote Memory: Intact  ??? Attention span and concentration: Fair  ??? Language: English, fluent  ??? Fund of knowledge and vocabulary: Above average     Focused Physical Exam:  ??? Musculoskeletal: moves all extremities spontaneously   ??? Neurological: no tics or tremors ______________________________________________________________  Era Skeen, MD

## 2019-05-23 NOTE — Care Plan
Problem: Discharge Planning  Goal: Participation in plan of care  Outcome: Goal Achieved  Goal: Knowledge regarding plan of care  Outcome: Goal Achieved  Goal: Prepared for discharge  Outcome: Goal Achieved     Problem: Health Maintenance - Impaired  Goal: Establish therapeutic relationships  Outcome: Goal Achieved  Goal: Knowledge of health maintenance  Outcome: Goal Achieved     Problem: Mood - Altered  Goal: Stabilize mood  Outcome: Goal Achieved  Goal: Knowledge of Altered Mood  Outcome: Goal Achieved     Problem: Self-esteem - Low  Goal: Demonstration of positive self-esteem  Outcome: Goal Achieved  Goal: Knowledge of low self-esteem  Outcome: Goal Achieved

## 2019-05-23 NOTE — Care Plan
Pt woke up this morning on a bright affect.Had breakfast and went back to rest in bed.Med compliant.Encouraged to go to groups which he did. Continues to see dots,little lights of different colors.Calm and co-operative.Denies si/hi/ah.Can't wait to leave/discharge.

## 2019-05-23 NOTE — Progress Notes
[facility]  Therapy Progress Note    NAME:Jamie [name] MRN: [id number] DOB:21-Feb-1996 AGE: 23 y.o.  ADMISSION DATE: 05/05/2019 DAYS ADMITTED: LOS: 18 days    Date of Service:  05/23/19    Active Problems:    Schizoaffective disorder, bipolar type ([place])    Objective:  Therapist briefly spoke to Patient in the Milieu.  He declined the opportunity for a therapy session.  Eye contact WNL.  Affect full; mood positive.  Patient denied any significant issues/concerns.    Narrative:  Therapist complimented Patient for being active in the Milieu and attending groups.  He was reminded that he can meet with a Therapist when he feels a session would be beneficial.       Follow up:  Treatment Team will continue to follow.

## 2019-05-23 NOTE — Progress Notes
A medication education group was performed today, and Jamie [name] attended the entire duration of the group.  They frequently participated in group and demonstrated good understanding of material discussed.    Today we played medication B-I-N-G-O!  Common psychiatric medications including antidepressants (SSRIs, Cymbalta), atypical/2nd gen. antipsychotics (Abilify), mood stabilizers (Lithium, Valproic Acid), benzodiazepines (clonazepam, lorazepam),  treatments for opioid-use-disorder (Naltrexone tabs and ER injection), Naloxone, Prazosin were reviewed for indications, side effects, and onset of effect.      Jamie [name] asked: "What is the threshold for alcoholism?" Also, he engaged in a brief discussion with another group member about Depakote.     He was very pleasant and had a positive attitude towards medications in group today.    Will discuss with team in rounds.      Group Leader:  Allyson Sabal

## 2019-05-23 NOTE — Discharge Instructions
Case Management Discharge Instructions:  Follow up with scheduled appointments as recommended.  -Take medications as prescribed  -Follow up with medication management as recommended.  -Utilize individual and group therapy as needed.  -Continue using daily coping skills.   -In case of crisis contact 911 or nearest hospital emergency room    Appointment Information:  May 27, 2019 3:00PM  Telehealth With Shane Crutch, PhD  [facility]    Other Follow Up Information:  Here are some resources close in your area that you could learn more about.     [facility]  [phone]  [address]  [place]  [address]  Community Support Services (CSS)    [facility] Community Support Services (CSS) program provides community-based service to adults with Serious and Persistant Mental Illness (SPMI). The primary goal of these services is to assist clients with maintaining community living. Services include case management, peer support, assistance through the PATH program for those adults who qualify, Adult Psychosocial treatment (individual or group), targeted services for those with Dual Diagnosis (mental illness and substance abuse), psychiatry service, and other mental health services as necessary.      [facility] 24/7 Crisis Call Line  [phone]  [facility] provides clinical, educational and prevention services to individuals and families in [place] impacted by substance use disorders. Our prevention and recovery services impact over 60,000 individuals each year in the [facility] area.    Assessments - [facility] offers confidential one-on-one evaluation for adults and adolescents with certified substance use professionals and referrals to appropriate care.    ?Recovery Advocacy - [facility]???s Recovery Advocates provide clinical support and guidance for individuals at any stage of recovery.           NATIONAL SUICIDE PREVENTION LIFELINE: 1-800-SUICIDE (1.670-759-6665 or 1.936-388-3204 for deaf & hard of hearing available 24 hours/7days/week)     If a crisis develops, and/or I want to harm myself or others, and using the above safety plan is NOT EFFECTIVE, I agree to immediately do the following:      1.          Either remove myself from the presence of weapons, medications, or other means of harming myself, or ask a trusted friend/family member to remove such things from my home.   2.          Call 911 or go the nearest hospital emergency room.  National Suicide Prevention Lifeline: 813-324-7084.    We can all help prevent suicide. The Lifeline provides 24/7, free and confidential support for people in distress, prevention and crisis resources for you or your loved ones, and best practices for professionals.     Crisis Text Line: Text HELLO or HOME to 612-682-3820  Crisis Text Line serves anyone, in any type of crisis, providing access to free, 24/7 support and information via a medium people already use and trust: text.

## 2019-05-23 NOTE — Discharge Planning (AHS/AVS)
Case Management Discharge Instructions:  Follow up with scheduled appointments as recommended.  -Take medications as prescribed  -Follow up with medication management as recommended.  -Utilize individual and group therapy as needed.  -Continue using daily coping skills.   -In case of crisis contact 911 or nearest hospital emergency room  ???  Appointment Information:  May 27, 2019 3:00PM  Telehealth With Shane Crutch, PhD  [facility]  ???  Other Follow Up Information:  Here are some resources close in your area that you could learn more about.   ???  [facility]  [phone]  [address]  [place]  [address]  Community Support Services (CSS)  ???  The [employer] (CSS) program provides community-based service to adults with Serious and Persistant Mental Illness (SPMI). The primary goal of these services is to assist clients with maintaining community living. Services include case management, peer support, assistance through the PATH program for those adults who qualify, Adult Psychosocial treatment (individual or group), targeted services for those with Dual Diagnosis (mental illness and substance abuse), psychiatry service, and other mental health services as necessary.  ???  ???  [facility] 24/7 Crisis Call Line  [phone]  [facility] provides clinical, educational and prevention services to individuals and families in [place] impacted by substance use disorders. Our prevention and recovery services impact over 60,000 individuals each year in the [facility] area.  ???  Assessments - [facility] offers confidential one-on-one evaluation for adults and adolescents with certified substance use professionals and referrals to appropriate care.  ???  ?Recovery Advocacy - [facility]???s Recovery Advocates provide clinical support and guidance for individuals at any stage of recovery.     ???  ???  ??? NATIONAL SUICIDE PREVENTION LIFELINE: 1-800-SUICIDE (1.808-772-1617 or 1.308-589-3816 for deaf & hard of hearing available 24 hours/7days/week)  ???  If a crisis develops, and/or I want to harm myself or others, and using the above safety plan is NOT EFFECTIVE, I agree to immediately do the following:   ???  1.??????????????????????????????Either remove myself from the presence of weapons, medications, or other means of harming myself, or ask a trusted friend/family member to remove such things from my home.   2.??????????????????????????????Call 911???or go the nearest hospital emergency room.  National Suicide Prevention Lifeline: 989-497-8466.??????  We can all help prevent suicide. The Lifeline provides 24/7, free and confidential support for people in distress, prevention and crisis resources for you or your loved ones, and best practices for professionals.  ???  Crisis???Text Line: Text HELLO or HOME to (734)645-6128  Crisis Text Line serves anyone, in any type of crisis, providing access to free, 24/7 support and information via a medium people already use and trust: text.

## 2019-05-23 NOTE — Progress Notes
Safety Plan   Name: Jamie [name]          MRN: [id number]              DOB: Jul 14, 1996        Age: 23 y.o.  Admission Date: 05/05/2019             Step 1: WHAT I LOOK FORWARD TO IN MY FUTURE are:    (future accomplishments)    Sleeping in my own bed. Demonstrating that I am serious about making improvements in my decision making and managing my mental health symptoms.     Step 2: What are my protective factors? (loved ones, pets, things you look forward to, faith, job, supportive people, fear of death)     Loved ones  Interested in finding employment  Has things to look forward to      Step 3: What are TRIGGERS that I can avoid that may result in thoughts of harm to myself or others? (situations, people, behaviors, places, substance)  1. Drug use.   2. Strained family relationships  3. Rejection    Step 4: What are my internal WARNING SIGNS of distress? (thoughts, images, moods, memories)  1. Won't sleep  2. Manic  3. I call people out of the blue.     Step 5: INTERNAL COPING STRATEGIES ??? What can I do on my own to help prevent myself from acting out on my thoughts to harm myself or others?     1. Listen to music  2. Play video game  3. Talk to friends  4. Play pickle ball.     Step 6: How can I attempt to have safer surroundings in my environment to prevent a crisis?  1. Take medications as prescribed by your doctors.   2. Attend all mental health and physical health appointments   3. Look for warning signs within yourself and reach out for help when needed.     Step 7: Among my supports, whom can I reach out to for help during a crisis:   Friend/Family I can call:   Diane                          Phone:   Psychologist:                      Dr. Lowry Bowl                       Phone:  Psychiatrist/Physician:                               Phone:   Spiritual support (priest, pastor, rabbi etc..)             Phone:   Case manager:                                                                           Phone: [employer]: 1-800-SUICIDE (548-231-2795 or 1.719-311-6386 for deaf & hard of hearing available 24 hours/7days/week) Or text (519)154-2706  If a crisis develops, and/or I want to  harm myself or others, and using the above safety plan is NOT EFFECTIVE, I agree to immediately do the following:     1. Either remove myself from the presence of weapons, medications, or other means of harming myself, or ask a trusted friend/family member to remove such things from my home.   2. Call 911 or go the nearest hospital emergency room.    Additional Contacts:   The Veterans Suicide Hotline (Veterans Crisis Line): 509-771-8410, press 1 or text to 442-637-5300 (available 24 hours a day, seven days a week):  o [employer] & online chat: https://sampson.info/  o Lesbian, Gay, Bisexual, Transgender and Questioning (LGBTQ) Suicide Hotline (the [employer]):   (712) 723-8426 available 24 hours a day, seven days a week)  o [employer] online chat: DyeTool.be (Available 7 days a week (3:00 p.m. - 9:00 p.m. ET / 12:00 p.m. - 6:00 p.m. PT) Text the word Beryle Beams to 1-602-288-0499 (Available on Fridays (4:00 p.m. - 8:00 p.m. ET / 1:00 p.m. - 5:00 p.m. PT)   o [employer] National Helpline - 1-800-662-HELP (984)105-4303)  o The Disaster Distress Helpline (817) 149-8339 or text TalkWithUs to (916)413-6111  o Sexual assault hotline: 1.662-400-2295 available 24/7  o Domestic Violence hotline: 1-800-799-SAFE 808 067 6500) available 24/7.

## 2019-05-23 NOTE — Med Student Progress Note
Medical Student Progress Note -  Inpatient    NAME: Jamie [name]                                     MRN: [id number]                 DOB: February 25, 1996          AGE: 23 y.o.  ADMISSION DATE: 05/05/2019             DAYS ADMITTED: LOS: 18 days        PSYCHIATRIC PROGRESS NOTE     LOS: 18 days  Voluntary/Involuntary Status: Voluntary  Guardian? Bowditch, [name]       MEDICATIONS   Current Psychotropic Medications:   1. Aripiprazole tablet 30mg  PO daily  2. Divalproex ER tablet 500mg  PO QHS  3. Trazodone tablet 100mg  PO QHS  4. Aripiprazole lauroxil injection 882mg  IM Q30 days, administered 05/10/2019       ASSESSMENT & DIAGNOSES   Jamie [name] is a 23 y.o. Caucasian male with a history of schizoaffective disorder, bipolar type who presented to [facility] ED with concerns of mania. This appears to be precipitated by medication noncompliance.  Factors that seem to have predisposed him to mental illness include genetics and substance use disorder.  This current problem is maintained by substance use and medication noncompliance. However, protective factors include supportive family, intelligence, and willingness to seek treatment. Proposed treatment has consisted of pharmacological management and individualized & group therapy.    DSM-5 DIAGNOSES:  1. Schizoaffective disorder, bipolar type, current episode manic, severe, with psychotic features  2. Tobacco use disorder, mild  3. Alcohol use disorder, severe       PLAN     ? No indication for psychotropic medication changes on 05/23/19.  o Continue divalproex ER 500mg  QHS  o Received LAI Aristada on 05/10/2019 + 21 day overlap of aripiprazole 30mg  PO daily (until 7/31)  o Continue folic acid and thiamine  o Level I screen triggered Level II screen, CM working to find placement  o Per internal medicine, continue carvedilol 6.25mg  BID, lisinopril 2.5mg  daily, spironolactone 25mg  daily for HFrEF  o Discharge awaiting communication with mother AIMS to be performed prior to discharge.    Metabolic labs completed on:  Lipid Panel:   LDL   Date Value Ref Range Status   05/06/2019 101 (H) <100 mg/dL Final     HDL   Date Value Ref Range Status   05/06/2019 46 >40 MG/DL Final     VLDL   Date Value Ref Range Status   05/06/2019 33 MG/DL Final     Cholesterol   Date Value Ref Range Status   05/06/2019 164 <200 MG/DL Final     Triglycerides   Date Value Ref Range Status   05/06/2019 165 (H) <150 MG/DL Final       Blood Glucose:  Hemoglobin A1C   Date Value Ref Range Status   05/06/2019 5.1 4.0 - 6.0 % Final     Comment:     The ADA recommends that most patients with type 1 and type 2 diabetes maintain   an A1c level <7%.         Disposition: Maintain admission for safety and stabilization.  Will go to home upon discharge.    ____________________________________________________________     SUBJECTIVE     No behavioral issues reported. Jamie [name] is  taking all medications, participating in groups, and is calm and cooperative on the unit. He slept 7.5 hours last night, reported restful sleep. He is consistently eating 100% of his meals.    Jamie [name] was seen on the unit after breakfast this morning. He reported his mood as great and his affect was much brighter than yesterday. I shared that he has been denied from several Level II facilities and there is discussion of discharging him to continue the placement process at home, and he was very pleased with this news. He denied all psych ROS apart from the same visual disturbances/hallucinations as previously reported.     REVIEW OF SYSTEMS   Review of Systems   Respiratory: Negative for cough and shortness of breath.    Cardiovascular: Negative for chest pain.   Gastrointestinal: Negative for abdominal pain, constipation, diarrhea, nausea and vomiting.   Neurological: Negative for tremors and headaches.   Psychiatric/Behavioral: Positive for hallucinations. Negative for sleep disturbance and suicidal ideas. OBJECTIVE                    Vital Signs:  Current                Vital Signs: 24 Hour Range   BP: 124/74 (07/23 0700)  Temp: 36.3 ???C (97.4 ???F) (07/23 0700)  Pulse: 58 (07/23 0700)  Respirations: 18 PER MINUTE (07/23 0700)  SpO2: 98 % (07/23 0700) BP: (115-124)/(69-74)   Temp:  [36.3 ???C (97.4 ???F)-36.7 ???C (98.1 ???F)]   Pulse:  [58-83]   Respirations:  [17 PER MINUTE-18 PER MINUTE]   SpO2:  [98 %-99 %]    Intensity Pain Scale (Self Report): (not recorded)      Sleep:  Hours of Sleep: 7.50   Quality of Sleep: Resting Quietly    Scheduled Medications:  ARIPiprazole (ABILIFY) tablet 30 mg, 30 mg, Oral, QDAY  carvediloL (COREG) tablet 6.25 mg, 6.25 mg, Oral, BID  divalproex (DEPAKOTE ER) ER tablet 500 mg, 500 mg, Oral, QHS  folic acid (FOLVITE) tablet 1 mg, 1 mg, Oral, QDAY  lisinopriL (ZESTRIL) tablet 2.5 mg, 2.5 mg, Oral, QDAY  melatonin tablet 5 mg, 5 mg, Oral, QHS  nicotine (NICODERM CQ STEP 3) 7 mg/day patch 1 patch, 1 patch, Transdermal, QDAY    And  Verification of Patch Placement and Integrity - Nicotine 7 MG/24HR, , Transdermal, BID  spironolactone (ALDACTONE) tablet 25 mg, 25 mg, Oral, QDAY  thiamine (VITAMIN B-1) tablet 100 mg, 100 mg, Oral, QDAY        PRN Medications:  acetaminophen Q6H PRN 650 mg at 05/18/19 1036, calcium carbonate BID PRN, docusate Q12H PRN, hydrOXYzine TID PRN 25 mg at 05/06/19 2041, nicotine polacrilex Q1H PRN 2 mg at 05/22/19 2048, OLANZapine Q6H PRN **OR** OLANZapine Q6H PRN, traZODone QHS PRN 50 mg at 05/22/19 2048    Mental Status Exam:  ??? General/Constitutional: 23 y.o. male, appears stated age, wearing hospital attire, appropriate grooming  ??? Behavior: Overall calm and cooperative  ??? Speech/Motor: normal rate, rhythm, amount and volume; talkative. No abnormal movements.  ??? Eye Contact: good  ??? Mood: great  ??? Affect: bright, mood congruent  ??? Thought Process: linear  ??? Associations: intact  ??? Thought Content: no delusions, no SI/HI ??? Perception: no auditory hallucinations, reported visual floaters and halos (as before)  ??? Insight/Judgment: limited/fair    ??? Orientation: A&Ox4  ??? Recent and Remote Memory: intact  ??? Attention span and concentration: normal  ??? Language: fluent [place]  speaker  ??? Fund of knowledge and vocabulary: average/above average     ______________________________________________________________  Sinclair Ship, MS3

## 2019-05-23 NOTE — Care Plan
Pt has been in and out of the milieu.Bright affect.Calm,co-operative and med compliant.Continues to "see circles,dots and seeing people in a parallel world". Denies si/hi/ah/depression/anxiety.    Nicotine gum and Trazodone given as PRNs.

## 2019-05-23 NOTE — Progress Notes
TREATMENT TEAM NOTE  Name: Jamie [name]        MRN: [id number]          DOB: 1996-06-05          Age: 23 y.o.  Admission Date: 05/05/2019             LOS: 18 days      Attendees:   UR: Caroline More, RN  Nursing: Emilia Beck, RN, MSN  CM: Neita Carp, LMSW  CM: Sherlyn Lick, LPC  CM: Wonda Cheng, LMSW  Therapy: Wetzel Bjornstad, LMLP, LCP  Therapy: Jamison Oka, [facility]  Pharmacy: Rudene Christians, PharmD  Psychology: Linton Ham. Michiel Cowboy, PhD  Psychology Intern:  Blanchie Serve, [place]  Psychology Intern:  Anette Riedel, MA   Psychology Intern:  Redgie Grayer, MA     Significant Points Discussed: endorses AH; patient reports desire to discharge; patient denied by Level II placement yesterday    Treatment Plan Discussion: engage in treatment

## 2019-05-25 NOTE — Discharge Instructions - Pharmacy
Physician Discharge Summary      Name: Jamie [name] Medical Record Number: [id number] Date Of Birth:  [birth year]           Age:  23 years  Admit date:  05/05/2019 Account Number: [id number]   Discharge date:  05/23/2019    Attending Physician:  Jamie Lank, DO               Service: [facility] Adult [facility] 1    Physician Summary completed by: Jamie Skeen, MD    Reason for hospitalization: concerns of manic episode associated with binge drinking large amounts of alcohol and drug use.     Significant PMH:   Medical History:   Diagnosis Date   ??? Myocarditis (HCC)    ??? Schizoaffective disorder, bipolar type (HCC)         Allergies: Clozapine and Haldol [haloperidol lactate]    Admission Physical Exam notable for:  Performed by Jamie Singh, APRN-NP    Gen: alert and oriented, cooperative and no distress, talkative, tangential, poor eye contact.  Head: Normocephalic, without obvious abnormality, atraumatic   Eyes: conjunctivae/corneas clear.   Throat: right lower lip swelling/ bruise with laceration to inner lip ( healing)  Lungs: clear to auscultation bilaterally, no wheezes, rales, rhonchi   Heart: regular rate and rhythm, S1, S2 normal, no murmur, click, rub or gallop   Abdomen: soft, non-tender. Bowel sounds normal.  non-distended   Extremities: pulses palpable, no pedal edema or skin lesions   Neurologic: Grossly normal    Admission Lab/Radiology studies notable for:         - CBC, CMP, LFTs, UA are grossly unremarkable        - UDS, Magnesium, Phosphorous, GGT and valproic acid level ordered due to patient's delusions/inability to tell how much he has been drinking.  > EKG reviewed: QTc 418. Normal sinus rhythm. No ST changes.    Brief Hospital Course:    The patient was admitted and treated for possible manic behavior. Patient was brought in by his father after he confessed to his parents about risky behaviors such as binge drinking large quantities of alcohol and drug use. Patient also endorsed medication noncompliance for > 1.5 years due to metabolic side effects. During his admission interview, patient was hyperverbal, tangential, labile at times, had fixed delusions.     During this admission, the patient was monitored on AWAS and folate/thiamine supplements were started. Abilify 10mg  daily and valproic acid 1500mg  qhs were started for schizoaffective disorder. Medications were adjusted accordingly and patient tolerated the regimen well. Providers had discussions with the patient's parents/guardians. Parents reported significant concern about his and their safety. They requested the patient be placed on a LAI and they also requested a Level I and II screening for placement. Patient received Aristada 882mg  on 05/10/19 and continued Abilify 30 mg daily for a 21 days overlap. CM performed a Level I screening and the patient triggered a Level II due to his history of hospitalizations and mental illness. The patient was interviewed by several facilities and did not meet criteria at the time of hospitalization.    Throughout this admission, Jamie [name] appeared more linear, organized and less intrusive. He always denied SI/HI/AH and endorsed chronic visual disturbances of shapes/colors. He had no behavioral disturbances and participated fully in treatment. He was observed interacting appropriately with his peers and staff. He attended and participated in group therapy sessions and expressed significant improvement from admission. The patient reported distress over parents' role in  his life but throughout the admission he worked individually with therapists and acknowledged that he knows his parents care about him. Jamie [name] was expressed desire to work a stable job, cease use of drugs/alcohol and continue LAI post discharge.     The day of discharge, the patient appeared to be psychiatrically stabilized on psychotropic medications and psychotherapy. He displayed a forward thinking thought process with plans secure an interview at [facility] and was willing to compromise with his parents by attending their church. His mother, Jamie [name] was informed of discharge planning. She expressed concern about this plan as she would prefer the patient be placed at a Level II facility. Informed the patient's mother about his current stabilization and that Level II facility interview will continue after discharge. At discharge, the patient denied SI/HI/AH. Patient was advised to follow up with outpatient provider, maintain medication compliance, and was encouraged to avoid use of substances. He was agreeable to plan.       Mental Status Exam at Discharge   ??? General/Constitutional:???23 y.o.???male, dressed in hospital attire, appears as stated age, well-groomed  ??? Behavior: Overall calm and cooperative   ??? Speech/Motor:???Normal rate, rhythm, volume???and tone.???  ??? Eye Contact:???Good  ??? Mood: great  ??? Affect:???cheerful, full range, mood congruent   ??? Thought Process:???Linear???and logical, goal directed  ??? Associations:???Intact  ??? Thought Content:???No suicidal or homicidal ideations  ??? Perception:???No???AH. Chronic visual disturbances of shapes/dots.  ??? Insight/Judgment:???Fair/Fair  ???  ??? Orientation:???Clear???sensorium   ??? Recent and Remote Memory:???Intact  ??? Attention span and concentration:???Fair  ??? Language:???English, fluent  ??? Fund of knowledge and vocabulary:???Above???average     Condition at Discharge: Stable    Discharge Diagnoses:    Active Problems:    Schizoaffective disorder, bipolar type [facility])    Surgical Procedures: None    Significant Diagnostic Studies and Procedures: none    Consults:  Internal Medicine    Patient Disposition: Home       Is the patient being discharged on two or more antipsychotic medications? No  Is the patient being discharged on any antipsychotic medications?    Yes  Metabolic Monitoring         Body mass index is 31.17 kg/m???.  Wt Readings from Last 3 Encounters: 05/05/19 93 kg (205 lb)   05/02/19 95.3 kg (210 lb)   04/23/19 95.3 kg (210 lb)     BP Readings from Last 3 Encounters:   05/23/19 124/74   08/20/18 120/76   07/03/18 132/66     Lab Results   Component Value Date    CHOL 164 05/06/2019    TRIG 165 (H) 05/06/2019    HDL 46 05/06/2019    LDL 101 (H) 05/06/2019    VLDL 33 05/06/2019    NONHDLCHOL 118 05/06/2019    CHOLHDLC 3.5 10/08/2015     Hemoglobin A1C   Date Value Ref Range Status   05/06/2019 5.1 4.0 - 6.0 % Final     Comment:     The ADA recommends that most patients with type 1 and type 2 diabetes maintain   an A1c level <7%.       No results found for: A1C    Patient instructions/medications:      Other Diet    Please continue your healthy, balanced diet as is correlated to your current medical condition. If you have questions about your diet after you go home, you can call a dietitian at [phone].     Activity as Tolerated  It is important to keep increasing your activity level after you leave the hospital.  Moving around can help prevent blood clots, lung infection (pneumonia) and other problems.  Gradually increasing the number of times you are up moving around will help you return to your normal activity level more quickly.  Continue to increase the number of times you are up to the chair and walking daily to return to your normal activity level. Begin to work toward your normal activity level at discharge     Report These Signs and Symptoms    Please contact your doctor if you have any of the following symptoms: chest pain, fever, chills, dizziness, shortness of breath, nausea, vomiting, suicidal thoughts, homicidal thoughts, auditory or visual hallucinations.     Questions About Your Stay    For questions or concerns regarding your hospital stay, call [phone].     Discharging attending physician: Almira Bar [[id number]]          Medication List      Take these medications at their scheduled times      Indication ARIPiprazole 30 mg tablet  Dose:  30 mg  Take one tablet by mouth daily for 7 days. (total of 21 day oral overlap, last day of oral medication is 05/31/19)  What changed:    ??? medication strength  ??? how much to take  ??? additional instructions  ??? Another medication with the same name was removed. Continue taking this medication, and follow the directions you see here.  Commonly known as:  ABILIFY      ARIPiprazole lauroxil 882 mg/3.2 mL injection  Dose:  882 mg  Inject 3.2 mL into the muscle every 28 days. First injection received 05/10/19; next injection due 06/07/19)  Commonly known as:  ARISTADA      carvediloL 6.25 mg tablet  Dose:  6.25 mg  Take one tablet by mouth twice daily. Take with food.  Commonly known as:  COREG      divalproex 500 mg ER tablet  Dose:  500 mg  Take one tablet by mouth at bedtime daily. Take with food.  What changed:  how much to take  Commonly known as:  DEPAKOTE ER      lisinopriL 2.5 mg tablet  Dose:  2.5 mg  Take one tablet by mouth daily.  Commonly known as:  ZESTRIL      melatonin 3 mg Tab  Dose:  3 mg  Take one tablet by mouth at bedtime daily.  What changed:  how much to take      spironolactone 25 mg tablet  Dose:  25 mg  Take one tablet by mouth daily. Take with food.  Commonly known as:  ALDACTONE               Pending items needing follow up: NONE    Signed:  Era Skeen, MD  05/25/2019      cc:  Primary Care Physician:  Alroy Bailiff   Verified

## 2019-05-27 ENCOUNTER — Encounter: Admit: 2019-05-27 | Discharge: 2019-05-27

## 2019-05-27 DIAGNOSIS — I514 Myocarditis, unspecified: Secondary | ICD-10-CM

## 2019-05-27 DIAGNOSIS — F250 Schizoaffective disorder, bipolar type: Secondary | ICD-10-CM

## 2019-05-31 ENCOUNTER — Encounter: Admit: 2019-05-31 | Discharge: 2019-05-31

## 2019-05-31 MED ORDER — ARIPIPRAZOLE LAUROXIL 882 MG/3.2 ML IM SERS
882 mg | INTRAMUSCULAR | 0 refills | Status: DC
Start: 2019-05-31 — End: 2019-08-27

## 2019-05-31 NOTE — Telephone Encounter
Clinical Pharmacist Note - Psychiatry   Visit Type: Telephone    Patient is due for Aristada on 8/6; however, patient needs patient assistance.  Previously attempted x several months to obtain income information to apply.  Spoke with patient's mother, Jerene Pitch.  Jerene Pitch said she had provided this information to inpatient team.  Will f/u with team.     If not able to get injection approved, will need to switch back to oral.  Last Aristada dose equal to about 20mg  daily of aripiprazole.  Will continue to follow.    Visit time: 15 minutes  Outcome: Cost intervention

## 2019-06-07 NOTE — Telephone Encounter
Clinical Pharmacist Note - Psychiatry   Visit Type: Telephone    Followed up with inpatient team - they never received income documentation for Jamie [name].  Not able to apply for patient assistance without this information.  Patient is due for injection at this time.  Would recommend switching back to oral aripiprazole 20mg  daily in interim.     Attempted to contact patient's guardian, Jamie [name].  No answer.  LVM requesting call back.     Visit time: 10 minutes  Outcome: Will discuss medication changes with provider

## 2019-06-10 ENCOUNTER — Encounter: Admit: 2019-06-10 | Discharge: 2019-06-10

## 2019-06-11 ENCOUNTER — Encounter: Admit: 2019-06-11 | Discharge: 2019-06-11

## 2019-06-12 ENCOUNTER — Encounter: Admit: 2019-06-12 | Discharge: 2019-06-12

## 2019-06-12 NOTE — Telephone Encounter
Third attempt to contact patient's mother and guardian, Jerene Pitch. Also attempted to contact patient with no answer.  No phone listed for patient's father.    Patient needs to restart oral aripiprazole as he is overdue for injection.  Additionally, cannot restart injection until income information is received as patient requires patient assistance program to afford injection.

## 2019-06-24 ENCOUNTER — Encounter: Admit: 2019-06-24 | Discharge: 2019-06-24

## 2019-06-24 DIAGNOSIS — I514 Myocarditis, unspecified: Secondary | ICD-10-CM

## 2019-06-24 DIAGNOSIS — F250 Schizoaffective disorder, bipolar type: Secondary | ICD-10-CM

## 2019-06-24 NOTE — Telephone Encounter
Clinical Pharmacist Note - Psychiatry   Visit Type: Telephone    Spoke with patient's mother regarding Aristada patient assistance.  Explained that we needed verification of patient's income to proceed with application.  Provided writer's email to send documentation to.  She will send documentation and then pharmacist can proceed with application.    Visit time: 10 minutes  Outcome: Cost intervention

## 2019-07-02 ENCOUNTER — Encounter: Admit: 2019-07-02 | Discharge: 2019-07-02

## 2019-07-02 NOTE — Telephone Encounter
Attempted to f/u with patient's guardian re: aristada injection.  No answer.  LVM requesting call back.

## 2019-07-29 ENCOUNTER — Encounter

## 2019-07-29 DIAGNOSIS — F250 Schizoaffective disorder, bipolar type: Secondary | ICD-10-CM

## 2019-07-29 DIAGNOSIS — I514 Myocarditis, unspecified: Secondary | ICD-10-CM

## 2019-08-26 ENCOUNTER — Encounter: Admit: 2019-08-26 | Discharge: 2019-08-26

## 2019-08-26 MED ORDER — DIVALPROEX 500 MG PO TB24
1000 mg | ORAL_TABLET | Freq: Every evening | ORAL | 2 refills | 30.00000 days | Status: DC
Start: 2019-08-26 — End: 2019-08-27

## 2019-08-26 NOTE — Telephone Encounter
Script sent 06/24/19 qty: 30 tablets, sig: take two tablets by mouth at bedtime daily. Pharmacy requesting updated quantity 60 tablets for 30 day supply.

## 2019-08-27 ENCOUNTER — Encounter: Admit: 2019-08-27 | Discharge: 2019-08-27

## 2019-08-27 DIAGNOSIS — F250 Schizoaffective disorder, bipolar type: Secondary | ICD-10-CM

## 2019-08-27 DIAGNOSIS — I514 Myocarditis, unspecified: Secondary | ICD-10-CM

## 2019-08-27 MED ORDER — DIVALPROEX 500 MG PO TB24
1500 mg | ORAL_TABLET | Freq: Every evening | ORAL | 2 refills | 30.00000 days | Status: DC
Start: 2019-08-27 — End: 2019-10-17

## 2019-08-27 MED ORDER — ARIPIPRAZOLE 20 MG PO TAB
20 mg | ORAL_TABLET | Freq: Every day | ORAL | 2 refills | Status: DC
Start: 2019-08-27 — End: 2019-10-17

## 2019-09-02 ENCOUNTER — Encounter: Admit: 2019-09-02 | Discharge: 2019-09-02

## 2019-09-23 ENCOUNTER — Encounter: Admit: 2019-09-23 | Discharge: 2019-09-23

## 2019-09-23 DIAGNOSIS — I514 Myocarditis, unspecified: Secondary | ICD-10-CM

## 2019-09-23 DIAGNOSIS — F250 Schizoaffective disorder, bipolar type: Secondary | ICD-10-CM

## 2019-09-23 NOTE — Telephone Encounter
Percell Locus, with [facility], called inquiring about a prescription form that was sent to them incomplete.  Section 6 needs patient name/date.  The form is a Rx request application.  She is going to fax back to [place] to complete.

## 2019-10-09 ENCOUNTER — Encounter: Admit: 2019-10-09 | Discharge: 2019-10-09

## 2019-10-09 NOTE — Progress Notes
Aristada form first faxed about 2 months ago.  Received notification from [place] this week that form needed updated.  Updated form faxed back today.

## 2019-10-15 NOTE — Progress Notes
Subjective:    History of Present Illness     23 y.o. white male with hx of Schizoaffective Bipolar Type and hx of clozapine induced myocarditis last seen on 08/27/19 by Dr. Ellyn Hack. At that time, it was recommended to continue Abilify 20 mg daily and depakote ER 1500 mg at bedtime with a goal to transition to IM pending paperwork completion. Patient presents unaccompanied for routine follow up. His father Rudell Cobb and wife, [name], are his legal guardians.    Psychiatric: Generally, the patient reports he continues to do well as he has maintained compliance with Abilify and Depakote. No significant mood swings including depression or mania. The neighbor that encouraged him to drink no longer lives in his apartment complex, so he is generally making healthier choices. Sleep and appetite are stable. Patient admits to experiencing residual psychotic symptoms as previously described including a dual reality and shapes and stars. It did not dissipate at all when the dose of Abilify went from 15 mg to 20 mg daily. They are still present when he closes his eyes, therefore he thinks it is part of his psychiatric illness/VH rather than an issue with his eyes. It only becomes distressing when he is overwhelmed or stressed because then he is not as able to readily ignore it. Denies overt AH although does refer to the Goddess as it is a male voice. Alluded to a possible delusion related to being able to feel a male body out of inanimate objects/personal body parts. Denies paranoia. No SI/HI. Meets with Dr.Poje for therapy.     Medical:  Not evaluated by ophthalmology for vision. Plays pickle ball for exercise. Social: Living in [place] in apartment by himself. 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 Now has his own car again as he has demonstrated responsibility. Alcohol - tries to avoid alcohol, now in moderation, no more than 4 drinks per week.  Caffeine - varies. Relies on a male friend for support. Family relationship improved/good although wishes they were more supportive of his desire to pursue higher education.       Review of Systems   Constitutional: Negative for appetite change, fatigue and unexpected weight change.   Eyes: Positive for visual disturbance.   Genitourinary: Negative for difficulty urinating.   Musculoskeletal: Negative for arthralgias.   Neurological: Negative for tremors.   Hematological: Does not bruise/bleed easily.   Psychiatric/Behavioral: Negative for confusion.         Objective:         ? ARIPiprazole (ABILIFY) 20 mg tablet Take one tablet by mouth daily.   ? carvediloL (COREG) 6.25 mg tablet Take one tablet by mouth twice daily. Take with food.   ? divalproex (DEPAKOTE ER) 500 mg ER tablet Take three tablets by mouth at bedtime daily. Take with food.   ? lisinopriL (ZESTRIL) 2.5 mg tablet Take one tablet by mouth daily.   ? melatonin 3 mg tab Take one tablet by mouth at bedtime daily. (Patient taking differently: Take 5 mg by mouth at bedtime daily.)   ? spironolactone (ALDACTONE) 25 mg tablet Take one tablet by mouth daily. Take with food.     Vitals:    10/17/19 0913   BP: (!) 140/81   Pulse: 60   Weight: 101.3 kg (223 lb 6.4 oz)   Height: 172.7 cm (68)     Body mass index is 33.97 kg/m?Marland Kitchen       Physical Exam   General/Constitutional: 23 y.o.  white male, dressed in casual attire, good grooming, clean shaven  Eye Contact: good, appropriate   Behavior: cooperative, nervous at times (hand clenching, pulled close in lap)  Speech: normal rate, tone, and volume, effort at articulation at times  Mood: good Affect: euthymic with underlying nervousness, mood congruent  Thought Process: overall linear, goal directed   Thought Content: Denies SI, HI, mild evidence of delusions as above   Perception: Endorses residual AVH  Associations: intact  Insight: partial   Judgement: partial     Orientation: intact  Recent and remote memory: intact  Attention span and concentration: intact to conversation  Cognition: intact  Language: English, fluent  Fund of knowledge/vocabulary: average    Neuro: no appreciable tremor, cogwheeling or rigidity       Metabolic monitoring:     Body mass index is 33.97 kg/m?Marland Kitchen  Wt Readings from Last 3 Encounters:   10/17/19 101.3 kg (223 lb 6.4 oz)   09/23/19 99.8 kg (220 lb)   08/27/19 95.3 kg (210 lb)     BP Readings from Last 3 Encounters:   10/17/19 (!) 140/81   05/23/19 124/74   08/20/18 120/76     No data recorded    Lab Results   Component Value Date    CHOL 164 05/06/2019    TRIG 165 (H) 05/06/2019    HDL 46 05/06/2019    LDL 101 (H) 05/06/2019    VLDL 33 05/06/2019    NONHDLCHOL 118 05/06/2019    CHOLHDLC 3.5 10/08/2015     Hemoglobin A1C   Date Value Ref Range Status   05/06/2019 5.1 4.0 - 6.0 % Final     Comment:     The ADA recommends that most patients with type 1 and type 2 diabetes maintain   an A1c level <7%.         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 Lowry Bowl  - Recommend routine eye screening for completion  - Safety planning reviewed and provided in AVS.    Return to clinic in 8 weeks.     Patient seen and discussed with Dr. Nemiah Commander.     Zollie Beckers, DO  PGY-5, Internal Medicine/Psychiatry

## 2019-10-17 ENCOUNTER — Encounter: Admit: 2019-10-17 | Discharge: 2019-10-17

## 2019-10-17 ENCOUNTER — Ambulatory Visit: Admit: 2019-10-17 | Discharge: 2019-10-17 | Payer: MEDICARE

## 2019-10-17 DIAGNOSIS — Z79899 Other long term (current) drug therapy: Secondary | ICD-10-CM

## 2019-10-17 DIAGNOSIS — F250 Schizoaffective disorder, bipolar type: Secondary | ICD-10-CM

## 2019-10-17 DIAGNOSIS — I514 Myocarditis, unspecified: Secondary | ICD-10-CM

## 2019-10-17 MED ORDER — ARIPIPRAZOLE 10 MG PO TAB
25 mg | ORAL_TABLET | Freq: Every day | ORAL | 1 refills | Status: DC
Start: 2019-10-17 — End: 2019-12-09

## 2019-10-17 MED ORDER — DIVALPROEX 500 MG PO TB24
1500 mg | ORAL_TABLET | Freq: Every evening | ORAL | 2 refills | 30.00000 days | Status: DC
Start: 2019-10-17 — End: 2019-12-09

## 2019-10-29 ENCOUNTER — Encounter: Admit: 2019-10-29 | Discharge: 2019-10-29

## 2019-10-29 NOTE — Telephone Encounter
Clinical Pharmacist Note - Psychiatry   Visit Type: Telephone    [facility].  They need the clinic PTAN which I provided but they said it was incorrect.  Will follow up with clinic manager.    Visit time: 10 minutes  Outcome: Cost intervention

## 2019-10-29 NOTE — Telephone Encounter
Cindy with [facility] left VM stating that the she was calling the clinic pharmacist regarding benefits verification for patient's medication. Cindy requesting a call back.     Callback number: [phone].

## 2019-11-28 ENCOUNTER — Encounter: Admit: 2019-11-28 | Discharge: 2019-11-28

## 2019-11-28 NOTE — Telephone Encounter
[name] rep would like a return call back at [phone]  regarding coverage.

## 2019-11-28 NOTE — Telephone Encounter
Clinical Pharmacist Note - Psychiatry   Visit Type: Telephone    Spoke with [facility] care support.  Aristada would be covered under Muzamil's Part B benefits but he has a 20% copay.  Called patient's mom and discussed that cost could up to $500 per month with the 20% copay.  Discussed that due to cost we could hold off at this time and pursue further in the future if he starts having problems with adherence.  She is going to discuss this with his dad and the patient.  She will call back with any questions or concerns.    Visit time: 20 minutes  Outcome: Cost intervention

## 2019-12-04 ENCOUNTER — Encounter: Admit: 2019-12-04 | Discharge: 2019-12-04

## 2019-12-04 ENCOUNTER — Inpatient Hospital Stay: Admit: 2019-12-04 | Payer: MEDICARE

## 2019-12-04 DIAGNOSIS — F17200 Nicotine dependence, unspecified, uncomplicated: Secondary | ICD-10-CM

## 2019-12-04 DIAGNOSIS — I514 Myocarditis, unspecified: Secondary | ICD-10-CM

## 2019-12-04 DIAGNOSIS — F259 Schizoaffective disorder, unspecified: Secondary | ICD-10-CM

## 2019-12-04 DIAGNOSIS — F1020 Alcohol dependence, uncomplicated: Secondary | ICD-10-CM

## 2019-12-04 DIAGNOSIS — F250 Schizoaffective disorder, bipolar type: Secondary | ICD-10-CM

## 2019-12-04 LAB — COMPREHENSIVE METABOLIC PANEL
Lab: 0.6 mg/dL (ref 0.3–1.2)
Lab: 1 mg/dL (ref 0.4–1.24)
Lab: 104 MMOL/L (ref 98–110)
Lab: 13 mg/dL (ref 7–25)
Lab: 140 MMOL/L (ref 137–147)
Lab: 17 U/L (ref 7–40)
Lab: 26 U/L (ref 7–56)
Lab: 27 MMOL/L (ref 21–30)
Lab: 4.7 g/dL (ref 3.5–5.0)
Lab: 7 g/dL (ref 6.0–8.0)
Lab: 70 U/L (ref 25–110)
Lab: 9.6 mg/dL (ref 8.5–10.6)
Lab: 99 mg/dL (ref 70–100)
Lab: >60 mL/min (ref >60)
Lab: >60 mL/min (ref >60)
Lab: 9 (ref 3–12)

## 2019-12-04 LAB — VALPROIC ACID LEVEL: Lab: 6 ug/mL — ABNORMAL LOW (ref 50.0–100.0)

## 2019-12-04 LAB — COVID-19 (SARS-COV-2) PCR

## 2019-12-04 LAB — CBC: Lab: 7.5 10*3/uL (ref 4.5–11.0)

## 2019-12-04 MED ORDER — POLYETHYLENE GLYCOL 3350 17 GRAM PO PWPK
1 | Freq: Every day | ORAL | 0 refills | Status: DC | PRN
Start: 2019-12-04 — End: 2019-12-09

## 2019-12-04 MED ORDER — OLANZAPINE 10 MG IM SOLR
5 mg | INTRAMUSCULAR | 0 refills | Status: DC | PRN
Start: 2019-12-04 — End: 2019-12-09

## 2019-12-04 MED ORDER — DIVALPROEX 500 MG PO TB24
1000 mg | Freq: Every evening | ORAL | 0 refills | Status: DC
Start: 2019-12-04 — End: 2019-12-06
  Administered 2019-12-05 – 2019-12-06 (×2): 1000 mg via ORAL

## 2019-12-04 MED ORDER — ARIPIPRAZOLE 10 MG PO TAB
10 mg | Freq: Every day | ORAL | 0 refills | Status: DC
Start: 2019-12-04 — End: 2019-12-06
  Administered 2019-12-04 – 2019-12-06 (×3): 10 mg via ORAL

## 2019-12-04 MED ORDER — CALCIUM CARBONATE 200 MG CALCIUM (500 MG) PO CHEW
500 mg | ORAL | 0 refills | Status: DC | PRN
Start: 2019-12-04 — End: 2019-12-09
  Administered 2019-12-05 – 2019-12-09 (×3): 500 mg via ORAL

## 2019-12-04 MED ORDER — HYDROXYZINE HCL 25 MG PO TAB
25 mg | ORAL | 0 refills | Status: DC | PRN
Start: 2019-12-04 — End: 2019-12-09
  Administered 2019-12-06: 05:00:00 25 mg via ORAL

## 2019-12-04 MED ORDER — OLANZAPINE 5 MG PO TBDI
5 mg | ORAL | 0 refills | Status: DC | PRN
Start: 2019-12-04 — End: 2019-12-09
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
## 2019-12-04 NOTE — Progress Notes
PSYCHIATRIC NURSING ADMISSION ASSESSMENT     NAME:Taisei Lawrance Heinicke             MRN: [id number]             DOB:08/15/1996          AGE: 24 y.o.  ADMISSION DATE: 12/04/2019             DAYS ADMITTED: LOS: 0 days    Patient presents to the unit from: Home - drove personal car here and presented as a walk in.    Patient states the reason for admission is mania or psychosis.    Patient presented as a walk in with CC of mania or possibly psychosis. Previous admission to [facility] in 05/2019 reveals that patient is diagnosed with Schizoaffective d/o and sees Dr. Lowry Bowl for psychiatric care. Patient's presentation is clean and well dressed, he has an increased amount of speech and it is pressured at time with some flight of ideas but he is able to answer questions appropriately and can rest calmly in his room when not interacting with staff. Patient states he is supposed to be taking Depakote (like a bunch of 500's) and 10 mg of something but he stopped taking them on Saturday. He stated he no longer felt like taking them. He has since had a decrease in sleep and an increase in racing thoughts. He reports he is easily agitated and is having extreme emotions. He also reports thinking of doing extreme behaviors, like I thought of 10 ways to kill myself, but I wouldn't do that, I also thought of how I could hurt others, but I wouldn't do that either. He denies current SI, HI, or AH. He reports having VH of shapes or floaters that is constant. HE rates depression 4/10 and anxiety 11/10. He states that when he is able to sleep he only sleeps for 3-4 hours at a time. He reports his appetite is not consistent and he often goes without eating. Patient denies any medical issues including pain, SOA, chest tightness, nausea, vomiting, diarrhea, or wounds. He has full range of motion and upon skin assessment his skin is dry and intact. His blood pressure is slightly elevated but otherwise vitals are WNL. He denied any recent drug or alcohol usage.  This assessment information was presented to Dr. Barry Dienes and he determined patient was appropriate for admission to [facility]. Patient informed this Clinical research associate that his parents were his guardians and they were contacted for permission to admit patient to [facility]. Patient was compliant with lab draws, COVID swab, and his UDS was negative. He had a snack and has been resting quietly in his room without any issue.    Mental Status Exam  Legal Status: Voluntary Admission  General Appearance: Normal  Mood / Affect: Elevated Mood  Speech: Excessive amount  Content Of Thought: Normal  Motor Activity: Normal  Flow Of Thought: Flight of Ideas, Racing Thoughts  Insight / Judgment: Fair Judgment, Fair Insight  Behavior: Normal  Patient Strengths: Setting and pursuing goals, Interpersonal relationships and supports, i.e. family, friends, peers, Access to housing/residential stability, Steady employment    [employer]  Have you ever felt you ought to cut down on your drinking or drug use?: Yes  Have people annoyed you by criticizing your drinking or drug use?: Yes  Have you felt bad or guilty about your drinking or drug use?: Yes  Have you ever had a drink or used drugs first thing in the morning to steady your  nerves or to get rid of a hangover (eye-opener)?: No  Total Score: 3    AUDIT-C  How Often Drink w/ Alcohol?: Monthly or less  # Drinks w/ Alcohol In Typical Day?: 3 or 4  How Often 6+ Drinks Per Occasion?: Less than monthly  AUDIT-C Total Score: 3    Contraband/Body Checks  Patient Searched: Yes  Belongings Searched: Yes  Head Lice Check: Yes             Kasandra Knudsen  12/04/2019

## 2019-12-04 NOTE — Progress Notes
Received report and assumed care. Introduced myself to patient and provided breakfast.

## 2019-12-04 NOTE — Consults
Internal Medicine Initial Consult Note      Admission Date: 12/04/2019                                                LOS: 0 days    Reason for Consult: Medical Management     Consult type: Recommendations with Orders     Assessment  Active Problems:    * No active hospital problems. *      24 y.o. male  with a past medication history of chronic diastolic heart failure, history of clozapine-induced myocarditis, schizoaffective bipolar type, polysubstance abuse admitted to [facility]  for psychosis/mania.    1. Psychosis/mania.  History of schizoaffective bipolar type disorder.  Management per inpatient psychiatric team  2. Polysubstance abuse.  Previous history of marijuana, bath salts, K2 and cocaine use.  Patient reports cocaine use 2-3 weeks ago which may have precipitated current state.  UDS on admission negative.  3. History of chronic diastolic heart failure and clozapine-induced myocarditis.  Previously was on medications and lost to follow-up with cardiology.  Stopped taking medications due to cost last admission.  4. Lower back tenderness secondary to recent fall.  No bruising or trauma noted.  5. GERD    EKG: Sinus rhythm.  Heart rate 57.  QTc 387.  Probable left ventricular hypertrophy.  COVID 19- negative    Recommendations:  -Psychiatric management per inpatient team  -Tums for reflux  -Tylenol as needed and/or lidocaine patch for back pain post fall        The patient is admitted to inpatient psychiatric unit.  The inpatient psychiatric team is following.  Internal medicine team was consulted for medical evaluation. I reviewed patient medical history, vitals, medications, labs and available medical records  .    Thanks for the consult , the patient is currently medically stable we will be happy to see the patient again if needed     Internal medicine can be reached out by Looking up [name] on Voalte or Cureatr app for direct app communication  or by paging [phone] Dorris Singh, APRN-NP   Pager [phone]   ______________________________________________________________________      History of Present Illness: Jamie [name] is a 24 y.o. male a past medication history of chronic diastolic heart failure, history of clozapine-induced myocarditis, schizoaffective bipolar type, polysubstance abuse admitted to [facility]  for psychosis/mania.  Patient states my grasp of space and time is gone.  Endorses extreme behavior in which no time nothing is normal.  Patient states I view everything is extraordinary.  Patient reports he did not want to stress his family and friends with his behavior therefore came for help.  When asked whether there were any recent events that could have precipitated this current state he is in, patient reports that he has been hanging out with a bad friend, drinking more than he should and recently used cocaine approximately 3 weeks ago. Reports that he has been having thoughts that my body is not entirely human.  However he then realizes that he is in fact human.    Endorses visual hallucinations in which he sees (stars, red dots, green dots and organize around objects).  Endorses headache likely secondary to dehydration and numbness and tingling in his feet.  Denies SI, HI.  Denies auditory hallucinations.  Denies chest pain, shortness  of breath, abdominal pain, dysuria.      Denies smoking.  Reports that he has been drinking a lot.  States he drinks anywhere between 0-3 times per week.  Medications: Denies taking medications on a daily basis    Medical History:   Diagnosis Date   ? Myocarditis (HCC)    ? Schizoaffective disorder, bipolar type (HCC)      History reviewed. No pertinent surgical history.  Social History     Socioeconomic History   ? Marital status: Single     Spouse name: Not on file   ? Number of children: Not on file   ? Years of education: Not on file   ? Highest education level: Not on file   Occupational History ? Not on file   Social Needs   ? Financial resource strain: Not on file   ? Food insecurity     Worry: Not on file     Inability: Not on file   ? Transportation needs     Medical: Not on file     Non-medical: Not on file   Tobacco Use   ? Smoking status: Never Smoker   ? Smokeless tobacco: Current User     Types: Chew   Substance and Sexual Activity   ? Alcohol use: Yes     Comment: 1-2 x/week   ? Drug use: Yes     Types: Marijuana     Comment: On occassion   ? Sexual activity: Not on file   Lifestyle   ? Physical activity     Days per week: Not on file     Minutes per session: Not on file   ? Stress: Not on file   Relationships   ? Social Wellsite geologist on phone: Not on file     Gets together: Not on file     Attends religious service: Not on file     Active member of club or organization: Not on file     Attends meetings of clubs or organizations: Not on file     Relationship status: Not on file   ? Intimate partner violence     Fear of current or ex partner: Not on file     Emotionally abused: Not on file     Physically abused: Not on file     Forced sexual activity: Not on file   Other Topics Concern   ? Military Service No   ? Blood Transfusions No   ? Caffeine Concern No   ? Occupational Exposure No   ? Hobby Hazards No   ? Sleep Concern Not Asked   ? Stress Concern Not Asked   ? Weight Concern No   ? Special Diet No   ? Back Care No   ? Exercise No   ? Bike Helmet Not Asked   ? Seat Belt Not Asked   ? Self-Exams Not Asked   Social History Narrative   ? Not on file     Family history reviewed; non-contributory    Allergies:  Clozapine and Haldol [haloperidol lactate]    Scheduled Meds: Continuous Infusions:  PRN and Respiratory Meds:calcium carbonate Q2H PRN, hydrOXYzine Q6H PRN, OLANZapine Q6H PRN, OLANZapine Q6H PRN, polyethylene glycol 3350 QDAY PRN    Review of Systems: A 14 point review of systems was negative except for: Visual hallucinations, headache, dehydration, numbness and tingling in his feet      Vital Signs:  Last Filed in 24  hours Vital Signs:  24 hour Range    BP: 147/96 (02/03 0519)  Temp: 36.7 ?C (98.1 ?F) (02/03 1610)  Pulse: 92 (02/03 0519)  Respirations: 18 PER MINUTE (02/03 0519)  SpO2: 97 % (02/03 0519)  Height: 172.7 cm (68) (02/03 0519) BP: (147)/(96)   Temp:  [36.7 ?C (98.1 ?F)]   Pulse:  [92]   Respirations:  [18 PER MINUTE]   SpO2:  [97 %]      Physical Exam:  Gen: alert and oriented, cooperative and no distress, talkative  Head: Normocephalic, without obvious abnormality, atraumatic   Eyes: conjunctivae/corneas clear. PERRL, EOM's intact  Neck: supple, symmetrical, trachea midline  Lungs: clear to auscultation bilaterally, no wheezes, rales, rhonchi   Heart: regular rate and rhythm, S1, S2 normal, no murmur, click, rub or gallop   Abdomen: soft, non-tender. Bowel sounds normal.   Extremities: pulses palpable, no pedal edema or skin lesions   Neurologic: Grossly normal      Lab Review  24-hour labs:    Results for orders placed or performed during the hospital encounter of 12/04/19 (from the past 24 hour(s))   CBC    Collection Time: 12/04/19  6:40 AM   Result Value Ref Range    White Blood Cells 7.5 4.5 - 11.0 K/UL    RBC 5.01 4.4 - 5.5 M/UL    Hemoglobin 15.7 13.5 - 16.5 GM/DL    Hematocrit 96.0 40 - 50 %    MCV 89.3 80 - 100 FL    MCH 31.3 26 - 34 PG    MCHC 35.0 32.0 - 36.0 G/DL    RDW 45.4 11 - 15 %    Platelet Count 239 150 - 400 K/UL    MPV 9.0 7 - 11 FL   COMPREHENSIVE METABOLIC PANEL    Collection Time: 12/04/19  6:40 AM   Result Value Ref Range    Sodium 140 137 - 147 MMOL/L    Potassium 4.3 3.5 - 5.1 MMOL/L    Chloride 104 98 - 110 MMOL/L    Glucose 99 70 - 100 MG/DL    Blood Urea Nitrogen 13 7 - 25 MG/DL    Creatinine 0.98 0.4 - 1.24 MG/DL    Calcium 9.6 8.5 - 11.9 MG/DL    Total Protein 7.0 6.0 - 8.0 G/DL Total Bilirubin 0.6 0.3 - 1.2 MG/DL    Albumin 4.7 3.5 - 5.0 G/DL    Alk Phosphatase 70 25 - 110 U/L    AST (SGOT) 17 7 - 40 U/L    CO2 27 21 - 30 MMOL/L    ALT (SGPT) 26 7 - 56 U/L    Anion Gap 9 3 - 12    eGFR Non African American >60 >60 mL/min    eGFR African American >60 >60 mL/min   VALPROIC ACID LEVEL    Collection Time: 12/04/19  6:40 AM   Result Value Ref Range    Valproic Acid 6.0 (L) 50.0 - 100.0 MCG/ML       Point of Care Testing  (Last 24 hours)       Radiology and other Diagnostics Review:    EKG Reviewed      Tamisha Nordstrom Darcey Nora, APRN-NP  Available on Voalte or Cureatr app under [facility]    pager [phone]

## 2019-12-04 NOTE — Care Plan
Patient arrived on unit accompanied by staff at 1635.  Patient was oriented to room, unit and unit protocol.  Patient currently denies any SI/HI/AVH and pain.  Rates his anxiety 7/10, and depression 2/10.  Said that he was mainly manic and felt "exuberant" but also "anxious, worried, and stressed."  VSS/WNL.  Patient is on Q15 minute checks, will continue to monitor for behavior and safety.    Problem: Discharge Planning  Goal: Participation in plan of care  Outcome: Goal Ongoing  Goal: Knowledge regarding plan of care  Outcome: Goal Ongoing  Goal: Prepared for discharge  Outcome: Goal Ongoing     Problem: Harm to self, low risk of suicide  Goal: Absence of harm to self, low risk of suicide  Outcome: Goal Ongoing

## 2019-12-05 NOTE — Progress Notes
Chart check completed.

## 2019-12-05 NOTE — Progress Notes
Chart check completed

## 2019-12-05 NOTE — Care Plan
The pt went to his room around 2030 to lay down. The pt stated today was better than yesterday. The pt stated his anxiety was a 5 and his depression was 1. The pt denies any SI or HI. The pt denies any AH. The pt states he still is experiencing some floaters.  The pt stated his appetite has been good. The pt is currently resting quietly.  Problem: Discharge Planning  Goal: Participation in plan of care  Outcome: Add Note  Goal: Prepared for discharge  Outcome: Add Note     Problem: Harm to self, low risk of suicide  Goal: Absence of harm to self, low risk of suicide  Outcome: Add Note

## 2019-12-06 MED ORDER — ARIPIPRAZOLE 15 MG PO TAB
15 mg | Freq: Every day | ORAL | 0 refills | Status: DC
Start: 2019-12-06 — End: 2019-12-09
  Administered 2019-12-07 – 2019-12-09 (×3): 15 mg via ORAL

## 2019-12-06 MED ORDER — DIVALPROEX 500 MG PO TB24
1500 mg | Freq: Every evening | ORAL | 0 refills | Status: DC
Start: 2019-12-06 — End: 2019-12-09
  Administered 2019-12-07 – 2019-12-09 (×3): 1500 mg via ORAL

## 2019-12-08 MED ORDER — MELATONIN 5 MG PO TAB
5 mg | Freq: Every evening | ORAL | 0 refills | Status: DC | PRN
Start: 2019-12-08 — End: 2019-12-09
  Administered 2019-12-09: 04:00:00 5 mg via ORAL

## 2019-12-08 MED ORDER — LOPERAMIDE 2 MG PO CAP
2 mg | ORAL | 0 refills | Status: DC | PRN
Start: 2019-12-08 — End: 2019-12-09

## 2019-12-09 ENCOUNTER — Encounter: Admit: 2019-12-09 | Discharge: 2019-12-09

## 2019-12-09 MED ORDER — ARIPIPRAZOLE 15 MG PO TAB
15 mg | ORAL_TABLET | Freq: Every day | ORAL | 0 refills | Status: DC
Start: 2019-12-09 — End: 2019-12-23

## 2019-12-09 MED ORDER — DIVALPROEX 500 MG PO TB24
1500 mg | ORAL_TABLET | Freq: Every evening | ORAL | 0 refills | 30.00000 days | Status: DC
Start: 2019-12-09 — End: 2019-12-23

## 2019-12-09 MED ORDER — MELATONIN 5 MG PO TAB
5 mg | Freq: Every evening | ORAL | 0 refills | 28.00000 days | Status: AC | PRN
Start: 2019-12-09 — End: ?

## 2019-12-09 MED ORDER — DIVALPROEX 500 MG PO TB24
1500 mg | ORAL_TABLET | Freq: Every evening | ORAL | 0 refills | 30.00000 days | Status: DC
Start: 2019-12-09 — End: 2019-12-09

## 2019-12-09 MED ORDER — ARIPIPRAZOLE 15 MG PO TAB
15 mg | ORAL_TABLET | Freq: Every day | ORAL | 0 refills | Status: DC
Start: 2019-12-09 — End: 2019-12-09

## 2019-12-09 MED ORDER — LOPERAMIDE 2 MG PO CAP
ORAL_CAPSULE | ORAL | 0 refills | 10.00000 days | Status: AC
Start: 2019-12-09 — End: ?

## 2019-12-14 ENCOUNTER — Encounter: Admit: 2019-12-14 | Discharge: 2019-12-14

## 2019-12-14 ENCOUNTER — Inpatient Hospital Stay: Admit: 2019-12-14 | Payer: MEDICARE

## 2019-12-14 DIAGNOSIS — F22 Delusional disorders: Secondary | ICD-10-CM

## 2019-12-14 DIAGNOSIS — I514 Myocarditis, unspecified: Secondary | ICD-10-CM

## 2019-12-14 DIAGNOSIS — F69 Unspecified disorder of adult personality and behavior: Secondary | ICD-10-CM

## 2019-12-14 DIAGNOSIS — F17200 Nicotine dependence, unspecified, uncomplicated: Secondary | ICD-10-CM

## 2019-12-14 DIAGNOSIS — F250 Schizoaffective disorder, bipolar type: Secondary | ICD-10-CM

## 2019-12-14 DIAGNOSIS — F1020 Alcohol dependence, uncomplicated: Secondary | ICD-10-CM

## 2019-12-14 DIAGNOSIS — F29 Unspecified psychosis not due to a substance or known physiological condition: Secondary | ICD-10-CM

## 2019-12-14 LAB — URINALYSIS MICROSCOPIC REFLEX TO CULTURE

## 2019-12-14 LAB — BARBITURATES-URINE RANDOM: Lab: NEGATIVE mg/dL (ref 7–25)

## 2019-12-14 LAB — COVID-19 (SARS-COV-2) PCR

## 2019-12-14 LAB — URINALYSIS DIPSTICK REFLEX TO CULTURE
Lab: NEGATIVE
Lab: NEGATIVE
Lab: NEGATIVE
Lab: NEGATIVE
Lab: NEGATIVE

## 2019-12-14 LAB — CANNABINOIDS-URINE RANDOM: Lab: NEGATIVE mg/dL (ref 8.5–10.6)

## 2019-12-14 LAB — TSH WITH FREE T4 REFLEX: Lab: 0.7 uU/mL (ref 0.35–5.00)

## 2019-12-14 LAB — ALCOHOL LEVEL: Lab: <10 mg/dL (ref 98–110)

## 2019-12-14 LAB — VALPROIC ACID LEVEL: Lab: 70 ug/mL (ref 50.0–100.0)

## 2019-12-14 LAB — OXYCODONE URINE SCREEN: Lab: NEGATIVE U/L (ref 25–110)

## 2019-12-14 LAB — OPIATES 300 OR GREATER-URINE RANDOM: Lab: NEGATIVE U/L (ref 7–40)

## 2019-12-14 LAB — PHENCYCLIDINES-URINE RANDOM: Lab: NEGATIVE mg/dL (ref 0.3–1.2)

## 2019-12-14 LAB — COMPREHENSIVE METABOLIC PANEL
Lab: 144 MMOL/L (ref 137–147)
Lab: 27 MMOL/L (ref 21–30)

## 2019-12-14 LAB — BENZODIAZEPINES-URINE RANDOM: Lab: NEGATIVE mg/dL (ref 0.4–1.24)

## 2019-12-14 LAB — AMPHETAMINES-URINE RANDOM: Lab: NEGATIVE mg/dL (ref 70–100)

## 2019-12-14 LAB — METHADONE-URINE SCREEN: Lab: NEGATIVE g/dL (ref 3.5–5.0)

## 2019-12-14 LAB — COCAINE-URINE RANDOM: Lab: NEGATIVE g/dL (ref 6.0–8.0)

## 2019-12-14 LAB — CBC AND DIFF: Lab: 10 10*3/uL (ref 4.5–11.0)

## 2019-12-14 MED ORDER — PANTOPRAZOLE 40 MG PO TBEC
40 mg | Freq: Every day | ORAL | 0 refills | Status: DC
Start: 2019-12-14 — End: 2019-12-23
  Administered 2019-12-15 – 2019-12-23 (×9): 40 mg via ORAL

## 2019-12-14 MED ORDER — OLANZAPINE 5 MG PO TBDI
5 mg | ORAL | 0 refills | Status: DC | PRN
Start: 2019-12-14 — End: 2019-12-23
  Administered 2019-12-15 – 2019-12-20 (×2): 5 mg via ORAL

## 2019-12-14 MED ORDER — TRAZODONE 50 MG PO TAB
50 mg | Freq: Every evening | ORAL | 0 refills | Status: DC | PRN
Start: 2019-12-14 — End: 2019-12-23
  Administered 2019-12-15 – 2019-12-22 (×3): 50 mg via ORAL

## 2019-12-14 MED ORDER — POLYETHYLENE GLYCOL 3350 17 GRAM PO PWPK
1 | Freq: Every day | ORAL | 0 refills | Status: DC | PRN
Start: 2019-12-14 — End: 2019-12-23

## 2019-12-14 MED ORDER — NICOTINE (POLACRILEX) 4 MG BU GUM
4 mg | BUCCAL | 0 refills | Status: DC | PRN
Start: 2019-12-14 — End: 2019-12-23
  Administered 2019-12-14 – 2019-12-23 (×24): 4 mg via BUCCAL

## 2019-12-14 MED ORDER — CALCIUM CARBONATE 200 MG CALCIUM (500 MG) PO CHEW
500 mg | ORAL | 0 refills | Status: DC | PRN
Start: 2019-12-14 — End: 2019-12-23

## 2019-12-14 MED ORDER — ACETAMINOPHEN 325 MG PO TAB
650 mg | ORAL | 0 refills | Status: DC | PRN
Start: 2019-12-14 — End: 2019-12-23
  Administered 2019-12-17: 15:00:00 650 mg via ORAL

## 2019-12-14 MED ORDER — ARIPIPRAZOLE 10 MG PO TAB
20 mg | Freq: Every day | ORAL | 0 refills | Status: DC
Start: 2019-12-14 — End: 2019-12-14

## 2019-12-14 MED ORDER — ARIPIPRAZOLE 15 MG PO TAB
15 mg | Freq: Every day | ORAL | 0 refills | Status: DC
Start: 2019-12-14 — End: 2019-12-14

## 2019-12-14 MED ORDER — DIVALPROEX 500 MG PO TB24
1500 mg | Freq: Every evening | ORAL | 0 refills | Status: DC
Start: 2019-12-14 — End: 2019-12-18
  Administered 2019-12-15 – 2019-12-18 (×4): 1500 mg via ORAL

## 2019-12-14 MED ORDER — ARIPIPRAZOLE 10 MG PO TAB
20 mg | Freq: Every day | ORAL | 0 refills | Status: DC
Start: 2019-12-14 — End: 2019-12-16
  Administered 2019-12-14 – 2019-12-16 (×3): 20 mg via ORAL

## 2019-12-14 MED ORDER — DIPHENHYDRAMINE HCL 50 MG/ML IJ SOLN
50 mg | INTRAMUSCULAR | 0 refills | Status: DC | PRN
Start: 2019-12-14 — End: 2019-12-23

## 2019-12-14 MED ORDER — NICOTINE 14 MG/24 HR TD PT24
1 | Freq: Every day | TRANSDERMAL | 0 refills | Status: DC
Start: 2019-12-14 — End: 2019-12-14

## 2019-12-14 MED ORDER — CARVEDILOL 6.25 MG PO TAB
3.125 mg | Freq: Two times a day (BID) | ORAL | 0 refills | Status: DC
Start: 2019-12-14 — End: 2019-12-23
  Administered 2019-12-14 – 2019-12-23 (×19): 3.125 mg via ORAL

## 2019-12-14 MED ORDER — OLANZAPINE 10 MG IM SOLR
5 mg | INTRAMUSCULAR | 0 refills | Status: DC | PRN
Start: 2019-12-14 — End: 2019-12-23

## 2019-12-14 MED ORDER — FLU VACC QS2020-21 6MOS UP(PF) 60 MCG (15 MCG X 4)/0.5 ML IM SYRG
.5 mL | Freq: Once | INTRAMUSCULAR | 0 refills | Status: CP
Start: 2019-12-14 — End: ?
  Administered 2019-12-15: 17:00:00 0.5 mL via INTRAMUSCULAR

## 2019-12-14 NOTE — Group Note
Name: Jamie [name]        MRN: [id number]          DOB: 08-09-1996          Age: 24 y.o.  Admission Date: 12/14/2019             LOS: 0 days      Group Topic: BH Self-Care  Group Date: 12/14/2019  Start Time: 1130  End Time: 1230  Facilitators: Anne Fu          Number of Participants: 5  Group Focus: acceptance, anxiety, coping skills, self-awareness, and self-esteem  Treatment Modality: Patient-Centered Therapy, Psychoeducation, and Skills Training  Interventions utilized were exploration, group exercise, and patient education  Purpose: explore maladaptive thinking, increase insight, and reinforce self-care    TH and group participants explored different kinds of stress and identified how it affects mental health. TH provided psycho education on self care and provided worksheet on self care tips. Group participants identified ways to enhance self care while in hospital setting.          Name: Jamie [name] Date of Birth: 1996/01/27   MR: [id number]      Level of Participation: active   Quality of Participation: attentive, distractible, distracting to others and intrusive  Interactions with others: gave feedback and monopolizing  Mood/Affect: appropriate, manic and positive  Triggers (if applicable):   Cognition: tangential  Progress: Minimal  Response:   Plan: to continue treatment

## 2019-12-14 NOTE — ED Notes
Called [facility] and spoke with Susa Raring. Pt has been placed on list for review. Two consent for treatment forms have been sent to [facility], one signed by pt and the other signed by his father and legal guardian Kein Carlberg. Rudell Cobb was informed to bring in legal paperwork when he comes to visit.

## 2019-12-14 NOTE — Group Note
Name: Farhan Jean        MRN: [id number]          DOB: December 31, 1995          Age: 24 y.o.  Admission Date: 12/14/2019             LOS: 0 days      Group Topic: BH What is Mental Health and Mental Illness?  Group Date: 12/14/2019  Start Time: 1530  End Time: 1630  Facilitators: Jeri Cos          Number of Participants: 1  Group Focus: acceptance, activities of daily living skills, affirmation, anger management, anxiety, clarity of thought, concentration, coping skills, and daily focus  Treatment Modality: Behavior Modification Therapy, Cognitive Behavioral Therapy, Family Therapy, Individual Therapy, Music Therapy, Psychoeducation, Recreation Therapy, Skills Training, and Solution-Focused Therapy  Interventions utilized were assignment, clarification, confrontation, mental fitness, orientation, other yoga tools on breathing and meditation, patient education, reminiscence, story telling, and support  Purpose: enhance coping skills, express feelings, increase insight, regain self-worth, and reinforce self-care          Name: Tylan Date of Birth: 19-Apr-1996   MR: [id number]      Level of Participation: active   Quality of Participation: attention seeking, attentive, cooperative, defensive and restless  Interactions with others: gave feedback  Mood/Affect: bright, depressed and tearful  Triggers (if applicable): unknown emotional out bursts  Cognition: confused, processing slowly and religious preoccupation  Progress: Significant  Response: Patient participated in a 1:1 Yoga and meditation therapy class on the unit and was very focused throughout.  Yoga Therapy and Meditation Class is structured to be an introductory/advanced class explaining the yoga system and its benefits and powers with utterances of yoga sutras and mnemonic sounds and about the various chakras and its properties, colors and passwords with locations in the Body.  Also touched the basics on Kundalini yoga and HaTa yoga including topics on variety of breathing techniques.  Patient was seen to be having lot of emotional outbursts at the time of attending the class.     Patient was put into the suitable relaxation posture on the chair/mat for practicing related supine posture and techniques closing the eyes and explained the positions of all the focal points on the Subtle body controlling the main glands and the process of activating the energy centers.   The patient responded in an amazing way. The patient is well focussed, motivated and very good student who wants to seek the yoga knowledge.  Class was amazing to the full satisfaction to the teacher.  No dysregulation was observed in the class which was uninterrupted till the end and patient appreciated the yoga class and thanked the teacher as a feedback informing that the yoga class was very relaxing.    Written feedback,??s yoga class was very useful and gave me immense peace.  He is an Mudlogger? ~ Izora Gala    Plan: follow-up needed and to continue treatment

## 2019-12-14 NOTE — Progress Notes
PSYCHIATRIC NURSING ADMISSION ASSESSMENT     NAME:Jamie [name]             MRN: [id number]             DOB:11/20/1995          AGE: 24 y.o.  ADMISSION DATE: 12/14/2019             DAYS ADMITTED: LOS: 0 days    Patient presents to the unit from: [facility] Emergency Department    Patient states the reason for admission is I left the hospital 4 days ago and went to see my friend who is wiccan and gave me some crystals. I rubbed them on my body and was healed physically but also experienced something else. I realized that the world is beyond saving. Not individual people but leadership. I am the white horse, here to rid the world of evil with my double edge sword and show the people the way.  Pt is seen and medically cleared at [facility] ED and transported via [facility] to [facility] without incident.   Upon arrival pt presented AOx3, ambulatory, calm, congruent but tangential and cooperative with intake assessment. Pt rapidly cycles from calm and pleasant instances to tearful and sad moods. Pt denies any current SI but reports that he is experiencing HI and A/VH and anxiety 10/10. Pt denies any depression and reports actually, Im happy. Pt is able to contract for safety while here and states his goal is to relax, find peace and some way to be who I am. Pt seems to be a poor historian and it is unknown if this is intentional. Pts responses during this assessment do not match with previous answers with other assessors. 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 Pt reports seizures and when asked to describe them he is unable to.   Stremel RN completed the skin assessment and reported it to be unremarkable. Pts skin intact and no outward signs of trauma or injury noted. Pt denies any complaints of pain.   Pt states he has a pmhx of cardiomyopathy, HTN and Schizoaffective d/o Bipolar type. Pt states he is compliant with his depakote and abilify and takes no additional medications. Pt states recently he has been experiencing an increase appetite and difficulty sleeping. Pt also reports swallowing 12-14 pages of paper (bibical scrolls) a day and a half ago and has not had a BM since. Pt denies any complaints of abd pain.  Report called to Micha, RN and pt care transferred without incident.      Mental Status Exam  Legal Status: Voluntary Admission  General Appearance: Normal  Mood / Affect: Labile Mood, Congruent  Speech: Excessive amount  Content Of Thought: Excessive Religiosity, Illusions, Other Delusions  Motor Activity: Normal  Flow Of Thought: Tangential, Indecisive, Racing Thoughts  Sensorium: Normal  Insight / Judgment: Poor Judgment, Poor Insight  Behavior: Aloof, Calm, Follows Commands, Cooperative  Patient Strengths: Knowledge of medications, Access to housing/residential stability, Interpersonal relationships and supports, i.e. family, friends, peers              Contraband/Body Checks  Patient Searched: Yes  Belongings Searched: Yes           Jiles Prows, RN  12/14/2019
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
## 2019-12-14 NOTE — Progress Notes
[facility]  Initial Clinical Assessment    NAME:Jamie [name] MRN: [id number] DOB:1996-02-01 AGE: 24 y.o.  ADMISSION DATE: 12/14/2019 DAYS ADMITTED: LOS: 0 days    Date of Service: 12/14/19    Active Problems:    Active Problems:    GERD (gastroesophageal reflux disease)    Cardiomyopathy, idiopathic (HCC)    Essential hypertension    Schizoaffective disorder, bipolar type (HCC)    Alcohol dependence (HCC)    Tobacco use disorder, moderate, dependence       Reason for Admission: Patient is a 24 yo Caucasian male who presented to [facility] for AVH and passive HI towards the leaders of our country.     Patient's Description of Problem: Pt reported he knows he is divine, not fully human and has recently decided to accept that he is The Living Creature who is going to bring on the apocalypse. Pt became teary when discussing bringing the end of the world because he doesn't want to do it. Pt reported he made a drink out of coffee grounds, salt, and sugar as a ritual that he drank. Pt stated family brought him to the ED after Pt's became agitated on the phone with his friend Diane who feared for Pt's safety. Pt reported police showed up because Diane thought he was going to kill himself which Pt denied. Pt currently denies SI and has passive HI towards the leadership of the country and world because he doesn't like them. Pt reported no plans but stated I know I'd get caught building something to hurt them so I wouldn't do it, but if I could get away with it I would.  ?    Recent Changes or Stressors: health and substance use - alcohol     Current Safety Concerns:   Current Safety Concerns - Self: none  Current Safety Concerns - Others: homicidal ideations  Psychoses: auditory hallucinations visual hallucinations  Delusions: religious  Lethal Means: Patient does not report any access to firearms.    Trauma History: Pt denied.    Substance Use:  Current: alcohol (9-12 beers, 3-4x/ month) and cannabinoids (marijuana, K2, hash oil) (unclear)  Past: cocaine (crack) (Pt reported trying 1x and will never again as it kicked off all this craziness in my life.)    Family History:  Family history of mental health diagnosis? Pt reported paternal great uncle had schizophrenia  Family history of substance use? patient denied family history of substance use.    Internal Strengths: At least average intelligence, Employed, Has goals for the future, Some insight and Motivated for treatment    External Supports: supportive friends, family support and has mental health follow up     Limitations: impulsive    Therapy Aftercare Recommendations: Patient would benefit from case management, outpatient individual counseling and Alcoholics Anonymous groups to address ongoing mental health concerns made worse by substance use.    Mental Status Exam    Observations:  Appearance: neat  Speech: tangential  Eye Contact: intense    Behavior: calm / cooperative    Mood: depressed  Affect: Labile    Cognition:  Orientation Impairment: no noted impairment  Memory Impairment: no noted impairment    Thought Process: tangential    Insight: Poor  Judgment: Poor    Comments: Pt also stated he lives alone in his apartment in [place], [place] where he maintains two jobs. Pt reported he has outpatient services at [facility] and has been medically compliant since his most recent stay  at [facility] in February 2021

## 2019-12-14 NOTE — ED Notes
Nurse to nurse done with Gena RN at [facility]. [facility] was contacted for transport. ETA of 30-45 min given. SH notified of ETA.

## 2019-12-14 NOTE — ED Notes
MD medically cleared patient at this time and PLS notified of medical clearance for interview.

## 2019-12-14 NOTE — Progress Notes
Case Manager Initial Assessment    Comments: Patient is a 24 yo Caucasian male who presented to [facility] for AVH and passive HI towards the leaders of our country. Pt reported he knows he is divine, not fully human and has recently decided to accept that he is The Living Creature who is going to bring on the apocalypse. Pt became teary when discussing bringing the end of the world because he doesn't want to do it. Pt reported he made a drink out of coffee grounds, salt, and sugar as a ritual that he drank. Pt stated family brought him to the ED after Pt's became agitated on the phone with his friend Diane who feared for Pt's safety. Pt reported police showed up because Diane thought he was going to kill himself which Pt denied. Pt currently denies SI and has passive HI towards the leadership of the country and world because he doesn't like them. Pt reported no plans but stated I know I'd get caught building something to hurt them so I wouldn't do it, but if I could get away with it I would.    Pt reported drinking 9-12 beers, 3-4x a month, smoking marijuana occasionally, and trying cocaine once which he stated he would never do again as it kicked off all the craziness in my life.  Pt also stated he lives alone in his apartment in [place], [place] where he maintains two jobs. Pt reported he has outpatient services at [facility] and has been medically compliant since his most recent stay at [facility] in February 2021.           ____________________________________________________________________________________________________________________________________________    Reason for admission: Patient is a 24 yo Caucasian male who presented to [facility] for AVH and passive HI towards the leaders of our country.      Living Situation: Lives alone in an apartment in [place], [place].     Guardian/Involuntary:     Placement upon discharge  Patient's comments: Plans to DC to apartment.       Significant Supports (significant others, parents, adult children, friends, spiritual community):  Name: Diane  Solicitor information: # in phone  Relationship: Friend      Current Mental Health and Social Services: Yes  Psychiatrist: at [facility]  Therapist: Dr. 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

## 2019-12-14 NOTE — Care Plan
Pt arrived to unit @ 0815 via wheelchair accompanied by intake staff.  Pt is familiar with this unit.  Pt ambulated to dining room for breakfast.  Upon further assessment following breakfast, Pt is noted to be psychotic and delusional with a preoccupation towards religion.  Pt is convinced he is the chosen one to bring an end to all the hate and evil in the world.   Denies SI AVH at this time.  Endorses HI towards politicians and hateful people.  Pt is redirectable but only to the extent of calming him down.      Problem: Discharge Planning  Goal: Participation in plan of care  Outcome: Goal Ongoing  Goal: Knowledge regarding plan of care  Outcome: Goal Ongoing  Goal: Prepared for discharge  Outcome: Goal Ongoing     Problem: Harm to self, low risk of suicide  Goal: Absence of harm to self, low risk of suicide  Outcome: Goal Ongoing     Problem: Thought Process - Altered  Goal: Demonstration of organized thought processes  Outcome: Goal Ongoing  Goal: Knowledge of altered thought process  Outcome: Goal Ongoing     Problem: Violence, self/other-directed, Risk of  Goal: Absence of violence  Outcome: Goal Ongoing  Goal: Knowledge of risk for violence, self/other-directed  Outcome: Goal Ongoing

## 2019-12-14 NOTE — ED Notes
Psychiatric Liaison Services Evaluation:    Name: Jamie [name]        MRN: [id number]          DOB: [birth year]          Age: 24 y.o.  Admission Date: 12/14/2019             LOS: 0 days      Gender:  Male  Referred by:  ER   Accompanied by: with dad     Chief Complaint:  Mania/delusional/psychosis     History of Present Illness:  Pt is a 24 yo caucasian male who was brought to [facility] tonight by his father for psychosis. Pt presents tonight with delusional thought process, rapid and pressured speech, labile mood. Pt explains that when he left [facility] on 12/09/2019 he met with a previous coworker from [place]. He says that this person practices witchcraft and she gave him quartz crystals which were supposed to have healing powers. He says that if you rubbed this crystals on your body that it was supposed to heal you. He states that when he did that he felt a change happen. Pt reports that after he performed this ritual that he began to have obsessive thoughts about the occult. He says that he became very hyper religious focused and was trying to make connections between various topics he was researching. During the interview pt appeared to be rapid cycling between being very tearful and having an elevated mood. Pt does currently deny any SI and AH. He does endorse having VH which are mostly of seeing colors and starts. When asked if was having HI pt reports that he does but only towards politicians. Pt reports having decreased sleep of up to a few hours a day. He states that he cannot differentiate between the days, that they are all kind of mixed together. He also reports a decrease in appetite. Pt denies using any illicit drugs, and stated he drank once when he first got out of the hospital. Pt does use tobacco. Pt denies access to guns. No current legal issues, no somatic complaints. He does state that he has been medication compliant since being discharged from [facility] on 12/09/2019. He feels that the medications are not effective. Pt requesting voluntary admission to [facility]. His father who is legal guardian has signed the consent for treatment form.     Psych History: schizoaffective disorder, BMD. ETOH dependence, tobacco use disorder. Pt with multiple admissions to [facility] inpatient psychiatry. Pt was just discharged on 12/09/2019 from [facility].     Past Medical History:   Medical History:   Diagnosis Date   ? Alcohol dependence (HCC) 06/24/2019   ? Myocarditis (HCC)    ? Schizoaffective disorder, bipolar type (HCC)    ? Tobacco use disorder, moderate, dependence 12/04/2019       Past Surgical History: History reviewed. No pertinent surgical history.    Substance Abuse History:  Social History     Tobacco Use   ? Smoking status: Never Smoker   ? Smokeless tobacco: Current User     Types: Chew   Substance Use Topics   ? Alcohol use: Yes     Comment: 1-2 x/week   ? Drug use: Yes     Types: Marijuana     Comment: On occassion     Social History     Substance and Sexual Activity   Drug Use Yes   ? Types: Marijuana    Comment: On occassion  Family History:  Family History   Problem Relation Age of Onset   ? Schizophrenia Maternal Uncle        Allergies:  Allergies   Allergen Reactions   ? Clozapine SEE COMMENTS     myocarditis   ? Haldol [Haloperidol Lactate] SEE COMMENTS     lethargic       Medications:    No current facility-administered medications on file prior to encounter.      Current Outpatient Medications on File Prior to Encounter   Medication Sig Dispense Refill   ? ARIPiprazole (ABILIFY) 15 mg tablet Take one tablet by mouth daily. 30 tablet 0   ? divalproex (DEPAKOTE ER) 500 mg ER tablet Take three tablets by mouth at bedtime daily. Take with food. 90 tablet 0   ? loperamide (IMODIUM A-D) 2 mg capsule Take 2 capsules by mouth initially, followed by 1 capsule by mouth after each loose stool up to a maximum of 8 tablets in 24 hours. 30 capsule    ? melatonin 5 mg tab Take one tablet by mouth at bedtime as needed.         Social History: Marital Status: Single    Living Situation: Lives independently    Mental Status Examination:  Flow Of Thought: Materials engineer, Tangential  Intellect: Normal  Sensorium: Normal  Memory: Normal  Insight / Judgment: Fair Judgment, Fair Insight  General Appearance: Expressionless  Behavior: Cooperative, Aloof  Motor Activity: Normal  Speech: Excessive amount  Mood / Affect: Labile Mood  Content Of Thought: Denies SI, Denies Audio Hallucinations, Hallucinations (Describe), Delusions (Describe), Homicidal Thoughts(reports only towards polictians)        Conduct Disturbance: Conduct Appropriate  Eating/Sleep Disturbance: Decreased Appetite, Insomnia  Interview Behavior: Normal  Med/[place] Compliance: Meds-always    Suicide Risk Initial Screening:  Suicide Risk - [place] Suicide Severity Rating Scale  1. In the past month have you wished you were dead or wished you could go to sleep and not wake up?: Yes  2. In the past month have you actually had any thoughts of killing yourself?: Yes  3. In the past month have you been thinking about how you might kill yourself?: Yes  4. In the past month have you had these thoughts and had some intention of acting on them?: Yes  5. In the past month have you started to work out or worked out the details of how to kill yourself? Do you intend to carry out this plan?: No  6. In your lifetime have you ever done anything, started to do anything, or prepared to do anything to end your life?: No  Suicide Risk Level: High Risk :     Suicide Risk Re-Screening:  @FLOW (16109)    Suicide Risk Assesssment: low     Disposition: pending medical clearance. Pt is wanting voluntary admission to [facility]. Father is his legal guardian.     Reviewed: Dr. Allayne Butcher     Attending Physician:  Dr. Melida Gimenez

## 2019-12-14 NOTE — ED Notes
Pt has been accepted to [facility] by Dr. Para March. Pt will be going to [facility], attending will be Dr. Para March. Pt needs Depakote level drawn while in ED. Waiting on nurse to nurse.

## 2019-12-14 NOTE — Consults
Internal Medicine Initial Consult Note      Admission Date: 12/14/2019                                                LOS: 0 days    Reason for Consult: Medical Management     Consult type: Recommendations with Orders     Assessment  24 y.o. adult who  has a past medical history of Alcohol dependence (HCC) (06/24/2019), Myocarditis (HCC), Schizoaffective disorder, bipolar type (HCC), and Tobacco use disorder, moderate, dependence (12/04/2019). is now admitted to [facility] with the following medical issues:  Active Problems:    GERD (gastroesophageal reflux disease)    Cardiomyopathy, idiopathic (HCC)    Essential hypertension    Schizoaffective disorder, bipolar type (HCC)    Alcohol dependence (HCC)    Tobacco use disorder, moderate, dependence      Recommendations:    Hypertension:  -Uncontrolled hypertension.  Outside records indicate that patient was on carvedilol, 3.125 mg twice daily  -We will restart carvedilol at above dose and adjust as needed    GERD  -Patient was on PPI prior to admission.    -We will go ahead and start daily PPI    Cardiomyopathy, idiopathic (HCC)  -Patient last had an echocardiogram on 07/03/2018.  EF 60 to 65%, with normal ventricular size and function and no regional wall motion abnormalities.  He had no pericardial effusion with normal estimated pulmonary systolic pressures.  -This was probably cardiomyopathy secondary to myocarditis which is currently resolved.    Other care issues:  ? Continued admission to inpatient psych. Will defer to primary team on psychiatric treatment and pain management  ? Ensure hydration, bowel regimen if needed  ? DVT prophylaxis if non-ambulatory  ? AWAS protocol, Thiamine, as appropriate for alcohol history  ? Records from previous encounters where available have been reviewed.   ? Radiology, imaging, labs where available were reviewed.    ? Remains stable from a medical standpoint. We will follow    The patient is admitted to inpatient psychiatric unit.  The inpatient psychiatric team is following.  Internal medicine team was consulted for medical evaluation.  I reviewed patient medical history, vitals, medications, labs and available medical records  .  Thanks for the consult , the patient has active medical issues that need monitoring and we will follow with you    Internal medicine can be reached out by Looking up [name] on Voalte or Cureatr app for direct app communication    or by paging [phone]      Rudi Coco, MD   Pager [phone]   ______________________________________________________________________      History of Present Illness: Jamie [name] is a 24 y.o. adult admitted to [facility] for Psychiatric complaint [F69].  She/he  has a past medical history of Alcohol dependence (HCC) (06/24/2019), Myocarditis (HCC), Schizoaffective disorder, bipolar type (HCC), and Tobacco use disorder, moderate, dependence (12/04/2019).. We have been consulted to evaluate the patient and make recommendations for medical management. He reports minimal ongoing problems or symptoms.  Denies headaches, dizziness or visual problems, and denies sore throat or cough.  Denies chest pain, palpitations, or orthopnea. He reports a normal appetite with no nausea or vomiting, and no abdominal pain. He endorses occasional constipation, but denies bloody or tarry stools. He has no urinary complaints,  no dysuria or flank pain.  He does have a history of hypertension and cardiomyopathy. He says he was told that he has a viral myocarditis, but he believes that it was due to clozapine. He has been told that his heart has healed back to normal. He did not suffer any palpitations, chest pain, orthopnea or paroxysmal internal dyspnea.  He has no LE swelling or cough.  .  Medical History:   Diagnosis Date   ? Alcohol dependence (HCC) 06/24/2019   ? Myocarditis (HCC)    ? Schizoaffective disorder, bipolar type (HCC)    ? Tobacco use disorder, moderate, dependence 12/04/2019     History reviewed. No pertinent surgical history.  Social History     Socioeconomic History   ? Marital status: Single     Spouse name: Not on file   ? Number of children: Not on file   ? Years of education: Not on file   ? Highest education level: Not on file   Occupational History   ? Not on file   Social Needs   ? Financial resource strain: Not on file   ? Food insecurity     Worry: Not on file     Inability: Not on file   ? Transportation needs     Medical: Not on file     Non-medical: Not on file   Tobacco Use   ? Smoking status: Never Smoker   ? Smokeless tobacco: Current User     Types: Chew   Substance and Sexual Activity   ? Alcohol use: Yes     Comment: 1-2 x/week   ? Drug use: Yes     Types: Marijuana     Comment: On occassion   ? Sexual activity: Not on file   Lifestyle   ? Physical activity     Days per week: Not on file     Minutes per session: Not on file   ? Stress: Not on file   Relationships   ? Social Wellsite geologist on phone: Not on file     Gets together: Not on file     Attends religious service: Not on file     Active member of club or organization: Not on file     Attends meetings of clubs or organizations: Not on file     Relationship status: Not on file   ? Intimate partner violence     Fear of current or ex partner: Not on file     Emotionally abused: Not on file     Physically abused: Not on file     Forced sexual activity: Not on file   Other Topics Concern   ? Military Service No   ? Blood Transfusions No   ? Caffeine Concern No   ? Occupational Exposure No   ? Hobby Hazards No   ? Sleep Concern Not Asked   ? Stress Concern Not Asked   ? Weight Concern No   ? Special Diet No   ? Back Care No   ? Exercise No   ? Bike Helmet Not Asked   ? Seat Belt Not Asked   ? Self-Exams Not Asked   Social History Narrative   ? Not on file     Family history reviewed; non-contributory    Allergies:  Clozapine and Haldol [haloperidol lactate]    Scheduled Meds:ARIPiprazole (ABILIFY) tablet 15 mg, 15 mg, Oral, QDAY  divalproex (DEPAKOTE ER) ER tablet 1,500 mg,  1,500 mg, Oral, QHS  influenza (=>6 MO)(QUADrivalent) (FLULAVAL) 60 mcg (15 mcg x 4)/0.5 mL 2020-21 PF syringe 0.5 mL, 0.5 mL, Intramuscular, ONCE  nicotine (NICODERM CQ STEP 2) 14 mg/day patch 1 patch, 1 patch, Transdermal, QDAY    Continuous Infusions:  PRN and Respiratory Meds:acetaminophen Q6H PRN, calcium carbonate Q2H PRN, diphenhydrAMINE Q6H PRN, OLANZapine Q6H PRN, OLANZapine Q6H PRN, polyethylene glycol 3350 QDAY PRN, traZODone QHS PRN    Review of Systems:  A complete review of systems was done, and sent as detailed in the HPI.  A 14 point review of systems was negative except for: Constitutional: positive for fatigue  Behavioral/Psych: positive for behavior problems   Other ROS as described in the HPI.      Vital Signs:  Last Filed in 24 hours Vital Signs:  24 hour Range    BP: 138/66 (02/13 0722)  Temp: 36.6 ?C (97.9 ?F) (02/13 1610)  Pulse: 80 (02/13 0722)  Respirations: 16 PER MINUTE (02/13 0722)  SpO2: 98 % (02/13 0722)  SpO2 Pulse: 78 (02/13 0643)  Height: 175.3 cm (69) (02/13 0722) BP: (115-138)/(60-80)   Temp:  [36.6 ?C (97.9 ?F)-36.9 ?C (98.5 ?F)]   Pulse:  [70-80]   Respirations:  [16 PER MINUTE-18 PER MINUTE]   SpO2:  [94 %-99 %]      Physical Exam:  Gen: alert and oriented, cooperative and no distress   Head: Normocephalic, without obvious abnormality, atraumatic   Eyes: conjunctivae/corneas clear. PERRL, EOM's intact,   Throat: Lips, mucosa, and tongue normal. Teeth and gums normal   Neck: supple, symmetrical, trachea midline, no adenopathy, no JVD, no bruits,   Lungs: clear to auscultation bilaterally, no wheezes, rales, rhonchi   Heart: regular rate and rhythm, S1, S2 normal, no murmur, click, rub or gallop   Abdomen: soft, non-tender. Bowel sounds normal. No masses, no organomegaly, non-distended   Extremities: pulses palpable, no pedal edema or skin lesions   Neurologic: Grossly normal  Lymph nodes: Cervical, supraclavicular nodes normal.    Lab Review  Hematology:    Lab Results   Component Value Date    HGB 14.6 12/14/2019    HCT 40.6 12/14/2019    PLTCT 212 12/14/2019    WBC 10.2 12/14/2019    NEUT 61 12/14/2019    ANC 6.30 12/14/2019    LYMPH 6 12/01/2014    ALC 2.78 12/14/2019    MONA 8 12/14/2019    AMC 0.81 12/14/2019    ABC 0.19 12/14/2019    MCV 87.4 12/14/2019    MCHC 36.0 12/14/2019    MPV 8.6 12/14/2019    RDW 12.6 12/14/2019   , General Chemistry:    Lab Results   Component Value Date    NA 144 12/14/2019    K 3.7 12/14/2019    CL 107 12/14/2019    GAP 10 12/14/2019    BUN 19 12/14/2019    CR 1.03 12/14/2019    GLU 92 12/14/2019    GLU 76 11/04/2011    CA 9.2 12/14/2019    ALBUMIN 4.2 12/14/2019    OBSCA 1.14 12/03/2014    MG 2.3 05/06/2019    TOTBILI 0.3 12/14/2019   , HgbA1C:   Lab Results   Component Value Date    HGBA1C 5.1 05/06/2019   , and Lipid Profile:   Lab Results   Component Value Date    CHOL 164 05/06/2019    TRIG 165 05/06/2019    HDL 46 05/06/2019    LDL 101  05/06/2019    VLDL 33 05/06/2019       Point of Care Testing  (Last 24 hours)  Glucose: 92 (12/14/19 0205)    Radiology and other Diagnostics Review:    Pertinent radiology reviewed.    Rudi Coco, MD  Available on Voalte or Cureatr app under [facility]    pager [phone]
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
## 2019-12-15 NOTE — Progress Notes
Chart check completed.

## 2019-12-16 MED ORDER — IMS MIXTURE TEMPLATE
25 mg | Freq: Every day | ORAL | 0 refills | Status: DC
Start: 2019-12-16 — End: 2019-12-20
  Administered 2019-12-17 – 2019-12-20 (×8): 25 mg via ORAL

## 2019-12-18 MED ORDER — DIVALPROEX 500 MG PO TB24
2000 mg | Freq: Every evening | ORAL | 0 refills | Status: DC
Start: 2019-12-18 — End: 2019-12-23
  Administered 2019-12-19 – 2019-12-23 (×5): 2000 mg via ORAL

## 2019-12-18 MED ORDER — ARIPIPRAZOLE 10 MG PO TAB
25 mg | ORAL_TABLET | Freq: Every day | ORAL | 0 refills | Status: CN
Start: 2019-12-18 — End: ?

## 2019-12-18 MED ORDER — MELATONIN 3 MG PO TAB
6 mg | Freq: Every evening | ORAL | 0 refills | Status: DC | PRN
Start: 2019-12-18 — End: 2019-12-23
  Administered 2019-12-19 – 2019-12-23 (×3): 6 mg via ORAL

## 2019-12-20 ENCOUNTER — Encounter: Admit: 2019-12-20 | Discharge: 2019-12-20

## 2019-12-20 MED ORDER — ARIPIPRAZOLE 15 MG PO TAB
30 mg | Freq: Every day | ORAL | 0 refills | Status: DC
Start: 2019-12-20 — End: 2019-12-23
  Administered 2019-12-21 – 2019-12-23 (×3): 30 mg via ORAL

## 2019-12-21 NOTE — Telephone Encounter
1st attempted follow up phone call, no answer, message left requesting call back.

## 2019-12-23 MED ORDER — DIVALPROEX 500 MG PO TB24
2000 mg | ORAL_TABLET | Freq: Every evening | ORAL | 0 refills | 30.00000 days | Status: AC
Start: 2019-12-23 — End: ?

## 2019-12-23 MED ORDER — PANTOPRAZOLE 40 MG PO TBEC
40 mg | ORAL_TABLET | Freq: Every day | ORAL | 0 refills | 90.00000 days | Status: AC
Start: 2019-12-23 — End: ?

## 2019-12-23 MED ORDER — CARVEDILOL 3.125 MG PO TAB
3.125 mg | ORAL_TABLET | Freq: Two times a day (BID) | ORAL | 0 refills | 90.00000 days | Status: AC
Start: 2019-12-23 — End: ?

## 2019-12-23 MED ORDER — TRAZODONE 50 MG PO TAB
50 mg | ORAL_TABLET | Freq: Every evening | ORAL | 0 refills | Status: AC | PRN
Start: 2019-12-23 — End: ?

## 2019-12-23 MED ORDER — ARIPIPRAZOLE 30 MG PO TAB
30 mg | ORAL_TABLET | Freq: Every day | ORAL | 0 refills | Status: AC
Start: 2019-12-23 — End: ?

## 2019-12-23 MED ORDER — NICOTINE (POLACRILEX) 4 MG BU GUM
4 mg | BUCCAL | 0 refills | Status: AC | PRN
Start: 2019-12-23 — End: ?

## 2019-12-29 ENCOUNTER — Encounter: Admit: 2019-12-29 | Discharge: 2019-12-29

## 2019-12-29 NOTE — Telephone Encounter
Confirmation this is the pt the call is intended for (name/DOB): yes     Any questions about DC instructions? None, reports he has followed all f/u plans and has returned to work    Did you have any difficulty filling Rx's? None, reports he has been taking all medications as prescribed     Any questions about follow-up appointments? none    Name of case manager to follow up with: n/a    Did you receive all items at discharge that you came in with?: yes     Any other questions/comments: none

## 2019-12-30 ENCOUNTER — Encounter: Admit: 2019-12-30 | Discharge: 2019-12-30

## 2019-12-31 ENCOUNTER — Encounter: Admit: 2019-12-31 | Discharge: 2019-12-31

## 2019-12-31 NOTE — Telephone Encounter
No answer, no message left

## 2020-11-14 ENCOUNTER — Encounter: Admit: 2020-11-14 | Discharge: 2020-11-14 | Payer: MEDICARE

## 2020-11-14 NOTE — Telephone Encounter
Yeah, Sounds like this guy needs to go to the ED Right away if this behavior continues
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
## 2020-11-21 ENCOUNTER — Encounter: Admit: 2020-11-21 | Discharge: 2020-11-21 | Payer: MEDICARE

## 2020-11-21 ENCOUNTER — Emergency Department: Admit: 2020-11-21 | Discharge: 2020-11-21 | Disposition: A | Discharge status: Home / Self Care | Payer: MEDICARE

## 2020-11-21 DIAGNOSIS — F250 Schizoaffective disorder, bipolar type: Secondary | ICD-10-CM

## 2020-11-21 DIAGNOSIS — F1020 Alcohol dependence, uncomplicated: Secondary | ICD-10-CM

## 2020-11-21 DIAGNOSIS — I514 Myocarditis, unspecified: Secondary | ICD-10-CM

## 2020-11-21 DIAGNOSIS — F17200 Nicotine dependence, unspecified, uncomplicated: Secondary | ICD-10-CM

## 2020-11-21 DIAGNOSIS — F259 Schizoaffective disorder, unspecified: Secondary | ICD-10-CM

## 2020-11-21 DIAGNOSIS — F99 Mental disorder, not otherwise specified: Secondary | ICD-10-CM

## 2020-11-21 MED ORDER — ARIPIPRAZOLE 30 MG PO TAB
30 mg | Freq: Once | ORAL | 0 refills | Status: CP
Start: 2020-11-21 — End: ?
  Administered 2020-11-21: 13:00:00 30 mg via ORAL

## 2020-11-21 NOTE — ED Notes
Patient's guardian present at the bedside. Guardian provided with discharge instructions at this time and verbalized understanding. Patient ambulatory out of the ED with a steady gait.

## 2020-11-21 NOTE — ED Notes
Pt presents with CC manic behavior. Pt states he has been working ~80 hours per week and is feeling stressed. Pt states he wants to be evaluated to determine if he needs to be admitted to a facility. Pt states he recognizes his behavior and thoughts as manic and states he feels like he is borderline psychosis. Pt denies any pain or other medical complaints at time of arrival. Pt laying in bed, lowest position, call light in reach.    Belongings:  Field seismologist

## 2020-11-21 NOTE — ED Notes
Report received from Jomarie Longs, [place]. Patient resting in the cart at this time. Patient requests to wait for his breakfast at this time while he waits for his guardian to arrive. Okay with this RN.  Call light in reach.
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
## 2020-11-21 NOTE — Case Management (ED)
SW contacted by Dr. Filomena Jungling to get consent for discharge from pt's legal guardian, Dhiren Azimi.  SW contacted guardian by phone at number listed in EMR.  Guardian reported she had just spoken with another social worker about pt and is aware of discharge.  SW apologized for the multiple calls.  SW updated MD.  SW spoke with Shana PLS SW who confirmed she spoke with family. Please refer to ED Note from [facility] Clinician.     Maryjane Hurter, LMSW  ED Social Work  Voalte Me

## 2020-11-21 NOTE — ED Notes
Meal tray delivered at this time.

## 2020-11-21 NOTE — ED Provider Notes
Jamie [name] is a 25 y.o. adult.    Chief Complaint:  Chief Complaint   Patient presents with   ? Manic Behavior     states he is semi compliate with medication       History of Present Illness:  Patient is a 25 year old male with past medical history of schizoaffective disorder bipolar type and alcohol dependence who comes in today for psych evaluation.  He states that he believes that he is about to become manic and that he has not been compliant with his medication.  He does state that he took trazodone yesterday in an attempt to try and help his mental health, but he has not taken his Abilify for a couple months.  He denies any suicidal or homicidal ideations, but does state that he is angry at a love interest and this makes him wants to [place] a [place].  He denies any drug usage in the past couple of weeks, but does endorse some alcohol usage yesterday.  He states that he follows up with psychiatry at this institution, however he states he has not been the best about staying in contact over the past couple of months.  He denies any systemic complaints.  His body such as chest pain, shortness of breath, headaches, fevers, chills or sore throat.  Conversation with the patient is difficult to maintain, as he is displaying flight of ideas and continually jumps from topic to topic.  He does not appear to be acutely agitated or in a psychotic episode.  He states that he wants to be better about his mental health, and that he wants to try and turn his life around.      History provided by:  Patient      Review of Systems:  Review of Systems   Constitutional: Negative for chills and fever.   HENT: Negative for congestion and sore throat.    Eyes: Negative for photophobia and visual disturbance.   Respiratory: Negative for cough and shortness of breath.    Cardiovascular: Negative for chest pain and palpitations.   Gastrointestinal: Negative for abdominal pain and diarrhea.   Genitourinary: Negative for dysuria.   Musculoskeletal: Negative for back pain and neck pain.   Neurological: Negative for syncope and headaches.   Psychiatric/Behavioral: Positive for agitation. Negative for hallucinations, self-injury and suicidal ideas. The patient is not nervous/anxious.        Allergies:  Clozapine and Haldol [haloperidol lactate]    Past Medical History:  Medical History:   Diagnosis Date   ? Alcohol dependence (HCC) 06/24/2019   ? Myocarditis (HCC)    ? Schizoaffective disorder, bipolar type (HCC)    ? Tobacco use disorder, moderate, dependence 12/04/2019       Past Surgical History:  History reviewed. No pertinent surgical history.    Pertinent medical and surgical history reviewed    Social History:  Social History     Tobacco Use   ? Smoking status: Never Smoker   ? Smokeless tobacco: Current User     Types: Chew   Vaping Use   ? Vaping Use: Some days   Substance Use Topics   ? Alcohol use: Yes     Comment: 1-2 x/week   ? Drug use: Not Currently     Types: Marijuana     Social History     Substance and Sexual Activity   Drug Use Not Currently   ? Types: Marijuana  Family History:  Family History   Problem Relation Age of Onset   ? Schizophrenia Maternal Uncle        Vitals:  ED Vitals    Date and Time T BP P RR SPO2P SPO2 User   11/21/20 0243 36.7 ?C (98.1 ?F) 145/76 -- 18 PER MINUTE 97 95 % ES          Physical Exam:  Physical Exam  Constitutional:       Appearance: Normal appearance.   HENT:      Head: Normocephalic and atraumatic.      Nose: Nose normal.      Mouth/Throat:      Mouth: Mucous membranes are moist.      Pharynx: Oropharynx is clear.   Eyes:      Extraocular Movements: Extraocular movements intact.      Conjunctiva/sclera: Conjunctivae normal.      Pupils: Pupils are equal, round, and reactive to light.   Cardiovascular:      Rate and Rhythm: Normal rate and regular rhythm.      Pulses: Normal pulses.      Heart sounds: Normal heart sounds.   Pulmonary:      Effort: Pulmonary effort is normal.      Breath sounds: Normal breath sounds.   Abdominal:      General: Abdomen is flat.      Palpations: Abdomen is soft.      Tenderness: There is no abdominal tenderness.   Musculoskeletal:      Cervical back: No tenderness.      Right lower leg: No edema.      Left lower leg: No edema.   Lymphadenopathy:      Cervical: No cervical adenopathy.   Skin:     General: Skin is warm and dry.      Capillary Refill: Capillary refill takes less than 2 seconds.      Findings: No erythema.   Neurological:      General: No focal deficit present.      Mental Status: He is alert and oriented to person, place, and time. Mental status is at baseline.   Psychiatric:         Attention and Perception: Attention normal.         Mood and Affect: Mood is elated. Mood is not anxious or depressed. Affect is not tearful.         Speech: Speech is rapid and pressured and tangential.         Behavior: Behavior is not agitated, aggressive or combative. Behavior is cooperative.         Thought Content: Thought content normal. Thought content does not include homicidal or suicidal ideation.         Cognition and Memory: Memory is not impaired.         Judgment: Judgment normal. Judgment is not impulsive or inappropriate.         Laboratory Results:  Labs Reviewed - No data to display       Radiology Interpretation:    No orders to display         EKG:  Not applicable    ED Course:  25 y.o. adult presented to the ED for psychiatric evaluation.  - Patient was evaluated by resident and attending physicians.  - Available records were reviewed.  - Vitals: BP (!) 145/76 (BP Source: Arm, Left Upper)  - Temp 36.7 ?C (98.1 ?F)  - SpO2 95%   -Patient is a  25 year old male with schizoaffective disorder who believes that he is on the verge of a manic episode.  While talking the patient, although he does appear to be hyperactive and have flight of ideas, he does not appear to be in any acute decompensated psychiatric state.  However, PLS was contacted for evaluation and to provide the patient with resources since he states he would like to get better about his health.  Abilify was ordered based on his home dose.  -Labs: Not applicable  - Imaging: Not applicable  -After psychiatric liaison service evaluation, they believe that the patient is not in acute threat to himself or others.   they have set him up with a follow-up on Monday with the psychiatric service.  Patient's legal guardian were also contacted regarding this follow-up, and they were in agreement with this plan.  After discussion with the patient, he also states that he agrees with the PLS assessment, and would like to go to the psychiatric service on Monday.  All of his questions were answered.  He was given strict return precautions if he were to feel worse, or have suicidal or homicidal ideations.  At that time, he is comfortably being discharged     - Findings and plan for disposition discussed with patient. Patient understands and agrees with plan.   Dispo: Discharge           ED Scoring:                           Coding    Facility Administered Meds:  Medications - No data to display      Clinical Impression:  Clinical Impression   None       Disposition/Follow up  ED Disposition     None        No follow-up provider specified.    Medications:  New Prescriptions    No medications on file       Procedure Notes:  Procedures        Attestation / Supervision:      Perley Jain, MD  Emergency Medicine PGY-1    Willette Cluster, MD

## 2020-11-28 ENCOUNTER — Encounter: Admit: 2020-11-28 | Discharge: 2020-11-28 | Payer: MEDICARE

## 2020-11-28 ENCOUNTER — Inpatient Hospital Stay: Admit: 2020-11-28 | Payer: MEDICARE

## 2020-11-28 DIAGNOSIS — F250 Schizoaffective disorder, bipolar type: Secondary | ICD-10-CM

## 2020-11-28 DIAGNOSIS — I514 Myocarditis, unspecified: Secondary | ICD-10-CM

## 2020-11-28 DIAGNOSIS — F3010 Manic episode without psychotic symptoms, unspecified: Secondary | ICD-10-CM

## 2020-11-28 DIAGNOSIS — R45851 Suicidal ideations: Secondary | ICD-10-CM

## 2020-11-28 DIAGNOSIS — F1020 Alcohol dependence, uncomplicated: Secondary | ICD-10-CM

## 2020-11-28 DIAGNOSIS — F17200 Nicotine dependence, unspecified, uncomplicated: Secondary | ICD-10-CM

## 2020-11-28 LAB — COMPREHENSIVE METABOLIC PANEL
Lab: 1 mg/dL (ref 0.4–1.24)
Lab: 13 mg/dL (ref 7–25)
Lab: 24 MMOL/L (ref 21–30)
Lab: 25 U/L (ref 7–56)

## 2020-11-28 LAB — URINALYSIS DIPSTICK REFLEX TO CULTURE
Lab: NEGATIVE
Lab: NEGATIVE
Lab: NEGATIVE
Lab: NEGATIVE

## 2020-11-28 LAB — SALICYLATE LEVEL

## 2020-11-28 LAB — CBC AND DIFF
Lab: 1.8 K/UL (ref >60)
Lab: 14 g/dL (ref 13.5–16.5)
Lab: 4.7 M/UL (ref 4.4–5.5)
Lab: 8.1 FL (ref 7–11)
Lab: 9.2 K/UL (ref 4.5–11.0)

## 2020-11-28 LAB — VALPROIC ACID LEVEL: Lab: <10 ug/mL — ABNORMAL LOW (ref 50.0–100.0)

## 2020-11-28 LAB — ACETAMINOPHEN LEVEL: Lab: <10 ug/mL — ABNORMAL HIGH (ref <20.1)

## 2020-11-28 LAB — OXYCODONE URINE SCREEN: Lab: NEGATIVE U/L — ABNORMAL LOW (ref 25–110)

## 2020-11-28 LAB — AMPHETAMINES-URINE RANDOM: Lab: NEGATIVE mg/dL (ref 70–100)

## 2020-11-28 LAB — COVID-19 (SARS-COV-2) PCR

## 2020-11-28 LAB — COCAINE-URINE RANDOM: Lab: NEGATIVE g/dL (ref 6.0–8.0)

## 2020-11-28 LAB — CREATINE KINASE-CPK: Lab: 142 U/L (ref 35–232)

## 2020-11-28 LAB — ALCOHOL LEVEL: Lab: <10 mg/dL (ref 40–50)

## 2020-11-28 LAB — OPIATES 300 OR GREATER-URINE RANDOM: Lab: NEGATIVE U/L (ref 7–40)

## 2020-11-28 LAB — METHADONE-URINE SCREEN: Lab: NEGATIVE % (ref 41–77)

## 2020-11-28 LAB — CANNABINOIDS-URINE RANDOM: Lab: NEGATIVE mg/dL (ref 8.5–10.6)

## 2020-11-28 MED ORDER — LORAZEPAM 1 MG PO TAB
1 mg | ORAL | 0 refills | Status: DC | PRN
Start: 2020-11-28 — End: 2020-11-29
  Administered 2020-11-29: 01:00:00 1 mg via ORAL

## 2020-11-28 MED ORDER — LORAZEPAM 2 MG/ML IJ SOLN
1 mg | INTRAVENOUS | 0 refills | Status: DC | PRN
Start: 2020-11-28 — End: 2020-11-29

## 2020-11-28 MED ORDER — LORAZEPAM 2 MG/ML IJ SOLN
1 mg | INTRAMUSCULAR | 0 refills | Status: DC | PRN
Start: 2020-11-28 — End: 2020-11-29

## 2020-11-28 MED ORDER — OLANZAPINE 10 MG PO TBDI
5 mg | ORAL | 0 refills | PRN
Start: 2020-11-28 — End: ?
  Administered 2020-12-07: 17:00:00 5 mg via ORAL

## 2020-11-28 MED ORDER — POLYETHYLENE GLYCOL 3350 17 GRAM PO PWPK
1 | Freq: Every day | ORAL | 0 refills | PRN
Start: 2020-11-28 — End: ?

## 2020-11-28 MED ORDER — OLANZAPINE 10 MG PO TBDI
10 mg | Freq: Once | ORAL | 0 refills | Status: CP
Start: 2020-11-28 — End: ?
  Administered 2020-11-28: 23:00:00 10 mg via ORAL

## 2020-11-28 MED ORDER — DIVALPROEX 250 MG PO TBEC
500 mg | Freq: Once | ORAL | 0 refills | Status: CP
Start: 2020-11-28 — End: ?
  Administered 2020-11-29: 01:00:00 500 mg via ORAL

## 2020-11-28 MED ORDER — OLANZAPINE 10 MG IM SOLR
5 mg | INTRAMUSCULAR | 0 refills | PRN
Start: 2020-11-28 — End: ?

## 2020-11-28 MED ORDER — CALCIUM CARBONATE 200 MG CALCIUM (500 MG) PO CHEW
1000 mg | Freq: Three times a day (TID) | ORAL | 0 refills | PRN
Start: 2020-11-28 — End: ?

## 2020-11-28 MED ORDER — ACETAMINOPHEN 325 MG PO TAB
650 mg | ORAL | 0 refills | PRN
Start: 2020-11-28 — End: ?

## 2020-11-28 NOTE — ED Provider Notes
Jamie [name] is a 25 y.o. adult.    Chief Complaint:  Chief Complaint   Patient presents with   ? Psych Assessment       History of Present Illness:  Patient is a 25 year old male with past medical history of schizoaffective disorder, bipolar type who presents for worsening mania.  History is provided by the patient and his father bedside.  Patient states he has had worsening mania over the past week, and hypomania for about 4 weeks.  Patient's history is very tangential with rapid and pressured speech, but he is redirectable.  Patient states he states he has been drinking energy drinks, taking caffeine pills, and drinking caffeinated sodas to try to stay awake.  He states I have a business idea that will change the world.  He denies suicidal ideation, homicidal ideation, audiovisual hallucinations.  I spoke to the patient's father in the hall, he stated that he and the patient had trialed outpatient management.  Has been encouraging the patient to take medications, but he is still skipping doses.  They tried to follow-up with their psychiatrist but were unable to get an appointment.  He states that the patient has been increasingly aggressive.  He has not been following through on obligations.  The patient came to his parents house and became very agitated, leading police to be called to the home.  Both the patient and his father are agreeable to inpatient psychiatric admission.          Review of Systems:  Review of Systems   Constitutional: Negative for chills and fever.   HENT: Negative for sore throat.    Eyes: Negative for redness.   Respiratory: Negative for cough and shortness of breath.    Cardiovascular: Negative for chest pain.   Gastrointestinal: Negative for abdominal pain, nausea and vomiting.   Genitourinary: Negative for dysuria.   Musculoskeletal: Negative for back pain.   Skin: Negative for rash.   Neurological: Negative for headaches.   Psychiatric/Behavioral: Positive for agitation, behavioral problems and sleep disturbance. The patient is hyperactive.    All other systems reviewed and are negative.      Allergies:  Clozapine and Haldol [haloperidol lactate]    Past Medical History:  Medical History:   Diagnosis Date   ? Alcohol dependence (HCC) 06/24/2019   ? Myocarditis (HCC)    ? Schizoaffective disorder, bipolar type (HCC)    ? Tobacco use disorder, moderate, dependence 12/04/2019       Past Surgical History:  History reviewed. No pertinent surgical history.    Pertinent medical/surgical history reviewed    Social History:  Social History     Tobacco Use   ? Smoking status: Never Smoker   ? Smokeless tobacco: Current User     Types: Chew   Vaping Use   ? Vaping Use: Some days   Substance Use Topics   ? Alcohol use: Yes     Comment: 1-2 x/week   ? Drug use: Not Currently     Types: Marijuana     Social History     Substance and Sexual Activity   Drug Use Not Currently   ? Types: Marijuana             Family History:  Family History   Problem Relation Age of Onset   ? Schizophrenia Maternal Uncle        Vitals:  ED Vitals    Date and Time T BP P RR SPO2P SPO2 User  11/28/20 1800 -- 151/99 -- -- 87 -- CR   11/28/20 1758 -- -- -- -- 88 100 % CR   11/28/20 1730 -- 144/102 -- -- 101 -- CR   11/28/20 1704 36.8 ?C (98.3 ?F) 153/104 -- 18 PER MINUTE 105 100 % ES          Physical Exam:  Physical Exam  Vitals and nursing note reviewed.   Constitutional:       General: He is not in acute distress.     Appearance: He is well-developed. He is not toxic-appearing.   HENT:      Head: Normocephalic and atraumatic.      Right Ear: External ear normal.      Left Ear: External ear normal.      Mouth/Throat:      Pharynx: No oropharyngeal exudate.   Eyes:      Conjunctiva/sclera: Conjunctivae normal.      Pupils: Pupils are equal, round, and reactive to light.   Neck:      Vascular: No JVD.   Cardiovascular:      Rate and Rhythm: Normal rate and regular rhythm.      Pulses: Normal pulses.      Heart sounds: Normal heart sounds.   Pulmonary:      Effort: Pulmonary effort is normal. No respiratory distress.      Breath sounds: Normal breath sounds. No wheezing.   Abdominal:      General: Abdomen is flat. There is no distension.      Palpations: Abdomen is soft.      Tenderness: There is no abdominal tenderness.   Musculoskeletal:      Cervical back: Normal range of motion.      Right lower leg: No edema.      Left lower leg: No edema.   Skin:     General: Skin is warm and dry.   Neurological:      Mental Status: He is alert.      Cranial Nerves: No cranial nerve deficit.   Psychiatric:         Attention and Perception: He is inattentive.         Mood and Affect: Affect is labile.         Speech: Speech is rapid and pressured and tangential.         Behavior: Behavior is agitated, aggressive and hyperactive.         Thought Content: Thought content does not include homicidal or suicidal ideation.         Judgment: Judgment is impulsive and inappropriate.         Laboratory Results:  Labs Reviewed   CBC AND DIFF - Abnormal       Result Value Ref Range Status    White Blood Cells 9.2  4.5 - 11.0 K/UL Final    RBC 4.77  4.4 - 5.5 M/UL Final    Hemoglobin 14.4  13.5 - 16.5 GM/DL Final    Hematocrit 16.1  40 - 50 % Final    MCV 89.6  80 - 100 FL Final    MCH 30.2  26 - 34 PG Final    MCHC 33.8  32.0 - 36.0 G/DL Final    RDW 09.6  11 - 15 % Final    Platelet Count 281  150 - 400 K/UL Final    MPV 8.1  7 - 11 FL Final    Neutrophils 67  41 - 77 % Final  Lymphocytes 21 (*) 24 - 44 % Final    Monocytes 9  4 - 12 % Final    Eosinophils 2  0 - 5 % Final    Basophils 1  0 - 2 % Final    Absolute Neutrophil Count 6.24  1.8 - 7.0 K/UL Final    Absolute Lymph Count 1.89  1.0 - 4.8 K/UL Final    Absolute Monocyte Count 0.81 (*) 0 - 0.80 K/UL Final    Absolute Eosinophil Count 0.16  0 - 0.45 K/UL Final    Absolute Basophil Count 0.06  0 - 0.20 K/UL Final    MDW (Monocyte Distribution Width) 14.6  <20.7 Final   VALPROIC ACID LEVEL - Abnormal    Valproic Acid <10.0 (*) 50.0 - 100.0 MCG/ML Final   COVID-19 (SARS-COV-2) PCR   COMPREHENSIVE METABOLIC PANEL    Sodium 139  161 - 147 MMOL/L Final    Potassium 3.7  3.5 - 5.1 MMOL/L Final    Chloride 104  98 - 110 MMOL/L Final    Glucose 80  70 - 100 MG/DL Final    Blood Urea Nitrogen 13  7 - 25 MG/DL Final    Creatinine 0.96  0.4 - 1.24 MG/DL Final    Calcium 9.2  8.5 - 10.6 MG/DL Final    Total Protein 6.6  6.0 - 8.0 G/DL Final    Total Bilirubin 0.4  0.3 - 1.2 MG/DL Final    Albumin 4.1  3.5 - 5.0 G/DL Final    Alk Phosphatase 67  25 - 110 U/L Final    AST (SGOT) 15  7 - 40 U/L Final    CO2 24  21 - 30 MMOL/L Final    ALT (SGPT) 25  7 - 56 U/L Final    Anion Gap 11  3 - 12 Final    eGFR >60  >60 mL/min Final   TSH WITH FREE T4 REFLEX    TSH 0.93  0.35 - 5.00 MCU/ML Final   ALCOHOL LEVEL    Alcohol <10  MG/DL Final   AMPHETAMINES-URINE RANDOM    Amphetamines NEG  NEG-NEG Final   BARBITURATES-URINE RANDOM    Barbiturates,Urine NEG  NEG-NEG Final   BENZODIAZEPINES-URINE RANDOM    Benzodiazepines NEG  NEG-NEG Final   CANNABINOIDS-URINE RANDOM    THC NEG  NEG-NEG Final   COCAINE-URINE RANDOM    Cocaine-Urine NEG  NEG-NEG Final   PHENCYCLIDINES-URINE RANDOM    Phencyclidine (PCP) NEG  NEG-NEG Final   METHADONE-URINE SCREEN    Methadone NEG  NEG-NEG Final   OXYCODONE URINE SCREEN    Oxycodone, Urine NEG  NEG-NEG Final   OPIATES 300 OR GREATER-URINE RANDOM    Opiates 300 NEG  NEG-NEG Final   URINALYSIS DIPSTICK REFLEX TO CULTURE    Color,UA STRAW   Final    Turbidity,UA CLEAR  CLEAR-CLEAR Final    Specific Gravity-Urine 1.011  1.005 - 1.030 Final    pH,UA 6.0  5.0 - 8.0 Final    Protein,UA NEG  NEG-NEG Final    Glucose,UA NEG  NEG-NEG Final    Ketones,UA NEG  NEG-NEG Final    Bilirubin,UA NEG  NEG-NEG Final    Blood,UA NEG  NEG-NEG Final    Urobilinogen,UA NORMAL  NORM-NORMAL Final    Nitrite,UA NEG  NEG-NEG Final    Leukocytes,UA NEG  NEG-NEG Final    Urine Ascorbic Acid, UA NEG  NEG-NEG Final   URINALYSIS MICROSCOPIC REFLEX TO CULTURE  WBCs,UA NONE  0 - 2 /HPF Final    RBCs,UA 0-2  0 - 3 /HPF Final    Comment,UA     Final    Value: Criteria for reflex to culture are WBC>10, Positive Nitrite, and/or >=+1   leukocytes. If quantity is not sufficient, an addendum will follow.      MucousUA TRACE   Final   ACETAMINOPHEN LEVEL    Acetaminophen <10.0  <20.1 MCG/ML Final   SALICYLATE LEVEL    Salicylate <2.5  2.0 - 29.0 MG/DL Final   CREATINE KINASE-CPK    Creatine Kinase 142  35 - 232 U/L Final   UA REFLEX LABEL          Radiology Interpretation:  No orders to display         EKG:  Sinus rhythm, no acute ischemic changes, normal intervals, normal axis.      ED Course:  Patient seen and evaluated by resident and attending for mania  Previous records reviewed.  Patient's initial vitals reviewed.   PIV placed, labs drawn, patient on monitor  Patient given ODT Zyprexa.  Labs significant for normal CBC, CMP.  Negative alcohol, Tylenol, salicylate levels.  Normal UDS.  Normal TSH.  UA without infection.  Valproic acid level undetected.  Patient was monitored in the emergency department.  He had minor improvement with the Zyprexa, but continued to be intermittently agitated.  Patient evaluated with the help of psychiatric liaison services.  He agreed to sign-in voluntarily for psychiatric admission.  At this time, patient is clinically appropriate for inpatient psychiatric admission.           ED Scoring:                             Coding    Facility Administered Meds:  Medications   OLANZapine (ZYPREXA ZYDIS) rapid dissolve tablet 10 mg (10 mg Oral Given 11/28/20 1716)         Clinical Impression:  Clinical Impression   Manic behavior (HCC)       Disposition/Follow up  ED Disposition     None        No follow-up provider specified.    Medications:  New Prescriptions    No medications on file       Procedure Notes:  Procedures        Attestation / Supervision:      Garner Nash, MD

## 2020-11-29 ENCOUNTER — Encounter: Admit: 2020-11-29 | Discharge: 2020-11-29 | Payer: MEDICARE

## 2020-11-29 MED ADMIN — NICOTINE (POLACRILEX) 4 MG BU GUM [10718]: 4 mg | BUCCAL | @ 15:00:00 | NDC 00536311337

## 2020-11-29 MED ADMIN — NICOTINE (POLACRILEX) 4 MG BU GUM [10718]: 4 mg | BUCCAL | @ 17:00:00 | NDC 00536311337

## 2020-11-29 MED ADMIN — NICOTINE (POLACRILEX) 4 MG BU GUM [10718]: 4 mg | BUCCAL | @ 20:00:00 | NDC 00536311337

## 2020-11-29 MED ADMIN — ARIPIPRAZOLE 15 MG PO TAB [86967]: 15 mg | ORAL | @ 17:00:00 | NDC 65162089903

## 2020-11-29 MED ADMIN — DIVALPROEX 500 MG PO TB24 [80756]: 1000 mg | ORAL | @ 17:00:00 | Stop: 2020-11-29 | NDC 00904636445

## 2020-11-29 NOTE — ED Notes
Pt A&OX3, calm and cooperative with [facility].

## 2020-11-29 NOTE — Progress Notes
N2N Report:   Name/Title of person providing N2N Information: Cristal Deer [phone]     Allergies   Allergies   Allergen Reactions   ? Clozapine SEE COMMENTS     myocarditis   ? Haldol [Haloperidol Lactate] SEE COMMENTS     lethargic          Most Recent Vitals:  BP 110/60  - Pulse 98  - Temp 36.8 ?C (98.3 ?F)  - SpO2 97%         If Diabetic, FSBS? NA  Pain? NA     Location of Pt: ED/Triage or [facility]     Current Psychiatric Symptoms:  Acute manic behavior, labile, inattentive, states he has been taking caffeine pills and energy drinks.  Has been agitated with reported increasing aggression with parents.     Medical Interventions Needed or PRN's:  1716 Olazepine 10 mg PO  1837 Lorazepam 1 mg PO  Depakote 500 mg PO     Medical Issues and Diagnosis:  Medical History:   Diagnosis Date   ? Alcohol dependence (HCC) 06/24/2019   ? Myocarditis (HCC)    ? Schizoaffective disorder, bipolar type (HCC)    ? Tobacco use disorder, moderate, dependence 12/04/2019      Medications and Compliance:  No current facility-administered medications on file prior to encounter.     Current Outpatient Medications on File Prior to Encounter   Medication Sig Dispense Refill   ? ARIPiprazole (ABILIFY) 30 mg tablet Take one tablet by mouth daily. 30 tablet 0   ? carvediloL (COREG) 3.125 mg tablet Take one tablet by mouth twice daily. Take with food. 60 tablet 0   ? divalproex (DEPAKOTE ER) 500 mg ER tablet Take four tablets by mouth at bedtime daily. Take with food. 120 tablet 0   ? loperamide (IMODIUM A-D) 2 mg capsule Take 2 capsules by mouth initially, followed by 1 capsule by mouth after each loose stool up to a maximum of 8 tablets in 24 hours. 30 capsule    ? melatonin 5 mg tab Take one tablet by mouth at bedtime as needed.     ? nicotine polacrilex (NICORETTE) 4 mg gum Place one each inside cheek (side of mouth) every 1 hour as needed. Chew to soften and park in mouth between lip and gum. 2 box 0   ? pantoprazole DR (PROTONIX) 40 mg tablet Take one tablet by mouth daily. 30 tablet 0   ? traZODone (DESYREL) 50 mg tablet Take one tablet by mouth at bedtime as needed. 30 tablet 0      Psychosocial/DPOA/Guardian:  Karel Turpen [phone]  Loghan Kurtzman (pts father and guardian) [phone]     A&O x4? Y  Eating/Drinking? Y  Ambulating? Y  Urinating? Y     Voluntary Status? Voluntary per guardian        Labs Completed? Per admission guidelines  Covid PCR - not detected  UDS - negative        Additional Information:       ETA? TBD     Form Completed by: Barth Kirks, RN      Date: 11/28/20      Time: 2220

## 2020-11-29 NOTE — ED Notes
Rudell Cobb (pts father and guardian) [phone]

## 2020-11-29 NOTE — ED Notes
Pt accepted for admission to [facility] by Dr.Glenn, assigned to [facility] with Dr.Glenn. ER coordinator notified or admission order input.Pt hand off completed with Barth Kirks, RN with [facility]. Transportation then arranged with [facility] who gave and ETA = 20-30 minutes to pick up from ER. [facility] updated with ETA.
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
## 2020-11-30 ENCOUNTER — Encounter: Admit: 2020-11-30 | Discharge: 2020-11-30

## 2020-11-30 MED ADMIN — NICOTINE (POLACRILEX) 4 MG BU GUM [10718]: 4 mg | BUCCAL | @ 18:00:00 | NDC 00536137223

## 2020-11-30 MED ADMIN — NICOTINE (POLACRILEX) 4 MG BU GUM [10718]: 4 mg | BUCCAL | @ 14:00:00 | NDC 00536137223

## 2020-11-30 MED ADMIN — NICOTINE (POLACRILEX) 4 MG BU GUM [10718]: 4 mg | BUCCAL | NDC 00536311337

## 2020-11-30 MED ADMIN — ARIPIPRAZOLE 15 MG PO TAB [86967]: 15 mg | ORAL | @ 14:00:00 | Stop: 2020-11-30 | NDC 65162089903

## 2020-11-30 MED ADMIN — NICOTINE (POLACRILEX) 4 MG BU GUM [10718]: 4 mg | BUCCAL | @ 20:00:00 | NDC 00536137223

## 2020-11-30 MED ADMIN — NICOTINE (POLACRILEX) 4 MG BU GUM [10718]: 4 mg | BUCCAL | @ 22:00:00 | NDC 00536137223

## 2020-11-30 MED ADMIN — DIVALPROEX 500 MG PO TB24 [80756]: 1000 mg | ORAL | @ 03:00:00 | Stop: 2020-11-30 | NDC 00904636445

## 2020-11-30 MED ADMIN — MELATONIN 3 MG PO TAB [16830]: 3 mg | ORAL | @ 04:00:00 | NDC 20555003601

## 2020-12-01 MED ADMIN — NICOTINE (POLACRILEX) 4 MG BU GUM [10718]: 4 mg | BUCCAL | @ 18:00:00 | NDC 00536137223

## 2020-12-01 MED ADMIN — ARIPIPRAZOLE 10 MG PO TAB [86966]: 20 mg | ORAL | @ 14:00:00 | NDC 00904651161

## 2020-12-01 MED ADMIN — TRAZODONE 50 MG PO TAB [8085]: 50 mg | ORAL | @ 04:00:00 | NDC 00904686861

## 2020-12-01 MED ADMIN — NICOTINE (POLACRILEX) 4 MG BU GUM [10718]: 4 mg | BUCCAL | @ 02:00:00 | NDC 00536137223

## 2020-12-01 MED ADMIN — MELATONIN 3 MG PO TAB [16830]: 3 mg | ORAL | @ 04:00:00 | NDC 50268052411

## 2020-12-01 MED ADMIN — NICOTINE (POLACRILEX) 4 MG BU GUM [10718]: 4 mg | BUCCAL | @ 15:00:00 | NDC 00536137223

## 2020-12-01 MED ADMIN — NICOTINE (POLACRILEX) 4 MG BU GUM [10718]: 4 mg | BUCCAL | @ 21:00:00 | NDC 00536137223

## 2020-12-01 MED ADMIN — NICOTINE (POLACRILEX) 4 MG BU GUM [10718]: 4 mg | BUCCAL | @ 04:00:00 | NDC 00536137223

## 2020-12-01 MED ADMIN — DIVALPROEX 500 MG PO TB24 [80756]: 2000 mg | ORAL | @ 04:00:00 | NDC 00904636445

## 2020-12-02 MED ADMIN — NICOTINE (POLACRILEX) 4 MG BU GUM [10718]: 4 mg | BUCCAL | @ 23:00:00 | NDC 00536137223

## 2020-12-02 MED ADMIN — ARIPIPRAZOLE 10 MG PO TAB [86966]: 20 mg | ORAL | @ 14:00:00 | NDC 00904651161

## 2020-12-02 MED ADMIN — NICOTINE (POLACRILEX) 4 MG BU GUM [10718]: 4 mg | BUCCAL | @ 06:00:00 | NDC 00536137223

## 2020-12-02 MED ADMIN — NICOTINE (POLACRILEX) 4 MG BU GUM [10718]: 4 mg | BUCCAL | @ 12:00:00 | NDC 00536137223

## 2020-12-02 MED ADMIN — MELATONIN 3 MG PO TAB [16830]: 3 mg | ORAL | @ 03:00:00 | NDC 50268052411

## 2020-12-02 MED ADMIN — DIVALPROEX 500 MG PO TB24 [80756]: 2000 mg | ORAL | @ 03:00:00 | NDC 00904636445

## 2020-12-02 MED ADMIN — NICOTINE (POLACRILEX) 4 MG BU GUM [10718]: 4 mg | BUCCAL | @ 01:00:00 | NDC 00536137223

## 2020-12-02 MED ADMIN — NICOTINE (POLACRILEX) 4 MG BU GUM [10718]: 4 mg | BUCCAL | @ 19:00:00 | NDC 00536137223

## 2020-12-02 MED ADMIN — NICOTINE (POLACRILEX) 4 MG BU GUM [10718]: 4 mg | BUCCAL | @ 03:00:00 | NDC 00536137223

## 2020-12-02 MED ADMIN — NICOTINE 21 MG/24 HR TD PT24 [27863]: 2 | TRANSDERMAL | @ 16:00:00 | NDC 00536110888

## 2020-12-02 MED ADMIN — NICOTINE (POLACRILEX) 4 MG BU GUM [10718]: 4 mg | BUCCAL | @ 14:00:00 | NDC 00536137223

## 2020-12-03 ENCOUNTER — Encounter: Admit: 2020-12-03 | Discharge: 2020-12-03

## 2020-12-03 MED ADMIN — NICOTINE (POLACRILEX) 4 MG BU GUM [10718]: 4 mg | BUCCAL | @ 20:00:00 | NDC 00536137223

## 2020-12-03 MED ADMIN — NICOTINE 21 MG/24 HR TD PT24 [27863]: 2 | TRANSDERMAL | @ 14:00:00 | NDC 60505706300

## 2020-12-03 MED ADMIN — TRAZODONE 50 MG PO TAB [8085]: 50 mg | ORAL | @ 04:00:00 | NDC 00904686861

## 2020-12-03 MED ADMIN — NICOTINE (POLACRILEX) 4 MG BU GUM [10718]: 4 mg | BUCCAL | NDC 00536137223

## 2020-12-03 MED ADMIN — NICOTINE (POLACRILEX) 4 MG BU GUM [10718]: 4 mg | BUCCAL | @ 17:00:00 | NDC 00536137223

## 2020-12-03 MED ADMIN — NICOTINE (POLACRILEX) 4 MG BU GUM [10718]: 4 mg | BUCCAL | @ 03:00:00 | NDC 00536137223

## 2020-12-03 MED ADMIN — NICOTINE (POLACRILEX) 4 MG BU GUM [10718]: 4 mg | BUCCAL | @ 23:00:00 | NDC 00536137223

## 2020-12-03 MED ADMIN — MELATONIN 3 MG PO TAB [16830]: 3 mg | ORAL | @ 04:00:00 | NDC 50268052411

## 2020-12-03 MED ADMIN — ARIPIPRAZOLE 10 MG PO TAB [86966]: 10 mg | ORAL | @ 19:00:00 | Stop: 2020-12-03 | NDC 00904651161

## 2020-12-03 MED ADMIN — NICOTINE (POLACRILEX) 4 MG BU GUM [10718]: 4 mg | BUCCAL | @ 12:00:00 | NDC 00536137223

## 2020-12-03 MED ADMIN — ARIPIPRAZOLE 10 MG PO TAB [86966]: 20 mg | ORAL | @ 14:00:00 | Stop: 2020-12-03 | NDC 00904651161

## 2020-12-03 MED ADMIN — DIVALPROEX 500 MG PO TB24 [80756]: 2000 mg | ORAL | @ 04:00:00 | NDC 00904636445

## 2020-12-04 MED ADMIN — MELATONIN 3 MG PO TAB [16830]: 3 mg | ORAL | @ 03:00:00 | NDC 20555003601

## 2020-12-04 MED ADMIN — NICOTINE (POLACRILEX) 4 MG BU GUM [10718]: 4 mg | BUCCAL | @ 20:00:00 | NDC 00536137223

## 2020-12-04 MED ADMIN — NICOTINE (POLACRILEX) 4 MG BU GUM [10718]: 4 mg | BUCCAL | @ 13:00:00 | NDC 00536137223

## 2020-12-04 MED ADMIN — ARIPIPRAZOLE 15 MG PO TAB [86967]: 30 mg | ORAL | @ 15:00:00 | NDC 65162089903

## 2020-12-04 MED ADMIN — DIVALPROEX 500 MG PO TB24 [80756]: 2500 mg | ORAL | @ 03:00:00 | NDC 00904636445

## 2020-12-04 MED ADMIN — NICOTINE (POLACRILEX) 4 MG BU GUM [10718]: 4 mg | BUCCAL | @ 15:00:00 | NDC 00536137223

## 2020-12-04 MED ADMIN — NICOTINE (POLACRILEX) 4 MG BU GUM [10718]: 4 mg | BUCCAL | @ 03:00:00 | NDC 00536137223

## 2020-12-04 MED ADMIN — NICOTINE 21 MG/24 HR TD PT24 [27863]: 2 | TRANSDERMAL | @ 15:00:00 | NDC 60505706300

## 2020-12-05 MED ADMIN — NICOTINE 21 MG/24 HR TD PT24 [27863]: 2 | TRANSDERMAL | @ 15:00:00 | NDC 60505706300

## 2020-12-05 MED ADMIN — DIVALPROEX 500 MG PO TB24 [80756]: 2500 mg | ORAL | @ 04:00:00 | NDC 00904636445

## 2020-12-05 MED ADMIN — NICOTINE (POLACRILEX) 4 MG BU GUM [10718]: 4 mg | BUCCAL | @ 10:00:00 | NDC 00536137223

## 2020-12-05 MED ADMIN — NICOTINE (POLACRILEX) 4 MG BU GUM [10718]: 4 mg | BUCCAL | @ 12:00:00 | NDC 00536137223

## 2020-12-05 MED ADMIN — NICOTINE (POLACRILEX) 4 MG BU GUM [10718]: 4 mg | BUCCAL | @ 15:00:00 | NDC 00536137223

## 2020-12-05 MED ADMIN — ARIPIPRAZOLE 15 MG PO TAB [86967]: 30 mg | ORAL | @ 15:00:00 | NDC 65162089903

## 2020-12-05 MED ADMIN — NICOTINE (POLACRILEX) 4 MG BU GUM [10718]: 4 mg | BUCCAL | @ 02:00:00 | NDC 00536137223

## 2020-12-05 MED ADMIN — MELATONIN 3 MG PO TAB [16830]: 3 mg | ORAL | @ 04:00:00 | NDC 20555003601

## 2020-12-05 MED ADMIN — NICOTINE (POLACRILEX) 4 MG BU GUM [10718]: 4 mg | BUCCAL | @ 21:00:00 | NDC 00536137223

## 2020-12-05 MED ADMIN — NICOTINE (POLACRILEX) 4 MG BU GUM [10718]: 4 mg | BUCCAL | @ 04:00:00 | NDC 00536137223

## 2020-12-05 MED ADMIN — TRAZODONE 50 MG PO TAB [8085]: 50 mg | ORAL | @ 04:00:00 | NDC 00904686861

## 2020-12-06 MED ADMIN — ARIPIPRAZOLE 15 MG PO TAB [86967]: 30 mg | ORAL | @ 15:00:00 | NDC 65162089903

## 2020-12-06 MED ADMIN — MELATONIN 3 MG PO TAB [16830]: 3 mg | ORAL | @ 03:00:00 | NDC 50268052411

## 2020-12-06 MED ADMIN — NICOTINE (POLACRILEX) 4 MG BU GUM [10718]: 4 mg | BUCCAL | @ 20:00:00 | NDC 00536311337

## 2020-12-06 MED ADMIN — TRAZODONE 50 MG PO TAB [8085]: 50 mg | ORAL | @ 03:00:00 | NDC 00904686861

## 2020-12-06 MED ADMIN — NICOTINE 21 MG/24 HR TD PT24 [27863]: 2 | TRANSDERMAL | @ 15:00:00 | NDC 60505706300

## 2020-12-06 MED ADMIN — NICOTINE (POLACRILEX) 4 MG BU GUM [10718]: 4 mg | BUCCAL | @ 15:00:00 | NDC 00536137223

## 2020-12-06 MED ADMIN — NICOTINE (POLACRILEX) 4 MG BU GUM [10718]: 4 mg | BUCCAL | @ 22:00:00 | NDC 00536137223

## 2020-12-06 MED ADMIN — NICOTINE (POLACRILEX) 4 MG BU GUM [10718]: 4 mg | BUCCAL | @ 02:00:00 | NDC 00536137223

## 2020-12-06 MED ADMIN — NICOTINE (POLACRILEX) 4 MG BU GUM [10718]: 4 mg | BUCCAL | @ 13:00:00 | NDC 00536137223

## 2020-12-06 MED ADMIN — DIVALPROEX 500 MG PO TB24 [80756]: 2500 mg | ORAL | @ 03:00:00 | NDC 00904636445

## 2020-12-07 MED ADMIN — NICOTINE (POLACRILEX) 4 MG BU GUM [10718]: 4 mg | BUCCAL | @ 21:00:00 | NDC 00536137223

## 2020-12-07 MED ADMIN — NICOTINE (POLACRILEX) 4 MG BU GUM [10718]: 4 mg | BUCCAL | @ 17:00:00 | NDC 00536137223

## 2020-12-07 MED ADMIN — DIVALPROEX 500 MG PO TB24 [80756]: 2500 mg | ORAL | @ 03:00:00 | NDC 00904636461

## 2020-12-07 MED ADMIN — NICOTINE 21 MG/24 HR TD PT24 [27863]: 2 | TRANSDERMAL | @ 15:00:00 | NDC 60505706300

## 2020-12-07 MED ADMIN — MELATONIN 3 MG PO TAB [16830]: 3 mg | ORAL | @ 03:00:00 | NDC 50268052411

## 2020-12-07 MED ADMIN — ARIPIPRAZOLE 15 MG PO TAB [86967]: 30 mg | ORAL | @ 14:00:00 | NDC 65162089903

## 2020-12-07 MED ADMIN — HYDROXYZINE HCL 25 MG PO TAB [3774]: 25 mg | ORAL | @ 01:00:00 | Stop: 2020-12-07 | NDC 00904661761

## 2020-12-07 MED ADMIN — TRAZODONE 50 MG PO TAB [8085]: 50 mg | ORAL | @ 03:00:00 | NDC 00904686861

## 2020-12-07 MED ADMIN — NICOTINE (POLACRILEX) 4 MG BU GUM [10718]: 4 mg | BUCCAL | @ 13:00:00 | NDC 00536137223

## 2020-12-08 MED ADMIN — NICOTINE (POLACRILEX) 4 MG BU GUM [10718]: 4 mg | BUCCAL | @ 17:00:00 | NDC 00536137223

## 2020-12-08 MED ADMIN — NICOTINE (POLACRILEX) 4 MG BU GUM [10718]: 4 mg | BUCCAL | @ 02:00:00 | NDC 00536137223

## 2020-12-08 MED ADMIN — MELATONIN 3 MG PO TAB [16830]: 3 mg | ORAL | @ 04:00:00 | NDC 20555003601

## 2020-12-08 MED ADMIN — NICOTINE 21 MG/24 HR TD PT24 [27863]: 2 | TRANSDERMAL | @ 14:00:00 | NDC 60505706300

## 2020-12-08 MED ADMIN — DIVALPROEX 500 MG PO TB24 [80756]: 2500 mg | ORAL | @ 04:00:00 | NDC 00904636461

## 2020-12-08 MED ADMIN — NICOTINE (POLACRILEX) 4 MG BU GUM [10718]: 4 mg | BUCCAL | @ 13:00:00 | NDC 00536137223

## 2020-12-08 MED ADMIN — ARIPIPRAZOLE 15 MG PO TAB [86967]: 30 mg | ORAL | @ 14:00:00 | NDC 65162089903

## 2020-12-08 MED ADMIN — NICOTINE (POLACRILEX) 4 MG BU GUM [10718]: 4 mg | BUCCAL | @ 19:00:00 | NDC 00536137223

## 2020-12-09 MED ADMIN — DIVALPROEX 500 MG PO TB24 [80756]: 2500 mg | ORAL | @ 03:00:00 | NDC 00904636461

## 2020-12-09 MED ADMIN — MELATONIN 3 MG PO TAB [16830]: 3 mg | ORAL | @ 03:00:00 | NDC 20555003601

## 2020-12-09 MED ADMIN — NICOTINE (POLACRILEX) 4 MG BU GUM [10718]: 4 mg | BUCCAL | @ 13:00:00 | Stop: 2020-12-09 | NDC 00536137223

## 2020-12-09 MED ADMIN — NICOTINE (POLACRILEX) 4 MG BU GUM [10718]: 4 mg | BUCCAL | NDC 00536137223

## 2020-12-09 MED ADMIN — ARIPIPRAZOLE 15 MG PO TAB [86967]: 30 mg | ORAL | @ 14:00:00 | Stop: 2020-12-09 | NDC 65162089903

## 2020-12-09 MED ADMIN — NICOTINE (POLACRILEX) 4 MG BU GUM [10718]: 4 mg | BUCCAL | @ 03:00:00 | NDC 00536137223

## 2020-12-09 MED ADMIN — NICOTINE (POLACRILEX) 4 MG BU GUM [10718]: 4 mg | BUCCAL | @ 19:00:00 | Stop: 2020-12-09 | NDC 00536137223

## 2020-12-09 MED ADMIN — NICOTINE 21 MG/24 HR TD PT24 [27863]: 2 | TRANSDERMAL | @ 14:00:00 | Stop: 2020-12-09 | NDC 60505706300

## 2020-12-09 MED FILL — ARIPIPRAZOLE 30 MG PO TAB: 30 mg | ORAL | 30 days supply | Qty: 30 | Fill #1 | Status: CP

## 2020-12-09 MED FILL — DIVALPROEX 500 MG PO TB24: 500 mg | ORAL | 30 days supply | Qty: 150 | Fill #1 | Status: CP

## 2020-12-10 NOTE — Telephone Encounter
Confirmation this is the pt the call is intended for (name/DOB): yes    Any questions about DC instructions? Denies     Did you have any difficulty filling Rx's? No     Any questions about follow-up appointments? No difficulty with follow up appointments     Name of case manager to follow up with:     Did you receive all items at discharge that you came in with?: yes     Any other questions/comments: Pt states, "Why was I there 11 days vs 7 days? I was good and ready to go."

## 2020-12-30 ENCOUNTER — Emergency Department
Admit: 2020-12-30 | Discharge: 2020-12-30 | Disposition: A | Discharge status: Home / Self Care | Payer: MEDICARE | Attending: Student in an Organized Health Care Education/Training Program

## 2020-12-30 ENCOUNTER — Encounter: Admit: 2020-12-30 | Discharge: 2020-12-30

## 2020-12-30 DIAGNOSIS — F250 Schizoaffective disorder, bipolar type: Secondary | ICD-10-CM

## 2020-12-30 DIAGNOSIS — S0993XA Unspecified injury of face, initial encounter: Secondary | ICD-10-CM

## 2020-12-30 NOTE — Case Management (ED)
ED Social Worker contacted by [place], Charity fundraiser, to assist patient with d/c home.  It was reported that patient's address is [address] [place].    Cab voucher provided to patient as patient states limited resources and supports available to assist with d/c.  Cab voucher completed and provided to Triage RN for patients d/c home.  Patient instructed to wait in the ED lobby for cab to arrive.    Eulas Post, LMSW, LCSW  Overnight ED Social Worker  [phone]

## 2020-12-30 NOTE — ED Notes
Legal Guardian notified of pt's arrival to ED.  Consent to treat obtained from Legal Guardian Shellee Milo, verified by 2nd RN Maryruth Bun RN

## 2020-12-30 NOTE — ED Notes
PT given food and lunch box per okay from Lo, MD.
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
## 2020-12-30 NOTE — ED Notes
Pt appropriate for DC at this time. Pt given AVS and verbalized understanding of instructions with no further questions. Pt educated on follow up care and agreeable to plan of care. IV removed. Pt ambulates out of department with even and steady gait and all belongings to triage to await cab.
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
## 2021-01-21 ENCOUNTER — Encounter: Admit: 2021-01-21 | Discharge: 2021-01-21

## 2021-01-21 ENCOUNTER — Ambulatory Visit: Admit: 2021-01-21 | Discharge: 2021-01-22 | Payer: MEDICARE

## 2021-01-21 DIAGNOSIS — F250 Schizoaffective disorder, bipolar type: Secondary | ICD-10-CM

## 2021-01-21 DIAGNOSIS — F17200 Nicotine dependence, unspecified, uncomplicated: Secondary | ICD-10-CM

## 2021-01-21 DIAGNOSIS — I514 Myocarditis, unspecified: Secondary | ICD-10-CM

## 2021-01-21 DIAGNOSIS — F1020 Alcohol dependence, uncomplicated: Secondary | ICD-10-CM

## 2021-01-21 MED ORDER — DIVALPROEX 500 MG PO TB24
2500 mg | ORAL_TABLET | Freq: Every evening | ORAL | 0 refills | 30.00000 days | Status: AC
Start: 2021-01-21 — End: ?

## 2021-01-21 MED ORDER — PALIPERIDONE PALMITATE 156 MG/ML IM SYRG
156 mg | INTRAMUSCULAR | 0 refills | Status: AC
Start: 2021-01-21 — End: ?

## 2021-01-21 MED ORDER — PALIPERIDONE 6 MG PO TR24
6 mg | ORAL_TABLET | Freq: Every day | ORAL | 1 refills | 28.00000 days | Status: DC
Start: 2021-01-21 — End: 2021-01-21

## 2021-01-21 MED ORDER — PALIPERIDONE 6 MG PO TR24
6 mg | ORAL_TABLET | Freq: Every day | ORAL | 0 refills | 28.00000 days | Status: AC
Start: 2021-01-21 — End: ?

## 2021-01-21 MED ORDER — INVEGA SUSTENNA 156 MG/ML IM SYRG
156 mg | INTRAMUSCULAR | 5 refills | 28.00000 days | Status: AC
Start: 2021-01-21 — End: ?

## 2021-01-21 NOTE — Progress Notes
ATTENDING NOTE           Encounter Date: 01/21/2021  I saw and evaluated Jamie [name], discussed with Jamie Putnam, DO and concur with the assessment and treatment plan.     PRESENTING PROBLEM AND BACKGROUND: Patient is 25 y.o. adult with diagnosis bipolar vs schizoaffective disorder followed in our clinic for >10years. Multiple hospitalizations for depression, psychotic mania and at times alcohol use. Of note patient started on clozapine during January 2018 hospitalization but developed myocarditis and clozapine discontinued. Cardiac status returned to baseline and patient tried on several medications before stabilizing on aripiprazole and divalproex. More recently, hospitalized at [facility] Jan 29-Dec 06, 2020 and afterwards at [facility]. Medications were adjusted and he was discharged on divalproex and Invega sustenna, ?dose. His parents are his legal guardian. He lives in his own apartment and is  in regular contact with parents. He has been working at as host at [employer] .    CURRENT TREATMENT AND RESPONSES: Patient accompanied by mother and both report he has been hypomanic-manic for the last few months although symptoms a little better now. He acknowledges being inconsistent in taking his medications and for that reason likes the Invega injection. Patient and mother asking about ECT and are willing to consider.    PLAN:  1. Continue divalproex 2000 mg qd - follow blood level and labs  2. Contact prior provider to determine when he took last injection and at what dose.  3. Resume Invega tabs 6 mg qd until next injection can be arranged.  4. Assess for ECT - contact Dr. Erick Colace - patient has guardian which may complicate receiving ECT  5. Patient describes seeing colored lights and halos - ocular pathology has been ruled out - continue to monitor  6. Monitor affect, psychosis, suicidal ideation, AIMS, metabolic syndrome labs, valproic acid levels and  LFTs
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
## 2021-01-21 NOTE — Patient Instructions
Nice to meet you!    -Take Divalproex ER (depakote) 2500mg  at night for schizoaffective disorder  -Start Paliperidone/Invega oral 6mg  at night for schizoaffective disorder  -We will see about the injectable being approved, I will reach out to pharmacy for this  -I will also reach out to our ECT staff and see about getting you ECT if we can do it. We will reach out to let you know    Let me know if you have any issues in the meantime    -Dr 

## 2021-01-22 ENCOUNTER — Encounter: Admit: 2021-01-22 | Discharge: 2021-01-22

## 2021-01-22 NOTE — Progress Notes
No Prior Authorization was needed for [place] for [employer].  The copay is $9.85.        The medication will be set for pick up at our [facility] for a clinic staff member to pick up before the patient comes to the clinic for administration.    Payment information placed on file, given by legal guardian Kent Pembroke.         Alphonsus Sias  Pharmacy Patient Advocate  [phone]

## 2021-03-02 ENCOUNTER — Encounter: Admit: 2021-03-02 | Discharge: 2021-03-02

## 2021-03-02 MED ORDER — DIVALPROEX 500 MG PO TB24
ORAL_TABLET | Freq: Every evening | 0 refills
Start: 2021-03-02 — End: ?

## 2021-03-16 ENCOUNTER — Encounter: Admit: 2021-03-16 | Discharge: 2021-03-16

## 2021-03-16 ENCOUNTER — Inpatient Hospital Stay
Admit: 2021-03-16 | Discharge: 2021-06-11 | Disposition: A | Discharge status: Still In Hospital | Payer: MEDICARE | Attending: Psychiatry

## 2021-03-16 DIAGNOSIS — B3322 Viral myocarditis: Secondary | ICD-10-CM

## 2021-03-16 DIAGNOSIS — F1520 Other stimulant dependence, uncomplicated: Secondary | ICD-10-CM

## 2021-03-16 DIAGNOSIS — F250 Schizoaffective disorder, bipolar type: Secondary | ICD-10-CM

## 2021-03-16 DIAGNOSIS — F17200 Nicotine dependence, unspecified, uncomplicated: Secondary | ICD-10-CM

## 2021-03-16 DIAGNOSIS — Z79899 Other long term (current) drug therapy: Secondary | ICD-10-CM

## 2021-03-16 DIAGNOSIS — E669 Obesity, unspecified: Secondary | ICD-10-CM

## 2021-03-16 DIAGNOSIS — F1020 Alcohol dependence, uncomplicated: Secondary | ICD-10-CM

## 2021-03-16 DIAGNOSIS — K219 Gastro-esophageal reflux disease without esophagitis: Secondary | ICD-10-CM

## 2021-03-16 DIAGNOSIS — I429 Cardiomyopathy, unspecified: Secondary | ICD-10-CM

## 2021-03-16 DIAGNOSIS — F1410 Cocaine abuse, uncomplicated: Secondary | ICD-10-CM

## 2021-03-16 DIAGNOSIS — I514 Myocarditis, unspecified: Secondary | ICD-10-CM

## 2021-03-16 DIAGNOSIS — I10 Essential (primary) hypertension: Secondary | ICD-10-CM

## 2021-03-16 DIAGNOSIS — F1593 Other stimulant use, unspecified with withdrawal: Secondary | ICD-10-CM

## 2021-03-16 DIAGNOSIS — Z7289 Other problems related to lifestyle: Secondary | ICD-10-CM

## 2021-03-16 MED ORDER — TRAZODONE 50 MG PO TAB
50 mg | Freq: Every evening | ORAL | 0 refills | Status: DC | PRN
Start: 2021-03-16 — End: 2021-06-11
  Administered 2021-03-21 – 2021-06-01 (×44): 50 mg via ORAL

## 2021-03-16 MED ORDER — OLANZAPINE 5 MG PO TBDI
5 mg | ORAL | 0 refills | Status: DC | PRN
Start: 2021-03-16 — End: 2021-06-11

## 2021-03-16 MED ORDER — DIVALPROEX 500 MG PO TB24
1000 mg | Freq: Every evening | ORAL | 0 refills | Status: DC
Start: 2021-03-16 — End: 2021-03-18
  Administered 2021-03-17 – 2021-03-18 (×2): 1000 mg via ORAL

## 2021-03-16 MED ORDER — RISPERIDONE 2 MG PO TAB
2 mg | Freq: Every evening | ORAL | 0 refills | Status: DC
Start: 2021-03-16 — End: 2021-03-18
  Administered 2021-03-17 – 2021-03-18 (×2): 2 mg via ORAL

## 2021-03-16 MED ORDER — POLYETHYLENE GLYCOL 3350 17 GRAM PO PWPK
1 | Freq: Every day | ORAL | 0 refills | Status: DC | PRN
Start: 2021-03-16 — End: 2021-06-11

## 2021-03-16 MED ORDER — OLANZAPINE 10 MG IM SOLR
5 mg | INTRAMUSCULAR | 0 refills | Status: DC | PRN
Start: 2021-03-16 — End: 2021-06-11

## 2021-03-16 MED ORDER — CALCIUM CARBONATE 200 MG CALCIUM (500 MG) PO CHEW
1000 mg | Freq: Three times a day (TID) | ORAL | 0 refills | Status: DC | PRN
Start: 2021-03-16 — End: 2021-06-11

## 2021-03-16 MED ORDER — ACETAMINOPHEN 325 MG PO TAB
650 mg | ORAL | 0 refills | Status: DC | PRN
Start: 2021-03-16 — End: 2021-06-11

## 2021-03-17 ENCOUNTER — Encounter: Admit: 2021-03-17 | Discharge: 2021-03-17 | Payer: MEDICAID

## 2021-03-17 MED ORDER — NICOTINE (POLACRILEX) 4 MG BU GUM
4 mg | BUCCAL | 0 refills | Status: DC | PRN
Start: 2021-03-17 — End: 2021-06-11
  Administered 2021-03-17 – 2021-06-10 (×166): 4 mg via BUCCAL

## 2021-03-17 MED ORDER — NICOTINE 21 MG/24 HR TD PT24
1 | Freq: Every day | TRANSDERMAL | 0 refills | Status: DC
Start: 2021-03-17 — End: 2021-05-10
  Administered 2021-03-17 – 2021-05-09 (×54): 1 via TRANSDERMAL

## 2021-03-17 NOTE — Progress Notes
Patient sleeping soundly and eating snacks received po medication later than expected related to sleeping he is in a pleasant mood just wants to sleep

## 2021-03-17 NOTE — Progress Notes
Metabolics Readings:    Most Recent BP:    BP Readings from Last 1 Encounters:   03/17/21 120/66     Last Weight: There were no vitals filed for this visit.  There is no height or weight on file to calculate BMI.    Lipid Panel:   LDL   Date Value Ref Range Status   11/30/2020 107 (H) <100 mg/dL Final     HDL   Date Value Ref Range Status   11/30/2020 61 >40 MG/DL Final     VLDL   Date Value Ref Range Status   11/30/2020 37 MG/DL Final     Cholesterol   Date Value Ref Range Status   11/30/2020 185 <200 MG/DL Final     Triglycerides   Date Value Ref Range Status   11/30/2020 186 (H) <150 MG/DL Final       Blood Glucose:  Hemoglobin A1C   Date Value Ref Range Status   11/30/2020 5.3 4.0 - 6.0 % Final     Comment:     The ADA recommends that most patients with type 1 and type 2 diabetes maintain   an A1c level <7%.         Second-generation antipsychotics (SGAs) are associated with metabolic dysregulation, including obesity, diabetes, dyslipidemia, and hypertension. The dramatic increase in diabetes, diabetic ketoacidosis, and death in the late 1990s and early 2000s prompted the FDA in August 2003 to apply a class warning to the labels of all SGAs and require that practitioners monitor metabolic parameters in patients receiving SGAs, before and after initiating therapy.    Hazle Quant, PHARMD

## 2021-03-17 NOTE — Progress Notes
PSYCHIATRIC NURSING ASSESSMENT     NAME:Jamie [name]             MRN: [id number]             DOB:[birth year]          AGE: 25 y.o.  ADMISSION DATE: 03/16/2021             DAYS ADMITTED: LOS: 0 days    Mental Status Exam  Legal Status: Voluntary Admission  General Appearance: Disheveled  Mood / Affect: Anxious  Speech: Normal  Content Of Thought: Normal  Motor Activity: Decreased amount  Flow of Thought: Flight of ideas  Insight / Judgment: Poor judgment, Poor insight  Behavior: Cooperative, Follows commands  Patient Strengths: Exercising self-direction, Interpersonal relationships and supports, i.e. family, friends, peers    [name] rated anxiety 6-7/10. Denied SI/HI, AVH, and pain. He slept for most of this shift, and he was up for meals. Pt reports he needed to rest after smoking meth. He reports his mood recently has been "crappy." Pt declined orientation to the unit and guidelines due to previous admissions. He appears comfortable on the unit, and he is friendly with all staff on the unit. Less interaction with peers. Not observed responding to internal stimuli. PRN nicotine gum administered per orders for cravings.    Encouraged him to come to staff with concerns. Pt is on 15 minute checks to monitor for safety and behaviors.     Renee Ramus, RN  03/17/2021
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
## 2021-03-17 NOTE — H&P (View-Only)
PSYCHIATRY HISTORY & PHYSICAL EXAM       Room/Bed: ZO1096/04    Admission Date:     03/16/2021                                                LOS: 0 days     ASSESSMENT & DIAGNOSIS     Jamie [name] is a 25 y.o. Caucasian adult with a history of schizoaffective disorder and polysubstance abuse who presented to [facility] as a transfer from OSH with manic symptoms in context of methamphetamine use. This appears to be precipitated by methamphetamine use. Factors that seem to have predisposed him to mania include schizoaffective disorder, substance abuse and uncle with schizophrenia. This current problem is maintained by ongoing substance abuse, lack of consistent medication compliance, and poor insight. However, protective factors include good support from parents who are guardians, motivation to seek treatment, and fair insight into current situation. Proposed treatment will consist of substance detoxification and medication management.    DSM-5 DIAGNOSES:  1. Stimulant use disorder, methamphetamine type, moderate  2. Methamphetamine withdrawal  3. Schizoaffective disorder, bipolar type  4. Tobacco use disorder (chewing tobacco), severe  5. H/o cocaine abuse       PLAN     ? Admit to acute adult inpatient psychiatry unit for safety, stabilization, and monitoring.  ? Restart Depakote ER 1,000mg  QHS for schizoaffective disorder and mood stabilization  ? Start Risperidone 2mg  QHS  o Patient previously on Paliperidone 6mg  QHS PTA, will attempt to start LAI  ? As needed medications available for severe agitation.  ? Resuming other prior to arrival medications for general medical conditions:  o Trazodone 50mg  for insmonia  ? Encourage participation in ward milieu and therapies.  ? Admission labs reviewed:  o UDS positive for amphetamines  o EKG reviewed: QTc 408 on 5/18. Normal sinus rhythm. No ST changes.  ? Will perform AIMS this admission.  ? Metabolic labs ordered for today.  ? NRT  ? Parents are guardians, will speak to them regarding restarting medications  ? Reviewed outside records.    Disposition: Maintain admission for safety and stabilization.  Will likely go home post-discharge.    Seen and discussed with Dr. Lajean Saver.       CHIEF CONCERN     The patient presented with concern of ?I used meth.?       HISTORY OF PRESENT ILLNESS     Jamie [name] is a 25 y.o. Caucasian adult with a history of schizoaffective disorder, bipolar type and polysubstance abuse (methamphetamine, cocaine, tobacco) who was admitted as a transfer from outside hospital for reported methamphetamine use and manic symptoms.  Patient reports that he has been abusing methamphetamines for the past 1 to 2 weeks which is led to significant lack of sleep and reported manic symptoms.  He reports that symptoms include increased activity including working 2 jobs, impulsivity (increased drug use and erratic driving), severe racing thoughts with some mild flight of ideas, and auditory hallucinations/delusions of music.    Patient denies depression but reports anxiety, specifically stating I am worried about becoming a meth head.  He denied any current SI/HI/VH and feels that all currently reported symptoms are in context of methamphetamine use.    During this encounter patient seemed to be generally linear but nodded off to sleep numerous times (roughly every  20 to 30 seconds) requiring redirection.  He was easily redirectable and was generally linear and appropriate with his answers.  Patient is well known to the psychiatrist as he was previously treated by myself at this inpatient unit 26 months ago and has been admitted to this unit 3-4 times in the past 2 years.      PSYCHIATRIC REVIEW OF SYMPTOMS     Depression (5/9): The patient denies recent episodes of depression.    Mania (3+): The patient endorses symptoms of mania as noted in HPI.    Psychosis: The patient endorses auditory hallucinations of music but denies VH, paranoia, or obvious delusional thought processes (denied feelings of being followed, persecuted, delusions of reference)    Anxiety (3/6): The patient endorses recent anxiety in the context of methamphetamine use as stated in HPI.      PAST PSYCHIATRIC HISTORY     Onset: 1 to 2 weeks ago  Outpatient Provider: Dr. Geradine Girt at [facility]  Diagnoses: Schizoaffective disorder, bipolar type, multiple substance use disorders  Psychiatric Hospitalizations: 14 at [facility] in the last 7 years  Past Suicide Attempts: Per chart review at least 2 past attempts in 2017    Current Psychiatric Medications:  1. Depakote 1000 mg twice daily  2. Paliperidone p.o. 6 mg nightly    Past Psychiatric Medication Trials & Responses per chart review:  1. Clozapine - myocarditis  2. Haloperidol - dystonia  3. Paliperidone and Lorelei Pont (did not work)  4. Clonazepam  5. Lithium (caused bradycardia, given as child)  6. Quetiapine (not effective)  7. Risperidone (not effective)  8. Sertraline  9. Ziprasidone (not effective)  10. Abilify (apparently changed d/t non-compliance with oral abilify)      FAMILY PSYCHIATRIC HISTORY     Per chart review great uncle with schizophrenia    FAMILY MEDICAL HISTORY     Family History   Problem Relation Age of Onset   ? Schizophrenia Maternal Uncle          PAST MEDICAL AND SURGICAL HISTORY     Medical History:   Diagnosis Date   ? Alcohol dependence (HCC) 06/24/2019   ? Hypertension    ? Myocarditis (HCC)    ? Schizoaffective disorder, bipolar type (HCC)    ? Tobacco use disorder, moderate, dependence 12/04/2019     History reviewed. No pertinent surgical history.    Home Medications  Facility-Administered Medications Prior to Admission   Medication Dose Route Frequency Provider Last Rate Last Admin   ? paliperidone palmitate (INVEGA) (+) injection 156 mg  156 mg Intramuscular Q28 Days (4 weeks) Nobie Putnam, DO         Medications Prior to Admission   Medication Sig Dispense Refill Last Dose   ? divalproex (DEPAKOTE EC) 500 mg DR tablet 2,500 mg at bedtime daily.   Unknown   ? ibuprofen (MOTRIN) 800 mg tablet Take 800 mg by mouth three times daily as needed.   Unknown   ? paliperidone (INVEGA) 6 mg ER tablet Take one tablet by mouth daily. 30 tablet 0 Past Month   ? paliperidone palmitate (INVEGA SUSTENNA) 156 mg/1 mL injection Inject 1 mL into the muscle every 28 days. Indications: schizophrenia with mood changes 1 mL 5 Unknown       Hospital Medications  Scheduled Meds:divalproex (DEPAKOTE ER) ER tablet 1,000 mg, 1,000 mg, Oral, QHS  risperiDONE (RisperDAL) tablet 2 mg, 2 mg, Oral, QHS    Continuous Infusions:  PRN and Respiratory Meds:acetaminophen Q6H PRN, calcium  carbonate TID PRN, OLANZapine Q6H PRN **OR** OLANZapine Q6H PRN, polyethylene glycol 3350 QDAY PRN, traZODone QHS PRN      Allergies  Clozapine and Haldol [haloperidol lactate]      SUBSTANCE USE     Tobacco: 2 cans of chew per day  Alcohol: Reports drinking 1 beer every 2-3 days  Marijuana: denied  Methamphetamines/Stimulants: Reports using regularly over the past ~2 weeks  Denies all other recent substance use        SOCIAL AND DEVELOPMENTAL HISTORY     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  Legal: Denies current legal issues  Social Supports: parents are guardians      Social History     Socioeconomic History   ? Marital status: Single     Spouse name: 0   ? Number of children: 0   Tobacco Use   ? Smoking status: Never Smoker   ? Smokeless tobacco: Current User   Vaping Use   ? Vaping Use: Some days   ? Substances: Nicotine, Flavoring   Substance and Sexual Activity   ? Alcohol use: Yes     Comment: 1-2 x/week   ? Drug use: Not Currently     Types: Marijuana   Other Topics Concern   ? Military Service No   ? Blood Transfusions No   ? Caffeine Concern No   ? Occupational Exposure No   ? Hobby Hazards No   ? Weight Concern No   ? Special Diet No   ? Back Care No   ? Exercise No         REVIEW OF SYSTEMS   A 14-point Review of Systems is obtained and is negative with the exception of the following:    A 14 point review of systems was negative except for: Constitutional: positive for fatigue  Behvioral/Psych: positive for anxiety, illegal drug usage and tobacco use      OBJECTIVE     Vital Signs:  Last Filed in 24 hours Vital Signs:  24 hour Range    BP: 120/66 (05/18 0800)  Temp: 36.4 ?C (97.5 ?F) (05/18 0800)  Pulse: 66 (05/18 0800)  Respirations: 10 PER MINUTE (05/18 0800)  SpO2: 99 % (05/18 0800) BP: (120)/(66)   Temp:  [36.4 ?C (97.5 ?F)-36.5 ?C (97.7 ?F)]   Pulse:  [64-66]   Respirations:  [10 PER MINUTE-20 PER MINUTE]   SpO2:  [98 %-99 %]        MENTAL STATUS EXAMINATION     ? General/Constitutional: appears stated age, dressed in hospital clothes, appropriate grooming  ? Eye Contact: fair  ? Behavior: falling asleep during encounter, frequently needed to be woken up  ? Speech: Normal rate, volume, and tone. Good articulation  ? Mood: I'm in a psych hospital, it can't be good  ? Affect: somnolent  ? Thought Process: Generally linear, redirectable  ? Thought Content: denies SI/HI  ? Perception: AH as above, denies VH, no evidence of paranoia or delusions  ? Associations: Intact  ? Insight: fair  ? Judgment: poor    ? Orientation: Alert and grossly oriented  ? Recent and remote memory: grossly intact  ? Attention span and concentration: poor  ? Language: English, fluent  ? Fund of knowledge and vocabulary: average       PHYSICAL EXAMINATION   ? General: Somnolent, repeatedly falling asleep  ? Eyes: EOMI, no scleral  icterus noted  ? Extremities/Musculoskeletal:  Moves all extremities through full active ROM  ? Gait ambulates independently without difficulties       LABORATORY/RADIOLOGIC/OTHER TESTS     24-hour labs:    Results for orders placed or performed during the hospital encounter of 03/16/21 (from the past 24 hour(s))   COMPREHENSIVE METABOLIC PANEL Collection Time: 03/17/21  7:11 AM   Result Value Ref Range    Sodium 144 137 - 147 MMOL/L    Potassium 4.4 3.5 - 5.1 MMOL/L    Chloride 108 98 - 110 MMOL/L    Glucose 91 70 - 100 MG/DL    Blood Urea Nitrogen 13 7 - 25 MG/DL    Creatinine 4.09 0.4 - 1.24 MG/DL    Calcium 9.5 8.5 - 81.1 MG/DL    Total Protein 6.0 6.0 - 8.0 G/DL    Total Bilirubin 0.5 0.3 - 1.2 MG/DL    Albumin 4.2 3.5 - 5.0 G/DL    Alk Phosphatase 65 25 - 110 U/L    AST (SGOT) 12 7 - 40 U/L    CO2 29 21 - 30 MMOL/L    ALT (SGPT) 19 7 - 56 U/L    Anion Gap 7 3 - 12    eGFR >60 >60 mL/min   CBC AND DIFF    Collection Time: 03/17/21  7:11 AM   Result Value Ref Range    White Blood Cells 6.2 4.5 - 11.0 K/UL    RBC 4.86 4.4 - 5.5 M/UL    Hemoglobin 14.9 13.5 - 16.5 GM/DL    Hematocrit 91.4 40 - 50 %    MCV 88.8 80 - 100 FL    MCH 30.6 26 - 34 PG    MCHC 34.5 32.0 - 36.0 G/DL    RDW 78.2 11 - 15 %    Platelet Count 250 150 - 400 K/UL    MPV 8.3 7 - 11 FL    Neutrophils 44 41 - 77 %    Lymphocytes 37 24 - 44 %    Monocytes 12 4 - 12 %    Eosinophils 6 (H) 0 - 5 %    Basophils 1 0 - 2 %    Absolute Neutrophil Count 2.77 1.8 - 7.0 K/UL    Absolute Lymph Count 2.30 1.0 - 4.8 K/UL    Absolute Monocyte Count 0.77 0 - 0.80 K/UL    Absolute Eosinophil Count 0.35 0 - 0.45 K/UL    Absolute Basophil Count 0.05 0 - 0.20 K/UL   TSH WITH FREE T4 REFLEX    Collection Time: 03/17/21  7:11 AM   Result Value Ref Range    TSH 0.67 0.35 - 5.00 MCU/ML   VALPROIC ACID LEVEL    Collection Time: 03/17/21  7:11 AM   Result Value Ref Range    Valproic Acid 17.0 (L) 50.0 - 100.0 MCG/ML     ______________________________________________________________  Floreen Comber, DO

## 2021-03-17 NOTE — Progress Notes
[facility]  Initial Clinical Assessment    NAME:Jamie [name] MRN: [id number] DOB:[birth year] AGE: 25 y.o.  ADMISSION DATE: 03/16/2021 DAYS ADMITTED: LOS: 0 days    Date of Service: 03/17/21    Active Problems:    * No active hospital problems. *    Reason for Admission: Therapist walked by Patient's room several times today and he appeared asleep.  Treatment Team will follow up again tomorrow.

## 2021-03-17 NOTE — Progress Notes
Pharmacy Nicotine Replacement Therapy  Pharmacy was consulted to assist with management of nicotine replacement therapy (NRT).    Jamie [name] is a 25 y.o.adult who reports the following home nicotine usage:  Social History     Tobacco Use   Smoking Status Never Smoker   Smokeless Tobacco Current User      Plan:  Nicotine Replacement Therapy Active IP Orders (From admission, onward)     Start     Ordered Stop    03/17/21 1745  nicotine (NICODERM CQ STEP 1) 21 mg/day patch 1 patch  1 patch,   Transdermal,   DAILY        Route Transdermal  Dose 1 patch  Start 03/17/21 1745  End Until Discontinued  Last Admin --     03/17/21 1641 --    03/17/21 1641  nicotine polacrilex (NICORETTE) gum 4 mg  4 mg,   Buccal,   EVERY  2 HOURS PRN        Route Buccal  Dose 4 mg  Start 03/17/21 1641  End Until Discontinued  Last Admin --     03/17/21 1641 --              Per patient, currently using 2-3 cans of dip tobacco per day, based off this information, the orders above reflect daily use. Pharmacy will continue to monitor Mr. Rowland for signs and symptoms of nicotine withdrawal or excess and adjust nicotine replacement therapy accordingly.      Hannah Beat, [facility]  03/17/2021

## 2021-03-17 NOTE — Progress Notes
Case Manager Initial Assessment    NAME:Jamie [name] MRN: [id number] DOB:[birth year] AGE: 25 y.o.  ADMISSION DATE: 03/16/2021 DAYS ADMITTED: LOS: 0 days    Date of Service: 03/17/21    Active Problems:    * No active hospital problems. *      Case manager met with patient on the unit for an initial individual session. Session centered around completing patient's case management assessment to discuss reason for admission, outpatient services, placement, discharge plans, and safety planning. Throughout session, patient was calm / cooperative, and was able to maintain a regulated state. Patient presented as appropriate. Patient appeared to be coherent/clear. Patient's thought process was within normal limits. Patient did not endorse SI/HI/AVH. Future sessions will focus on building safety plan and work on discharge planning.  ___________________________________________________________________________________________________________________________________________    Reason for admission: Anxiety, substance use    Living Situation: Independently in apartment     Guardian/Involuntary: Gur[place]ians: Mom - Jamie [name] - [phone] Dad - Jamie [name] [phone]    Placement upon discharge  Patient's comments: Return home     Significant Supports (significant others, parents, adult children, friends, spiritual community):  Name: Jamie [name] information: [phone]  Relationship: Mom/guardian   ROI: verbal     Current Mental Health and Social Services:   Psychiatrist: Dr. Geradine [name]    Therapist: Dr. Smitty [name]    Source of Income:   Employment History (within the last year): [employer]  Current Employment: [employer]    Insurance: [place] Medicaid     Legal: Denies    Immigration status: C    Prior Hospitalizations  Location: [facility]  Dates: 2/21, 1/22    Prior Residential Placements  Location: Denies  Dates: NA    ___________________________________________________________________________________________________________________________________________  Comments: Pt was asleep upon assessment and woke up to verbal prompts. He continued to fall asleep throughout assessment. Denied any case management needs.

## 2021-03-17 NOTE — Care Plan
Problem: Health Maintenance - Impaired  Goal: Able to perfom ADL's  Outcome: Goal Ongoing  Goal: Adequate nutritional intake  Outcome: Goal Ongoing  Goal: Establish therapeutic relationships  Outcome: Goal Ongoing  Goal: Knowledge of health maintenance  Outcome: Goal Ongoing     Problem: Mood - Altered  Goal: Stabilize mood  Outcome: Goal Ongoing  Goal: Knowledge of Altered Mood  Outcome: Goal Ongoing     Problem: Self-esteem - Low  Goal: Demonstration of positive self-esteem  Outcome: Goal Ongoing  Goal: Knowledge of low self-esteem  Outcome: Goal Ongoing     Problem: Thought Process - Altered  Goal: Demonstration of organized thought processes  Outcome: Goal Ongoing  Goal: Knowledge of altered thought process  Outcome: Goal Ongoing     Problem: Violence, self/other-directed, Risk of  Goal: Absence of violence  Outcome: Goal Ongoing  Goal: Knowledge of risk for violence, self/other-directed  Outcome: Goal Ongoing     Problem: Transition Readiness  Goal: Knowledge of transition readiness  Outcome: Goal Ongoing     Problem: Cognitive-Perceptual Pattern - Impaired  Goal: Demonstration of organized thought processes  Outcome: Goal Ongoing  Goal: Knowledge of prescribed medication  Outcome: Goal Ongoing     Problem: Coping - Ineffective, Family  Goal: Effective Coping  Outcome: Goal Ongoing  Goal: Communicate with support staff  Outcome: Goal Ongoing  Goal: Knowledge of positive coping methods  Outcome: Goal Ongoing     Problem: Social Interaction - Impaired  Goal: Increased Social Interaction  Outcome: Goal Ongoing  Goal: Knowledge of social interaction  Outcome: Goal Ongoing

## 2021-03-17 NOTE — Progress Notes
Pt resting with eye closed, even unlabored respirations, offered breakfast, as pt ate a little of it before returning to resting position. ID bracelet placed, eduction offered on security code. Report provided to receiving nurse. Pt transferred via w/c with [employer] escort, pt cell phone sent to floor. Pt calm and slowly cooperative with requests but pleasant

## 2021-03-17 NOTE — Consults
General Consult Note      Admission Date: 03/16/2021                                                LOS: 0 days    Reason for Consult:   Medical comanagement    Consult type: Opinion with orders    Assessment/Plan   25 year old male admitted to inpatient psychiatry for substance dependence and schizoaffective disorder-bipolar type and suicidal ideation    Alcohol dependence:  Would add thimaine and folic acid    Hypertension: would resume carvedilol at 12.5mg  bid.      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  This episode was temporally related to the initiation of Clozapine.  He underwent noninvasive stress testing and cardiac MRI, that was unremarkable.  He was started on goal-directed therapy and had full recovery of his cardiac function with in 2 weeks.  He continued on goal-directed therapy for a while, but has not taken any kind of heart or blood pressure medicine for several months.  He is currently well compensated.    Methamphetamine use: Given his history of cardiomyopathy, continued use of stimulants has a higher risk for him.  It is possible that the observed heart failure episode was caused by stimulants.    Summary Recommendations:   -Start carvedilol 12.5 mg p.o. twice daily   -Monitor weight daily   -Ideally, he would follow a sodium diet    Thank you for involving me in his care.  Please not hesitate to call with any questions or concerns.  I can be reached on AMS connect or volt.    ______________________________________________________________________    History of Present Illness: Jamie [name] is a 25 y.o. adult who presents to [facility] as a walk-in.  He is stating that he desires to get off of methamphetamine and alcohol.  He has not consistently been taking his mood stabilizing medication which include Depakote and paliperidone.      He is not interested in answering very many questions.  He denies chest pain or shortness of breath.  He also denies orthopnea.  He recalls the hospitalization in 2018, when he had a heart failure.  He has had none of those symptoms.    Medical History:   Diagnosis Date   ? Alcohol dependence (HCC) 06/24/2019   ? Hypertension    ? Myocarditis (HCC)    ? Schizoaffective disorder, bipolar type (HCC)    ? Tobacco use disorder, moderate, dependence 12/04/2019     History reviewed. No pertinent surgical history.  Social History     Socioeconomic History   ? Marital status: Single     Spouse name: 0   ? Number of children: 0   ? Years of education: Not on file   ? Highest education level: Not on file   Occupational History   ? Not on file   Tobacco Use   ? Smoking status: Never Smoker   ? Smokeless tobacco: Current User   Vaping Use   ? Vaping Use: Some days   ? Substances: Nicotine, Flavoring   Substance and Sexual Activity   ? Alcohol use: Yes     Comment: 1-2 x/week   ? Drug use: Not Currently     Types: Marijuana   ? Sexual activity: Not on file  Other Topics Concern   ? Military Service No   ? Blood Transfusions No   ? Caffeine Concern No   ? Occupational Exposure No   ? Hobby Hazards No   ? Sleep Concern Not Asked   ? Stress Concern Not Asked   ? Weight Concern No   ? Special Diet No   ? Back Care No   ? Exercise No   ? Bike Helmet Not Asked   ? Seat Belt Not Asked   ? Self-Exams Not Asked   Social History Narrative   ? Not on file     Social Determinants of Health     Financial Resource Strain: Not on file   Food Insecurity: Not on file   Transportation Needs: Not on file   Stress: Not on file   Social Connections: Not on file   Housing Stability: Not on file     Vaping/E-liquid Use   ? Vaping Use Current Some Day User      Vaping/E-liquid Substances   ? Nicotine Yes    ? Flavored Yes            Family history reviewed; non-contributory  Allergies:  Clozapine and Haldol [haloperidol lactate]    Scheduled Meds:divalproex (DEPAKOTE ER) ER tablet 1,000 mg, 1,000 mg, Oral, QHS  risperiDONE (RisperDAL) tablet 2 mg, 2 mg, Oral, QHS    Continuous Infusion  s:  PRN and Respiratory Meds:acetaminophen Q6H PRN, calcium carbonate TID PRN, OLANZapine Q6H PRN **OR** OLANZapine Q6H PRN, polyethylene glycol 3350 QDAY PRN, traZODone QHS PRN    Review of Systems:  A 14 point review of systems was negative except for: Constitutional: positive for fatigue  Vital Signs:  Last Filed in 24 hours Vital Signs:  24 hour Range    BP: (P) 121/78 (05/17 2306)  Temp: (P) 36.5 ?C (97.7 ?F) (05/17 2306)  Pulse: (P) 64 (05/17 2306)  Respirations: (P) 20 PER MINUTE (05/17 2306)  SpO2: (P) 98 % (05/17 2306) BP: (P) 121/78  Temp:  [36.5 ?C (97.7 ?F)]   Pulse:  [64]   Respirations:  [20 PER MINUTE]   SpO2:  [98 %]      Physical Exam:  General:   youn male lying in a bed, easily arousable Young male, lying in a hospital/psychiatric bed.  He is asleep but easily arousable.  He answers questions in one-word statements, and only when directly asked, he answers questions with one word answers   Neck:  Supple, symmetrical, trachea midline, no adenopathy, thyroid: no enlargement/tenderness/nodules, no carotid bruit and no JVD  Lungs:  Clear to auscultation bilaterally  Heart:    Regular rate and rhythm, S1, S2 normal, no murmur, click rub or gallop  Abdomen:  Soft, non-tender.  Bowel sounds normal.  No masses.  No organomegaly. and no hepatojugular reflex, no fluid wave  Extremities:  Extremities normal, atraumatic, no cyanosis or edema    Lab/Radiology/Other Diagnostic Tests:  Pertinent labs reviewed  Pertinent radiology reviewed., EKG Reviewed   EKG shows sinus bradycardia with a normal axis and QTC of 408    Nolberto Hanlon, MD  Pager [phone]

## 2021-03-17 NOTE — Progress Notes
Mother notes that he has not been well this entire year and that this is his 4th admission. When he is not admitted, he is getting regular driving citations and is unable to function without constant supervision. She reports that he has numerous speeding tickets despite his license being revoked and has significant ongoing legal problems relating to these tickets. Mother reports that most recent ticket was yesterday and that car was towed and is now impounded. Mother reports he is unable to maintain this vehicle regardless of this.     She states that she does not believe he is capable of functioning and that he is a constant risk to himself and others d/t his recklessness with driving and ongoing mental illness. She reports that this year he has been the worst he has ever been and that he continues to decline. Mother reports that patient lives alone and that they typically see him on the weekends. She reports that in the past few weeks there has been ongoing signs of mania and that he has had a significant decline in ADLs and hygiene. Mother reports that patient will likely be homeless in the near future d/t ongoing behaviors at his apartment which she believes are directly related to his mania and illness. Notes that they received a final warning from his landlord 1.5 months ago and that he has had ongoing legal problems since then. She reports that she does not feel he has been able to maintain safety when he leaves locked psychiatry units. Mother states that patient is unable to live with parents as they have younger children in the home and they do not believe they are safe in patient's presence for extended periods of time.       Mother reports they are interested in pursuing ECT as a treatment at this point. She reports that they have already started paperwork to have ECT done. Also reports that she believes he requires placement in a facility given extent of inability to care for self and the ongoing risk he presents to himself and others.

## 2021-03-18 MED ORDER — RISPERIDONE 3 MG PO TAB
3 mg | Freq: Every evening | ORAL | 0 refills | Status: DC
Start: 2021-03-18 — End: 2021-03-23
  Administered 2021-03-19 – 2021-03-23 (×5): 3 mg via ORAL

## 2021-03-18 MED ORDER — IMS MIXTURE TEMPLATE
1250 mg | Freq: Every evening | ORAL | 0 refills | Status: DC
Start: 2021-03-18 — End: 2021-03-19
  Administered 2021-03-19 (×2): 1250 mg via ORAL

## 2021-03-18 NOTE — Progress Notes
PSYCHIATRIC NURSING ASSESSMENT     NAME:Jamie [name]             MRN: [id number]             DOB:December 11, 1995          AGE: 25 y.o.  ADMISSION DATE: 03/16/2021             DAYS ADMITTED: LOS: 0 days    Mental Status Exam  Legal Status: Voluntary Admission  General Appearance: Disheveled  Mood / Affect: Anxious, Elevated mood  Speech: Normal  Content Of Thought: Normal  Motor Activity: Normal  Flow of Thought: Racing thoughts  Insight / Judgment: Poor judgment, Poor insight  Behavior: Cooperative, Follows commands  Patient Strengths: Interpersonal relationships and supports, i.e. family, friends, peers, Exercising self-direction    Marzell rated anxiety 3/10. Denied depression, SI/HI, AVH, and pain. He has been present in the milieu and participating in groups throughout the day. Pt watches TV or naps during free times. He has appropriate interactions with peers. He is very pleasant and talkative on the unit. Pt says his sleep is "good" and appetite is "normal." He expressed that he has slept more than usual yesterday and today but that it was needed due to lack of sleep prior to admission. PRN nicotine gum administered per orders for cravings. Pt states his goal is to "cut back" on chewing tobacco eventually.     Encouraged him to come to staff with concerns. Pt is on 15 minute checks to monitor for safety and behaviors.     Renee Ramus, RN  03/18/2021

## 2021-03-18 NOTE — Care Plan
Problem: Health Maintenance - Impaired  Goal: Able to perfom ADL's  Outcome: Goal Ongoing  Goal: Adequate nutritional intake  Outcome: Goal Ongoing  Goal: Establish therapeutic relationships  Outcome: Goal Ongoing  Goal: Knowledge of health maintenance  Outcome: Goal Ongoing     Problem: Mood - Altered  Goal: Stabilize mood  Outcome: Goal Ongoing  Goal: Knowledge of Altered Mood  Outcome: Goal Ongoing     Problem: Self-esteem - Low  Goal: Demonstration of positive self-esteem  Outcome: Goal Ongoing  Goal: Knowledge of low self-esteem  Outcome: Goal Ongoing     Problem: Thought Process - Altered  Goal: Demonstration of organized thought processes  Outcome: Goal Ongoing  Goal: Knowledge of altered thought process  Outcome: Goal Ongoing     Problem: Violence, self/other-directed, Risk of  Goal: Absence of violence  Outcome: Goal Ongoing  Goal: Knowledge of risk for violence, self/other-directed  Outcome: Goal Ongoing     Problem: Transition Readiness  Goal: Knowledge of transition readiness  Outcome: Goal Ongoing     Problem: Cognitive-Perceptual Pattern - Impaired  Goal: Demonstration of organized thought processes  Outcome: Goal Ongoing  Goal: Knowledge of prescribed medication  Outcome: Goal Ongoing     Problem: Coping - Ineffective, Family  Goal: Effective Coping  Outcome: Goal Ongoing  Goal: Communicate with support staff  Outcome: Goal Ongoing  Goal: Knowledge of positive coping methods  Outcome: Goal Ongoing     Problem: Social Interaction - Impaired  Goal: Increased Social Interaction  Outcome: Goal Ongoing  Goal: Knowledge of social interaction  Outcome: Goal Ongoing

## 2021-03-18 NOTE — Progress Notes
Chart check completed

## 2021-03-18 NOTE — Progress Notes
[facility]  Initial Clinical Assessment    NAME:Jamie [name] MRN: [id number] DOB:1996-08-20 AGE: 25 y.o.  ADMISSION DATE: 03/16/2021 DAYS ADMITTED: LOS: 0 days    Date of Service: 03/18/21    Active Problems:    Schizoaffective disorder, bipolar type (HCC)    Tobacco use disorder, severe, dependence    Withdrawal from methamphetamine [facility])    Methamphetamine use disorder, moderate (HCC)    Reason for Admission: Patient presented to [facility] with manic symptoms in the context of methamphetamine use.  Patient has a long history with inpatient services at [facility] (his most recent admission was on 11/28/20.      Therapist approached Patient in the Milieu and he agreed to a session; it took place in the Quiet Room.  Eye contact mildly intense.  Affect full; mood congruent.  Patient was cooperative and talkative.      Patient's Description of Problem: I can't sleep for days.  I slept 15 hours in a week-and-a-half.  Patient then reported he has been using methamphetamines for the same amount of time.      Recent Changes or Stressors: Met a couple in a casino and invited them to his apartment. They are reportedly the source of his methamphetamines.    Patient also reported that his vehicle has been towed and that his parents/guardians refuse to help him pay off the fine.  Patient added that a breathalyzer has been installed in his car.      Patient reported working nights at an [employer] and then working days at [employer].  Part of his reason for using methamphetamines was to stay awake during his night shift.      Current Safety Concerns:   Current Safety Concerns - Self: none  Current Safety Concerns - Others: none  Psychoses: visual hallucinations  Delusions: none  Lethal Means: Patient does not report any access to firearms.    Trauma History: According to the chart, Patient's mother died when he was [age] years old.  He has also made suicide attempts in the past.  He has been hospitalized numerous times for chronic and severe mental health challenges.    Substance Use:  Patient declined to go into details at this time.      Family History:  Family history of mental health diagnosis? Great uncle--schizophrenia    Family history of substance use? Per chart, Patient has denied a family history of significant SU.    Internal Strengths: At least average intelligence, Motivated for treatment and has a consistent desire to work and earn an income    External Supports: family support, has guardian, known to psychiatry staff, independent community living and has mental health follow up     Limitations: financial situation, impulsive, limited insight into mental illness and Patient historically makes questionable decisions and likely has been taken advantage of by others    Therapy Aftercare Recommendations: Patient would benefit from medication management, case management, outpatient individual counseling, outpatient family counseling, an intensive outpatient program and Narcotics Anonymous groups to address ongoing mental health concerns made worse by substance use.    Mental Status Exam    Observations:  Appearance: neat  Speech: pressured and tangential  Eye Contact: intense    Behavior: calm / cooperative    Mood: elevated and other: and mildly irritable  Affect: Full    Cognition:  Orientation Impairment: no noted impairment  Memory Impairment: no noted impairment    Thought Process: WNL and linear  Insight: Poor  Judgment: Poor    Comments: Patient reported that if his parents attempt to put him into some kind of a facility, I will fight every day to get out.

## 2021-03-18 NOTE — Behavioral Health Treatment Team
TREATMENT TEAM NOTE  Name: Heriberto Stmartin        MRN: [id number]          DOB: Dec 12, 1995            Age: 25 y.o.  Admission Date: 03/16/2021       LOS: 0 days    Date of Service: 03/18/2021        Attendees:  UR: Ival Bible, LMSW  Nursing: Doristine Mango, RN  Nursing: Freddie Apley, RN, BSN  Therapy: Clement Husbands, [place]  Therapy: Marijean Heath, LMLP, LCP  CM: Henderson Cloud, LMSW  CM: Neysa Bonito, [facility]  Rec Therapy: Clare Charon, CTRS  Nurse Manager: Ursula Beath, RN  Pharmacy: Dario Guardian, PharmD  Psychology: Assunta Gambles. Lowry Bowl, PhD  Psychology: Peter Minium, PhD  Psychiatry: Frankey Poot, APRN-NP  Psychiatry Resident: Orest Dikes, DO  Psychiatry Resident: Maura Crandall, DO      Significant Points Discussed: Present in the milieu, did not attend groups, isolating yesterday.     Treatment Plan Discussion: Continue treatment

## 2021-03-18 NOTE — Care Coordination-Inpatient
Care Coordination - Inpatient Note      Patient Name:   Bayani Renteria                        MRN:  [id number]  Admission Date:  03/16/2021     10:20 am  CM called Pt's guardian, Daltin Crist [phone]), to provide update on Pt and gather collateral information. [name] reports that this is Pt's fourth psychiatric hospitalization this year. She is not sure of the exact circumstances that brought Pt to decide to come to [facility] but she believes it has something to do with medication non-compliance and substance use. She reports that Pt has a DUI from earlier this year and three driving while suspended tickets since then. She reports that his car has been towed by police and Pt will not be getting it back. She states that he has court at the end of this month for one of these tickets. She also states that she spoke with the doctor about starting ECT for Pt as he seems to be resistant to most medications. CM will follow up with treatment team regarding ECT.     [name] reports that she and Pt's father are concerned about Pt's functioning as they feel that he is no longer able to care for himself as he used to. His hygiene has declined, he has not been able to keep a job, he is not taking his medications as prescribed, he has starting using substances much more frequently, and he has had an increased amount of contact with law enforcement. [name] requested that CM and treatment team consider trying a Level 2 placement again before Pt's functioning decreases more. CM will fdiscuss with treatment team and follow up [facility].     [employer]  03/18/2021

## 2021-03-18 NOTE — Progress Notes
Chart check completed.

## 2021-03-18 NOTE — Progress Notes
PSYCHIATRIC PROGRESS NOTE     LOS: 0 days  Voluntary/Involuntary Status: Voluntary  Guardian? Parents (Jamie [name], Jamie [name])     MEDICATIONS     Current Psychotropic Medications:   1. Risperdal 3 mg QHS   2. Depakote ER 1250 mg QHS     ASSESSMENT & DIAGNOSES   Jamie [name] is a 25 y.o. Caucasian adult with a history of schizoaffective disorder and polysubstance abuse who presented to [facility] as a transfer from OSH with manic symptoms in context of methamphetamine use. This appears to be precipitated by methamphetamine use. Factors that seem to have predisposed him to mania include schizoaffective disorder, substance abuse and uncle with schizophrenia. This current problem is maintained by ongoing substance abuse, lack of consistent medication compliance, and poor insight. However, protective factors include good support from parents who are guardians, motivation to seek treatment, and fair insight into current situation. Proposed treatment will consist of substance detoxification and medication management.    DSM-5 DIAGNOSES:  1. Stimulant use disorder, methamphetamine type, moderate  2. Methamphetamine withdrawal  3. Schizoaffective disorder, bipolar type  4. Tobacco use disorder (chewing tobacco), severe  5. H/o cocaine abuse     PLAN     ? Increase Risperidone 3 mg QHS for schizoaffective disorder and mood stabilization  ? Plan to initiate long-acting injectable Jamie [name] on 03/19/2021  ? Increase Depakote ER 1250 mg QHS for schizoaffective disorder and mood stabilization  ? Will perform AIMS this admission.  ? Discuss medication changes and disposition with mother (legal guardian). She agrees with plan to start long-acting injectable. Mother was informed that the patient is asking about discharge. She would like for the patient to be hospitalized until stable. Thus, the patient cannot leave against medical advice at this time.       Metabolic labs completed on:  Lipid Panel:   LDL   Date Value Ref Range Status   11/30/2020 107 (H) <100 mg/dL Final     HDL   Date Value Ref Range Status   11/30/2020 61 >40 MG/DL Final     VLDL   Date Value Ref Range Status   11/30/2020 37 MG/DL Final     Cholesterol   Date Value Ref Range Status   11/30/2020 185 <200 MG/DL Final     Triglycerides   Date Value Ref Range Status   11/30/2020 186 (H) <150 MG/DL Final     Blood Glucose:  Hemoglobin A1C   Date Value Ref Range Status   11/30/2020 5.3 4.0 - 6.0 % Final     Comment:     The ADA recommends that most patients with type 1 and type 2 diabetes maintain   an A1c level <7%.       Disposition: Patient has legal guardian. He is unable to leave AMA unless approved by guardian. Pending evaluation for placement.  ?  Seen and discussed with Dr. Lajean [name].  ______________________________________________________________     SUBJECTIVE     Per medical student's encounter with the patient, he was seen today in his room. He is still very tired and needed to be woken up several times during our conversation. He denies SI/HI/AVH. States his appetite is good and mood is okay. Reports he has tried LAI Invega in the past and is willing to start it again.    When evaluated by the resident, Mr. Jamie [name] was found eating breakfast in the group room. He reports that he is feeling okay today. He states that he  has some anxiety related to his hospitalization and when he will be discharged. He explains that his car was impounded prior to his admission and he has some concerns about getting it out given daily rate of $60. He does not feel that his parents will assist in this. He slept well last night and nursing report indicates that he was observed sleeping for 7.5 hours. His appetite remains good. Patient has plans to rid his life of toxic individuals. When asked to elaborate, he reports that he has a few people living with him that he met 1.5 weeks ago. He [place] these individuals at the casino and offered temporary housing to them. However, they have been using methamphetamine daily and the patient has started to use this substance as well. He plans to tell them to fuck off when he leaves the hospital. He continues to have visual hallucinations of colorful dots, floaters; its a nonstop light show. He denies any auditory hallucinations, suicidal ideation, and homicidal ideation. Discussed plans to increase risperidone and depakote. Patient again reports that he was noncompliant with medications prior to arrival. Offered long-acting injectable Jamie [name] and patient agrees. He has been attending and actively participating in group therapy on the unit.      REVIEW OF SYSTEMS   Review of Systems   Constitutional: Negative for activity change.   HENT: Negative for sore throat.    Eyes: Negative for pain.   Respiratory: Negative for shortness of breath.    Cardiovascular: Negative for chest pain.   Gastrointestinal: Negative for constipation and diarrhea.   Genitourinary: Negative for dysuria.   Musculoskeletal: Negative for myalgias.   Neurological: Negative for dizziness and headaches.   Psychiatric/Behavioral: Negative for agitation.      OBJECTIVE                    Vital Signs:  Current                Vital Signs: 24 Hour Range   BP: 136/62 (05/19 0800)  Temp: 37.2 ?C (99 ?F) (05/19 0800)  Pulse: 80 (05/19 0800)  Respirations: 18 PER MINUTE (05/19 0800)  SpO2: 95 % (05/19 0800) BP: (127-136)/(62-68)   Temp:  [37 ?C (98.6 ?F)-37.2 ?C (99 ?F)]   Pulse:  [80-102]   Respirations:  [18 PER MINUTE]   SpO2:  [95 %-98 %]    Intensity Pain Scale (Self Report): (not recorded)      Sleep:  Hours of Sleep: 7.50   Quality of Sleep: Resting Quietly    Scheduled Medications:  divalproex (DEPAKOTE ER) ER tablet 1,000 mg, 1,000 mg, Oral, QHS  nicotine (NICODERM CQ STEP 1) 21 mg/day patch 1 patch, 1 patch, Transdermal, QDAY  risperiDONE (RisperDAL) tablet 2 mg, 2 mg, Oral, QHS    PRN Medications:  acetaminophen Q6H PRN, calcium carbonate TID PRN, nicotine polacrilex Q2H PRN 4 mg at 03/17/21 2114, OLANZapine Q6H PRN **OR** OLANZapine Q6H PRN, polyethylene glycol 3350 QDAY PRN, traZODone QHS PRN    Mental Status Exam:  ? General/Constitutional: 25 y.o. adult, appears stated age, dressed in hospital clothes, appropriate grooming  ? Behavior: calm, cooperative, mostly pleasant,  ? Speech/Motor: normal rate, volume, tone, uses foul language throughout interview  ? Eye Contact: fair  ? Mood: okay  ? Affect: euthymic, cheerful, congruent with stated mood,   ? Thought Process: linear  ? Associations: Intact  ? Thought Content: Denies SI/HI  ? Perception: AH, denies VH no paranoia or delusion  ?  Insight/Judgment: fair/poor  ? Orientation: Alert and oriented  ? Recent and Remote Memory: grossly intact  ? Attention span and concentration: poor  ? Language: fluent  ? Fund of knowledge and vocabulary: average    Focused Physical Exam:  ? Eyes: EOMI, no scleral icterus noted  ? Extremities/Musculoskeletal:  Moves all extremities through full active ROM  ? Gait ambulates independently without difficulties    ______________________________________________________________  [name] - Medical Student MS3  Orest Dikes, DO - Psychiatry Resident PGY1

## 2021-03-19 MED ORDER — NON FORMULARY
Freq: Once | INTRAMUSCULAR | 5 refills | Status: DC
Start: 2021-03-19 — End: 2021-03-19

## 2021-03-19 MED ORDER — PALIPERIDONE PALMITATE 156 MG/ML IM SYRG
156 mg | Freq: Once | INTRAMUSCULAR | 0 refills | Status: CP
Start: 2021-03-19 — End: ?
  Administered 2021-03-19: 21:00:00 156 mg via INTRAMUSCULAR

## 2021-03-19 MED ORDER — DIVALPROEX 500 MG PO TB24
2000 mg | Freq: Every evening | ORAL | 0 refills | Status: DC
Start: 2021-03-19 — End: 2021-03-21
  Administered 2021-03-20 – 2021-03-21 (×2): 2000 mg via ORAL

## 2021-03-19 NOTE — Care Plan
Problem: Mood - Altered  Goal: Stabilize mood  Outcome: Goal Ongoing  Goal: Knowledge of Altered Mood  Outcome: Goal Ongoing     Problem: Coping - Ineffective, Family  Goal: Communicate with support staff  Outcome: Goal Ongoing

## 2021-03-19 NOTE — Progress Notes
PSYCHIATRIC PROGRESS NOTE     LOS: 0 days  Voluntary/Involuntary Status: Voluntary  Guardian? Parents (Jamie [name], Jamie [name])     MEDICATIONS     Current Psychotropic Medications:   1. Invega Sustenna IM every 28 days  2. Risperdal 3 mg QHS   3. Depakote ER 2000 mg QHS     ASSESSMENT & DIAGNOSES   Jamie [name] is a 25 y.o. Caucasian adult with a history of schizoaffective disorder and polysubstance abuse who presented to [facility] as a transfer from OSH with manic symptoms in context of methamphetamine use. This appears to be precipitated by methamphetamine use. Factors that seem to have predisposed him to mania include schizoaffective disorder, substance abuse and uncle with schizophrenia. This current problem is maintained by ongoing substance abuse, lack of consistent medication compliance, and poor insight. However, protective factors include good support from parents who are guardians, motivation to seek treatment, and fair insight into current situation. Proposed treatment will consist of substance detoxification and medication management.    DSM-5 DIAGNOSES:  1. Stimulant use disorder, methamphetamine type, moderate  2. Methamphetamine withdrawal  3. Schizoaffective disorder, bipolar type  4. Tobacco use disorder (chewing tobacco), severe  5. Stimulant use disorder, cocaine type, unspecified severity     PLAN     ? Initiate Invega Sustenna 156 mg IM today. Next dose in one week.  ? Continue Risperidone 3 mg QHS for schizoaffective disorder and mood stabilization  ? Plan to initiate long-acting injectable Gean Birchwood on 03/19/2021  ? Increase Depakote ER 2000 mg QHS for schizoaffective disorder and mood stabilization  ? Will perform AIMS this admission.  ? Discuss medication changes and disposition with mother (legal guardian). She agrees with plan to start long-acting injectable. Mother was informed that the patient is asking about discharge. She would like for the patient to be hospitalized until stable. Thus, the patient cannot leave against medical advice at this time.       Metabolic labs completed on:  Lipid Panel:   LDL   Date Value Ref Range Status   11/30/2020 107 (H) <100 mg/dL Final     HDL   Date Value Ref Range Status   11/30/2020 61 >40 MG/DL Final     VLDL   Date Value Ref Range Status   11/30/2020 37 MG/DL Final     Cholesterol   Date Value Ref Range Status   11/30/2020 185 <200 MG/DL Final     Triglycerides   Date Value Ref Range Status   11/30/2020 186 (H) <150 MG/DL Final     Blood Glucose:  Hemoglobin A1C   Date Value Ref Range Status   11/30/2020 5.3 4.0 - 6.0 % Final     Comment:     The ADA recommends that most patients with type 1 and type 2 diabetes maintain   an A1c level <7%.       Disposition: Patient has legal guardian. He is unable to leave AMA unless approved by guardian. Pending evaluation for placement.  ?  Seen and discussed with Dr. Lajean Saver.  ______________________________________________________________     SUBJECTIVE     Jamie [name] is found sleeping in his hospital room this morning. He reports that he slept well last night and nursing reports that he slept for 7 hours. His appetite remains good and is unchanged from previous interview. He denies any depression or anxiety at this time. He continues to have chronic visual hallucinations and denies any auditory hallucinations. He denies any  suicidal ideation or homicidal ideation. Continue to encourage the patient that he may be placed on discharge.    Case management discussed the patient's case with mother yesterday. Mother voices significant concern about his wellbeing at discharge and feels that he needs to be placed at Level 2 facility. He has recent history of multiple legal problems as well as inability to maintain good hygiene. Therapy reports that the patient is not displaying delusions that have been present on previous hospitalizations. He has been withdrawn and minimally interacting with peers. However, he has been attending group therapy.      REVIEW OF SYSTEMS   Review of Systems   Constitutional: Negative for activity change.   HENT: Negative for sore throat.    Eyes: Negative for pain.   Respiratory: Negative for shortness of breath.    Cardiovascular: Negative for chest pain.   Gastrointestinal: Negative for constipation and diarrhea.   Genitourinary: Negative for dysuria.   Musculoskeletal: Negative for myalgias.   Neurological: Negative for dizziness and headaches.   Psychiatric/Behavioral: Negative for agitation.      OBJECTIVE                    Vital Signs:  Current                Vital Signs: 24 Hour Range   BP: 143/87 (05/19 1900)  Temp: 37.1 ?C (98.7 ?F) (05/19 1900)  Pulse: 108 (05/19 1900)  Respirations: 18 PER MINUTE (05/19 1900)  SpO2: 97 % (05/19 1900) BP: (136-143)/(62-87)   Temp:  [37.1 ?C (98.7 ?F)-37.2 ?C (99 ?F)]   Pulse:  [80-108]   Respirations:  [18 PER MINUTE]   SpO2:  [95 %-97 %]    Intensity Pain Scale (Self Report): (not recorded)      Sleep:  Hours of Sleep: 7.50   Quality of Sleep: Resting Quietly    Scheduled Medications:  divalproex ER (DEPAKOTE ER) tablet 1,250 mg, 1,250 mg, Oral, QHS  nicotine (NICODERM CQ STEP 1) 21 mg/day patch 1 patch, 1 patch, Transdermal, QDAY  risperiDONE (RisperDAL) tablet 3 mg, 3 mg, Oral, QHS    PRN Medications:  acetaminophen Q6H PRN, calcium carbonate TID PRN, nicotine polacrilex Q2H PRN 4 mg at 03/18/21 2119, OLANZapine Q6H PRN **OR** OLANZapine Q6H PRN, polyethylene glycol 3350 QDAY PRN, traZODone QHS PRN    Mental Status Exam:  ? General/Constitutional: 25 y.o. adult, appears stated age, dressed in hospital clothes, appropriate grooming  ? Behavior: calm, cooperative, mostly pleasant,  ? Speech/Motor: normal rate, volume, tone, uses foul language throughout interview  ? Eye Contact: fair  ? Mood: same, okay I guess  ? Affect: euthymic, cheerful, congruent with stated mood,   ? Thought Process: linear  ? Associations: Intact  ? Thought Content: Denies SI/HI  ? Perception: AH, denies VH no paranoia or delusion  ? Insight/Judgment: fair/poor  ? Orientation: Alert and oriented  ? Recent and Remote Memory: grossly intact  ? Attention span and concentration: poor  ? Language: fluent  ? Fund of knowledge and vocabulary: average    Focused Physical Exam:  ? Eyes: EOMI, no scleral icterus noted  ? Extremities/Musculoskeletal:  Moves all extremities through full active ROM  ? Gait ambulates independently without difficulties    ______________________________________________________________  [name] - Medical Student MS3  Orest Dikes, DO - Psychiatry Resident PGY1

## 2021-03-19 NOTE — Behavioral Health Treatment Team
TREATMENT TEAM NOTE  Name: Jamie [name]        MRN: [id number]          DOB: [birth year]            Age: 25 y.o.  Admission Date: 03/16/2021       LOS: 0 days    Date of Service: 03/19/2021        Attendees:  UR: Ival Bible, LMSW  Nursing: Freddie Apley, RN, BSN  Therapy: Clement Husbands, Rosa, [facility]  Therapy: Marijean Heath, LMLP, LCP  CM: Neysa Bonito, LPC  CM: Eden Lathe, LPC  Rec Therapy: Michaell Cowing, CTRS  Music Therapy: Berle Mull, MTBC, LPC  Pharmacy: Hazle Quant, PharmD  Psychiatry: Frankey Poot, APRN-NP  Psychiatry Resident: Orest Dikes, DO  Psychiatry Resident: Maura Crandall, DO    Significant Points Discussed: endorses visual hallucinations, engaged in groups, working on legal issues with DUIs, parents are guardians and want a level 2 placement     Treatment Plan Discussion: continue treatment, possible discharge early next week

## 2021-03-19 NOTE — Care Plan
Problem: Health Maintenance - Impaired  Goal: Able to perfom ADL's  Outcome: Goal Ongoing  Goal: Adequate nutritional intake  Outcome: Goal Ongoing  Goal: Establish therapeutic relationships  Outcome: Goal Ongoing  Goal: Knowledge of health maintenance  Outcome: Goal Ongoing     Problem: Mood - Altered  Goal: Stabilize mood  Outcome: Goal Ongoing  Goal: Knowledge of Altered Mood  Outcome: Goal Ongoing     Problem: Self-esteem - Low  Goal: Demonstration of positive self-esteem  Outcome: Goal Ongoing  Goal: Knowledge of low self-esteem  Outcome: Goal Ongoing     Problem: Thought Process - Altered  Goal: Demonstration of organized thought processes  Outcome: Goal Ongoing  Goal: Knowledge of altered thought process  Outcome: Goal Ongoing     Problem: Violence, self/other-directed, Risk of  Goal: Absence of violence  Outcome: Goal Ongoing  Goal: Knowledge of risk for violence, self/other-directed  Outcome: Goal Ongoing     Problem: Transition Readiness  Goal: Knowledge of transition readiness  Outcome: Goal Ongoing     Problem: Cognitive-Perceptual Pattern - Impaired  Goal: Demonstration of organized thought processes  Outcome: Goal Ongoing  Goal: Knowledge of prescribed medication  Outcome: Goal Ongoing     Problem: Coping - Ineffective, Family  Goal: Effective Coping  Outcome: Goal Ongoing  Goal: Communicate with support staff  Outcome: Goal Ongoing  Goal: Knowledge of positive coping methods  Outcome: Goal Ongoing     Problem: Social Interaction - Impaired  Goal: Increased Social Interaction  Outcome: Goal Ongoing  Goal: Knowledge of social interaction  Outcome: Goal Ongoing

## 2021-03-19 NOTE — Progress Notes
PSYCHIATRIC NURSING ASSESSMENT     NAME:Jamie [name]             MRN: [id number]             DOB:[birth year]          AGE: 25 y.o.  ADMISSION DATE: 03/16/2021             DAYS ADMITTED: LOS: 0 days    Mental Status Exam  Legal Status: Voluntary Admission  General Appearance: Disheveled  Mood / Affect: Flat affect  Speech: Normal  Content Of Thought: Normal  Motor Activity: Normal  Flow of Thought: Normal  Insight / Judgment: Poor insight, Poor judgment  Behavior: Cooperative, Follows commands  Patient Strengths: Interpersonal relationships and supports, i.e. family, friends, peers, Setting and pursuing goals, Exercising self-direction    [name] denies depression, anxiety, SI/HI, AVH, and pain. He says his mood today is "I'm fine." Pt spent time resting during the morning. He was present in the milieu this afternoon. He interacts well with peers, but he can be intrusive at times. He does well with redirection. Pt has many future goals.     Encouraged him to come to staff with concerns. Pt is on 15 minute checks to monitor for safety and behaviors.     Renee Ramus, RN  03/19/2021

## 2021-03-19 NOTE — Progress Notes
Pharmacy Long-Acting Injectable Psychotropic Medication Note    For patient Jamie [name] (MRN: [id number]), a non-formulary medication request was received for Jamie [name].  This medication is requested as continuation from outpatient therapy.      Continuation from Outpatient Therapy:   Outpatient coverage verification:  Yes   Future outpatient administration of this medication will occur as follows:   Outpatient Location: Jamie [name]   Prescriber: TBD   Next Administration Date: Additional 156 mg injection due within one week of 5/20... followed by 156 Q4wk from that second injection date.     Pharmacy recommends use of Gean Birchwood during this hospitalization based on the above established criteria for long-acting injectable psychotropic medications for inpatient use.      Hazle Quant, [name]  Clinical Pharmacist  03/19/2021

## 2021-03-19 NOTE — Progress Notes
Chart check completed.

## 2021-03-19 NOTE — Progress Notes
Psych RN Shift Assessment    Pt Admission Date:  03/16/2021 10:05 AM    Admitting Diagnosis: Manic, AVH      Pt was observed with neutral affect. Pt speech was normal pitch and normal volume. Pt appeared to have WNL thought.  Pt was  oriented to person, place, time, situation. Pt did not appear to be responding to internal stimuli.  Pt did not appear to be making delusional statements.     Pt appeared to be calm and cooperative yet somewhat withdrawn. Pt was observed spending the vast majority of the shift in the day room watching television, but was interacting minimally with other patients.  Pt appeared to have somewhat flat and guarded affect.     MENTAL STATUS EXAM    Mental Status Exam  Legal Status: Voluntary Admission  General Appearance: Disheveled  Mood / Affect: Anxious  Speech: Normal  Content Of Thought: Guarded  Motor Activity: Normal  Flow of Thought: Linear  Insight / Judgment: Poor judgment, Poor insight  Behavior: Calm, Cooperative, Follows commands  Patient Strengths: Interpersonal relationships and supports, i.e. family, friends, peers, Exercising self-direction      Depression/Anxiety: Pt rated depression as being 0/10 and anxiety being 3/10.       Thoughts/urges to hurt themselves of someone else:  Pt denies thoughts or urges to hurt themselves. Pt denies thoughts or urges to hurt others.    Suicidal Ideation/Homicidal ideation: Pt  denies suicidal ideation. Pt denies homicidal ideations. Pt reports  being able to contract for safety.     Audio/Visual Hallucinations:  Pt reports audio/ visual hallucinations.   Pt reports having visual hallucinations in the form of, 'floaters and stars.' Pt stated that these are, 'normal for me. They're always there.'     Pt Vital signs were assessed:  Vitals*  Pulse: 108  BP Patient Position: Chair  BP: (!) 143/87  Respirations: 18 PER MINUTE  SpO2: 97 %  SpO2 Location: Finger, Second (Index)    Medical Concerns: Pt denies  having new medical concerns.     Pain: Pt  denies having pain.    Pt did take all medications as prescribed.     PRN Medications Available and when they were last given:     acetaminophen Q6H PRN, calcium carbonate TID PRN, nicotine polacrilex Q2H PRN 4 mg at 03/18/21 2119, OLANZapine Q6H PRN **OR** OLANZapine Q6H PRN, polyethylene glycol 3350 QDAY PRN, traZODone QHS PRN      Pt will continue to be monitored.

## 2021-03-19 NOTE — Progress Notes
Chart check completed

## 2021-03-20 NOTE — Care Plan
Problem: Health Maintenance - Impaired  Goal: Establish therapeutic relationships  Outcome: Goal Ongoing     Problem: Mood - Altered  Goal: Stabilize mood  Outcome: Goal Ongoing     Problem: Coping - Ineffective, Family  Goal: Effective Coping  Outcome: Goal Ongoing  Goal: Communicate with support staff  Outcome: Goal Ongoing     Problem: Social Interaction - Impaired  Goal: Increased Social Interaction  Outcome: Goal Ongoing

## 2021-03-20 NOTE — Progress Notes
PSYCHIATRIC PROGRESS NOTE     LOS: 0 days  Voluntary/Involuntary Status: Voluntary  Guardian: Parents (Jamie [name], Jamie [name])     MEDICATIONS     Current Psychotropic Medications:   1. Invega Sustenna IM every 28 days  2. Risperdal 3 mg QHS   3. Depakote ER 2000 mg QHS     ASSESSMENT & DIAGNOSES   Jamie [name] is a 25 y.o. Caucasian adult with a history of schizoaffective disorder and polysubstance abuse who presented to [facility] as a transfer from OSH with manic symptoms in context of methamphetamine use. This appears to be precipitated by methamphetamine use. Factors that seem to have predisposed him to mania include schizoaffective disorder, substance abuse and uncle with schizophrenia. This current problem is maintained by ongoing substance abuse, lack of consistent medication compliance, and poor insight. However, protective factors include good support from parents who are guardians, motivation to seek treatment, and fair insight into current situation. Proposed treatment will consist of substance detoxification and medication management.    DSM-5 DIAGNOSES:  1. Stimulant use disorder, methamphetamine type, moderate  2. Methamphetamine withdrawal  3. Schizoaffective disorder, bipolar type  4. Tobacco use disorder (chewing tobacco), severe  5. Stimulant use disorder, cocaine type, unspecified severity     PLAN     ? Weekend update: no med changes made on 5/21. Continue to monitor.   ? Received Invega Sustenna 156 mg IM 5/20. Next dose in one week.  ? Continue Risperidone 3 mg QHS for schizoaffective disorder and mood stabilization  ? Continue Depakote ER 2000 mg QHS for schizoaffective disorder and mood stabilization  ? Will perform AIMS this admission.  ? Discuss medication changes and disposition with mother (legal guardian). She agrees with plan to start long-acting injectable. Mother was informed that the patient is asking about discharge. She would like for the patient to be hospitalized until stable. Thus, the patient cannot leave against medical advice at this time.       Metabolic labs completed on:  Lipid Panel:   LDL   Date Value Ref Range Status   03/19/2021 106 (H) <100 mg/dL Final     HDL   Date Value Ref Range Status   03/19/2021 55 >40 MG/DL Final     VLDL   Date Value Ref Range Status   03/19/2021 15 MG/DL Final     Cholesterol   Date Value Ref Range Status   03/19/2021 174 <200 MG/DL Final     Triglycerides   Date Value Ref Range Status   03/19/2021 76 <150 MG/DL Final     Blood Glucose:  Hemoglobin A1C   Date Value Ref Range Status   03/19/2021 5.3 4.0 - 6.0 % Final     Comment:     The ADA recommends that most patients with type 1 and type 2 diabetes maintain   an A1c level <7%.       Disposition: Patient has legal guardian. He is unable to leave AMA unless approved by guardian. Pending evaluation for placement.  ?  Seen and discussed with Dr. Vernon Prey.  ______________________________________________________________     SUBJECTIVE     Mr. Jamie [name] was seen during breakfast this morning. He says that he slept well last night. His appetite remains good. He denies any mood concerns. Still continues to have chronic visual hallucinations. Denies any issues with the increase in Depakote or Invega injection. Reports he is unhappy about the idea of going to a facility, but understands his guardianship. Denies  SI/HI.     REVIEW OF SYSTEMS   Review of Systems   Constitutional: Negative for activity change.   HENT: Negative for sore throat.    Eyes: Negative for pain.   Respiratory: Negative for shortness of breath.    Cardiovascular: Negative for chest pain.   Gastrointestinal: Negative for constipation and diarrhea.   Genitourinary: Negative for dysuria.   Musculoskeletal: Negative for myalgias.   Neurological: Negative for dizziness and headaches.   Psychiatric/Behavioral: Negative for agitation.      OBJECTIVE                    Vital Signs:  Current                Vital Signs: 24 Hour Range   BP: 126/56 (05/21 0700)  Temp: 37 ?C (98.6 ?F) (05/21 0700)  Pulse: 82 (05/21 0700)  Respirations: 16 PER MINUTE (05/21 0700)  SpO2: 96 % (05/21 0700) BP: (123-126)/(56-83)   Temp:  [37 ?C (98.6 ?F)]   Pulse:  [82-93]   Respirations:  [16 PER MINUTE]   SpO2:  [96 %-98 %]    Intensity Pain Scale (Self Report): (not recorded)      Sleep:  Hours of Sleep: 6.25   Quality of Sleep: Resting Quietly    Scheduled Medications:  divalproex (DEPAKOTE ER) ER tablet 2,000 mg, 2,000 mg, Oral, QHS  nicotine (NICODERM CQ STEP 1) 21 mg/day patch 1 patch, 1 patch, Transdermal, QDAY  risperiDONE (RisperDAL) tablet 3 mg, 3 mg, Oral, QHS    PRN Medications:  acetaminophen Q6H PRN, calcium carbonate TID PRN, nicotine polacrilex Q2H PRN 4 mg at 03/19/21 2049, OLANZapine Q6H PRN **OR** OLANZapine Q6H PRN, polyethylene glycol 3350 QDAY PRN, traZODone QHS PRN    Mental Status Exam:  ? General/Constitutional: 25 y.o. adult, appears stated age, dressed in hospital clothes, appropriate grooming  ? Behavior: calm, cooperative, mostly pleasant,  ? Speech/Motor: normal rate, volume, tone, uses foul language throughout interview  ? Eye Contact: fair  ? Mood: pretty good  ? Affect: euthymic, cheerful, congruent with stated mood,   ? Thought Process: linear  ? Associations: Intact  ? Thought Content: Denies SI/HI  ? Perception: AH, denies VH no paranoia or delusion  ? Insight/Judgment: fair/poor  ? Orientation: Alert and oriented  ? Recent and Remote Memory: grossly intact  ? Attention span and concentration: poor  ? Language: fluent  ? Fund of knowledge and vocabulary: average    Focused Physical Exam:  ? Eyes: EOMI, no scleral icterus noted  ? Extremities/Musculoskeletal:  Moves all extremities through full active ROM  ? Gait ambulates independently without difficulties    ______________________________________________________________  [name] - Medical Student MS3    A medical student assisted me in the completion of this note. I performed my own evaluation of this patient, and agree with the student's above assessment.     Nolen Mu M.D.  Internal Medicine/Psychiatry, PGY-1  Pager: [phone]    03/20/2021 3:47 PM

## 2021-03-20 NOTE — Progress Notes
PSYCHIATRIC NURSING ASSESSMENT     NAME:Jamie [name]             MRN: [id number]             DOB:05-31-1996          AGE: 25 y.o.  ADMISSION DATE: 03/16/2021             DAYS ADMITTED: LOS: 0 days    Mental Status Exam  Legal Status: Voluntary Admission  General Appearance: Normal  Mood / Affect: Elevated mood  Speech: Normal  Content Of Thought: Normal  Motor Activity: Normal  Flow of Thought: Normal  Insight / Judgment: Poor judgment, Poor insight  Behavior: Cooperative, Follows commands  Patient Strengths: Exercising self-direction, Knowledge of medications, Vocational interests, i.e. hobbies    Dennise denies depression, anxiety, SI/HI, AVH, and pain. Pt reports his mood today is "well rested and good." Pt says that his appetite is "great!" He states that he has not been talking to his parents as much as usual due to them declining to help to get his car that was towed. He attends some groups during the day. Interactions with peers have been appropriate. Pt makes jokes with staff and talks in free time. He does well with advocating for himself and his needs. PRN nicotine gum administered per orders for cravings.     Encouraged him to come to staff with concerns. Pt is on 15 minute checks to monitor for safety and behaviors.     Renee Ramus, RN  03/20/2021

## 2021-03-20 NOTE — Care Plan
Problem: Health Maintenance - Impaired  Goal: Able to perfom ADL's  Outcome: Goal Ongoing  Goal: Adequate nutritional intake  Outcome: Goal Ongoing  Goal: Establish therapeutic relationships  Outcome: Goal Ongoing  Goal: Knowledge of health maintenance  Outcome: Goal Ongoing     Problem: Mood - Altered  Goal: Stabilize mood  Outcome: Goal Ongoing  Goal: Knowledge of Altered Mood  Outcome: Goal Ongoing     Problem: Self-esteem - Low  Goal: Demonstration of positive self-esteem  Outcome: Goal Ongoing  Goal: Knowledge of low self-esteem  Outcome: Goal Ongoing     Problem: Thought Process - Altered  Goal: Demonstration of organized thought processes  Outcome: Goal Ongoing  Goal: Knowledge of altered thought process  Outcome: Goal Ongoing     Problem: Violence, self/other-directed, Risk of  Goal: Absence of violence  Outcome: Goal Ongoing  Goal: Knowledge of risk for violence, self/other-directed  Outcome: Goal Ongoing     Problem: Transition Readiness  Goal: Knowledge of transition readiness  Outcome: Goal Ongoing     Problem: Cognitive-Perceptual Pattern - Impaired  Goal: Demonstration of organized thought processes  Outcome: Goal Ongoing  Goal: Knowledge of prescribed medication  Outcome: Goal Ongoing     Problem: Coping - Ineffective, Family  Goal: Effective Coping  Outcome: Goal Ongoing  Goal: Communicate with support staff  Outcome: Goal Ongoing  Goal: Knowledge of positive coping methods  Outcome: Goal Ongoing     Problem: Social Interaction - Impaired  Goal: Increased Social Interaction  Outcome: Goal Ongoing  Goal: Knowledge of social interaction  Outcome: Goal Ongoing

## 2021-03-20 NOTE — Progress Notes
Assume pt care at 1500.    Pt given LAI Invega in R arm and nicotine gum    Pt ate dinner and hs snack. Pt denies all assessment questions, no anxiety, depression, SI/HI, AVH, or pain. Pt watching movie with others after snack. Will continue to monitor for safety, Q15 minute checks in place.

## 2021-03-20 NOTE — Progress Notes
Chart check completed.

## 2021-03-21 MED ORDER — DIVALPROEX 500 MG PO TB24
2500 mg | Freq: Every evening | ORAL | 0 refills | Status: DC
Start: 2021-03-21 — End: 2021-06-11
  Administered 2021-03-22 – 2021-06-11 (×83): 2500 mg via ORAL

## 2021-03-21 NOTE — Progress Notes
Chart check completed.

## 2021-03-21 NOTE — Care Plan
Problem: Health Maintenance - Impaired  Goal: Able to perfom ADL's  Outcome: Goal Ongoing  Goal: Adequate nutritional intake  Outcome: Goal Ongoing  Goal: Establish therapeutic relationships  Outcome: Goal Ongoing  Goal: Knowledge of health maintenance  Outcome: Goal Ongoing     Problem: Mood - Altered  Goal: Stabilize mood  Outcome: Goal Ongoing  Goal: Knowledge of Altered Mood  Outcome: Goal Ongoing     Problem: Self-esteem - Low  Goal: Demonstration of positive self-esteem  Outcome: Goal Ongoing  Goal: Knowledge of low self-esteem  Outcome: Goal Ongoing     Problem: Thought Process - Altered  Goal: Demonstration of organized thought processes  Outcome: Goal Ongoing  Goal: Knowledge of altered thought process  Outcome: Goal Ongoing     Problem: Violence, self/other-directed, Risk of  Goal: Absence of violence  Outcome: Goal Ongoing  Goal: Knowledge of risk for violence, self/other-directed  Outcome: Goal Ongoing     Problem: Transition Readiness  Goal: Knowledge of transition readiness  Outcome: Goal Ongoing     Problem: Cognitive-Perceptual Pattern - Impaired  Goal: Demonstration of organized thought processes  Outcome: Goal Ongoing  Goal: Knowledge of prescribed medication  Outcome: Goal Ongoing     Problem: Coping - Ineffective, Family  Goal: Effective Coping  Outcome: Goal Ongoing  Goal: Communicate with support staff  Outcome: Goal Ongoing  Goal: Knowledge of positive coping methods  Outcome: Goal Ongoing     Problem: Social Interaction - Impaired  Goal: Increased Social Interaction  Outcome: Goal Ongoing  Goal: Knowledge of social interaction  Outcome: Goal Ongoing

## 2021-03-21 NOTE — Progress Notes
PSYCHIATRIC PROGRESS NOTE     LOS: 0 days  Voluntary/Involuntary Status: Voluntary  Guardian: Parents ([name] [place], Kent Mcginley)     MEDICATIONS     Current Psychotropic Medications:   1. Invega Sustenna IM every 28 days  2. Risperdal 3 mg QHS   3. Depakote ER 2000 mg QHS     ASSESSMENT & DIAGNOSES   Jamie [name] is a 25 y.o. Caucasian adult with a history of schizoaffective disorder and polysubstance abuse who presented to [facility] as a transfer from OSH with manic symptoms in context of methamphetamine use. This appears to be precipitated by methamphetamine use. Factors that seem to have predisposed him to mania include schizoaffective disorder, substance abuse and uncle with schizophrenia. This current problem is maintained by ongoing substance abuse, lack of consistent medication compliance, and poor insight. However, protective factors include good support from parents who are guardians, motivation to seek treatment, and fair insight into current situation. Proposed treatment will consist of substance detoxification and medication management.    DSM-5 DIAGNOSES:  1. Stimulant use disorder, methamphetamine type, moderate  2. Methamphetamine withdrawal  3. Schizoaffective disorder, bipolar type  4. Tobacco use disorder (chewing tobacco), severe  5. Stimulant use disorder, cocaine type, unspecified severity     PLAN       ? Received Invega Sustenna 156 mg IM 5/20. Next dose in one week.  ? Continue Risperidone 3 mg QHS for schizoaffective disorder and mood stabilization. Would consider decreasing pending second LAI loading dose.   ? Increase Depakote ER to PTA dose of 2500 mg QHS for schizoaffective disorder and mood stabilization  ? Would recommend obtaining TROUGH level on 5/25 prior to qHS dose.   ? Will perform AIMS this admission.  ? Discuss medication changes and disposition with mother (legal guardian). She agrees with plan to start long-acting injectable. Mother was informed that the patient is asking about discharge. She would like for the patient to be hospitalized until stable. Thus, the patient cannot leave against medical advice at this time.       Metabolic labs completed on:  Lipid Panel:   LDL   Date Value Ref Range Status   03/19/2021 106 (H) <100 mg/dL Final     HDL   Date Value Ref Range Status   03/19/2021 55 >40 MG/DL Final     VLDL   Date Value Ref Range Status   03/19/2021 15 MG/DL Final     Cholesterol   Date Value Ref Range Status   03/19/2021 174 <200 MG/DL Final     Triglycerides   Date Value Ref Range Status   03/19/2021 76 <150 MG/DL Final     Blood Glucose:  Hemoglobin A1C   Date Value Ref Range Status   03/19/2021 5.3 4.0 - 6.0 % Final     Comment:     The ADA recommends that most patients with type 1 and type 2 diabetes maintain   an A1c level <7%.       Disposition: Patient has legal guardian. He is unable to leave AMA unless approved by guardian. Pending evaluation for placement.  ?  Seen and discussed with Dr. Vernon Prey.  ______________________________________________________________     SUBJECTIVE     Groups: No. No agitation PRNs administered in past 24 hours.     Patient reports mood as tired. Denies SI/HI/AH. Endorses persistent VH of blue and red lights. Reports good sleep and appetite.     When later seen with staff patient displays  increased rate in speech. Reports that he does not feel like he is addicted to methamphetamines and feels like he could stop as long as he blocked the numbers of people that were providing him with drugs. Reports that he does not want to go to level II due to feeling that it would be unproductive for him not to work and make money for several months.     States that his parents are lying to him and telling him that the doctors recommended level II. Discussed with patient that as a team his family and team believe that pursuing level II placement would be in his best interest at this time.      REVIEW OF SYSTEMS   Review of Systems Constitutional: Negative for activity change.   HENT: Negative for sore throat.    Eyes: Negative for pain.   Respiratory: Negative for shortness of breath.    Cardiovascular: Negative for chest pain.   Gastrointestinal: Negative for constipation and diarrhea.   Genitourinary: Negative for dysuria.   Musculoskeletal: Negative for myalgias.   Neurological: Negative for dizziness and headaches.   Psychiatric/Behavioral: Negative for agitation.      OBJECTIVE                    Vital Signs:  Current                Vital Signs: 24 Hour Range   BP: 129/73 (05/22 0727)  Temp: 36.8 ?C (98.2 ?F) (05/22 1610)  Pulse: 68 (05/22 0727)  Respirations: 18 PER MINUTE (05/22 0727)  SpO2: 97 % (05/22 0727) BP: (129-134)/(73-83)   Temp:  [36.8 ?C (98.2 ?F)-37.3 ?C (99.2 ?F)]   Pulse:  [68-110]   Respirations:  [16 PER MINUTE-18 PER MINUTE]   SpO2:  [96 %-97 %]    Intensity Pain Scale (Self Report): (not recorded)      Sleep:  Hours of Sleep: 7.25   Quality of Sleep: Resting Quietly    Scheduled Medications:  divalproex (DEPAKOTE ER) ER tablet 2,000 mg, 2,000 mg, Oral, QHS  nicotine (NICODERM CQ STEP 1) 21 mg/day patch 1 patch, 1 patch, Transdermal, QDAY  risperiDONE (RisperDAL) tablet 3 mg, 3 mg, Oral, QHS    PRN Medications:  acetaminophen Q6H PRN, calcium carbonate TID PRN, nicotine polacrilex Q2H PRN 4 mg at 03/20/21 2027, OLANZapine Q6H PRN **OR** OLANZapine Q6H PRN, polyethylene glycol 3350 QDAY PRN, traZODone QHS PRN 50 mg at 03/20/21 2109    Mental Status Exam:  ? General/Constitutional: 25 y.o. adult, appears stated age, dressed  ? in hospital clothes, appropriate grooming  ? Behavior: calm, cooperative, mostly pleasant  ? Speech/Motor: increased rate (bordering on pressured) regular volume and rhythm  ? Eye Contact: fair  ? Mood: pretty good  ? Affect: euthymic, congruent with stated mood  ? Thought Process: linear and goal directed  ? Associations: Intact  ? Thought Content: Denies SI/HI. Shows no evidence of delusions or paranoia   ? Perception: denies AH/ endorses VH. Does not appear to be responding to internal stimuli   ? Insight/Judgment: fair/poor    ? Orientation: Alert and awake  ? Recent and Remote Memory: grossly intact  ? Attention span and concentration: poor  ? Language: fluent  ? Fund of knowledge and vocabulary: average    Focused Physical Exam:  ? Eyes: EOMI, no scleral icterus noted  ? Extremities/Musculoskeletal:  Moves all extremities through full active ROM  ? Gait ambulates independently without difficulties  ______________________________________________________________  Maura Crandall, DO

## 2021-03-21 NOTE — Care Plan
Problem: Health Maintenance - Impaired  Goal: Establish therapeutic relationships  Outcome: Goal Ongoing     Problem: Mood - Altered  Goal: Stabilize mood  Outcome: Goal Ongoing  Goal: Knowledge of Altered Mood  Outcome: Goal Ongoing     Problem: Thought Process - Altered  Goal: Demonstration of organized thought processes  Outcome: Goal Ongoing  Goal: Knowledge of altered thought process  Outcome: Goal Ongoing     Problem: Coping - Ineffective, Family  Goal: Effective Coping  Outcome: Goal Ongoing  Goal: Knowledge of positive coping methods  Outcome: Goal Ongoing

## 2021-03-21 NOTE — Progress Notes
PSYCHIATRIC NURSING ASSESSMENT     NAME:Jamie [name]             MRN: [id number]             DOB:1996-08-04          AGE: 25 y.o.  ADMISSION DATE: 03/16/2021             DAYS ADMITTED: LOS: 0 days    Mental Status Exam  Legal Status: Voluntary Admission (per guardian)  General Appearance: Normal  Mood / Affect: Elevated mood  Speech: Normal  Content Of Thought: Normal  Motor Activity: Normal  Flow of Thought: Normal  Insight / Judgment: Poor judgment, Fair insight  Behavior: Calm, Follows commands, Cooperative  Patient Strengths: Exercising self-direction, Vocational interests, i.e. hobbies, Setting and pursuing goals, Knowledge of medications    Derel denies depression, anxiety, SI/HI, AVH, and pain. He reports his mood is "doing alright." Pt states that he is planning on discharging tomorrow. He continues to report some drowsiness that he relates to the recent methamphetamine use. He naps intermittently during the day. Pt states his appetite is "great." He says that he is going to ensure that he remembers to get his LAI on time once discharged. No behavioral issues today. Appropriate interactions with peers and staff.    Encouraged him to come to staff with concerns. Pt is on 15 minute checks to monitor for safety and behaviors.     Renee Ramus, RN  03/21/2021
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
## 2021-03-22 NOTE — Behavioral Health Treatment Team
TREATMENT TEAM NOTE  Name: Jamie [name]        MRN: [id number]          DOB: [birth year]            Age: 25 y.o.  Admission Date: 03/16/2021       LOS: 0 days    Date of Service: 03/22/2021        Attendees:  UR: Ival Bible, LMSW  Nursing: Romilda Joy, BSN, RN  Therapy: Clement Husbands, LSCSW, [facility]  CM: Henderson Cloud, LMSW  CM: Neysa Bonito, [facility]  Rec Therapy: Michaell Cowing, CTRS  Nurse Manager: Ursula Beath, RN  Pharmacy: Royetta Crochet, PharmD  Psychology: Pincus Large  Psychology: Peter Minium, PhD  Psychiatry: Frankey Poot, APRN-NP  Psychiatry Resident: Orest Dikes, DO  Psychiatry Resident: Maura Crandall, DO      Significant Points Discussed: engaged in groups, upset parents feel he needs a level 2 placement, adjusted meds, waiting and working on placement     Treatment Plan Discussion: continue treatment

## 2021-03-22 NOTE — Progress Notes
Psychology [phone])    Service(s): Individual Counseling and Psychotherapy    Presentation: Met with Jamie [name] to provide individual support and engage him in discussion of substance abuse using MI strategies. He described his mood as really good and stated that he is ready to go back to work. He expressed concern about receiving a higher level of care as he feels his parents have given up on him. He feels he just needs people to check in on him and good opportunities with work. Being able to drive and have his agency is important to him. We discussed how using substances will ultimately limit his agency. We discussed his Bipolar I diagnosis, the various reasons for which he enjoys doing meth as well as ways in which using meth can hold him back, and discussed how the interaction between his drug use and mania can affect him both short-term and long-term. He was receptive and engaged in serious discussion. He was able to articulate reasons for and against using methamphetamine in a logical manner. He was witty and quick to use humor, as he typically is. We joked about him losing his witty sense of humor if he keeps using drugs and not needing his teeth as his father is a Education officer, community. Discussed the effects of sustained substance use on the brain over time. Jamie [name] was engaged and pleasant in conversation.     MSE: Casually dressed and groomed, A+Ox3, mood really good? fair range of affect, euthymic, appropriate humor, eye contact was WNL, speech WNL, TP linear and goal-directed, TC was negative for SI, HI, AVH, judgment was fair. insight was fair to good, actively engaged in session.    Psychotherapy: Individual counseling was provided. Focus of session centered on psychoeducation and MI surrounding the interaction between substance abuse and Bipolar I Disorder. Used interpersonal therapy to engage patient and challenge his thinking about drug use and it's effects on his behavior, especially in the context of mood episodes. Pt expressed gratitude for the session.     Plan: Will continue to follow    Evette Doffing, Ph.D.   Psychology Postdoctoral Fellow  Pager [phone]  On [facility]

## 2021-03-22 NOTE — Care Plan
Pt meeting with treatment team daily.  Discharge planning ongoing.   Mood and behavior have been stable. Patient denies pain, SI/HI/AVH.  Pt out in the milieu, watched tv, and interacting with peers.   Pt reported nose bleed, the second one of the day.  Pt given fresh linen and clothes.  Pt compliant with HS medication.      Problem: Health Maintenance - Impaired  Goal: Able to perfom ADL's  Outcome: Goal Ongoing  Goal: Adequate nutritional intake  Outcome: Goal Ongoing  Goal: Establish therapeutic relationships  Outcome: Goal Ongoing  Goal: Knowledge of health maintenance  Outcome: Goal Ongoing     Problem: Mood - Altered  Goal: Stabilize mood  Outcome: Goal Ongoing  Goal: Knowledge of Altered Mood  Outcome: Goal Ongoing     Problem: Self-esteem - Low  Goal: Demonstration of positive self-esteem  Outcome: Goal Ongoing  Goal: Knowledge of low self-esteem  Outcome: Goal Ongoing     Problem: Thought Process - Altered  Goal: Demonstration of organized thought processes  Outcome: Goal Ongoing  Goal: Knowledge of altered thought process  Outcome: Goal Ongoing     Problem: Violence, self/other-directed, Risk of  Goal: Absence of violence  Outcome: Goal Ongoing  Goal: Knowledge of risk for violence, self/other-directed  Outcome: Goal Ongoing     Problem: Transition Readiness  Goal: Knowledge of transition readiness  Outcome: Goal Ongoing     Problem: Cognitive-Perceptual Pattern - Impaired  Goal: Demonstration of organized thought processes  Outcome: Goal Ongoing  Goal: Knowledge of prescribed medication  Outcome: Goal Ongoing     Problem: Coping - Ineffective, Family  Goal: Effective Coping  Outcome: Goal Ongoing  Goal: Communicate with support staff  Outcome: Goal Ongoing  Goal: Knowledge of positive coping methods  Outcome: Goal Ongoing     Problem: Social Interaction - Impaired  Goal: Increased Social Interaction  Outcome: Goal Ongoing  Goal: Knowledge of social interaction  Outcome: Goal Ongoing

## 2021-03-22 NOTE — Care Plan
Problem: Health Maintenance - Impaired  Goal: Able to perfom ADL's  Outcome: Goal Ongoing  Goal: Adequate nutritional intake  Outcome: Goal Ongoing  Goal: Establish therapeutic relationships  Outcome: Goal Ongoing  Goal: Knowledge of health maintenance  Outcome: Goal Ongoing     Problem: Mood - Altered  Goal: Stabilize mood  Outcome: Goal Ongoing  Goal: Knowledge of Altered Mood  Outcome: Goal Ongoing     Problem: Self-esteem - Low  Goal: Demonstration of positive self-esteem  Outcome: Goal Ongoing  Goal: Knowledge of low self-esteem  Outcome: Goal Ongoing     Problem: Thought Process - Altered  Goal: Demonstration of organized thought processes  Outcome: Goal Ongoing  Goal: Knowledge of altered thought process  Outcome: Goal Ongoing     Problem: Violence, self/other-directed, Risk of  Goal: Absence of violence  Outcome: Goal Ongoing  Goal: Knowledge of risk for violence, self/other-directed  Outcome: Goal Ongoing     Problem: Transition Readiness  Goal: Knowledge of transition readiness  Outcome: Goal Ongoing     Problem: Cognitive-Perceptual Pattern - Impaired  Goal: Demonstration of organized thought processes  Outcome: Goal Ongoing  Goal: Knowledge of prescribed medication  Outcome: Goal Ongoing     Problem: Coping - Ineffective, Family  Goal: Effective Coping  Outcome: Goal Ongoing  Goal: Communicate with support staff  Outcome: Goal Ongoing  Goal: Knowledge of positive coping methods  Outcome: Goal Ongoing     Problem: Social Interaction - Impaired  Goal: Increased Social Interaction  Outcome: Goal Ongoing  Goal: Knowledge of social interaction  Outcome: Goal Ongoing

## 2021-03-22 NOTE — Progress Notes
Chart check completed.

## 2021-03-22 NOTE — Progress Notes
PSYCHIATRIC PROGRESS NOTE     LOS: 0 days  Voluntary/Involuntary Status: Voluntary  Guardian: Parents ([name] [place], Kent Renaldo)     MEDICATIONS     Current Psychotropic Medications:   1. Invega Sustenna IM every 28 days  2. Risperdal 3 mg QHS   3. Depakote ER 2000 mg QHS     ASSESSMENT & DIAGNOSES   Jamie [name] is a 25 y.o. Caucasian adult with a history of schizoaffective disorder and polysubstance abuse who presented to [facility] as a transfer from OSH with manic symptoms in context of methamphetamine use. This appears to be precipitated by methamphetamine use. Factors that seem to have predisposed him to mania include schizoaffective disorder, substance abuse and uncle with schizophrenia. This current problem is maintained by ongoing substance abuse, lack of consistent medication compliance, and poor insight. However, protective factors include good support from parents who are guardians, motivation to seek treatment, and fair insight into current situation. Proposed treatment will consist of substance detoxification and medication management.    DSM-5 DIAGNOSES:  1. Stimulant use disorder, methamphetamine type, moderate  2. Methamphetamine withdrawal  3. Schizoaffective disorder, bipolar type  4. Tobacco use disorder (chewing tobacco), severe  5. Stimulant use disorder, cocaine type, unspecified severity     PLAN     ? Received Invega Sustenna 156 mg IM 5/20. Will receive second injection this week.  ? Continue Risperidone 3 mg QHS for schizoaffective disorder and mood stabilization. Would consider decreasing pending second LAI loading dose.   ? Continue Depakote ER PTA 2500 mg QHS for schizoaffective disorder and mood stabilization  ? Obtain TROUGH level on 5/25  ? Will perform AIMS this admission.  ? Discuss medication changes and disposition with mother (legal guardian). She agrees with plan to start long-acting injectable. Mother was informed that the patient is asking about discharge. She would like for the patient to be hospitalized until stable. Thus, the patient cannot leave against medical advice at this time.       Metabolic labs completed on:  Lipid Panel:   LDL   Date Value Ref Range Status   03/19/2021 106 (H) <100 mg/dL Final     HDL   Date Value Ref Range Status   03/19/2021 55 >40 MG/DL Final     VLDL   Date Value Ref Range Status   03/19/2021 15 MG/DL Final     Cholesterol   Date Value Ref Range Status   03/19/2021 174 <200 MG/DL Final     Triglycerides   Date Value Ref Range Status   03/19/2021 76 <150 MG/DL Final     Blood Glucose:  Hemoglobin A1C   Date Value Ref Range Status   03/19/2021 5.3 4.0 - 6.0 % Final     Comment:     The ADA recommends that most patients with type 1 and type 2 diabetes maintain   an A1c level <7%.       Disposition: Patient has legal guardian. He is unable to leave AMA unless approved by guardian. Pending evaluation for placement.  ?  Seen and discussed with Dr. Izell Carolina.  ______________________________________________________________     SUBJECTIVE     [name] is found laying in his hospital bed while listening to music this morning. He states I'm feeling well and ready to get the hell out of here.. When asked about any changes that he has noticed, patient reports I have come to the realization that I shouldn't do drugs. He continues to disagree with  plan for placement to facility at discharge. Discussed with the patient again that he has legal guardians that are in charge of his medical decisions. He understands this but continues to have negative outlook towards placement. He states that his sleep has been fine. Nursing report indicates that he slept for 7.75 hours last night. His appetite is described as good as it can be and has been consuming all of his meals without difficulty. He denies any suicidal/homicidal ideations. His visual hallucinations are at baseline and he denies any auditory hallucinations. Patient continues to be grandiose. When patient was asked to attend group therapy, he responded I can write out the lesson plans for these groups and feels that he has nothing to learn from them. He has not been actively engaging in group therapy sessions over the weekend. Additionally, he is hyperverbal with pressured speech. He speaks over the provider throughout the interview and is difficult to interrupt.      REVIEW OF SYSTEMS   Review of Systems   Constitutional: Negative for activity change.   HENT: Negative for sore throat.    Eyes: Negative for pain.   Respiratory: Negative for shortness of breath.    Cardiovascular: Negative for chest pain.   Gastrointestinal: Negative for constipation and diarrhea.   Genitourinary: Negative for dysuria.   Musculoskeletal: Negative for myalgias.   Neurological: Negative for dizziness and headaches.   Psychiatric/Behavioral: Negative for agitation.      OBJECTIVE                    Vital Signs:  Current                Vital Signs: 24 Hour Range   BP: 149/72 (05/23 0800)  Temp: 36.7 ?C (98 ?F) (05/23 0800)  Pulse: 59 (05/23 0800)  Respirations: 18 PER MINUTE (05/23 0800)  SpO2: 97 % (05/23 0800) BP: (144-149)/(72-82)   Temp:  [36.6 ?C (97.8 ?F)-36.7 ?C (98 ?F)]   Pulse:  [59-102]   Respirations:  [18 PER MINUTE]   SpO2:  [96 %-97 %]    Intensity Pain Scale (Self Report): (not recorded)      Sleep:  Hours of Sleep: 7.75   Quality of Sleep: Resting Quietly    Scheduled Medications:  divalproex (DEPAKOTE ER) ER tablet 2,500 mg, 2,500 mg, Oral, QHS  nicotine (NICODERM CQ STEP 1) 21 mg/day patch 1 patch, 1 patch, Transdermal, QDAY  risperiDONE (RisperDAL) tablet 3 mg, 3 mg, Oral, QHS    PRN Medications:  acetaminophen Q6H PRN, calcium carbonate TID PRN, nicotine polacrilex Q2H PRN 4 mg at 03/21/21 1756, OLANZapine Q6H PRN **OR** OLANZapine Q6H PRN, polyethylene glycol 3350 QDAY PRN, traZODone QHS PRN 50 mg at 03/21/21 2048    Mental Status Exam:  ? General/Constitutional: 25 y.o. adult, appears stated age, dressed  ? in hospital clothes, appropriate grooming  ? Behavior: calm, cooperative, mostly pleasant  ? Speech/Motor: hyperverbal, pressured speech, difficult to interrupt  ? Eye Contact: fair  ? Mood: pretty good  ? Affect: euthymic, congruent with stated mood  ? Thought Process: linear and goal directed  ? Associations: Intact  ? Thought Content: Denies SI/HI. grandiose  ? Perception: denies AH/ endorses VH. Does not appear to be responding to internal stimuli   ? Insight/Judgment: fair/poor  ? Orientation: Alert and awake  ? Recent and Remote Memory: grossly intact  ? Attention span and concentration: poor  ? Language: fluent  ? Fund of knowledge and vocabulary: average  Focused Physical Exam:  ? Eyes: EOMI, no scleral icterus noted  ? Extremities/Musculoskeletal:  Moves all extremities through full active ROM  ? Gait ambulates independently without difficulties    ______________________________________________________________  [name], MS3    A medical student assisted me in the completion of this note. I performed my own evaluation of this patient, and agree with the student's above assessment.?  Orest Dikes, DO

## 2021-03-22 NOTE — Care Coordination-Inpatient
Care Coordination - Inpatient Note      Patient Name:   Jamie [name]                        MRN:  [id number]  Admission Date:  03/16/2021    12:47 pm  CM called [facility] on Aging to initiate Level 1 CARES assessment. CM left message with contact info requesting a call back.     [name]  03/22/2021

## 2021-03-22 NOTE — Progress Notes
PSYCHIATRIC NURSING ASSESSMENT     NAME:Jamie [name]             MRN: [id number]             DOB:September 06, 1996          AGE: 25 y.o.  ADMISSION DATE: 03/16/2021             DAYS ADMITTED: LOS: 0 days    Mental Status Exam  Legal Status: Voluntary Admission  General Appearance: Normal  Mood / Affect: Elevated mood  Speech: Normal  Content Of Thought: Normal  Motor Activity: Normal  Flow of Thought: Linear, Goal directed  Insight / Judgment: Fair insight, Fair judgment  Behavior: Cooperative, Follows commands, Normal  Patient Strengths: Assessment of patient optimism that change can occur, Exercising self-direction, Knowledge of medications, Managing surrounding demands and opportunities, Motivation and readiness for change, Setting and pursuing goals    Resean denies depression, anxiety, SI/HI, AVH, and pain. Pt still spends a lot of the day resting in his room. When on the milieu, he is loud and talkative with peers and staff. His mood is "really great." Pt continues to report that he does not agree with going to a level II facility upon discharge. Appropriate appetite and sleep per pt report. PRN nicotine gum administered per orders for cravings. Declines side effects related to medication. Declines experiencing cravings. Not observed responding to internal stimuli.     He has triggered some peers on the unit due to discussing meth and alcohol use. He does well with verbal redirection from staff.    Encouraged him to come to staff with concerns. Pt is on 15 minute checks to monitor for safety and behaviors.     Renee Ramus, RN  03/22/2021

## 2021-03-23 MED ORDER — PALIPERIDONE PALMITATE 156 MG/ML IM SYRG
156 mg | Freq: Once | INTRAMUSCULAR | 0 refills | Status: CP
Start: 2021-03-23 — End: ?
  Administered 2021-03-23: 21:00:00 156 mg via INTRAMUSCULAR

## 2021-03-23 NOTE — Behavioral Health Treatment Team
TREATMENT TEAM NOTE  Name: Biran Mayberry        MRN: [id number]          DOB: 1996/02/17            Age: 25 y.o.  Admission Date: 03/16/2021       LOS: 0 days    Date of Service: 03/23/2021        Attendees:  Nursing: Freddie Apley, RN, BSN  Therapy: Clement Husbands, LSCSW, [facility]  CM: Henderson Cloud, LMSW  CM: Neysa Bonito, LPC  Rec Therapy: Clare Charon, CTRS  Music Therapy: Berle Mull, MTBC, [facility]  Clinical Manager: Lacy Duverney, LSCSW  Nurse Manager: Ursula Beath, RN  Pharmacy: Royetta Crochet, PharmD  Psychology: Assunta Gambles. Lowry Bowl, PhD  Psychology: Peter Minium, PhD  Psychiatry: Haroldine Laws, DO   Psychiatry: Frankey Poot, APRN-NP  Psychiatry Resident: Carney Corners, DO  Psychiatry Resident: Maura Crandall, DO       Significant Points Discussed:   Can be loud on unit  Moderately engaged in groups  Looking into placement     Treatment Plan Discussion:   Possible D/C Thursday

## 2021-03-23 NOTE — Progress Notes
Chart check completed.

## 2021-03-23 NOTE — Care Coordination-Inpatient
Care Coordination - Inpatient Note      Patient Name:   Jamie [name]                        MRN:  [id number]  Admission Date:  03/16/2021    1:46 pm - 1:48 pm  CM called the [employer] on Aging [phone]) to schedule Pt's Level 1 CARES assessment. CM was transferred to [employer], CM left message requesting a call back.     Tytiana Coles [employer]  03/23/2021

## 2021-03-23 NOTE — Progress Notes
Chart check completed

## 2021-03-23 NOTE — Care Plan
Pt meeting with treatment team daily.  Discharge planning ongoing.   Mood and behavior have been stable. Patient denies pain, SI/HI/AVH.  Pt out in the milieu, watched tv, and interacting with peers.  Pt compliant with HS medication.        Problem: Health Maintenance - Impaired  Goal: Able to perfom ADL's  Outcome: Goal Ongoing  Goal: Adequate nutritional intake  Outcome: Goal Ongoing  Goal: Establish therapeutic relationships  Outcome: Goal Ongoing  Goal: Knowledge of health maintenance  Outcome: Goal Ongoing     Problem: Mood - Altered  Goal: Stabilize mood  Outcome: Goal Ongoing  Goal: Knowledge of Altered Mood  Outcome: Goal Ongoing     Problem: Self-esteem - Low  Goal: Demonstration of positive self-esteem  Outcome: Goal Ongoing  Goal: Knowledge of low self-esteem  Outcome: Goal Ongoing     Problem: Thought Process - Altered  Goal: Demonstration of organized thought processes  Outcome: Goal Ongoing  Goal: Knowledge of altered thought process  Outcome: Goal Ongoing     Problem: Violence, self/other-directed, Risk of  Goal: Absence of violence  Outcome: Goal Ongoing  Goal: Knowledge of risk for violence, self/other-directed  Outcome: Goal Ongoing     Problem: Transition Readiness  Goal: Knowledge of transition readiness  Outcome: Goal Ongoing     Problem: Cognitive-Perceptual Pattern - Impaired  Goal: Demonstration of organized thought processes  Outcome: Goal Ongoing  Goal: Knowledge of prescribed medication  Outcome: Goal Ongoing     Problem: Coping - Ineffective, Family  Goal: Effective Coping  Outcome: Goal Ongoing  Goal: Communicate with support staff  Outcome: Goal Ongoing  Goal: Knowledge of positive coping methods  Outcome: Goal Ongoing     Problem: Social Interaction - Impaired  Goal: Increased Social Interaction  Outcome: Goal Ongoing  Goal: Knowledge of social interaction  Outcome: Goal Ongoing

## 2021-03-23 NOTE — Progress Notes
PSYCHIATRIC PROGRESS NOTE     LOS: 0 days  Voluntary/Involuntary Status: Voluntary  Guardian: Parents ([name] [place], Kent Lukins)     MEDICATIONS     Current Psychotropic Medications:   1. [name] IM every 28 days (last injection 03/23/2021)  2. Depakote ER 2500 mg QHS     ASSESSMENT & DIAGNOSES     Jamie [name] is a 25 y.o. Caucasian adult with a history of schizoaffective disorder and polysubstance abuse who presented to [facility] as a transfer from OSH with manic symptoms in context of methamphetamine use. This appears to be precipitated by methamphetamine use. Factors that seem to have predisposed him to mania include schizoaffective disorder, substance abuse and uncle with schizophrenia. This current problem is maintained by ongoing substance abuse, lack of consistent medication compliance, and poor insight. However, protective factors include good support from parents who are guardians, motivation to seek treatment, and fair insight into current situation. Proposed treatment will consist of substance detoxification and medication management.    DSM-5 DIAGNOSES:  1. Stimulant use disorder, methamphetamine type, moderate  2. Methamphetamine withdrawal  3. Schizoaffective disorder, bipolar type  4. Tobacco use disorder (chewing tobacco), severe  5. Stimulant use disorder, cocaine type, unspecified severity     PLAN     ? Received [name] 156 mg IM second dose of initiation on 03/23/2021  ? Discontinue Risperidone 3 mg QHS   ? Continue Depakote ER to PTA dose of 2500 mg QHS for schizoaffective disorder and mood stabilization  ? Obtaining TROUGH level on 5/25 prior to qHS dose.   ? Will perform AIMS this admission.  ? Pending level 1 CARE assessment  ? Discuss medication changes and disposition with mother (legal guardian). She agrees with plan to start long-acting injectable. Mother was informed that the patient is asking about discharge. She would like for the patient to be hospitalized until stable. Thus, the patient cannot leave against medical advice at this time.       Metabolic labs completed on:  Lipid Panel:   LDL   Date Value Ref Range Status   03/19/2021 106 (H) <100 mg/dL Final     HDL   Date Value Ref Range Status   03/19/2021 55 >40 MG/DL Final     VLDL   Date Value Ref Range Status   03/19/2021 15 MG/DL Final     Cholesterol   Date Value Ref Range Status   03/19/2021 174 <200 MG/DL Final     Triglycerides   Date Value Ref Range Status   03/19/2021 76 <150 MG/DL Final     Blood Glucose:  Hemoglobin A1C   Date Value Ref Range Status   03/19/2021 5.3 4.0 - 6.0 % Final     Comment:     The ADA recommends that most patients with type 1 and type 2 diabetes maintain   an A1c level <7%.       Disposition: Patient has legal guardian. He is unable to leave AMA unless approved by guardian. Pending evaluation for placement.  ?  Seen and discussed with Dr. Izell Carolina.  ______________________________________________________________     SUBJECTIVE     Jamie [name] is found participating in group art therapy this afternoon. He reports that he is feeling frustrated and agitated in relation to his placement situation. He does not agree with his parents (legal guardians) and does not feel that he needs placement. Discussed with the placement that he has displayed psychiatric instability in the past with numerous  inpatient hospitalizations. Additionally, he has been involved in multiple legal charges surrounding DUI and suspended license. He continues to drive without license. He has issues with his current housing arrangement. He has been allowing others to stay with him and his landlord has threatened to end his lease agreement due to violation of the rules. Jamie [name] has little insight and feels that his placement is related to recent drug use despite pursuit of placement during his last admission. He denies any current suicidal ideation or homicidal ideation. He continues to have visual hallucinations at baseline. He denies any auditory hallucinations at this time. He has been sleeping well and nursing report indicates that he slept for seven hours last night. He has been eating meals without difficulty. He has not required any PRN agitation medications in the last 24 hours.      REVIEW OF SYSTEMS   Review of Systems   Constitutional: Negative for activity change.   HENT: Negative for sore throat.    Eyes: Negative for pain.   Respiratory: Negative for shortness of breath.    Cardiovascular: Negative for chest pain.   Gastrointestinal: Negative for constipation and diarrhea.   Genitourinary: Negative for dysuria.   Musculoskeletal: Negative for myalgias.   Neurological: Negative for dizziness and headaches.   Psychiatric/Behavioral: Negative for agitation.      OBJECTIVE                    Vital Signs:  Current                Vital Signs: 24 Hour Range   BP: 126/72 (05/24 0700)  Temp: 36.5 ?C (97.7 ?F) (05/24 0700)  Pulse: 92 (05/24 0700)  Respirations: 18 PER MINUTE (05/24 0700)  SpO2: 99 % (05/24 0700) BP: (124-126)/(69-72)   Temp:  [36.5 ?C (97.7 ?F)-37 ?C (98.6 ?F)]   Pulse:  [92-105]   Respirations:  [16 PER MINUTE-18 PER MINUTE]   SpO2:  [97 %-99 %]    Intensity Pain Scale (Self Report): (not recorded)      Sleep:  Hours of Sleep: 7.0   Quality of Sleep: Resting Quietly    Scheduled Medications:  divalproex (DEPAKOTE ER) ER tablet 2,500 mg, 2,500 mg, Oral, QHS  nicotine (NICODERM CQ STEP 1) 21 mg/day patch 1 patch, 1 patch, Transdermal, QDAY  risperiDONE (RisperDAL) tablet 3 mg, 3 mg, Oral, QHS    PRN Medications:  acetaminophen Q6H PRN, calcium carbonate TID PRN, nicotine polacrilex Q2H PRN 4 mg at 03/22/21 2059, OLANZapine Q6H PRN **OR** OLANZapine Q6H PRN, polyethylene glycol 3350 QDAY PRN, traZODone QHS PRN 50 mg at 03/22/21 2059    Mental Status Exam:  ? General/Constitutional: 25 y.o. adult, appears stated age, dressed  ? in hospital clothes, appropriate grooming  ? Behavior: calm, cooperative, mostly pleasant  ? Speech/Motor: increased rate (bordering on pressured) regular volume and rhythm  ? Eye Contact: fair  ? Mood: frustrated  ? Affect: euthymic, congruent with stated mood  ? Thought Process: linear and goal directed  ? Associations: Intact  ? Thought Content: Denies SI/HI. Shows no evidence of delusions or paranoia   ? Perception: denies AH/ endorses VH. Does not appear to be responding to internal stimuli   ? Insight/Judgment: fair/poor    ? Orientation: Alert and awake  ? Recent and Remote Memory: grossly intact  ? Attention span and concentration: poor  ? Language: fluent  ? Fund of knowledge and vocabulary: average    Focused Physical Exam:  ?  Eyes: EOMI, no scleral icterus noted  ? Extremities/Musculoskeletal:  Moves all extremities through full active ROM  ? Gait ambulates independently without difficulties    ______________________________________________________________  Jamie [name]     A medical student assisted me in the completion of this note. I performed my own evaluation of this patient, and agree with the student's above assessment.?  Orest Dikes, DO

## 2021-03-24 NOTE — Progress Notes
PSYCHIATRIC PROGRESS NOTE     LOS: 0 days  Voluntary/Involuntary Status: Voluntary  Guardian: Parents ([name] Selden, Kent Warren)     MEDICATIONS     Current Psychotropic Medications:   1. [name] IM every 28 days (last injection 03/23/2021)  2. Depakote ER 2500 mg QHS     ASSESSMENT & DIAGNOSES     Jamie [name] is a 25 y.o. Caucasian adult with a history of schizoaffective disorder and polysubstance abuse who presented to [facility] as a transfer from OSH with manic symptoms in context of methamphetamine use. This appears to be precipitated by methamphetamine use. Factors that seem to have predisposed him to mania include schizoaffective disorder, substance abuse and uncle with schizophrenia. This current problem is maintained by ongoing substance abuse, lack of consistent medication compliance, and poor insight. However, protective factors include good support from parents who are guardians, motivation to seek treatment, and fair insight into current situation. Proposed treatment will consist of substance detoxification and medication management.    DSM-5 DIAGNOSES:  1. Stimulant use disorder, methamphetamine type, moderate  2. Methamphetamine withdrawal  3. Schizoaffective disorder, bipolar type  4. Tobacco use disorder (chewing tobacco), severe  5. Stimulant use disorder, cocaine type, unspecified severity     PLAN     ? No indications for medication changes on 03/24/2021.  ? Received [name] 156 mg IM second dose of initiation on 03/23/2021  ? Continue Depakote ER to PTA dose of 2500 mg QHS for schizoaffective disorder and mood stabilization  ? Obtaining TROUGH level on 5/25 prior to qHS dose.   ? Will perform AIMS this admission.  ? Pending level 1 CARE assessment      Metabolic labs completed on:  Lipid Panel:   LDL   Date Value Ref Range Status   03/19/2021 106 (H) <100 mg/dL Final     HDL   Date Value Ref Range Status   03/19/2021 55 >40 MG/DL Final     VLDL   Date Value Ref Range Status 03/19/2021 15 MG/DL Final     Cholesterol   Date Value Ref Range Status   03/19/2021 174 <200 MG/DL Final     Triglycerides   Date Value Ref Range Status   03/19/2021 76 <150 MG/DL Final     Blood Glucose:  Hemoglobin A1C   Date Value Ref Range Status   03/19/2021 5.3 4.0 - 6.0 % Final     Comment:     The ADA recommends that most patients with type 1 and type 2 diabetes maintain   an A1c level <7%.       Disposition: Patient has legal guardian. He is unable to leave AMA unless approved by guardian. Pending evaluation for placement.  ?  Seen and discussed with Dr. Izell Carolina.  ______________________________________________________________     SUBJECTIVE     [name] is found laying in his hospital bed this morning. He reports that he is ready to get the hell out of here. Discussed with the patient regarding need for placement assessment. Case management continues to work towards getting evaluation. Patient continues to be resistant against placement. He is aware that his legal guardians will make his medical decisions. He denies any problems with sleeping or eating. Per nursing report, he slept for 6.25 hours last night. He denies any suicidal ideation, homicidal ideation, and auditory hallucinations. His visual hallucinations remain unchanged.     REVIEW OF SYSTEMS   Review of Systems   Constitutional: Negative for activity  change.   HENT: Negative for sore throat.    Eyes: Negative for pain.   Respiratory: Negative for shortness of breath.    Cardiovascular: Negative for chest pain.   Gastrointestinal: Negative for constipation and diarrhea.   Genitourinary: Negative for dysuria.   Musculoskeletal: Negative for myalgias.   Neurological: Negative for dizziness and headaches.   Psychiatric/Behavioral: Negative for agitation.      OBJECTIVE                    Vital Signs:  Current                Vital Signs: 24 Hour Range   BP: 122/63 (05/25 0728)  Temp: 36.9 ?C (98.4 ?F) (05/25 1610)  Pulse: 63 (05/25 0728)  Respirations: 19 PER MINUTE (05/25 0728)  SpO2: 97 % (05/25 0728) BP: (122-126)/(61-63)   Temp:  [36.9 ?C (98.4 ?F)-37 ?C (98.6 ?F)]   Pulse:  [63-101]   Respirations:  [17 PER MINUTE-19 PER MINUTE]   SpO2:  [97 %]    Intensity Pain Scale (Self Report): (not recorded)      Sleep:  Hours of Sleep: 6.25   Quality of Sleep: Resting Quietly    Scheduled Medications:  divalproex (DEPAKOTE ER) ER tablet 2,500 mg, 2,500 mg, Oral, QHS  nicotine (NICODERM CQ STEP 1) 21 mg/day patch 1 patch, 1 patch, Transdermal, QDAY    PRN Medications:  acetaminophen Q6H PRN, calcium carbonate TID PRN, nicotine polacrilex Q2H PRN 4 mg at 03/24/21 1040, OLANZapine Q6H PRN **OR** OLANZapine Q6H PRN, polyethylene glycol 3350 QDAY PRN, traZODone QHS PRN 50 mg at 03/23/21 2143    Mental Status Exam:  ? General/Constitutional: 25 y.o. adult, appears stated age, dressed  ? in hospital clothes, appropriate grooming  ? Behavior: calm, cooperative, mostly pleasant  ? Speech/Motor: increased rate (bordering on pressured) regular volume and rhythm  ? Eye Contact: fair  ? Mood: ready to get the hell out of here  ? Affect: irritable, congruent with stated mood  ? Thought Process: linear and goal directed  ? Associations: Intact  ? Thought Content: Denies SI/HI. Shows no evidence of delusions or paranoia   ? Perception: denies AH/ endorses VH. Does not appear to be responding to internal stimuli   ? Insight/Judgment: fair/poor    ? Orientation: Alert and awake  ? Recent and Remote Memory: grossly intact  ? Attention span and concentration: poor  ? Language: fluent  ? Fund of knowledge and vocabulary: average    Focused Physical Exam:  ? Eyes: EOMI, no scleral icterus noted  ? Extremities/Musculoskeletal:  Moves all extremities through full active ROM  ? Gait ambulates independently without difficulties    ______________________________________________________________  Orest Dikes, DO

## 2021-03-24 NOTE — Progress Notes
Chart check completed

## 2021-03-24 NOTE — Care Plan
RN Shift Assessment    Pt observed socializing with peers in milieu. A/O x4. Rated anxiety 2/10 and reported VH of TV static, colorful dots, seeing 10 to the power of 15 and so on, but did not appear to be responding to internal stimuli. Denied feeling depressed, SI/HI/AH or having any pain. Compliant with medications. Will continue to monitor pt per protocol.    Mental Status Exam  Legal Status: Voluntary Admission  General Appearance: Normal  Mood / Affect: Congruent  Speech: Normal  Content Of Thought: Visual Hallucinations, Blames Others  Motor Activity: Normal  Flow of Thought: Linear  Insight / Judgment: Fair insight  Behavior: Cooperative  Patient Strengths: Interpersonal relationships and supports, i.e. family, friends, peers     2143  PRN Medication Administered:  Trazodone 50 mg tablet PO  Non pharmacological interventions attempted prior to PRN medication administration:  Decrease stimuli  Brief narrative of reason for PRN medication administration:   Pt reported difficulty falling asleep and requested PRN med    Problem: Health Maintenance - Impaired  Goal: Able to perfom ADL's  Outcome: Goal Ongoing  Goal: Adequate nutritional intake  Outcome: Goal Ongoing  Goal: Establish therapeutic relationships  Outcome: Goal Ongoing  Goal: Knowledge of health maintenance  Outcome: Goal Ongoing     Problem: Mood - Altered  Goal: Stabilize mood  Outcome: Goal Ongoing  Goal: Knowledge of Altered Mood  Outcome: Goal Ongoing     Problem: Self-esteem - Low  Goal: Demonstration of positive self-esteem  Outcome: Goal Ongoing  Goal: Knowledge of low self-esteem  Outcome: Goal Ongoing     Problem: Thought Process - Altered  Goal: Demonstration of organized thought processes  Outcome: Goal Ongoing  Goal: Knowledge of altered thought process  Outcome: Goal Ongoing     Problem: Violence, self/other-directed, Risk of  Goal: Absence of violence  Outcome: Goal Ongoing  Goal: Knowledge of risk for violence, self/other-directed  Outcome: Goal Ongoing     Problem: Transition Readiness  Goal: Knowledge of transition readiness  Outcome: Goal Ongoing     Problem: Cognitive-Perceptual Pattern - Impaired  Goal: Demonstration of organized thought processes  Outcome: Goal Ongoing  Goal: Knowledge of prescribed medication  Outcome: Goal Ongoing     Problem: Coping - Ineffective, Family  Goal: Effective Coping  Outcome: Goal Ongoing  Goal: Communicate with support staff  Outcome: Goal Ongoing  Goal: Knowledge of positive coping methods  Outcome: Goal Ongoing     Problem: Social Interaction - Impaired  Goal: Increased Social Interaction  Outcome: Goal Ongoing  Goal: Knowledge of social interaction  Outcome: Goal Ongoing

## 2021-03-24 NOTE — Progress Notes
Chart check completed.

## 2021-03-24 NOTE — Behavioral Health Treatment Team
TREATMENT TEAM NOTE  Name: Jamie [name]        MRN: [id number]          DOB: 1996-01-23            Age: 25 y.o.  Admission Date: 03/16/2021       LOS: 0 days    Date of Service: 03/24/2021        Attendees:    Nursing: Raynaldo Opitz RN  Therapy: Clement Husbands, LSCSW, [facility]  CM: Henderson Cloud, LMSW  CM: Neysa Bonito, [facility]  Music Therapy: Berle Mull, [facility], [place]  Pharmacy: Royetta Crochet, PharmD  Psychology: Assunta Gambles. Lowry Bowl, PhD  Psychiatry: Haroldine Laws, DO   Psychiatry: Frankey Poot, APRN-NP  Psychiatry Resident: Carney Corners, DO  Psychiatry Resident: Maura Crandall, DO     Significant Points Discussed:   Poor boundaries with peer   CM following  Moderately engaged in groups    Treatment Plan Discussion:  Continue to monitor

## 2021-03-25 NOTE — Progress Notes
PSYCHIATRIC PROGRESS NOTE     LOS: 0 days  Voluntary/Involuntary Status: Voluntary  Guardian: Parents ([name] [place], Kent Yoss)     MEDICATIONS     Current Psychotropic Medications:   1. [name] IM every 28 days (last injection 03/23/2021)  2. Depakote ER 2500 mg QHS     ASSESSMENT & DIAGNOSES     Jamie [name] is a 25 y.o. Caucasian adult with a history of schizoaffective disorder and polysubstance abuse who presented to [facility] as a transfer from OSH with manic symptoms in context of methamphetamine use. This appears to be precipitated by methamphetamine use. Factors that seem to have predisposed him to mania include schizoaffective disorder, substance abuse and uncle with schizophrenia. This current problem is maintained by ongoing substance abuse, lack of consistent medication compliance, and poor insight. However, protective factors include good support from parents who are guardians, motivation to seek treatment, and fair insight into current situation. Proposed treatment will consist of substance detoxification and medication management.    DSM-5 DIAGNOSES:  1. Stimulant use disorder, methamphetamine type, moderate  2. Methamphetamine withdrawal  3. Schizoaffective disorder, bipolar type  4. Tobacco use disorder (chewing tobacco), severe  5. Stimulant use disorder, cocaine type, unspecified severity     PLAN     ? No indications for medication changes on 03/25/2021.  ? Received [name] 156 mg IM second dose of initiation on 03/23/2021  ? Continue Depakote ER to PTA dose of 2500 mg QHS for schizoaffective disorder and mood stabilization  ? Pending level 1 CARE assessment      Metabolic labs completed on:  Lipid Panel:   LDL   Date Value Ref Range Status   03/19/2021 106 (H) <100 mg/dL Final     HDL   Date Value Ref Range Status   03/19/2021 55 >40 MG/DL Final     VLDL   Date Value Ref Range Status   03/19/2021 15 MG/DL Final     Cholesterol   Date Value Ref Range Status   03/19/2021 174 <200 MG/DL Final     Triglycerides   Date Value Ref Range Status   03/19/2021 76 <150 MG/DL Final     Blood Glucose:  Hemoglobin A1C   Date Value Ref Range Status   03/19/2021 5.3 4.0 - 6.0 % Final     Comment:     The ADA recommends that most patients with type 1 and type 2 diabetes maintain   an A1c level <7%.       Disposition: Patient has legal guardian. He is unable to leave AMA unless approved by guardian. Pending evaluation for placement.  ?  Seen and discussed with Dr. Izell Carolina.  ______________________________________________________________     SUBJECTIVE     Jamie [name] is found today eating breakfast. He is very irritated about still being here and the idea of going to a facility. States he does not need to go to one and is very frustrated about missing work to stay here. He denies side effects from his medications other than arm a little sore from his injection. Sleeping well. Appetite good. Denies SI/HI/AH. VH unchanged. Patient has not been actively participating in group therapy recently because he feels that he does not need therapy at this time. Patient is under the impression that he does not have any additional mental health needs despite continued irritability and grandiosity.     REVIEW OF SYSTEMS   Review of Systems   Constitutional: Negative for activity change.  HENT: Negative for sore throat.    Eyes: Negative for pain.   Respiratory: Negative for shortness of breath.    Cardiovascular: Negative for chest pain.   Gastrointestinal: Negative for constipation and diarrhea.   Genitourinary: Negative for dysuria.   Musculoskeletal: Negative for myalgias.   Neurological: Negative for dizziness and headaches.   Psychiatric/Behavioral: Negative for agitation.      OBJECTIVE                    Vital Signs:  Current                Vital Signs: 24 Hour Range   BP: 122/76 (05/25 1900)  Temp: 36.9 ?C (98.4 ?F) (05/25 1900)  Pulse: 105 (05/25 1900)  Respirations: 18 PER MINUTE (05/25 1900)  SpO2: 97 % (05/25 1900) BP: (122)/(76)   Temp:  [36.9 ?C (98.4 ?F)]   Pulse:  [105]   Respirations:  [18 PER MINUTE]   SpO2:  [97 %]    Intensity Pain Scale (Self Report): (not recorded)      Sleep:  Hours of Sleep: 6.25   Quality of Sleep: Resting Quietly    Scheduled Medications:  divalproex (DEPAKOTE ER) ER tablet 2,500 mg, 2,500 mg, Oral, QHS  nicotine (NICODERM CQ STEP 1) 21 mg/day patch 1 patch, 1 patch, Transdermal, QDAY    PRN Medications:  acetaminophen Q6H PRN, calcium carbonate TID PRN, nicotine polacrilex Q2H PRN 4 mg at 03/24/21 2023, OLANZapine Q6H PRN **OR** OLANZapine Q6H PRN, polyethylene glycol 3350 QDAY PRN, traZODone QHS PRN 50 mg at 03/24/21 2119    Mental Status Exam:  ? General/Constitutional: 25 y.o. adult, appears stated age, dressed  ? in hospital clothes, appropriate grooming  ? Behavior: calm, cooperative, mostly pleasant  ? Speech/Motor: increased rate (bordering on pressured) regular volume and rhythm  ? Eye Contact: fair  ? Mood: bored and irritated  ? Affect: irritable, congruent with stated mood  ? Thought Process: linear and goal directed  ? Associations: Intact  ? Thought Content: Denies SI/HI. Shows no evidence of delusions or paranoia   ? Perception: denies AH/ endorses VH. Does not appear to be responding to internal stimuli   ? Insight/Judgment: fair/poor  ? Orientation: Alert and awake  ? Recent and Remote Memory: grossly intact  ? Attention span and concentration: poor  ? Language: fluent  ? Fund of knowledge and vocabulary: average    Focused Physical Exam:  ? Eyes: EOMI, no scleral icterus noted  ? Extremities/Musculoskeletal:  Moves all extremities through full active ROM  ? Gait ambulates independently without difficulties    ______________________________________________________________  [name]     A medical student assisted me in the completion of this note. I performed my own evaluation of this patient, and agree with the student's above assessment.?  Orest Dikes, DO

## 2021-03-25 NOTE — Care Plan
Problem: Health Maintenance - Impaired  Goal: Able to perfom ADL's  Outcome: Goal Ongoing  Goal: Adequate nutritional intake  Outcome: Goal Ongoing  Goal: Establish therapeutic relationships  Outcome: Goal Ongoing  Goal: Knowledge of health maintenance  Outcome: Goal Ongoing     Problem: Mood - Altered  Goal: Stabilize mood  Outcome: Goal Ongoing  Goal: Knowledge of Altered Mood  Outcome: Goal Ongoing     Problem: Self-esteem - Low  Goal: Demonstration of positive self-esteem  Outcome: Goal Ongoing  Goal: Knowledge of low self-esteem  Outcome: Goal Ongoing     Problem: Thought Process - Altered  Goal: Demonstration of organized thought processes  Outcome: Goal Ongoing  Goal: Knowledge of altered thought process  Outcome: Goal Ongoing     Problem: Violence, self/other-directed, Risk of  Goal: Absence of violence  Outcome: Goal Ongoing  Goal: Knowledge of risk for violence, self/other-directed  Outcome: Goal Ongoing     Problem: Transition Readiness  Goal: Knowledge of transition readiness  Outcome: Goal Ongoing     Problem: Cognitive-Perceptual Pattern - Impaired  Goal: Demonstration of organized thought processes  Outcome: Goal Ongoing  Goal: Knowledge of prescribed medication  Outcome: Goal Ongoing     Problem: Coping - Ineffective, Family  Goal: Effective Coping  Outcome: Goal Ongoing  Goal: Communicate with support staff  Outcome: Goal Ongoing  Goal: Knowledge of positive coping methods  Outcome: Goal Ongoing     Problem: Social Interaction - Impaired  Goal: Increased Social Interaction  Outcome: Goal Ongoing  Goal: Knowledge of social interaction  Outcome: Goal Ongoing

## 2021-03-25 NOTE — Progress Notes
Chart check completed.

## 2021-03-25 NOTE — Progress Notes
A medication education group was performed today, and Nichola Warren Udall attended ~66min of the group.  They participated minimally in group and demonstrated good understanding of material discussed.    Common psychiatric medications were reviewed for indications, side effects, and onset of effect.  Methods for medication adherence were addressed with group.      The patient did not have any specific questions for group but he did contribute to discussion regarding lithium usage and also provided commentary about personal experience with other medications.     Will discuss with team in rounds.      Group Leader:  Hazle Quant, PHARMD

## 2021-03-25 NOTE — Progress Notes
Psychology ([phone])    Service(s): Individual Counseling and Psychotherapy    Presentation: Met with Shawnte to provide individual support. He reported that his parents continue to insist that he be placed in a higher level of care, but he does not want to pursue Level 2 care. Engaged [place] in discussion about the reasons for which it would, and would not, be a good idea for him. Nalin discussed his abilities and identified the most pressing problems that hold him back from reaching his potential. He expressed a good understanding of his need to remain on his medication and remain abstinent from drugs and alcohol. We discussed his motivation and how it hasn't historically aligned with his behavior. Discussed the importance of structuring his environment in a way that makes it more likely that he will successfully navigate his trouble with Bipolar symptoms.     MSE: Casually dressed and groomed, A+Ox3, mood "good fair range of affect, euthymic, appropriate humor, eye contact was WNL, speech WNL, TP linear and goal-directed, TC was negative for SI, HI, AVH, insight and judgement were fair to good, actively engaged in session.    Psychotherapy: Utilized motivational interviewing, gentle confrontation, and CBT principles to help patient explore his maladaptive behavior in the context of his mental health problems. Encouraged perspective taking through mentalization-based interventions. Pt expressed gratitude for the session.     Plan: Will continue to follow    Winifred Olive, Ph.D.   Psychology Postdoctoral Fellow  Pager [phone]  On [facility] [place]

## 2021-03-25 NOTE — Care Coordination-Inpatient
Care Coordination - Inpatient Note      Patient Name:   Jamie [name]                        MRN:  [id number]  Admission Date:  03/16/2021    3:22 pm  CM called Pt's mom and guardian, Jamie [name] [phone]), to provide an update and discuss placement for Pt. Jamie [name] did not answer, CM left voicemail requesting call back.     Jamie [name] [employer]  03/25/2021

## 2021-03-25 NOTE — Progress Notes
PRN Medication Administered:  Trazodone  Non pharmacological interventions attempted prior to PRN medication administration:  Decrease stimuli    Brief narrative of reason for PRN medication administration: Pt requested as a sleep aid.

## 2021-03-25 NOTE — Behavioral Health Treatment Team
TREATMENT TEAM NOTE  Name: Amond Speranza        MRN: [id number]          DOB: [birth year]            Age: 25 y.o.  Admission Date: 03/16/2021       LOS: 0 days    Date of Service: 03/25/2021        Attendees:  UR: Ival Bible, LMSW  Nursing: Freddie Apley, RN, BSN  Therapy: Clement Husbands, LSCSW, [facility]  CM: Henderson Cloud, LMSW  CM: Neysa Bonito, [facility]  Activities Coordinator: Philippa Chester  Pharmacy: Darrin Luis, PharmD  Psychology: Assunta Gambles. Lowry Bowl, PhD  Psychology: Madie Reno. MS  Psychology: Darryl Lent, MS  Psychology: Peter Minium, PhD  Psychiatry: Frankey Poot, APRN-NP  Psychiatry: Audie Pinto, DO  Psychiatry Resident: Orest Dikes, DO  Psychiatry Resident: Maura Crandall, DO      Significant Points Discussed: Pt declining attending groups. Pacing and guarded in hallway.    Treatment Plan Discussion: Awaiting placement

## 2021-03-25 NOTE — Progress Notes
Chart check completed

## 2021-03-25 NOTE — Care Plan
Pt visible in the milieu most of this evening. He mostly interacted with one male peer but this seemed to be her choice and he remained appropriate. Pt complied with scheduled medications and prn Trazodone. He reported feeling ready to discharge but stated he understands he has to wait for a placement assessment to "keep my parents happy." Will continue to monitor for safety and changes in behavior.     Problem: Health Maintenance - Impaired  Goal: Establish therapeutic relationships  Outcome: Goal Ongoing     Problem: Transition Readiness  Goal: Knowledge of transition readiness  Outcome: Goal Ongoing     Problem: Cognitive-Perceptual Pattern - Impaired  Goal: Knowledge of prescribed medication  Outcome: Goal Ongoing     Problem: Coping - Ineffective, Family  Goal: Effective Coping  Outcome: Goal Ongoing  Goal: Communicate with support staff  Outcome: Goal Ongoing  Goal: Knowledge of positive coping methods  Outcome: Goal Ongoing     Problem: Social Interaction - Impaired  Goal: Increased Social Interaction  Outcome: Goal Ongoing

## 2021-03-26 NOTE — Progress Notes
Pt sleepy at time of assessment but was able to answer questions.  Pt denies SI/HI/AH/VH.  Pt denies anxiety/depression and pain.  Pt milieu for am meal.  Pt medication compliant.

## 2021-03-26 NOTE — Progress Notes
PSYCHIATRIC PROGRESS NOTE     LOS: 0 days  Voluntary/Involuntary Status: Voluntary  Guardian: Parents (Jamie [name], Jamie [name])     MEDICATIONS     Current Psychotropic Medications:   1. [name] IM every 28 days (last injection 03/23/2021)  2. Depakote ER 2500 mg QHS     ASSESSMENT & DIAGNOSES     Jamie [name] is a 25 y.o. Caucasian adult with a history of schizoaffective disorder and polysubstance abuse who presented to [facility] as a transfer from OSH with manic symptoms in context of methamphetamine use. This appears to be precipitated by methamphetamine use. Factors that seem to have predisposed him to mania include schizoaffective disorder, substance abuse and uncle with schizophrenia. This current problem is maintained by ongoing substance abuse, lack of consistent medication compliance, and poor insight. However, protective factors include good support from parents who are guardians, motivation to seek treatment, and fair insight into current situation. Proposed treatment will consist of substance detoxification and medication management.    DSM-5 DIAGNOSES:  1. Stimulant use disorder, methamphetamine type, moderate  2. Methamphetamine withdrawal  3. Schizoaffective disorder, bipolar type  4. Tobacco use disorder (chewing tobacco), severe  5. Stimulant use disorder, cocaine type, unspecified severity     PLAN     ? No indications for medication changes on 03/25/2021.  ? Received [name] 156 mg IM second dose of initiation on 03/23/2021  ? Continue Depakote ER to PTA dose of 2500 mg QHS for schizoaffective disorder and mood stabilization  ? Pending level 1 CARE assessment      Metabolic labs completed on:  Lipid Panel:   LDL   Date Value Ref Range Status   03/19/2021 106 (H) <100 mg/dL Final     HDL   Date Value Ref Range Status   03/19/2021 55 >40 MG/DL Final     VLDL   Date Value Ref Range Status   03/19/2021 15 MG/DL Final     Cholesterol   Date Value Ref Range Status   03/19/2021 174 <200 MG/DL Final     Triglycerides   Date Value Ref Range Status   03/19/2021 76 <150 MG/DL Final     Blood Glucose:  Hemoglobin A1C   Date Value Ref Range Status   03/19/2021 5.3 4.0 - 6.0 % Final     Comment:     The ADA recommends that most patients with type 1 and type 2 diabetes maintain   an A1c level <7%.       Disposition: Patient has legal guardian. He is unable to leave AMA unless approved by guardian. Pending evaluation for placement.  ?  Seen and discussed with Dr. Izell Carolina.  ______________________________________________________________     SUBJECTIVE     [name] is found today eating breakfast. Patient continues to be frustrated by the pending evaluation for placement. He argues that someone is not doing their job but states I know that it is not you guys. He has been sleeping without difficulty and has been consuming his meals. He denies any current depressed mood. He denies any suicidal or homicidal thoughts. He continues to have unchanged visual disturbances of flashing lights. He denies any auditory hallucinations.      REVIEW OF SYSTEMS   Review of Systems   Constitutional: Negative for activity change.   HENT: Negative for sore throat.    Eyes: Negative for pain.   Respiratory: Negative for shortness of breath.    Cardiovascular: Negative for chest pain.  Gastrointestinal: Negative for constipation and diarrhea.   Genitourinary: Negative for dysuria.   Musculoskeletal: Negative for myalgias.   Neurological: Negative for dizziness and headaches.   Psychiatric/Behavioral: Negative for agitation.      OBJECTIVE                    Vital Signs:  Current                Vital Signs: 24 Hour Range   BP: 130/70 (05/26 1923)  Temp: 37.1 ?C (98.8 ?F) (05/26 1923)  Pulse: 93 (05/26 1923)  Respirations: 18 PER MINUTE (05/26 1923)  SpO2: 96 % (05/26 1923) BP: (130)/(70)   Temp:  [37.1 ?C (98.8 ?F)]   Pulse:  [93]   Respirations:  [18 PER MINUTE]   SpO2:  [96 %]    Intensity Pain Scale (Self Report): (not recorded)      Sleep:  Hours of Sleep: 2   Quality of Sleep: Restless    Scheduled Medications:  divalproex (DEPAKOTE ER) ER tablet 2,500 mg, 2,500 mg, Oral, QHS  nicotine (NICODERM CQ STEP 1) 21 mg/day patch 1 patch, 1 patch, Transdermal, QDAY    PRN Medications:  acetaminophen Q6H PRN, calcium carbonate TID PRN, nicotine polacrilex Q2H PRN 4 mg at 03/25/21 1829, OLANZapine Q6H PRN **OR** OLANZapine Q6H PRN, polyethylene glycol 3350 QDAY PRN, traZODone QHS PRN 50 mg at 03/25/21 2059    Mental Status Exam:  ? General/Constitutional: 25 y.o. adult, appears stated age, dressed  ? in hospital clothes, appropriate grooming  ? Behavior: calm, cooperative, mostly pleasant  ? Speech/Motor: regular rate, volume and rhythm  ? Eye Contact: fair  ? Mood: frustrated  ? Affect: irritable, congruent with stated mood  ? Thought Process: linear and goal directed  ? Associations: Intact  ? Thought Content: Denies SI/HI. Shows no evidence of delusions or paranoia   ? Perception: denies AH/ endorses VH. Does not appear to be responding to internal stimuli   ? Insight/Judgment: fair/poor  ? Orientation: Alert and awake  ? Recent and Remote Memory: grossly intact  ? Attention span and concentration: poor  ? Language: fluent  ? Fund of knowledge and vocabulary: average    Focused Physical Exam:  ? Eyes: EOMI, no scleral icterus noted  ? Extremities/Musculoskeletal:  Moves all extremities through full active ROM  ? Gait ambulates independently without difficulties    ______________________________________________________________  Orest Dikes, DO

## 2021-03-26 NOTE — Behavioral Health Treatment Team
TREATMENT TEAM NOTE  Name: Jamie [name]        MRN: [id number]          DOB: 28-Sep-1996            Age: 25 y.o.  Admission Date: 03/16/2021       LOS: 0 days    Date of Service: 03/26/2021        Attendees:  UR: Ival Bible, LMSW  Nursing: Freddie Apley, RN, BSN  Therapy: Clement Husbands, LSCSW, [facility]  CM: Henderson Cloud, LMSW  CM: Neysa Bonito, [facility]  Music Therapy: Berle Mull, [facility], [facility]  Activities Coordinator: Philippa Chester  Pharmacy: Darrin Luis, PharmD  Psychiatry: Frankey Poot, APRN-NP  Psychiatry: Audie Pinto, DO  Psychiatry Resident: Orest Dikes, DO  Psychiatry Resident: Maura Crandall, DO      Significant Points Discussed: Socializing with peer endorsing ah/vh. Attempting to attend groups but is unable to fully engage due to disruptive behaviors    Treatment Plan Discussion: waiting on placement

## 2021-03-26 NOTE — Care Plan
RN Shift Assessment    Pt observed in milieu socializing with peers. A/Ox 4. Reported the usual VH, but did not appear to be responding to internal stimuli. Denied anxiety, depression, SI/HI, AVH, and pain. Pt discussed frustrations about going to a level 1 facility, waiting for evaluation, and blaming parents for wanting to punish him. Compliant with medications. Will continue to monitor pt per protocol.    Mental Status Exam  Legal Status: Voluntary Admission  General Appearance: Normal  Mood / Affect: Congruent  Speech: Normal  Content Of Thought: Ideas of Hopelessness  Motor Activity: Normal  Flow of Thought: Linear, Tangential  Insight / Judgment: Fair insight, Fair judgment  Behavior: Cooperative  Patient Strengths: Interpersonal relationships and supports, i.e. family, friends, peers     Problem: Health Maintenance - Impaired  Goal: Able to perfom ADL's  Outcome: Goal Ongoing  Goal: Adequate nutritional intake  Outcome: Goal Ongoing  Goal: Establish therapeutic relationships  Outcome: Goal Ongoing  Goal: Knowledge of health maintenance  Outcome: Goal Ongoing     Problem: Mood - Altered  Goal: Stabilize mood  Outcome: Goal Ongoing  Goal: Knowledge of Altered Mood  Outcome: Goal Ongoing     Problem: Self-esteem - Low  Goal: Demonstration of positive self-esteem  Outcome: Goal Ongoing  Goal: Knowledge of low self-esteem  Outcome: Goal Ongoing     Problem: Thought Process - Altered  Goal: Demonstration of organized thought processes  Outcome: Goal Ongoing  Goal: Knowledge of altered thought process  Outcome: Goal Ongoing     Problem: Violence, self/other-directed, Risk of  Goal: Absence of violence  Outcome: Goal Ongoing  Goal: Knowledge of risk for violence, self/other-directed  Outcome: Goal Ongoing     Problem: Transition Readiness  Goal: Knowledge of transition readiness  Outcome: Goal Ongoing     Problem: Cognitive-Perceptual Pattern - Impaired  Goal: Demonstration of organized thought processes  Outcome: Goal Ongoing  Goal: Knowledge of prescribed medication  Outcome: Goal Ongoing     Problem: Coping - Ineffective, Family  Goal: Effective Coping  Outcome: Goal Ongoing  Goal: Communicate with support staff  Outcome: Goal Ongoing  Goal: Knowledge of positive coping methods  Outcome: Goal Ongoing     Problem: Social Interaction - Impaired  Goal: Increased Social Interaction  Outcome: Goal Ongoing  Goal: Knowledge of social interaction  Outcome: Goal Ongoing

## 2021-03-26 NOTE — Progress Notes
Chart check completed.

## 2021-03-27 NOTE — ED Notes
Pt has been calm and cooperative this evening. Pt was compliant with HS medications. His mood is stable. Pt reports to have some concern over being screened for level 2 placement. Pt denies any current SI, HI, AH or VH. Denies somatic pain. Pt will remain on Q 15 safety checks. No issues at this time.

## 2021-03-27 NOTE — ED Notes
Chart check completed.

## 2021-03-27 NOTE — Progress Notes
PSYCHIATRIC PROGRESS NOTE     LOS: 0 days  Voluntary/Involuntary Status: Voluntary  Guardian: Parents ([name] [place], Kent Denning)     MEDICATIONS     Current Psychotropic Medications:   1. [name] IM every 28 days (last injection 03/23/2021)  2. Depakote ER 2500 mg QHS     ASSESSMENT & DIAGNOSES     Cooper Valcourt is a 25 y.o. Caucasian adult with a history of schizoaffective disorder and polysubstance abuse who presented to [facility] as a transfer from OSH with manic symptoms in context of methamphetamine use. This appears to be precipitated by methamphetamine use. Factors that seem to have predisposed him to mania include schizoaffective disorder, substance abuse and uncle with schizophrenia. This current problem is maintained by ongoing substance abuse, lack of consistent medication compliance, and poor insight. However, protective factors include good support from parents who are guardians, motivation to seek treatment, and fair insight into current situation. Proposed treatment will consist of substance detoxification and medication management.    DSM-5 DIAGNOSES:  1. Stimulant use disorder, methamphetamine type, moderate  2. Methamphetamine withdrawal  3. Schizoaffective disorder, bipolar type  4. Tobacco use disorder (chewing tobacco), severe  5. Stimulant use disorder, cocaine type, unspecified severity     PLAN     ? No indications for medication changes on 03/27/2021.  ? Received [name] 156 mg IM second dose of initiation on 03/23/2021  ? Continue Depakote ER to PTA dose of 2500 mg QHS for schizoaffective disorder and mood stabilization  ? Pending level 1 CARE assessment      Metabolic labs completed on:  Lipid Panel:   LDL   Date Value Ref Range Status   03/19/2021 106 (H) <100 mg/dL Final     HDL   Date Value Ref Range Status   03/19/2021 55 >40 MG/DL Final     VLDL   Date Value Ref Range Status   03/19/2021 15 MG/DL Final     Cholesterol   Date Value Ref Range Status   03/19/2021 174 <200 MG/DL Final     Triglycerides   Date Value Ref Range Status   03/19/2021 76 <150 MG/DL Final     Blood Glucose:  Hemoglobin A1C   Date Value Ref Range Status   03/19/2021 5.3 4.0 - 6.0 % Final     Comment:     The ADA recommends that most patients with type 1 and type 2 diabetes maintain   an A1c level <7%.       Disposition: Patient has legal guardian. He is unable to leave AMA unless approved by guardian. Pending evaluation for placement.  ?  Seen and discussed with Dr. Gilmore Laroche  ______________________________________________________________     SUBJECTIVE     Mr. Jamie [name] is found sleeping in his hospital bed this morning prior to interview. He is easy to wake with verbal stimuli only. Patient reports that he slept poorly last night because he woke frequently throughout the night. Patient indicates that he took multiple naps throughout the day yesterday. Discussed with the patient that he should attempt to avoid sleeping during the day to regulate his circadian rhythm and improve his nightly sleep. Patient understands and agrees to attempt this plan. He denies any changes in his appetite and is later found eating breakfast in the group room. Per nursing report, he slept for over seven hours last night and has been consuming all of his meals without difficulty. He denies any depressed mood or anxiety despite irritable  affect. He denies any suicidal ideation, homicidal ideation, auditory hallucinations. His visual hallucinations remain unchanged. Patient continues to isolate to his room with little interaction and participation with group therapy. He has not required any PRNs for agitation within the last 24 hours.     REVIEW OF SYSTEMS   Review of Systems   Constitutional: Negative for activity change.   HENT: Negative for sore throat.    Eyes: Negative for pain.   Respiratory: Negative for shortness of breath.    Cardiovascular: Negative for chest pain.   Gastrointestinal: Negative for constipation and diarrhea. Genitourinary: Negative for dysuria.   Musculoskeletal: Negative for myalgias.   Neurological: Negative for dizziness and headaches.   Psychiatric/Behavioral: Negative for agitation.      OBJECTIVE                    Vital Signs:  Current                Vital Signs: 24 Hour Range   BP: 126/71 (05/28 0700)  Temp: 37 ?C (98.6 ?F) (05/28 0700)  Pulse: 75 (05/28 0700)  Respirations: 16 PER MINUTE (05/28 0700)  SpO2: 97 % (05/28 0700) BP: (126-128)/(71-81)   Temp:  [36.9 ?C (98.4 ?F)-37 ?C (98.6 ?F)]   Pulse:  [75-93]   Respirations:  [16 PER MINUTE]   SpO2:  [97 %]    Intensity Pain Scale (Self Report): (not recorded)      Sleep:  Hours of Sleep: 7.75   Quality of Sleep: Resting Quietly    Scheduled Medications:  divalproex (DEPAKOTE ER) ER tablet 2,500 mg, 2,500 mg, Oral, QHS  nicotine (NICODERM CQ STEP 1) 21 mg/day patch 1 patch, 1 patch, Transdermal, QDAY    PRN Medications:  acetaminophen Q6H PRN, calcium carbonate TID PRN, nicotine polacrilex Q2H PRN 4 mg at 03/26/21 1638, OLANZapine Q6H PRN **OR** OLANZapine Q6H PRN, polyethylene glycol 3350 QDAY PRN, traZODone QHS PRN 50 mg at 03/26/21 2120    Mental Status Exam:  ? General/Constitutional: 25 y.o. adult, appears stated age, dressed  ? in hospital clothes, appropriate grooming  ? Behavior: calm, cooperative, mostly pleasant  ? Speech/Motor: regular rate, volume and rhythm  ? Eye Contact: fair  ? Mood: alright  ? Affect: irritable, incongruent with stated mood  ? Thought Process: linear and goal directed  ? Associations: Intact  ? Thought Content: Denies SI/HI. Shows no evidence of delusions or paranoia   ? Perception: denies AH/ endorses VH. Does not appear to be responding to internal stimuli   ? Insight/Judgment: fair/poor  ? Orientation: Alert and awake  ? Recent and Remote Memory: grossly intact  ? Attention span and concentration: poor  ? Language: fluent  ? Fund of knowledge and vocabulary: average    Focused Physical Exam:  ? Eyes: EOMI, no scleral icterus noted  ? Extremities/Musculoskeletal:  Moves all extremities through full active ROM  ? Gait ambulates independently without difficulties    ______________________________________________________________  Orest Dikes, DO

## 2021-03-27 NOTE — Care Plan
Jamie [name] is compliant with medications.  Pt denies SIHISH.  Pt has VH of little floaters, dots, and stars.  Pt denies AH.  Pt states it was hard to get to sleep and stay asleep and slept about two hours.  Pt states appetite is fine and had two sandwiches.  Pt states mood is bored and ready to find placement.  Pt denies anxiety and depression.  Pt is withdrawn and encouraged him to attend groups.  Jamie [name] denies any questions or concerns at this time.    Problem: Health Maintenance - Impaired  Goal: Able to perfom ADL's  Outcome: Goal Ongoing  Goal: Adequate nutritional intake  Outcome: Goal Ongoing  Goal: Establish therapeutic relationships  Outcome: Goal Ongoing  Goal: Knowledge of health maintenance  Outcome: Goal Ongoing     Problem: Mood - Altered  Goal: Stabilize mood  Outcome: Goal Ongoing  Goal: Knowledge of Altered Mood  Outcome: Goal Ongoing     Problem: Self-esteem - Low  Goal: Demonstration of positive self-esteem  Outcome: Goal Ongoing  Goal: Knowledge of low self-esteem  Outcome: Goal Ongoing     Problem: Thought Process - Altered  Goal: Demonstration of organized thought processes  Outcome: Goal Ongoing  Goal: Knowledge of altered thought process  Outcome: Goal Ongoing     Problem: Violence, self/other-directed, Risk of  Goal: Absence of violence  Outcome: Goal Ongoing  Goal: Knowledge of risk for violence, self/other-directed  Outcome: Goal Ongoing     Problem: Transition Readiness  Goal: Knowledge of transition readiness  Outcome: Goal Ongoing     Problem: Cognitive-Perceptual Pattern - Impaired  Goal: Demonstration of organized thought processes  Outcome: Goal Ongoing  Goal: Knowledge of prescribed medication  Outcome: Goal Ongoing     Problem: Coping - Ineffective, Family  Goal: Effective Coping  Outcome: Goal Ongoing  Goal: Communicate with support staff  Outcome: Goal Ongoing  Goal: Knowledge of positive coping methods  Outcome: Goal Ongoing     Problem: Social Interaction - Impaired  Goal: Increased Social Interaction  Outcome: Goal Ongoing  Goal: Knowledge of social interaction  Outcome: Goal Ongoing

## 2021-03-27 NOTE — Progress Notes
Chart check completed.

## 2021-03-28 NOTE — Progress Notes
Chart check completed.

## 2021-03-28 NOTE — Care Plan
Denies S/I,H/I,AV/H. Medication compliant. Eating drinking well. Partial groups throughout day. No management issues.  Problem: Health Maintenance - Impaired  Goal: Able to perfom ADL's  Outcome: Goal Ongoing  Goal: Adequate nutritional intake  Outcome: Goal Ongoing  Goal: Establish therapeutic relationships  Outcome: Goal Ongoing  Goal: Knowledge of health maintenance  Outcome: Goal Ongoing     Problem: Mood - Altered  Goal: Stabilize mood  Outcome: Goal Ongoing  Goal: Knowledge of Altered Mood  Outcome: Goal Ongoing     Problem: Self-esteem - Low  Goal: Demonstration of positive self-esteem  Outcome: Goal Ongoing  Goal: Knowledge of low self-esteem  Outcome: Goal Ongoing     Problem: Thought Process - Altered  Goal: Demonstration of organized thought processes  Outcome: Goal Ongoing  Goal: Knowledge of altered thought process  Outcome: Goal Ongoing     Problem: Transition Readiness  Goal: Knowledge of transition readiness  Outcome: Goal Ongoing     Problem: Cognitive-Perceptual Pattern - Impaired  Goal: Demonstration of organized thought processes  Outcome: Goal Ongoing  Goal: Knowledge of prescribed medication  Outcome: Goal Ongoing

## 2021-03-28 NOTE — Progress Notes
PSYCHIATRIC PROGRESS NOTE     LOS: 0 days  Voluntary/Involuntary Status: Voluntary  Guardian: Parents ([name] Pioneer, Kent Rather)     MEDICATIONS     Current Psychotropic Medications:   1. [name] IM every 28 days (last injection 03/23/2021)  2. Depakote ER 2500 mg QHS     ASSESSMENT & DIAGNOSES     Jamie [name] is a 25 y.o. Caucasian adult with a history of schizoaffective disorder and polysubstance abuse who presented to [facility] as a transfer from OSH with manic symptoms in context of methamphetamine use. This appears to be precipitated by methamphetamine use. Factors that seem to have predisposed him to mania include schizoaffective disorder, substance abuse and uncle with schizophrenia. This current problem is maintained by ongoing substance abuse, lack of consistent medication compliance, and poor insight. However, protective factors include good support from parents who are guardians, motivation to seek treatment, and fair insight into current situation. Proposed treatment will consist of substance detoxification and medication management.    DSM-5 DIAGNOSES:  1. Stimulant use disorder, methamphetamine type, moderate  2. Methamphetamine withdrawal  3. Schizoaffective disorder, bipolar type  4. Tobacco use disorder (chewing tobacco), severe  5. Stimulant use disorder, cocaine type, unspecified severity     PLAN     ? No indications for medication changes on 03/28/2021. Continue care as per primary team outlined below:    ? Received Invega Sustenna 156 mg IM second dose of initiation on 03/23/2021  ? Continue Depakote ER to PTA dose of 2500 mg QHS for schizoaffective disorder and mood stabilization  ? Pending level 1 CARE assessment      Metabolic labs completed on:  Lipid Panel:   LDL   Date Value Ref Range Status   03/19/2021 106 (H) <100 mg/dL Final     HDL   Date Value Ref Range Status   03/19/2021 55 >40 MG/DL Final     VLDL   Date Value Ref Range Status   03/19/2021 15 MG/DL Final Cholesterol   Date Value Ref Range Status   03/19/2021 174 <200 MG/DL Final     Triglycerides   Date Value Ref Range Status   03/19/2021 76 <150 MG/DL Final     Blood Glucose:  Hemoglobin A1C   Date Value Ref Range Status   03/19/2021 5.3 4.0 - 6.0 % Final     Comment:     The ADA recommends that most patients with type 1 and type 2 diabetes maintain   an A1c level <7%.       Disposition: Patient has legal guardian. He is unable to leave AMA unless approved by guardian. Pending evaluation for placement.  ?  Seen and discussed with Dr. Gilmore Laroche  ______________________________________________________________     SUBJECTIVE     No PRN usage in past 24 hours with exception of trazodone for sleep at 2137 last night. Per chart, slept 7 hours and has been eating 100% of his meals. Notes indicate he may be attending groups more regularly. This morning he states he is doing fine. He denies SI/HI/AVH and has no concerns at this time.      REVIEW OF SYSTEMS   Review of Systems   Constitutional: Negative for activity change.   HENT: Negative for sore throat.    Eyes: Negative for pain.   Respiratory: Negative for shortness of breath.    Cardiovascular: Negative for chest pain.   Gastrointestinal: Negative for constipation and diarrhea.   Genitourinary: Negative for dysuria.  Musculoskeletal: Negative for myalgias.   Neurological: Negative for dizziness and headaches.   Psychiatric/Behavioral: Negative for agitation.      OBJECTIVE                    Vital Signs:  Current                Vital Signs: 24 Hour Range   BP: 127/77 (05/29 0719)  Temp: 37 ?C (98.6 ?F) (05/29 0719)  Pulse: 66 (05/29 0719)  Respirations: 17 PER MINUTE (05/29 0719)  SpO2: 98 % (05/29 0719) BP: (127-128)/(69-77)   Temp:  [37 ?C (98.6 ?F)-37.2 ?C (99 ?F)]   Pulse:  [66-99]   Respirations:  [16 PER MINUTE-17 PER MINUTE]   SpO2:  [96 %-98 %]    Intensity Pain Scale (Self Report): (not recorded)      Sleep:  Hours of Sleep: 7   Quality of Sleep: Resting Quietly    Scheduled Medications:  divalproex (DEPAKOTE ER) ER tablet 2,500 mg, 2,500 mg, Oral, QHS  nicotine (NICODERM CQ STEP 1) 21 mg/day patch 1 patch, 1 patch, Transdermal, QDAY    PRN Medications:  acetaminophen Q6H PRN, calcium carbonate TID PRN, nicotine polacrilex Q2H PRN 4 mg at 03/27/21 1319, OLANZapine Q6H PRN **OR** OLANZapine Q6H PRN, polyethylene glycol 3350 QDAY PRN, traZODone QHS PRN 50 mg at 03/27/21 2137    Mental Status Exam:  ? General/Constitutional: 25 y.o. adult, appears stated age, dressed  ? in hospital attire, fair hygiene   ? Behavior: calm, cooperative, mostly pleasant  ? Speech/Motor: rrr with fair articulation  ? Eye Contact: fair  ? Mood: fine  ? Affect: guarded, blunted  ? Thought Process: linear and goal directed  ? Associations: Intact  ? Thought Content: Denies SI/HI. Shows no evidence of delusions or paranoia   ? Perception: denies AVH. Does not appear to be responding to internal stimuli   ? Insight/Judgment: poor/poor  ? Orientation: Alert and awake  ? Recent and Remote Memory: grossly intact though not formally assessed  ? Attention span and concentration: poor overall but appropriate for conversation  ? Language: fluent  ? Fund of knowledge and vocabulary: average    Focused Physical Exam:  ? Musculoskeletal: gait without ataxia, normal arm sway   ? Neurological: no tic or tremor    ______________________________________________________________  Leticia Clas, DO

## 2021-03-28 NOTE — Progress Notes
Chart check completed

## 2021-03-28 NOTE — Care Plan
The pt was out in the milieu most of the evening. The pt stated over and over that he was ready to leave [facility] but is waiting on a placement. The pt stated he went to some groups today. The pt denies any SI or HI. The pt stated his appetite has been good.The pt denies any AH or VH. The pt rated his anxiety a 2 and depression was zero.  Problem: Health Maintenance - Impaired  Goal: Able to perfom ADL's  Outcome: Goal Ongoing  Goal: Adequate nutritional intake  Outcome: Goal Ongoing     Problem: Mood - Altered  Goal: Stabilize mood  Outcome: Goal Ongoing     Problem: Thought Process - Altered  Goal: Demonstration of organized thought processes  Outcome: Goal Ongoing     Problem: Violence, self/other-directed, Risk of  Goal: Absence of violence  Outcome: Goal Ongoing     Problem: Cognitive-Perceptual Pattern - Impaired  Goal: Demonstration of organized thought processes  Outcome: Goal Ongoing

## 2021-03-29 NOTE — Progress Notes
Patient is visible in milieu having appropriate interactions with staff and peers. Compliant with medications. Denies si/hi and ah/vh. Attended groups and no behavioral concern. Currently sleeping in room.

## 2021-03-29 NOTE — Care Plan
Problem: Health Maintenance - Impaired  Goal: Able to perfom ADL's  Outcome: Goal Ongoing  Goal: Adequate nutritional intake  Outcome: Goal Ongoing  Goal: Establish therapeutic relationships  Outcome: Goal Ongoing  Goal: Knowledge of health maintenance  Outcome: Goal Ongoing     Problem: Mood - Altered  Goal: Stabilize mood  Outcome: Goal Ongoing  Goal: Knowledge of Altered Mood  Outcome: Goal Ongoing     Problem: Self-esteem - Low  Goal: Demonstration of positive self-esteem  Outcome: Goal Ongoing  Goal: Knowledge of low self-esteem  Outcome: Goal Ongoing     Problem: Thought Process - Altered  Goal: Demonstration of organized thought processes  Outcome: Goal Ongoing  Goal: Knowledge of altered thought process  Outcome: Goal Ongoing     Problem: Violence, self/other-directed, Risk of  Goal: Absence of violence  Outcome: Goal Ongoing  Goal: Knowledge of risk for violence, self/other-directed  Outcome: Goal Ongoing     Problem: Transition Readiness  Goal: Knowledge of transition readiness  Outcome: Goal Ongoing     Problem: Cognitive-Perceptual Pattern - Impaired  Goal: Demonstration of organized thought processes  Outcome: Goal Ongoing  Goal: Knowledge of prescribed medication  Outcome: Goal Ongoing     Problem: Coping - Ineffective, Family  Goal: Effective Coping  Outcome: Goal Ongoing  Goal: Communicate with support staff  Outcome: Goal Ongoing  Goal: Knowledge of positive coping methods  Outcome: Goal Ongoing     Problem: Social Interaction - Impaired  Goal: Increased Social Interaction  Outcome: Goal Ongoing  Goal: Knowledge of social interaction  Outcome: Goal Ongoing

## 2021-03-29 NOTE — Progress Notes
PSYCHIATRIC PROGRESS NOTE     LOS: 0 days  Voluntary/Involuntary Status: Voluntary  Guardian: Parents ([name] [employer], Kent Boldon)     MEDICATIONS     Current Psychotropic Medications:   1. [name] IM every 28 days (last injection 03/23/2021)  2. Depakote ER 2500 mg QHS     ASSESSMENT & DIAGNOSES     Jamie [name] is a 25 y.o. Caucasian adult with a history of schizoaffective disorder and polysubstance abuse who presented to [facility] as a transfer from OSH with manic symptoms in context of methamphetamine use. This appears to be precipitated by methamphetamine use. Factors that seem to have predisposed him to mania include schizoaffective disorder, substance abuse and uncle with schizophrenia. This current problem is maintained by ongoing substance abuse, lack of consistent medication compliance, and poor insight. However, protective factors include good support from parents who are guardians, motivation to seek treatment, and fair insight into current situation. Proposed treatment will consist of substance detoxification and medication management.    DSM-5 DIAGNOSES:  1. Stimulant use disorder, methamphetamine type, moderate  2. Methamphetamine withdrawal  3. Schizoaffective disorder, bipolar type  4. Tobacco use disorder (chewing tobacco), severe  5. Stimulant use disorder, cocaine type, unspecified severity     PLAN     ? No indications for medication changes on 03/29/2021. Continue care as per primary team outlined below:    ? Received Invega Sustenna 156 mg IM second dose of initiation on 03/23/2021  ? Continue Depakote ER to PTA dose of 2500 mg QHS for schizoaffective disorder and mood stabilization  ? Pending level 1 CARE assessment      Metabolic labs completed on:  Lipid Panel:   LDL   Date Value Ref Range Status   03/19/2021 106 (H) <100 mg/dL Final     HDL   Date Value Ref Range Status   03/19/2021 55 >40 MG/DL Final     VLDL   Date Value Ref Range Status   03/19/2021 15 MG/DL Final Cholesterol   Date Value Ref Range Status   03/19/2021 174 <200 MG/DL Final     Triglycerides   Date Value Ref Range Status   03/19/2021 76 <150 MG/DL Final     Blood Glucose:  Hemoglobin A1C   Date Value Ref Range Status   03/19/2021 5.3 4.0 - 6.0 % Final     Comment:     The ADA recommends that most patients with type 1 and type 2 diabetes maintain   an A1c level <7%.       Disposition: Patient has legal guardian. He is unable to leave AMA unless approved by guardian. Pending evaluation for placement.  ?  Seen and discussed with Dr. Georjean Mode  ______________________________________________________________     SUBJECTIVE     No PRN usage in past 24 hours with exception of trazodone for sleep. Per chart review patient slept 6.75 hours and ate 100% of his meals.     Patient seen for follow-up today laying in bed. Reports mood is better than it's been recently. He continues to endorse chronic visual hallucinations, but denies SI/HI/AH. He denies any side effects from his medications, and denies any physical symptoms. He reports interest in talking with SW about possible location for placement. Patient asked about possible discharge date, and was understanding that this could be discussed further with primary team.      REVIEW OF SYSTEMS   Review of Systems   Constitutional: Negative for activity change.   HENT:  Negative for sore throat.    Eyes: Negative for pain.   Respiratory: Negative for shortness of breath.    Cardiovascular: Negative for chest pain.   Gastrointestinal: Negative for constipation and diarrhea.   Genitourinary: Negative for dysuria.   Musculoskeletal: Negative for myalgias.   Neurological: Negative for dizziness and headaches.   Psychiatric/Behavioral: Negative for agitation.      OBJECTIVE                    Vital Signs:  Current                Vital Signs: 24 Hour Range   BP: 105/64 (05/30 0700)  Temp: 36.5 ?C (97.7 ?F) (05/30 0700)  Pulse: 63 (05/30 0700)  Respirations: 18 PER MINUTE (05/30 0700)  SpO2: 95 % (05/30 0700) BP: (105-130)/(64-75)   Temp:  [36.5 ?C (97.7 ?F)-37.1 ?C (98.8 ?F)]   Pulse:  [63-93]   Respirations:  [18 PER MINUTE]   SpO2:  [95 %]    Intensity Pain Scale (Self Report): (not recorded)      Sleep:  Hours of Sleep: 6.75   Quality of Sleep: Resting Quietly    Scheduled Medications:  divalproex (DEPAKOTE ER) ER tablet 2,500 mg, 2,500 mg, Oral, QHS  nicotine (NICODERM CQ STEP 1) 21 mg/day patch 1 patch, 1 patch, Transdermal, QDAY    PRN Medications:  acetaminophen Q6H PRN, calcium carbonate TID PRN, nicotine polacrilex Q2H PRN 4 mg at 03/28/21 1216, OLANZapine Q6H PRN **OR** OLANZapine Q6H PRN, polyethylene glycol 3350 QDAY PRN, traZODone QHS PRN 50 mg at 03/28/21 2153    Mental Status Exam:  ? General/Constitutional: 25 y.o. adult, appears stated age, dressed  ? in hospital attire, fair hygiene   ? Behavior: calm, cooperative, mostly pleasant  ? Speech/Motor: rrr with fair articulation  ? Eye Contact: fair  ? Mood: Better  ? Affect: appropriate, guarded  ? Thought Process: linear and goal directed  ? Associations: Intact  ? Thought Content: Denies SI/HI. Shows no evidence of delusions or paranoia   ? Perception: denies AVH. Does not appear to be responding to internal stimuli   ? Insight/Judgment: poor/poor  ? Orientation: Alert and awake  ? Recent and Remote Memory: grossly intact though not formally assessed  ? Attention span and concentration: poor overall but appropriate for conversation  ? Language: fluent  ? Fund of knowledge and vocabulary: average    Focused Physical Exam:  ? Musculoskeletal: gait without ataxia, normal arm sway   ? Neurological: no tic or tremor    ______________________________________________________________  Leretha Dykes, MD

## 2021-03-29 NOTE — Progress Notes
Chart check complete

## 2021-03-30 NOTE — Care Coordination-Inpatient
Care Coordination - Inpatient Note      Patient Name:   Jamie [name]                        MRN:  [id number]  Admission Date:  03/16/2021    1240: Shirlean Schlein at [facility] to inquire about referral process for pt. Left a voicemail.     Erik Obey, LMSW  03/30/2021

## 2021-03-30 NOTE — Care Plan
Problem: Health Maintenance - Impaired  Goal: Adequate nutritional intake  Outcome: Goal Ongoing    The pt was out in the milieu all evening. The pt was observed interacting with several peers in an appropriate manner.  The pt did not make much eye contact with this writer during his assessment. The pt also verbalized frustration about being here and waiting for placement.  The pt denies any SI or HI thoughts, the pt also denies any AH but endorses VH as seen as floater things with his vision. The pt rates both anxiety and depression a zero. The pt requested something fr sleep. The pt received Trazodone 50 mg po at 2057. The pt is currently resting quietly.  Problem: Mood - Altered  Goal: Stabilize mood  Outcome: Goal Ongoing     Problem: Thought Process - Altered  Goal: Demonstration of organized thought processes  Outcome: Goal Ongoing     Problem: Violence, self/other-directed, Risk of  Goal: Absence of violence  Outcome: Goal Ongoing     Problem: Social Interaction - Impaired  Goal: Increased Social Interaction  Outcome: Goal Ongoing

## 2021-03-30 NOTE — Care Coordination-Inpatient
Care Coordination - Inpatient Note      Patient Name:   Jamie [name]                        MRN:  [id number]  Admission Date:  03/16/2021    1316: Christ Cherry returned call. Stated that for referral CM needs to send referral packet to [phone] and then Ms. Valentino Saxon will do an onsite visit after she reviews paperwork and determine next steps from there.     Erik Obey, LMSW  03/30/2021

## 2021-03-30 NOTE — Behavioral Health Treatment Team
TREATMENT TEAM NOTE  Name: Krikor Willet        MRN: [id number]          DOB: 03/22/1996            Age: 25 y.o.  Admission Date: 03/16/2021       LOS: 0 days    Date of Service: 03/30/2021        Attendees:  UR: Ival Bible, LMSW  Nursing: Freddie Apley, RN, BSN  Therapy: Marijean Heath, LMLP, LCP  CM: Eden Lathe, LPC  Rec Therapy: Clare Charon, CTRS  Music Therapy: Berle Mull, MTBC, LPC  Pharmacy: Hazle Quant, PharmD  Pharmacy: Royetta Crochet, PharmD  Psychiatry: Frankey Poot, APRN-NP  Psychiatry Resident: Orest Dikes, DO  Psychiatry Resident: Maura Crandall, DO      Significant Points Discussed: Visible in the day room, interacting with peers, denies SI,HI,AH, endorses VH, attended some groups,     Treatment Plan Discussion: Monitor and continue treatment.

## 2021-03-30 NOTE — Progress Notes
PSYCHIATRIC PROGRESS NOTE     LOS: 0 days  Voluntary/Involuntary Status: Voluntary  Guardian: Parents (Jamie [name], Jamie [name])     MEDICATIONS     Current Psychotropic Medications:   1. Jamie [name] Sustenna IM every 28 days (last injection 03/23/2021)  2. Depakote ER 2500 mg QHS     ASSESSMENT & DIAGNOSES     Jamie [name] is a 25 y.o. Caucasian adult with a history of schizoaffective disorder and polysubstance abuse who presented to [facility] as a transfer from OSH with manic symptoms in context of methamphetamine use. This appears to be precipitated by methamphetamine use. Factors that seem to have predisposed him to mania include schizoaffective disorder, substance abuse and uncle with schizophrenia. This current problem is maintained by ongoing substance abuse, lack of consistent medication compliance, and poor insight. However, protective factors include good support from parents who are guardians, motivation to seek treatment, and fair insight into current situation. Proposed treatment will consist of substance detoxification and medication management.    DSM-5 DIAGNOSES:  1. Stimulant use disorder, methamphetamine type, moderate  2. Methamphetamine withdrawal  3. Schizoaffective disorder, bipolar type  4. Tobacco use disorder (chewing tobacco), severe  5. Stimulant use disorder, cocaine type, unspecified severity     PLAN     ? No indications for medication changes on 03/30/2021.   ? Received Jamie [name] Sustenna 156 mg IM second dose of initiation on 03/23/2021  ? Continue Depakote ER to PTA dose of 2500 mg QHS for schizoaffective disorder and mood stabilization  ? Pending level 1 CARE assessment      Metabolic labs completed on:  Lipid Panel:   LDL   Date Value Ref Range Status   03/19/2021 106 (H) <100 mg/dL Final     HDL   Date Value Ref Range Status   03/19/2021 55 >40 MG/DL Final     VLDL   Date Value Ref Range Status   03/19/2021 15 MG/DL Final     Cholesterol   Date Value Ref Range Status   03/19/2021 174 <200 MG/DL Final     Triglycerides   Date Value Ref Range Status   03/19/2021 76 <150 MG/DL Final     Blood Glucose:  Hemoglobin A1C   Date Value Ref Range Status   03/19/2021 5.3 4.0 - 6.0 % Final     Comment:     The ADA recommends that most patients with type 1 and type 2 diabetes maintain   an A1c level <7%.       Disposition: Patient has legal guardian. He is unable to leave AMA unless approved by guardian. Pending evaluation for placement.  ?  Seen and discussed with Dr. Erick Colace  ______________________________________________________________     SUBJECTIVE     Mr. Jamie [name] is found sleeping in his hospital bed prior to interview this morning. He is easy to wake with verbal stimuli only. He reports that he is doing pretty good. He denies any problems over the weekend. He continues to sleep good and was documented to have slept for 6.75 hours last night. His appetite is unchanged and he reports that he is eating well. He denies any depressed mood, anxiety, suicidal ideation, homicidal ideation, auditory hallucinations. Patient indicates that he continues to have visual hallucinations that are at baseline. He discussed placement with another patient on the unit and he is interested in a specific placement if appropriate. He plans to discuss with case management. He has not required any agitation PRNs in the last  24 hours.     REVIEW OF SYSTEMS   Review of Systems   Constitutional: Negative for activity change.   HENT: Negative for sore throat.    Eyes: Negative for pain.   Respiratory: Negative for shortness of breath.    Cardiovascular: Negative for chest pain.   Gastrointestinal: Negative for constipation and diarrhea.   Genitourinary: Negative for dysuria.   Musculoskeletal: Negative for myalgias.   Neurological: Negative for dizziness and headaches.   Psychiatric/Behavioral: Negative for agitation.      OBJECTIVE                    Vital Signs:  Current                Vital Signs: 24 Hour Range   BP: 110/56 (05/31 0700)  Temp: 36.7 ?C (98.1 ?F) (05/31 0700)  Pulse: 56 (05/31 0700)  Respirations: 16 PER MINUTE (05/31 0700)  SpO2: 95 % (05/31 0700) BP: (110-121)/(56-72)   Temp:  [36.7 ?C (98.1 ?F)-36.8 ?C (98.2 ?F)]   Pulse:  [56-90]   Respirations:  [16 PER MINUTE-17 PER MINUTE]   SpO2:  [95 %-98 %]    Intensity Pain Scale (Self Report): (not recorded)      Sleep:  Hours of Sleep: 6.75   Quality of Sleep: Resting Quietly    Scheduled Medications:  divalproex (DEPAKOTE ER) ER tablet 2,500 mg, 2,500 mg, Oral, QHS  nicotine (NICODERM CQ STEP 1) 21 mg/day patch 1 patch, 1 patch, Transdermal, QDAY    PRN Medications:  acetaminophen Q6H PRN, calcium carbonate TID PRN, nicotine polacrilex Q2H PRN 4 mg at 03/29/21 2052, OLANZapine Q6H PRN **OR** OLANZapine Q6H PRN, polyethylene glycol 3350 QDAY PRN, traZODone QHS PRN 50 mg at 03/29/21 2057    Mental Status Exam:  ? General/Constitutional: 25 y.o. adult, appears stated age, dressed in hospital attire, fair hygiene   ? Behavior: calm, cooperative, mostly pleasant  ? Speech/Motor: rrr with fair articulation  ? Eye Contact: fair  ? Mood: pretty good  ? Affect: appropriate, guarded  ? Thought Process: linear and goal directed  ? Associations: Intact  ? Thought Content: Denies SI/HI. Shows no evidence of delusions or paranoia   ? Perception: denies AVH. Does not appear to be responding to internal stimuli   ? Insight/Judgment: poor/poor  ? Orientation: Alert and awake  ? Recent and Remote Memory: grossly intact though not formally assessed  ? Attention span and concentration: poor overall but appropriate for conversation  ? Language: fluent  ? Fund of knowledge and vocabulary: average    Focused Physical Exam:  ? Musculoskeletal: gait without ataxia, normal arm sway   ? Neurological: no tic or tremor    ______________________________________________________________  Orest Dikes, DO

## 2021-03-30 NOTE — Care Coordination-Inpatient
Care Coordination - Inpatient Note      Patient Name:   Jamie [name]                        MRN:  [id number]  Admission Date:  03/16/2021    1443: Referral sent to Cindi Carbon at [facility] via fax.     Erik Obey, LMSW  03/30/2021

## 2021-03-30 NOTE — Progress Notes
Chart check completed

## 2021-03-31 NOTE — Care Plan
Pt visible and appropriate in the day area most of this evening. He denied current SI/HI and stated "I'm just still pissed about being here but there is nothing I can do." Pt rated anxiety and depression both as low. He complied with scheduled medications and prn Trazodone and nicotine gum. Will continue to monitor.     Problem: Health Maintenance - Impaired  Goal: Adequate nutritional intake  Outcome: Goal Ongoing  Goal: Establish therapeutic relationships  Outcome: Goal Ongoing     Problem: Cognitive-Perceptual Pattern - Impaired  Goal: Knowledge of prescribed medication  Outcome: Goal Ongoing     Problem: Coping - Ineffective, Family  Goal: Effective Coping  Outcome: Goal Ongoing  Goal: Communicate with support staff  Outcome: Goal Ongoing

## 2021-03-31 NOTE — Progress Notes
[facility]  Therapy Progress Note    NAME:Jamie [name] MRN: [id number] DOB:[birth year] AGE: 25 y.o.  ADMISSION DATE: 03/16/2021 DAYS ADMITTED: LOS: 0 days    Date of Service:  03/31/21    Active Problems:    Schizoaffective disorder, bipolar type (HCC)    Tobacco use disorder, severe, dependence    Withdrawal from methamphetamine [facility])    Methamphetamine use disorder, moderate (HCC)    Objective:  Therapist approached Patient's room and he appeared to be asleep.  Treatment Team will continue to follow.

## 2021-03-31 NOTE — Behavioral Health Treatment Team
TREATMENT TEAM NOTE  Name: Jamie [name]        MRN: [id number]          DOB: 1996-07-18            Age: 25 y.o.  Admission Date: 03/16/2021       LOS: 0 days    Date of Service: 03/31/2021        Attendees:  UR: Ival Bible, LMSW  Nursing: Freddie Apley, RN, BSN  Therapy: Clement Husbands, Bernice, [facility]  Therapy: Marijean Heath, LMLP, LCP  CM: Henderson Cloud, LMSW  CM: Neysa Bonito, LPC  CM: Eden Lathe, LPC  Rec Therapy: Clare Charon, CTRS  Music Therapy: Berle Mull, MTBC, LPC  Pharmacy: Royetta Crochet, PharmD  Psychology: Peter Minium, PhD  Psychiatry: Frankey Poot, APRN-NP  Psychiatry: Cristine Polio, DO   Psychiatry Resident: Orest Dikes, DO  Psychiatry Resident: Milana Kidney, DO  Psychiatry Resident: Maura Crandall, DO      Significant Points Discussed:   Frustrated about being here  Minimal engagement in groups    Treatment Plan Discussion: continue to monitor, waiting for placement

## 2021-03-31 NOTE — Progress Notes
PRN Medication Administered:  Trazodone  Non pharmacological interventions attempted prior to PRN medication administration:  Decrease stimuli    Brief narrative of reason for PRN medication administration: Pt requested as a sleep aid.

## 2021-03-31 NOTE — Progress Notes
PSYCHIATRIC PROGRESS NOTE     LOS: 0 days  Voluntary/Involuntary Status: Voluntary  Guardian: Parents (Jamie [name], Jamie [name])     MEDICATIONS     Current Psychotropic Medications:   1. [name] IM every 28 days (last injection 03/23/2021)  2. Depakote ER 2500 mg QHS     ASSESSMENT & DIAGNOSES     Jamie [name] is a 25 y.o. Caucasian adult with a history of schizoaffective disorder and polysubstance abuse who presented to [facility] as a transfer from OSH with manic symptoms in context of methamphetamine use. This appears to be precipitated by methamphetamine use. Factors that seem to have predisposed him to mania include schizoaffective disorder, substance abuse and uncle with schizophrenia. This current problem is maintained by ongoing substance abuse, lack of consistent medication compliance, and poor insight. However, protective factors include good support from parents who are guardians, motivation to seek treatment, and fair insight into current situation. Proposed treatment will consist of substance detoxification and medication management.    DSM-5 DIAGNOSES:  1. Stimulant use disorder, methamphetamine type, moderate  2. Methamphetamine withdrawal  3. Schizoaffective disorder, bipolar type  4. Tobacco use disorder (chewing tobacco), severe  5. Stimulant use disorder, cocaine type, unspecified severity     PLAN     ? No indications for medication changes on 03/31/2021.   ? Received [name] 156 mg IM second dose of initiation on 03/23/2021  ? Continue Depakote ER to PTA dose of 2500 mg QHS for schizoaffective disorder and mood stabilization  ? Pending level 1 CARE assessment      Metabolic labs completed on:  Lipid Panel:   LDL   Date Value Ref Range Status   03/19/2021 106 (H) <100 mg/dL Final     HDL   Date Value Ref Range Status   03/19/2021 55 >40 MG/DL Final     VLDL   Date Value Ref Range Status   03/19/2021 15 MG/DL Final     Cholesterol   Date Value Ref Range Status   03/19/2021 174 <200 MG/DL Final     Triglycerides   Date Value Ref Range Status   03/19/2021 76 <150 MG/DL Final     Blood Glucose:  Hemoglobin A1C   Date Value Ref Range Status   03/19/2021 5.3 4.0 - 6.0 % Final     Comment:     The ADA recommends that most patients with type 1 and type 2 diabetes maintain   an A1c level <7%.       Disposition: Patient has legal guardian. He is unable to leave AMA unless approved by guardian. Pending evaluation for placement.  ?  Seen and discussed with Dr. Erick Colace  ______________________________________________________________     SUBJECTIVE     Jamie [name] is found eating breakfast in the group room. He is irritable regarding continued admission and pending placement. He continues to sleep good and was documented to have slept for 6.5 hours last night. His appetite is unchanged and he reports that he is eating well. He denies any depressed mood, anxiety, suicidal ideation, homicidal ideation, auditory hallucinations. Patient indicates that he continues to have visual hallucinations that are at baseline. Patient discussed placement options with his father and his father is concerned regarding placement in [place] and would prefer placement in [place]. He has not required any agitation PRNs in the last 24 hours.     REVIEW OF SYSTEMS   Review of Systems   Constitutional: Negative for activity change.  HENT: Negative for sore throat.    Eyes: Negative for pain.   Respiratory: Negative for shortness of breath.    Cardiovascular: Negative for chest pain.   Gastrointestinal: Negative for constipation and diarrhea.   Genitourinary: Negative for dysuria.   Musculoskeletal: Negative for myalgias.   Neurological: Negative for dizziness and headaches.   Psychiatric/Behavioral: Negative for agitation.      OBJECTIVE                    Vital Signs:  Current                Vital Signs: 24 Hour Range   BP: 112/58 (06/01 0700)  Temp: 36.7 ?C (98.1 ?F) (06/01 0700)  Pulse: 55 (06/01 0700)  Respirations: 18 PER MINUTE (06/01 0700)  SpO2: 97 % (06/01 0700) BP: (112-152)/(58-85)   Temp:  [36.3 ?C (97.3 ?F)-36.7 ?C (98.1 ?F)]   Pulse:  [55-101]   Respirations:  [16 PER MINUTE-18 PER MINUTE]   SpO2:  [97 %-98 %]    Intensity Pain Scale (Self Report): (not recorded)      Sleep:  Hours of Sleep: 6.5   Quality of Sleep: Resting Quietly    Scheduled Medications:  divalproex (DEPAKOTE ER) ER tablet 2,500 mg, 2,500 mg, Oral, QHS  nicotine (NICODERM CQ STEP 1) 21 mg/day patch 1 patch, 1 patch, Transdermal, QDAY    PRN Medications:  acetaminophen Q6H PRN, calcium carbonate TID PRN, nicotine polacrilex Q2H PRN 4 mg at 03/30/21 2047, OLANZapine Q6H PRN **OR** OLANZapine Q6H PRN, polyethylene glycol 3350 QDAY PRN, traZODone QHS PRN 50 mg at 03/30/21 2044    Mental Status Exam:  ? General/Constitutional: 25 y.o. adult, appears stated age, dressed in hospital attire, fair hygiene   ? Behavior: calm, cooperative, mostly pleasant  ? Speech/Motor: rrr with fair articulation  ? Eye Contact: fair  ? Mood: good  ? Affect: appropriate, guarded  ? Thought Process: linear and goal directed  ? Associations: Intact  ? Thought Content: Denies SI/HI. Shows no evidence of delusions or paranoia   ? Perception: denies AVH. Does not appear to be responding to internal stimuli   ? Insight/Judgment: poor/poor  ? Orientation: Alert and awake  ? Recent and Remote Memory: grossly intact though not formally assessed  ? Attention span and concentration: poor overall but appropriate for conversation  ? Language: fluent  ? Fund of knowledge and vocabulary: average    Focused Physical Exam:  ? Musculoskeletal: gait without ataxia, normal arm sway   ? Neurological: no tic or tremor    ______________________________________________________________  Jamie Dikes, DO

## 2021-03-31 NOTE — Progress Notes
Chart check completed.

## 2021-04-01 NOTE — Progress Notes
Pt in room at time of assessment.  Pt denies SI/HI/AH/VH at time of assessment.  Pt denies anxiety/depression/pain at time of assessment.  Pt reports that his last BM was 03/31/21.  Pt in milieu for am meal and some groups.

## 2021-04-01 NOTE — Progress Notes
Chart check completed

## 2021-04-01 NOTE — Progress Notes
Chart check completed.

## 2021-04-01 NOTE — Progress Notes
PSYCHIATRIC PROGRESS NOTE     LOS: 0 days  Voluntary/Involuntary Status: Voluntary  Guardian: Parents (Jamie [name], Jamie [name])     MEDICATIONS     Current Psychotropic Medications:   1. Jamie [name] IM every 28 days (last injection 03/23/2021)  2. Jamie [name] 2500 mg QHS     ASSESSMENT & DIAGNOSES     Jamie [name] is a 25 y.o. Caucasian adult with a history of schizoaffective disorder and polysubstance abuse who presented to [facility] as a transfer from OSH with manic symptoms in context of methamphetamine use. This appears to be precipitated by methamphetamine use. Factors that seem to have predisposed him to mania include schizoaffective disorder, substance abuse and uncle with schizophrenia. This current problem is maintained by ongoing substance abuse, lack of consistent medication compliance, and poor insight. However, protective factors include good support from parents who are guardians, motivation to seek treatment, and fair insight into current situation. Proposed treatment will consist of substance detoxification and medication management.    DSM-5 DIAGNOSES:  1. Stimulant use disorder, methamphetamine type, moderate  2. Methamphetamine withdrawal  3. Schizoaffective disorder, bipolar type  4. Tobacco use disorder (chewing tobacco), severe  5. Stimulant use disorder, cocaine type, unspecified severity     PLAN     ? No indications for medication changes on 04/01/2021.   ? Received Jamie [name] 156 mg IM second dose of initiation on 03/23/2021; next dose: 04/20/2021  ? Continue Jamie [name] to PTA dose of 2500 mg QHS for schizoaffective disorder and mood stabilization  ? Pending level 1 CARE assessment      Metabolic labs completed on:  Lipid Panel:   LDL   Date Value Ref Range Status   03/19/2021 106 (H) <100 mg/dL Final     HDL   Date Value Ref Range Status   03/19/2021 55 >40 MG/DL Final     VLDL   Date Value Ref Range Status   03/19/2021 15 MG/DL Final     Cholesterol   Date Value Ref Range Status   03/19/2021 174 <200 MG/DL Final     Triglycerides   Date Value Ref Range Status   03/19/2021 76 <150 MG/DL Final     Blood Glucose:  Hemoglobin A1C   Date Value Ref Range Status   03/19/2021 5.3 4.0 - 6.0 % Final     Comment:     The ADA recommends that most patients with type 1 and type 2 diabetes maintain   an A1c level <7%.       Disposition: Patient has legal guardian. He is unable to leave AMA unless approved by guardian. Pending evaluation for placement.  ?  Seen and discussed with Dr. Erick [name]  ______________________________________________________________     SUBJECTIVE     Jamie [name] is found sleeping in his hospital bed prior to interview this morning. He is easy to wake with verbal stimuli only. Patient reports that she slept well last night after he utilized trazodone PRN and feels that this is adequate for insomnia at this time. Per nursing documentation, he slept for 6.75 hours last night. He continues to eat his meals without difficulty and denies any changes in his appetite. He denies any suicidal ideation, homicidal ideation, auditory/visual hallucinations. He continues to wait for placement. Patient attended one group session yesterday for music therapy. Encouraged the patient to participate more on the psychiatric milieu.      REVIEW OF SYSTEMS   Review of Systems   Constitutional: Negative for  activity change.   HENT: Negative for sore throat.    Eyes: Negative for pain.   Respiratory: Negative for shortness of breath.    Cardiovascular: Negative for chest pain.   Gastrointestinal: Negative for constipation and diarrhea.   Genitourinary: Negative for dysuria.   Musculoskeletal: Negative for myalgias.   Neurological: Negative for dizziness and headaches.   Psychiatric/Behavioral: Negative for agitation.      OBJECTIVE                    Vital Signs:  Current                Vital Signs: 24 Hour Range   BP: 123/50 (06/02 0700)  Temp: 36.3 ?C (97.4 ?F) (06/02 0700)  Pulse: 53 (06/02 0700)  Respirations: 16 PER MINUTE (06/02 0700)  SpO2: 97 % (06/02 0700) BP: (122-123)/(50-72)   Temp:  [36.3 ?C (97.4 ?F)-36.8 ?C (98.2 ?F)]   Pulse:  [53-89]   Respirations:  [16 PER MINUTE]   SpO2:  [97 %]    Intensity Pain Scale (Self Report): (not recorded)      Sleep:  Hours of Sleep: 6.75   Quality of Sleep: Resting Quietly    Scheduled Medications:  divalproex (Jamie [name]) [name] tablet 2,500 mg, 2,500 mg, Oral, QHS  nicotine (NICODERM CQ STEP 1) 21 mg/day patch 1 patch, 1 patch, Transdermal, QDAY    PRN Medications:  acetaminophen Q6H PRN, calcium carbonate TID PRN, nicotine polacrilex Q2H PRN 4 mg at 04/01/21 1112, OLANZapine Q6H PRN **OR** OLANZapine Q6H PRN, polyethylene glycol 3350 QDAY PRN, traZODone QHS PRN 50 mg at 03/31/21 2131    Mental Status Exam:  ? General/Constitutional: 26 y.o. adult, appears stated age, dressed in hospital attire, fair hygiene   ? Behavior: calm, cooperative, mostly pleasant  ? Speech/Motor: rrr with fair articulation  ? Eye Contact: fair  ? Mood: pretty good  ? Affect: appropriate, guarded  ? Thought Process: linear and goal directed  ? Associations: Intact  ? Thought Content: Denies SI/HI. Shows no evidence of delusions or paranoia   ? Perception: denies AVH. Does not appear to be responding to internal stimuli   ? Insight/Judgment: poor/poor  ? Orientation: Alert and awake  ? Recent and Remote Memory: grossly intact though not formally assessed  ? Attention span and concentration: poor overall but appropriate for conversation  ? Language: fluent  ? Fund of knowledge and vocabulary: average    Focused Physical Exam:  ? Musculoskeletal: gait without ataxia, normal arm sway   ? Neurological: no tic or tremor    ______________________________________________________________  Jamie Dikes, DO

## 2021-04-01 NOTE — Care Plan
Problem: Health Maintenance - Impaired  Goal: Able to perfom ADL's  Outcome: Goal Ongoing  Goal: Adequate nutritional intake  Outcome: Goal Ongoing  Goal: Establish therapeutic relationships  Outcome: Goal Ongoing  Goal: Knowledge of health maintenance  Outcome: Goal Ongoing     Problem: Mood - Altered  Goal: Stabilize mood  Outcome: Goal Ongoing  Goal: Knowledge of Altered Mood  Outcome: Goal Ongoing     Problem: Self-esteem - Low  Goal: Demonstration of positive self-esteem  Outcome: Goal Ongoing  Goal: Knowledge of low self-esteem  Outcome: Goal Ongoing     Problem: Thought Process - Altered  Goal: Demonstration of organized thought processes  Outcome: Goal Ongoing  Goal: Knowledge of altered thought process  Outcome: Goal Ongoing     Problem: Violence, self/other-directed, Risk of  Goal: Absence of violence  Outcome: Goal Ongoing  Goal: Knowledge of risk for violence, self/other-directed  Outcome: Goal Ongoing     Problem: Transition Readiness  Goal: Knowledge of transition readiness  Outcome: Goal Ongoing     Problem: Cognitive-Perceptual Pattern - Impaired  Goal: Demonstration of organized thought processes  Outcome: Goal Ongoing  Goal: Knowledge of prescribed medication  Outcome: Goal Ongoing     Problem: Coping - Ineffective, Family  Goal: Effective Coping  Outcome: Goal Ongoing  Goal: Communicate with support staff  Outcome: Goal Ongoing  Goal: Knowledge of positive coping methods  Outcome: Goal Ongoing     Problem: Social Interaction - Impaired  Goal: Increased Social Interaction  Outcome: Goal Ongoing  Goal: Knowledge of social interaction  Outcome: Goal Ongoing

## 2021-04-01 NOTE — Care Coordination-Inpatient
Care Coordination - Inpatient Note      Patient Name:   Jamie [name]                        MRN:  [id number]  Admission Date:  03/16/2021    1:54 pm  CM sent email to Carvel Getting, Level 1 Assessor in [facility] ([email or web address]), with Pt's face sheet and asked when Annice Pih would like to schedule Pt's assessment. CM awaiting response.     Jariel [employer]  04/01/2021

## 2021-04-01 NOTE — Progress Notes
[facility]  Therapy Progress Note    NAME:Jamie [name] MRN: [id number] DOB:[birth year] AGE: 25 y.o.  ADMISSION DATE: 03/16/2021 DAYS ADMITTED: LOS: 0 days    Date of Service:  04/01/21    Active Problems:    Schizoaffective disorder, bipolar type (HCC)    Tobacco use disorder, severe, dependence    Withdrawal from methamphetamine [facility])    Methamphetamine use disorder, moderate (HCC)    Objective:  Therapist approached Patient in his room and he agreed to a session; it took place in the large conference room.  Eye contact predominantly WNL.  Affect full; mood congruent.  Patient was calm and appropriate.    Narrative:  Patient talked about his concerns related to placement.  He emphasized his interest in working and initially said that he lost his vehicle because of his time at [facility].  As the session progressed, Patient admitted that his actions led to this hospitalization and that he should not externalize any blame.    Patient also initially expressed frustration/anger at his parents.  He later acknowledged that his use of methamphetamines and his history of impulsivity were natural concerns for his parents.       Follow up:  Therapist listened to and validated Patient's concerns.  His strengths were highlighted and he was encouraged to conceptualize placement as an opportunity to gain greater skills in the area of consistency.  Establishing and working towards realistic goals were also discussed.  Possible benefits of placement were identified.    Treatment Team will continue to follow.

## 2021-04-01 NOTE — Care Plan
The pt was out in the milieu all evening. The pt interacted with peers on a limited basis. The pt was smiling quite often throughout  the evening shift.The pt denies any SI or HI he pt also denies any AH but endorses VH in the form of floaters. The pt rated both his anxiety and depression a zero.The pt asked for something to help him sleep. The pt received Trazodone 50 po at 2131. The pt is currently resting quietly.  Problem: Health Maintenance - Impaired  Goal: Able to perfom ADL's  Outcome: Goal Ongoing  Goal: Adequate nutritional intake  Outcome: Goal Ongoing     Problem: Mood - Altered  Goal: Stabilize mood  Outcome: Goal Ongoing     Problem: Violence, self/other-directed, Risk of  Goal: Absence of violence  Outcome: Goal Ongoing

## 2021-04-01 NOTE — Behavioral Health Treatment Team
TREATMENT TEAM NOTE  Name: Jamie [name]        MRN: [id number]          DOB: [birth year]            Age: 25 y.o.  Admission Date: 03/16/2021       LOS: 0 days    Date of Service: 04/01/2021        Attendees:  UR: Ival Bible, LMSW  Nursing: Freddie Apley, RN, BSN  Therapy: Clement Husbands, [place], [facility]  Therapy: Marijean Heath, LMLP, LCP  CM: Neysa Bonito, [facility]  Rec Therapy: Clare Charon, CTRS  Pharmacy: Royetta Crochet, PharmD  Psychology: Peter Minium, PhD  Psychology: Madie Reno, intern  Psychiatry: Frankey Poot, APRN-NP  Psychiatry Resident: Orest Dikes, DO  Psychiatry Resident: Maura Crandall, DO      Significant Points Discussed: Endorses VH, limited interactions, attended music therapy. Waiting for placement.    Treatment Plan Discussion: Continue treatment

## 2021-04-02 NOTE — Progress Notes
Case Management Progress Note    NAME:Jamie [name] MRN: [id number] DOB:11/15/1995 AGE: 25 y.o.  ADMISSION DATE: 03/16/2021 DAYS ADMITTED: LOS: 0 days     Date of Service: 04/02/2021    12:06 pm  CM attempted to check in with Pt to discuss placement process and any case management needs. Pt was asleep and did not awake to verbal prompts.

## 2021-04-02 NOTE — Progress Notes
Chart check completed.

## 2021-04-02 NOTE — Progress Notes
PSYCHIATRIC PROGRESS NOTE     LOS: 0 days  Voluntary/Involuntary Status: Voluntary  Guardian: Parents ([name] Newton, Kent Tatem)     MEDICATIONS     Current Psychotropic Medications:   1. [name] IM every 28 days (last injection 03/23/2021)  2. Depakote ER 2500 mg QHS     ASSESSMENT & DIAGNOSES     Jamie [name] is a 25 y.o. Caucasian adult with a history of schizoaffective disorder and polysubstance abuse who presented to [facility] as a transfer from OSH with manic symptoms in context of methamphetamine use. This appears to be precipitated by methamphetamine use. Factors that seem to have predisposed him to mania include schizoaffective disorder, substance abuse and uncle with schizophrenia. This current problem is maintained by ongoing substance abuse, lack of consistent medication compliance, and poor insight. However, protective factors include good support from parents who are guardians, motivation to seek treatment, and fair insight into current situation. Proposed treatment will consist of substance detoxification and medication management.    DSM-5 DIAGNOSES:  1. Stimulant use disorder, methamphetamine type, moderate  2. Methamphetamine withdrawal  3. Schizoaffective disorder, bipolar type  4. Tobacco use disorder (chewing tobacco), severe  5. Stimulant use disorder, cocaine type, unspecified severity     PLAN     ? No indications for medication changes on 04/02/2021.   ? Received [name] 156 mg IM second dose of initiation on 03/23/2021; next dose: 04/20/2021  ? Continue Depakote ER to PTA dose of 2500 mg QHS for schizoaffective disorder and mood stabilization  ? Pending level 1 CARE assessment      Metabolic labs completed on:  Lipid Panel:   LDL   Date Value Ref Range Status   03/19/2021 106 (H) <100 mg/dL Final     HDL   Date Value Ref Range Status   03/19/2021 55 >40 MG/DL Final     VLDL   Date Value Ref Range Status   03/19/2021 15 MG/DL Final     Cholesterol   Date Value Ref Range Status   03/19/2021 174 <200 MG/DL Final     Triglycerides   Date Value Ref Range Status   03/19/2021 76 <150 MG/DL Final     Blood Glucose:  Hemoglobin A1C   Date Value Ref Range Status   03/19/2021 5.3 4.0 - 6.0 % Final     Comment:     The ADA recommends that most patients with type 1 and type 2 diabetes maintain   an A1c level <7%.       Disposition: Patient has legal guardian. He is unable to leave AMA unless approved by guardian. Pending evaluation for placement.  ?  Seen and discussed with Dr. Erick Colace  ______________________________________________________________     SUBJECTIVE     Mr. Hast is found eating his breakfast on the unit during interview. He is pleasant and reports that he is feeling a little tired this morning. Mr. Bogle states that he slept well last night and just woke up, which may be contributing to his somnolence. He denies any changes in his appetite. He denies any depressed mood or anxiety. He also denies any thoughts about wanting to harm himself or others. His visual hallucinations remain unchanged. Patient jokes you sound like a broken record commenting about his daily interviews. Then patient states you have a consistent plan everyday.      REVIEW OF SYSTEMS   Review of Systems   Constitutional: Negative for activity change.   HENT: Negative for  sore throat.    Eyes: Negative for pain.   Respiratory: Negative for shortness of breath.    Cardiovascular: Negative for chest pain.   Gastrointestinal: Negative for constipation and diarrhea.   Genitourinary: Negative for dysuria.   Musculoskeletal: Negative for myalgias.   Neurological: Negative for dizziness and headaches.   Psychiatric/Behavioral: Negative for agitation.      OBJECTIVE                    Vital Signs:  Current                Vital Signs: 24 Hour Range   BP: 105/60 (06/03 0700)  Temp: 36.6 ?C (97.9 ?F) (06/03 0700)  Pulse: 72 (06/03 0700)  Respirations: 16 PER MINUTE (06/03 0700)  SpO2: 95 % (06/03 0700) BP: (105-132)/(60-70)   Temp:  [36.6 ?C (97.9 ?F)-37.1 ?C (98.8 ?F)]   Pulse:  [72-89]   Respirations:  [16 PER MINUTE]   SpO2:  [95 %-97 %]    Intensity Pain Scale (Self Report): (not recorded)      Sleep:  Hours of Sleep: 6.25   Quality of Sleep: Resting Quietly    Scheduled Medications:  divalproex (DEPAKOTE ER) ER tablet 2,500 mg, 2,500 mg, Oral, QHS  nicotine (NICODERM CQ STEP 1) 21 mg/day patch 1 patch, 1 patch, Transdermal, QDAY    PRN Medications:  acetaminophen Q6H PRN, calcium carbonate TID PRN, nicotine polacrilex Q2H PRN 4 mg at 04/01/21 2043, OLANZapine Q6H PRN **OR** OLANZapine Q6H PRN, polyethylene glycol 3350 QDAY PRN, traZODone QHS PRN 50 mg at 04/01/21 2222    Mental Status Exam:  ? General/Constitutional: 25 y.o. adult, appears stated age, dressed in hospital attire, fair hygiene   ? Behavior: calm, cooperative, pleasant  ? Speech/Motor: rrr with fair articulation  ? Eye Contact: good  ? Mood: good  ? Affect: euthymic, congruent with stated mood, appropriate for context of conversation  ? Thought Process: linear and goal directed  ? Associations: Intact  ? Thought Content: Denies SI/HI. Shows no evidence of delusions or paranoia   ? Perception: reports continued VH, denies AH. Does not appear to be responding to internal stimuli   ? Insight/Judgment: fair/fair  ? Orientation: Alert and awake  ? Recent and Remote Memory: grossly intact though not formally assessed  ? Attention span and concentration: intact  ? Language: fluent  ? Fund of knowledge and vocabulary: average    Focused Physical Exam:  ? Musculoskeletal: gait without ataxia  ? Neurological: no tic or tremor, no gross deficits   ______________________________________________________________  Orest Dikes, DO

## 2021-04-02 NOTE — Care Coordination-Inpatient
Care Coordination - Inpatient Note      Patient Name:   Jamie [name]                        MRN:  [id number]  Admission Date:  03/16/2021    2:15 pm  CM called Pt's mother and guardian to provide update and discuss placement process. [name] did not answer, CM left voicemail requesting call back.     Lashaundra [employer]  04/02/2021

## 2021-04-02 NOTE — Behavioral Health Treatment Team
TREATMENT TEAM NOTE  Name: Serafino Burciaga        MRN: [id number]          DOB: Oct 20, 1996            Age: 25 y.o.  Admission Date: 03/16/2021       LOS: 0 days    Date of Service: 04/02/2021        Attendees:  UR: Ival Bible, LMSW  Nursing: Freddie Apley, RN, BSN  NTherapy: Clement Husbands, [place], [facility]  Therapy: Marijean Heath, LMLP, LCP  CM: Henderson Cloud, LMSW  CM: Neysa Bonito, LPC  CM: Eden Lathe, LPC  Music Therapy: Berle Mull, [employer], [facility]  Activities Coordinator: Philippa Chester  Nurse Manager: Ursula Beath, RN  Pharmacy: Royetta Crochet, PharmD  Psychiatry: Frankey Poot, APRN-NP  Psychiatry Resident: Orest Dikes, DO  Psychiatry Resident: Maura Crandall, DO        Significant Points Discussed: Endorses AH/VH. More focused and on topic, easier to have a conversation and express his concerns. Pt has concerns with placement and not wanting to go to level I. Requesting to go back home with parents. Hit or miss in group attendance.     Treatment Plan Discussion: Awaiting placement

## 2021-04-02 NOTE — Care Plan
Pt was seen in the milieu most of the evening. Pt denied depression, anxiety, SI, HI and pain. When asked about AVH pt stated I see red, blue and green dots or stars but that is normal for me. Pt shared he was hospitalized after using drugs for a week. Pt stated he would like his parents to give him another chance to be independent so he can go back to his apartment instead of trying to find placement for him. Pt compliant with prescribed HS medications and received nicotine gum PRN as ordered. Pt will continue to be monitored per protocol. Pt encouraged to come to staff with questions or concerns.    22:22  PRN Medication Administered:  Trazodone 50 mg    Non pharmacological interventions attempted prior to PRN medication administration:  Decrease stimuli and Increase structure and limit setting    Brief narrative of reason for PRN medication administration:   Pt requested and given trazodone for sleep.       Mental Status Exam  Legal Status: Voluntary Admission  General Appearance: Normal  Mood / Affect: Congruent  Speech: Normal  Content Of Thought: Normal  Motor Activity: Normal  Flow of Thought: Circumstantial  Sensorium: Orientation to person  Insight / Judgment: Fair judgment, Fair insight  Behavior: Calm, Cooperative, Follows commands  Patient Strengths: Interpersonal relationships and supports, i.e. family, friends, peers:      Vitals*  Pulse: 89  BP Patient Position: Chair  BP: 132/70  Respirations: 16 PER MINUTE  SpO2: 97 %  SpO2 Location: Finger, Second (Index):       Problem: Health Maintenance - Impaired  Goal: Able to perfom ADL's  Outcome: Goal Ongoing  Goal: Adequate nutritional intake  Outcome: Goal Ongoing  Goal: Establish therapeutic relationships  Outcome: Goal Ongoing  Goal: Knowledge of health maintenance  Outcome: Goal Ongoing     Problem: Mood - Altered  Goal: Stabilize mood  Outcome: Goal Ongoing  Goal: Knowledge of Altered Mood  Outcome: Goal Ongoing     Problem: Self-esteem - Low  Goal: Demonstration of positive self-esteem  Outcome: Goal Ongoing  Goal: Knowledge of low self-esteem  Outcome: Goal Ongoing     Problem: Thought Process - Altered  Goal: Demonstration of organized thought processes  Outcome: Goal Ongoing  Goal: Knowledge of altered thought process  Outcome: Goal Ongoing     Problem: Violence, self/other-directed, Risk of  Goal: Absence of violence  Outcome: Goal Ongoing  Goal: Knowledge of risk for violence, self/other-directed  Outcome: Goal Ongoing     Problem: Transition Readiness  Goal: Knowledge of transition readiness  Outcome: Goal Ongoing     Problem: Cognitive-Perceptual Pattern - Impaired  Goal: Demonstration of organized thought processes  Outcome: Goal Ongoing  Goal: Knowledge of prescribed medication  Outcome: Goal Ongoing     Problem: Coping - Ineffective, Family  Goal: Effective Coping  Outcome: Goal Ongoing  Goal: Communicate with support staff  Outcome: Goal Ongoing  Goal: Knowledge of positive coping methods  Outcome: Goal Ongoing     Problem: Social Interaction - Impaired  Goal: Increased Social Interaction  Outcome: Goal Ongoing  Goal: Knowledge of social interaction  Outcome: Goal Ongoing

## 2021-04-03 NOTE — Care Plan
Pt was observed in day room watching tv with peers as well socializing.He c/o how he lost his job and evicted from his house.Feels helpless and wished his guardian could have acted promptly to prevent him from been evicted.Denied si/hi/ah/paranoia.But endorsed that he continues to blue and red dots.Feels hopeless and helpless.Trazodone given to aid with sleep per pt request.Calm and co-operative.Will continue to monitor.

## 2021-04-03 NOTE — Progress Notes
PSYCHIATRIC PROGRESS NOTE     LOS: 0 days  Voluntary/Involuntary Status: Voluntary  Guardian: Parents (Jamie Jamie [name], Jamie Jamie [name])     MEDICATIONS     Current Psychotropic Medications:   1. Jamie Jamie [name] IM every 28 days (last injection 03/23/2021)  2. Depakote ER 2500 mg QHS     ASSESSMENT & DIAGNOSES     Jamie Jamie [name] is a 25 y.o. Caucasian adult with a history of schizoaffective disorder and polysubstance abuse who presented to [name] as a transfer from OSH with manic symptoms in context of methamphetamine use. This appears to be precipitated by methamphetamine use. Factors that seem to have predisposed him to mania include schizoaffective disorder, substance abuse and uncle with schizophrenia. This current problem is maintained by ongoing substance abuse, lack of consistent medication compliance, and poor insight. However, protective factors include good support from parents who are guardians, motivation to seek treatment, and fair insight into current situation. Proposed treatment will consist of substance detoxification and medication management.    DSM-5 DIAGNOSES:  1. Stimulant use disorder, methamphetamine type, moderate  2. Methamphetamine withdrawal  3. Schizoaffective disorder, bipolar type  4. Tobacco use disorder (chewing tobacco), severe  5. Stimulant use disorder, cocaine type, unspecified severity     PLAN     ? No indications for medication changes on 04/03/21.   ? Received Jamie Jamie [name] 156 mg IM second dose of initiation on 03/23/2021; next dose: 04/20/2021  ? Continue Depakote ER to PTA dose of 2500 mg QHS for schizoaffective disorder and mood stabilization  ? Pending level 1 CARE assessment      Metabolic labs completed on:  Lipid Panel:   LDL   Date Value Ref Range Status   03/19/2021 106 (H) <100 mg/dL Final     HDL   Date Value Ref Range Status   03/19/2021 55 >40 MG/DL Final     VLDL   Date Value Ref Range Status   03/19/2021 15 MG/DL Final     Cholesterol   Date Value Ref Range Status   03/19/2021 174 <200 MG/DL Final     Triglycerides   Date Value Ref Range Status   03/19/2021 76 <150 MG/DL Final     Blood Glucose:  Hemoglobin A1C   Date Value Ref Range Status   03/19/2021 5.3 4.0 - 6.0 % Final     Comment:     Jamie ADA recommends that most patients with type 1 and type 2 diabetes maintain   an A1c level <7%.       Disposition: Jamie [name] has legal guardian. He is unable to leave AMA unless approved by guardian. Pending evaluation for placement.  ?  Seen and discussed with Dr. Norvel Richards  ______________________________________________________________     SUBJECTIVE     Jamie Jamie [name] is found talking on Jamie phone initially during tech time. During revisist, Jamie Jamie [name] reports that he was evicted from his apartment. He recently learned that Jamie individuals that he allowed to stay with him temporarily had stolen his keys and moved into his apartment. His landlord then proceeded to evict Jamie Jamie [name]. He states that he is now hopeful that he can get placed because he is currently without stable housing. He explains that he thinks that it would be a good idea for him to get continued help. He denies any depressed or anxious mood. He also denies any suicidal or homicidal ideation. Jamie [name] has been interacting with his peers on Jamie unit today.     REVIEW OF  SYSTEMS   Review of Systems   Constitutional: Negative for activity change.   HENT: Negative for sore throat.    Eyes: Negative for pain.   Respiratory: Negative for shortness of breath.    Cardiovascular: Negative for chest pain.   Gastrointestinal: Negative for constipation and diarrhea.   Genitourinary: Negative for dysuria.   Musculoskeletal: Negative for myalgias.   Neurological: Negative for dizziness and headaches.   Psychiatric/Behavioral: Negative for agitation.      OBJECTIVE                    Vital Signs:  Current                Vital Signs: 24 Hour Range   BP: 115/66 (06/04 0738)  Temp: 36.7 ?C (98.1 ?F) (06/04 5621)  Pulse: 59 (06/04 0738)  Respirations: 18 PER MINUTE (06/04 0738)  SpO2: 97 % (06/04 0738) BP: (115-152)/(66-88)   Temp:  [36.7 ?C (98.1 ?F)-36.9 ?C (98.4 ?F)]   Pulse:  [59-92]   Respirations:  [18 PER MINUTE-19 PER MINUTE]   SpO2:  [95 %-97 %]    Intensity Pain Scale (Self Report): (not recorded)      Sleep:  Hours of Sleep: 7.25   Quality of Sleep: Resting Quietly    Scheduled Medications:  divalproex (DEPAKOTE ER) ER tablet 2,500 mg, 2,500 mg, Oral, QHS  nicotine (NICODERM CQ STEP 1) 21 mg/day patch 1 patch, 1 patch, Transdermal, QDAY    PRN Medications:  acetaminophen Q6H PRN, calcium carbonate TID PRN, nicotine polacrilex Q2H PRN 4 mg at 04/02/21 1809, OLANZapine Q6H PRN **OR** OLANZapine Q6H PRN, polyethylene glycol 3350 QDAY PRN, traZODone QHS PRN 50 mg at 04/02/21 2108    Mental Status Exam:  ? General/Constitutional: 25 y.o. adult, appears stated age, dressed in hospital attire, fair hygiene   ? Behavior: calm, cooperative, pleasant  ? Speech/Motor: rrr with fair articulation  ? Eye Contact: good  ? Mood: hopeful  ? Affect: euthymic, congruent with stated mood, appropriate for context of conversation  ? Thought Process: linear and goal directed  ? Associations: Intact  ? Thought Content: Denies SI/HI. Shows no evidence of delusions or paranoia   ? Perception: reports continued VH, denies AH. Does not appear to be responding to internal stimuli   ? Insight/Judgment: fair/fair  ? Orientation: Alert and awake  ? Recent and Remote Memory: grossly intact though not formally assessed  ? Attention span and concentration: intact  ? Language: fluent  ? Fund of knowledge and vocabulary: average    Focused Physical Exam:  ? Musculoskeletal: gait without ataxia  ? Neurological: no tic or tremor, no gross deficits   ______________________________________________________________  Orest Dikes, DO

## 2021-04-03 NOTE — Progress Notes
Chart check completed.

## 2021-04-03 NOTE — Care Plan
Eliza is compliant with medications.  Thai denies AH.  Rollie states he sees red and blue dots, floaters, and stars.  Pt denies SIHISH.  Pt states slept good and appetite is alright.  Baudelio states his mood is normal and feels fine.  Pt denies anxiety and depression.  Pt denies pain.  Bowyn is attending groups and encouraged him to attend groups.  Whitley denies any questions or concerns at this time.    Problem: Health Maintenance - Impaired  Goal: Able to perfom ADL's  Outcome: Goal Ongoing  Goal: Adequate nutritional intake  Outcome: Goal Ongoing  Goal: Establish therapeutic relationships  Outcome: Goal Ongoing  Goal: Knowledge of health maintenance  Outcome: Goal Ongoing     Problem: Mood - Altered  Goal: Stabilize mood  Outcome: Goal Ongoing  Goal: Knowledge of Altered Mood  Outcome: Goal Ongoing     Problem: Self-esteem - Low  Goal: Demonstration of positive self-esteem  Outcome: Goal Ongoing  Goal: Knowledge of low self-esteem  Outcome: Goal Ongoing     Problem: Thought Process - Altered  Goal: Demonstration of organized thought processes  Outcome: Goal Ongoing  Goal: Knowledge of altered thought process  Outcome: Goal Ongoing     Problem: Violence, self/other-directed, Risk of  Goal: Absence of violence  Outcome: Goal Ongoing  Goal: Knowledge of risk for violence, self/other-directed  Outcome: Goal Ongoing     Problem: Transition Readiness  Goal: Knowledge of transition readiness  Outcome: Goal Ongoing     Problem: Cognitive-Perceptual Pattern - Impaired  Goal: Demonstration of organized thought processes  Outcome: Goal Ongoing  Goal: Knowledge of prescribed medication  Outcome: Goal Ongoing     Problem: Coping - Ineffective, Family  Goal: Effective Coping  Outcome: Goal Ongoing  Goal: Communicate with support staff  Outcome: Goal Ongoing  Goal: Knowledge of positive coping methods  Outcome: Goal Ongoing     Problem: Social Interaction - Impaired  Goal: Increased Social Interaction  Outcome: Goal Ongoing  Goal: Knowledge of social interaction  Outcome: Goal Ongoing

## 2021-04-04 NOTE — Care Plan
Pt was out in the milieu watching tv with peers. Calm and co-operative.Denied si/hi/ah/paranoia.Denied feeling hopeless but wishes he hasn't lost his job and apartment.Stated that he will find another job after he gets his placement.Continues to see blue and red dots.Trazodone and Nicotine gum given per pt request.Pt in a relaxed mood and hopeful.Will continue to monitor.

## 2021-04-04 NOTE — Progress Notes
PSYCHIATRIC PROGRESS NOTE     LOS: 0 days  Voluntary/Involuntary Status: Voluntary  Guardian: Parents ([name] [place], Kent Rund)     MEDICATIONS     Current Psychotropic Medications:   1. [name] IM every 28 days (last injection 03/23/2021)  2. Depakote ER 2500 mg QHS     ASSESSMENT & DIAGNOSES     Jamie [name] is a 25 y.o. Caucasian adult with a history of schizoaffective disorder and polysubstance abuse who presented to [facility] as a transfer from OSH with manic symptoms in context of methamphetamine use. This appears to be precipitated by methamphetamine use. Factors that seem to have predisposed him to mania include schizoaffective disorder, substance abuse and uncle with schizophrenia. This current problem is maintained by ongoing substance abuse, lack of consistent medication compliance, and poor insight. However, protective factors include good support from parents who are guardians, motivation to seek treatment, and fair insight into current situation. Proposed treatment will consist of substance detoxification and medication management.    DSM-5 DIAGNOSES:  1. Stimulant use disorder, methamphetamine type, moderate  2. Methamphetamine withdrawal  3. Schizoaffective disorder, bipolar type  4. Tobacco use disorder (chewing tobacco), severe  5. Stimulant use disorder, cocaine type, unspecified severity     PLAN     ? No indications for medication changes on 04/04/21.   ? Received [name] 156 mg IM second dose of initiation on 03/23/2021; next dose: 04/20/2021  ? Continue Depakote ER to PTA dose of 2500 mg QHS for schizoaffective disorder and mood stabilization  ? Pending level 1 CARE assessment      Metabolic labs completed on:  Lipid Panel:   LDL   Date Value Ref Range Status   03/19/2021 106 (H) <100 mg/dL Final     HDL   Date Value Ref Range Status   03/19/2021 55 >40 MG/DL Final     VLDL   Date Value Ref Range Status   03/19/2021 15 MG/DL Final     Cholesterol   Date Value Ref Range Status   03/19/2021 174 <200 MG/DL Final     Triglycerides   Date Value Ref Range Status   03/19/2021 76 <150 MG/DL Final     Blood Glucose:  Hemoglobin A1C   Date Value Ref Range Status   03/19/2021 5.3 4.0 - 6.0 % Final     Comment:     The ADA recommends that most patients with type 1 and type 2 diabetes maintain   an A1c level <7%.       Disposition: Patient has legal guardian. He is unable to leave AMA unless approved by guardian. Pending evaluation for placement.  ?______________________________________________________________     SUBJECTIVE     NAEO. No PRNs required. Pt seen in the day room and reports his mood is fine. He denies depression or anxiety. He denies SI/HI and AVH. He reports he is sleeping well and tolerating his diet. He notes that he is just eager to find placement. He had n other concerns or complaints.      REVIEW OF SYSTEMS   Review of Systems   Constitutional: Negative for activity change.   HENT: Negative for sore throat.    Eyes: Negative for pain.   Respiratory: Negative for shortness of breath.    Cardiovascular: Negative for chest pain.   Gastrointestinal: Negative for constipation and diarrhea.   Genitourinary: Negative for dysuria.   Musculoskeletal: Negative for myalgias.   Neurological: Negative for dizziness and headaches.  Psychiatric/Behavioral: Negative for agitation.      OBJECTIVE                    Vital Signs:  Current                Vital Signs: 24 Hour Range   BP: 120/59 (06/05 0700)  Temp: 36.7 ?C (98.1 ?F) (06/05 0700)  Pulse: 65 (06/05 0700)  Respirations: 18 PER MINUTE (06/05 0700)  SpO2: 96 % (06/05 0700) BP: (120-123)/(59-72)   Temp:  [36.7 ?C (98.1 ?F)-37 ?C (98.6 ?F)]   Pulse:  [65-95]   Respirations:  [18 PER MINUTE]   SpO2:  [95 %-96 %]    Intensity Pain Scale (Self Report): (not recorded)      Sleep:  Hours of Sleep: 7.50   Quality of Sleep: Resting Quietly    Scheduled Medications:  divalproex (DEPAKOTE ER) ER tablet 2,500 mg, 2,500 mg, Oral, QHS  nicotine (NICODERM CQ STEP 1) 21 mg/day patch 1 patch, 1 patch, Transdermal, QDAY    PRN Medications:  acetaminophen Q6H PRN, calcium carbonate TID PRN, nicotine polacrilex Q2H PRN 4 mg at 04/04/21 1057, OLANZapine Q6H PRN **OR** OLANZapine Q6H PRN, polyethylene glycol 3350 QDAY PRN, traZODone QHS PRN 50 mg at 04/03/21 2026    Mental Status Exam:  ? General/Constitutional: 25 y.o. adult, appears stated age, dressed in hospital attire, fair hygiene   ? Behavior: calm, cooperative, pleasant  ? Speech/Motor: rrr with fair articulation  ? Eye Contact: good  ? Mood: Fine  ? Affect: Content, mood congruent  ? Thought Process: linear and goal directed  ? Associations: Intact  ? Thought Content: Denies SI/HI. Shows no evidence of delusions or paranoia   ? Perception: reports continued VH, denies AH. Does not appear to be responding to internal stimuli   ? Insight/Judgment: fair/fair     ______________________________________________________________  Rae Mar, MD

## 2021-04-04 NOTE — Care Plan
Jamie [name] is compliant with medications.  Pt denies AH.  Pt states he sees red and blue dots and floaters as VH.  Pt states it was hard to stay asleep and has a pretty good appetite.  Pt states he is bored but is playing Solitaire in the milieu.  Pt states his mood is fine and is not agitated just waiting on somewhere to discharge to.  Pt c/o anxiety of 3 r/t where he will be living, job situation and not having a car.  Pt denies depression.  Pt denies pain.  Jamie [name] denies any questions or concerns at this time.    Problem: Health Maintenance - Impaired  Goal: Able to perfom ADL's  Outcome: Goal Ongoing  Goal: Adequate nutritional intake  Outcome: Goal Ongoing  Goal: Establish therapeutic relationships  Outcome: Goal Ongoing  Goal: Knowledge of health maintenance  Outcome: Goal Ongoing     Problem: Mood - Altered  Goal: Stabilize mood  Outcome: Goal Ongoing  Goal: Knowledge of Altered Mood  Outcome: Goal Ongoing     Problem: Self-esteem - Low  Goal: Demonstration of positive self-esteem  Outcome: Goal Ongoing  Goal: Knowledge of low self-esteem  Outcome: Goal Ongoing     Problem: Thought Process - Altered  Goal: Demonstration of organized thought processes  Outcome: Goal Ongoing  Goal: Knowledge of altered thought process  Outcome: Goal Ongoing     Problem: Violence, self/other-directed, Risk of  Goal: Absence of violence  Outcome: Goal Ongoing  Goal: Knowledge of risk for violence, self/other-directed  Outcome: Goal Ongoing     Problem: Transition Readiness  Goal: Knowledge of transition readiness  Outcome: Goal Ongoing     Problem: Cognitive-Perceptual Pattern - Impaired  Goal: Demonstration of organized thought processes  Outcome: Goal Ongoing  Goal: Knowledge of prescribed medication  Outcome: Goal Ongoing     Problem: Coping - Ineffective, Family  Goal: Effective Coping  Outcome: Goal Ongoing  Goal: Communicate with support staff  Outcome: Goal Ongoing  Goal: Knowledge of positive coping methods  Outcome: Goal Ongoing     Problem: Social Interaction - Impaired  Goal: Increased Social Interaction  Outcome: Goal Ongoing  Goal: Knowledge of social interaction  Outcome: Goal Ongoing

## 2021-04-04 NOTE — Progress Notes
Chart check completed.

## 2021-04-04 NOTE — Progress Notes
Chart check completed

## 2021-04-05 NOTE — Care Plan
Pt has been calm, cooperative and compliant. Mood and behavior have been stable. Denies all psych. Denies anxiety and depression. No behavioral issues....      Problem: Health Maintenance - Impaired  Goal: Able to perfom ADL's  Outcome: Goal Ongoing  Goal: Adequate nutritional intake  Outcome: Goal Ongoing  Goal: Establish therapeutic relationships  Outcome: Goal Ongoing  Goal: Knowledge of health maintenance  Outcome: Goal Ongoing     Problem: Mood - Altered  Goal: Stabilize mood  Outcome: Goal Ongoing  Goal: Knowledge of Altered Mood  Outcome: Goal Ongoing     Problem: Self-esteem - Low  Goal: Demonstration of positive self-esteem  Outcome: Goal Ongoing  Goal: Knowledge of low self-esteem  Outcome: Goal Ongoing     Problem: Thought Process - Altered  Goal: Demonstration of organized thought processes  Outcome: Goal Ongoing  Goal: Knowledge of altered thought process  Outcome: Goal Ongoing     Problem: Violence, self/other-directed, Risk of  Goal: Absence of violence  Outcome: Goal Ongoing  Goal: Knowledge of risk for violence, self/other-directed  Outcome: Goal Ongoing     Problem: Transition Readiness  Goal: Knowledge of transition readiness  Outcome: Goal Ongoing     Problem: Cognitive-Perceptual Pattern - Impaired  Goal: Demonstration of organized thought processes  Outcome: Goal Ongoing  Goal: Knowledge of prescribed medication  Outcome: Goal Ongoing     Problem: Coping - Ineffective, Family  Goal: Effective Coping  Outcome: Goal Ongoing  Goal: Communicate with support staff  Outcome: Goal Ongoing  Goal: Knowledge of positive coping methods  Outcome: Goal Ongoing     Problem: Social Interaction - Impaired  Goal: Increased Social Interaction  Outcome: Goal Ongoing  Goal: Knowledge of social interaction  Outcome: Goal Ongoing

## 2021-04-05 NOTE — Progress Notes
PSYCHIATRIC PROGRESS NOTE     LOS: 20 days  Voluntary/Involuntary Status: Voluntary  Guardian: Parents ([name] [place], Kent [place])     MEDICATIONS     Current Psychotropic Medications:   1. [name] IM every 28 days (last injection 03/23/2021)  2. Depakote ER 2500 mg QHS     ASSESSMENT & DIAGNOSES     Jamie [name] is a 25 y.o. Caucasian adult with a history of schizoaffective disorder and polysubstance abuse who presented to [facility] as a transfer from OSH with manic symptoms in context of methamphetamine use. This appears to be precipitated by methamphetamine use. Factors that seem to have predisposed him to mania include schizoaffective disorder, substance abuse and uncle with schizophrenia. This current problem is maintained by ongoing substance abuse, lack of consistent medication compliance, and poor insight. However, protective factors include good support from parents who are guardians, motivation to seek treatment, and fair insight into current situation. Proposed treatment will consist of substance detoxification and medication management.    DSM-5 DIAGNOSES:  1. Stimulant use disorder, methamphetamine type, moderate  2. Methamphetamine withdrawal  3. Schizoaffective disorder, bipolar type  4. Tobacco use disorder (chewing tobacco), severe  5. Stimulant use disorder, cocaine type, unspecified severity     PLAN     ? No indications for medication changes on 04/05/21.   ? Received [name] 156 mg IM second dose of initiation on 03/23/2021; next dose: 04/20/2021  ? Continue Depakote ER to PTA dose of 2500 mg QHS for schizoaffective disorder and mood stabilization  ? Pending level 1 CARE assessment      Metabolic labs completed on:  Lipid Panel:   LDL   Date Value Ref Range Status   03/19/2021 106 (H) <100 mg/dL Final     HDL   Date Value Ref Range Status   03/19/2021 55 >40 MG/DL Final     VLDL   Date Value Ref Range Status   03/19/2021 15 MG/DL Final     Cholesterol   Date Value Ref Range Status   03/19/2021 174 <200 MG/DL Final     Triglycerides   Date Value Ref Range Status   03/19/2021 76 <150 MG/DL Final     Blood Glucose:  Hemoglobin A1C   Date Value Ref Range Status   03/19/2021 5.3 4.0 - 6.0 % Final     Comment:     The ADA recommends that most patients with type 1 and type 2 diabetes maintain   an A1c level <7%.       Disposition: Patient has legal guardian. He is unable to leave AMA unless approved by guardian. Pending evaluation for placement.  ?______________________________________________________________     SUBJECTIVE     No acute events overnight.   No PRNs required.     Pt seen in the day room and reports his mood is fine. He denies depression or anxiety. He denies SI/HI and AVH. He reports he is sleeping well and tolerating his diet. He inquires about the possibility of attending a group home on discharge as opposed to a higher level facility, citing possible faster placement and more freedom.      REVIEW OF SYSTEMS   Review of Systems   Constitutional: Negative for activity change.   HENT: Negative for sore throat.    Eyes: Negative for pain.   Respiratory: Negative for shortness of breath.    Cardiovascular: Negative for chest pain.   Gastrointestinal: Negative for constipation and diarrhea.   Genitourinary: Negative  for dysuria.   Musculoskeletal: Negative for myalgias.   Neurological: Negative for dizziness and headaches.   Psychiatric/Behavioral: Negative for agitation.      OBJECTIVE                    Vital Signs:  Current                Vital Signs: 24 Hour Range   BP: 114/58 (06/06 0735)  Temp: 36.5 ?C (97.7 ?F) (06/06 9147)  Pulse: 63 (06/06 0735)  Respirations: 18 PER MINUTE (06/06 0735)  SpO2: 96 % (06/06 0735) BP: (114-127)/(58-66)   Temp:  [36.5 ?C (97.7 ?F)-37.2 ?C (99 ?F)]   Pulse:  [63-77]   Respirations:  [18 PER MINUTE]   SpO2:  [96 %]    Intensity Pain Scale (Self Report): (not recorded)      Sleep:  Hours of Sleep: 6   Quality of Sleep: Resting Quietly    Scheduled Medications:  divalproex (DEPAKOTE ER) ER tablet 2,500 mg, 2,500 mg, Oral, QHS  nicotine (NICODERM CQ STEP 1) 21 mg/day patch 1 patch, 1 patch, Transdermal, QDAY    PRN Medications:  acetaminophen Q6H PRN, calcium carbonate TID PRN, nicotine polacrilex Q2H PRN 4 mg at 04/04/21 1057, OLANZapine Q6H PRN **OR** OLANZapine Q6H PRN, polyethylene glycol 3350 QDAY PRN, traZODone QHS PRN 50 mg at 04/04/21 2131    Mental Status Exam:  ? General/Constitutional: 25 y.o. adult, appears stated age, dressed in hospital attire, fair hygiene   ? Behavior: calm, cooperative, pleasant  ? Speech/Motor: rrr with fair articulation  ? Eye Contact: good  ? Mood: Fine  ? Affect: Content, mood congruent  ? Thought Process: linear and goal directed  ? Associations: Intact  ? Thought Content: Denies SI/HI. Shows no evidence of delusions or paranoia   ? Perception: reports continued VH, denies AH. Does not appear to be responding to internal stimuli   ? Insight/Judgment: fair/fair     ______________________________________________________________  [name], MD   PGY-2 Psychiatry

## 2021-04-05 NOTE — Progress Notes
Chart check completed.

## 2021-04-05 NOTE — Care Plan
Pt meeting with treatment team daily.  Discharge planning ongoing.  Patient denies pain, SI/HI/AVH.  Mood and behavior have been stable.  Pt out in the milieu, watched tv, and interacting with peers.  Pt compliant with HS medication.       Problem: Health Maintenance - Impaired  Goal: Able to perfom ADL's  Outcome: Goal Ongoing  Goal: Adequate nutritional intake  Outcome: Goal Ongoing  Goal: Establish therapeutic relationships  Outcome: Goal Ongoing  Goal: Knowledge of health maintenance  Outcome: Goal Ongoing     Problem: Mood - Altered  Goal: Stabilize mood  Outcome: Goal Ongoing  Goal: Knowledge of Altered Mood  Outcome: Goal Ongoing     Problem: Self-esteem - Low  Goal: Demonstration of positive self-esteem  Outcome: Goal Ongoing  Goal: Knowledge of low self-esteem  Outcome: Goal Ongoing     Problem: Thought Process - Altered  Goal: Demonstration of organized thought processes  Outcome: Goal Ongoing  Goal: Knowledge of altered thought process  Outcome: Goal Ongoing     Problem: Violence, self/other-directed, Risk of  Goal: Absence of violence  Outcome: Goal Ongoing  Goal: Knowledge of risk for violence, self/other-directed  Outcome: Goal Ongoing     Problem: Transition Readiness  Goal: Knowledge of transition readiness  Outcome: Goal Ongoing     Problem: Cognitive-Perceptual Pattern - Impaired  Goal: Demonstration of organized thought processes  Outcome: Goal Ongoing  Goal: Knowledge of prescribed medication  Outcome: Goal Ongoing     Problem: Coping - Ineffective, Family  Goal: Effective Coping  Outcome: Goal Ongoing  Goal: Communicate with support staff  Outcome: Goal Ongoing  Goal: Knowledge of positive coping methods  Outcome: Goal Ongoing     Problem: Social Interaction - Impaired  Goal: Increased Social Interaction  Outcome: Goal Ongoing  Goal: Knowledge of social interaction  Outcome: Goal Ongoing

## 2021-04-05 NOTE — Care Coordination-Inpatient
Care Coordination - Inpatient Note      Patient Name:   Jamie [name]                        MRN:  [id number]  Admission Date:  03/16/2021    1331: Emailed information to Colbert Coyer to request Level I assessment.     Erik Obey, LMSW  04/05/2021

## 2021-04-06 NOTE — Care Plan
Jamie [name] is compliant with medications. Pt denies SIHISH.  Pt denies AH.  Pt states slept fine but woke up tired and has a pretty good appetite.  Pt states he feels fine but feels tired and has a flat affect.  Pt denies anxiety and depression.  Pt has VH of floaters, and red and blue dots.  Pt denies pain.  Jamie [name] encouraged to attend groups.  Jamie [name] denies any questions or concerns at this time.    Problem: Health Maintenance - Impaired  Goal: Able to perfom ADL's  Outcome: Goal Ongoing  Goal: Adequate nutritional intake  Outcome: Goal Ongoing  Goal: Establish therapeutic relationships  Outcome: Goal Ongoing  Goal: Knowledge of health maintenance  Outcome: Goal Ongoing     Problem: Mood - Altered  Goal: Stabilize mood  Outcome: Goal Ongoing  Goal: Knowledge of Altered Mood  Outcome: Goal Ongoing     Problem: Self-esteem - Low  Goal: Demonstration of positive self-esteem  Outcome: Goal Ongoing  Goal: Knowledge of low self-esteem  Outcome: Goal Ongoing     Problem: Thought Process - Altered  Goal: Demonstration of organized thought processes  Outcome: Goal Ongoing  Goal: Knowledge of altered thought process  Outcome: Goal Ongoing     Problem: Violence, self/other-directed, Risk of  Goal: Absence of violence  Outcome: Goal Ongoing  Goal: Knowledge of risk for violence, self/other-directed  Outcome: Goal Ongoing     Problem: Transition Readiness  Goal: Knowledge of transition readiness  Outcome: Goal Ongoing     Problem: Cognitive-Perceptual Pattern - Impaired  Goal: Demonstration of organized thought processes  Outcome: Goal Ongoing  Goal: Knowledge of prescribed medication  Outcome: Goal Ongoing     Problem: Coping - Ineffective, Family  Goal: Effective Coping  Outcome: Goal Ongoing  Goal: Communicate with support staff  Outcome: Goal Ongoing  Goal: Knowledge of positive coping methods  Outcome: Goal Ongoing     Problem: Social Interaction - Impaired  Goal: Increased Social Interaction  Outcome: Goal Ongoing  Goal: Knowledge of social interaction  Outcome: Goal Ongoing

## 2021-04-06 NOTE — Care Plan
RN Shift Assessment    Pt observed socializing with peers in milieu. A/Ox 4. Calm and cooperative. Reported VH as "the usual", but did not elaborate. Denied anxiety, depression, SI/HI, AH, and pain. Compliant with medications. Will continue to monitor pt per protocol.    Mental Status Exam  Legal Status: Voluntary Admission (per guardian)  General Appearance: Normal  Mood / Affect: Congruent  Speech: Normal  Content Of Thought: Normal  Motor Activity: Normal  Flow of Thought: Linear  Sensorium: Orientation to person, Orientation to place, Orientation to situation  Insight / Judgment: Fair judgment, Fair insight  Behavior: Calm, Cooperative  Patient Strengths: Interpersonal relationships and supports, i.e. family, friends, peers     PRN Medication Administered:  Trazodone 50 mg tablet PO  Non pharmacological interventions attempted prior to PRN medication administration:  Decrease stimuli  Brief narrative of reason for PRN medication administration:   Pt requested PRN medication as a sleep aid

## 2021-04-06 NOTE — Progress Notes
Chart check completed.

## 2021-04-06 NOTE — Progress Notes
[facility]  Therapy/Phone Call Progress Note    NAME:Jamie [name] MRN: [id number] DOB:[birth year] AGE: 25 y.o.  ADMISSION DATE: 03/16/2021 DAYS ADMITTED: LOS: 0 days    Date of Service:  04/06/21    Active Problems:    Schizoaffective disorder, bipolar type (HCC)    Tobacco use disorder, severe, dependence    Withdrawal from methamphetamine [facility])    Methamphetamine use disorder, moderate (HCC)    Objective:  Therapist attempted to contact [employer], Slade's mother/co-guardian.  The phone was answered and then Therapist heard someone ask if they should hang up.  Two subsequent calls to the same number went unanswered.    Therapist then attempted to call Patient's father/co-guardian.  A voicemail message was left requesting a call back.    Treatment Team will continue to follow.

## 2021-04-06 NOTE — Progress Notes
PSYCHIATRIC PROGRESS NOTE     LOS: 20 days  Voluntary/Involuntary Status: Voluntary  Guardian: Parents ([name] [place], Kent [place])     MEDICATIONS     Current Psychotropic Medications:   1. [name] IM every 28 days (last injection 03/23/2021)  2. Depakote ER 2500 mg QHS     ASSESSMENT & DIAGNOSES     Jamie [name] is a 25 y.o. Caucasian adult with a history of schizoaffective disorder and polysubstance abuse who presented to [facility] as a transfer from OSH with manic symptoms in context of methamphetamine use. This appears to be precipitated by methamphetamine use. Factors that seem to have predisposed him to mania include schizoaffective disorder, substance abuse and uncle with schizophrenia. This current problem is maintained by ongoing substance abuse, lack of consistent medication compliance, and poor insight. However, protective factors include good support from parents who are guardians, motivation to seek treatment, and fair insight into current situation. Proposed treatment will consist of substance detoxification and medication management.    DSM-5 DIAGNOSES:  1. Stimulant use disorder, methamphetamine type, moderate  2. Methamphetamine withdrawal  3. Schizoaffective disorder, bipolar type  4. Tobacco use disorder (chewing tobacco), severe  5. Stimulant use disorder, cocaine type, unspecified severity     PLAN     ? No indications for medication changes on 04/06/21.   ? Received [name] 156 mg IM second dose of initiation on 03/23/2021; next dose: 04/20/2021  ? Continue Depakote ER PTA dose 2500 mg QHS for schizoaffective disorder and mood stabilization  ? Pending level 1 CARE assessment    Metabolic labs completed on:  Lipid Panel:   LDL   Date Value Ref Range Status   03/19/2021 106 (H) <100 mg/dL Final     HDL   Date Value Ref Range Status   03/19/2021 55 >40 MG/DL Final     VLDL   Date Value Ref Range Status   03/19/2021 15 MG/DL Final     Cholesterol   Date Value Ref Range Status 03/19/2021 174 <200 MG/DL Final     Triglycerides   Date Value Ref Range Status   03/19/2021 76 <150 MG/DL Final     Blood Glucose:  Hemoglobin A1C   Date Value Ref Range Status   03/19/2021 5.3 4.0 - 6.0 % Final     Comment:     The ADA recommends that most patients with type 1 and type 2 diabetes maintain   an A1c level <7%.       Disposition: Patient has legal guardian. He is unable to leave AMA unless approved by guardian. Pending evaluation for placement.  ?______________________________________________________________     SUBJECTIVE     No acute events overnight.     Pt seen in the day room and reports his mood is fine. He denies depression or anxiety. He denies SI/HI and AVH. He reports he is sleeping well and tolerating his diet. He continues to inquire regarding placement, and stating preference for a group home. Parents/guardians were called, and during call we discussed placement and continued recommendation for level one care facility.     REVIEW OF SYSTEMS   Review of Systems   Constitutional: Negative for activity change.   HENT: Negative for sore throat.    Eyes: Negative for pain.   Respiratory: Negative for shortness of breath.    Cardiovascular: Negative for chest pain.   Gastrointestinal: Negative for constipation and diarrhea.   Genitourinary: Negative for dysuria.   Musculoskeletal: Negative for myalgias.  Neurological: Negative for dizziness and headaches.   Psychiatric/Behavioral: Negative for agitation.      OBJECTIVE                    Vital Signs:  Current                Vital Signs: 24 Hour Range   BP: 95/57 (06/07 0700)  Temp: 36.7 ?C (98.1 ?F) (06/07 0700)  Pulse: 55 (06/07 0700)  Respirations: 14 PER MINUTE (06/07 0700)  SpO2: 95 % (06/07 0700) BP: (95-125)/(57-66)   Temp:  [36.5 ?C (97.7 ?F)-36.7 ?C (98.1 ?F)]   Pulse:  [55-95]   Respirations:  [14 PER MINUTE-16 PER MINUTE]   SpO2:  [95 %-98 %]    Intensity Pain Scale (Self Report): (not recorded)      Sleep:  Hours of Sleep: 7.5 Quality of Sleep: Resting Quietly    Scheduled Medications:  divalproex (DEPAKOTE ER) ER tablet 2,500 mg, 2,500 mg, Oral, QHS  nicotine (NICODERM CQ STEP 1) 21 mg/day patch 1 patch, 1 patch, Transdermal, QDAY    PRN Medications:  acetaminophen Q6H PRN, calcium carbonate TID PRN, nicotine polacrilex Q2H PRN 4 mg at 04/05/21 2007, OLANZapine Q6H PRN **OR** OLANZapine Q6H PRN, polyethylene glycol 3350 QDAY PRN, traZODone QHS PRN 50 mg at 04/05/21 2126    Mental Status Exam:  ? General/Constitutional: 25 y.o. adult, appears stated age, dressed in hospital attire, fair hygiene   ? Behavior: calm, cooperative, pleasant  ? Speech/Motor: rrr with fair articulation  ? Eye Contact: good  ? Mood: Fine  ? Affect: Content, mood congruent  ? Thought Process: linear and goal directed  ? Associations: Intact  ? Thought Content: Denies SI/HI. Shows no evidence of delusions or paranoia   ? Perception: reports continued VH, denies AH. Does not appear to be responding to internal stimuli   ? Insight/Judgment: fair/fair     ______________________________________________________________  [name], MD   PGY-2 Psychiatry

## 2021-04-07 NOTE — Behavioral Health Treatment Team
TREATMENT TEAM NOTE  Name: Azaan Leask        MRN: [id number]          DOB: 05-Apr-1996            Age: 25 y.o.  Admission Date: 03/16/2021       LOS: 0 days    Date of Service: 04/07/2021        Attendees:  UR: Ival Bible, LMSW  Nursing: Freddie Apley, RN, BSN  Therapy: Clement Husbands, [place], [facility]  Therapy: Marijean Heath, LMLP, LCP  CM: Henderson Cloud, LMSW  CM: Neysa Bonito, LPC  Rec Therapy: Clare Charon, CTRS  Music Therapy: Berle Mull, MTBC, LPC  Pharmacy: Royetta Crochet, PharmD  Psychology: Assunta Gambles. Lowry Bowl, PhD  Psychology: Peter Minium, PhD  Psychiatry: Frankey Poot, APRN-NP  Psychiatry Resident: Milana Kidney, DO  Psychiatry Resident: [name], MD      Significant Points Discussed:   Therapy attempted to contact guardians  Moderately engaged in groups  Level 1 assessment needed       Treatment Plan Discussion: waiting for placement, continue to monitor

## 2021-04-07 NOTE — Progress Notes
PSYCHIATRIC PROGRESS NOTE     LOS: 20 days  Voluntary/Involuntary Status: Voluntary  Guardian: Parents ([name] [place], Kent [place])     MEDICATIONS     Current Psychotropic Medications:   1. [name] IM every 28 days (last injection 03/23/2021)  2. Depakote ER 2500 mg QHS     ASSESSMENT & DIAGNOSES     Jamie [name] is a 25 y.o. Caucasian adult with a history of schizoaffective disorder and polysubstance abuse who presented to [facility] as a transfer from OSH with manic symptoms in context of methamphetamine use. This appears to be precipitated by methamphetamine use. Factors that seem to have predisposed him to mania include schizoaffective disorder, substance abuse and uncle with schizophrenia. This current problem is maintained by ongoing substance abuse, lack of consistent medication compliance, and poor insight. However, protective factors include good support from parents who are guardians, motivation to seek treatment, and fair insight into current situation. Proposed treatment will consist of substance detoxification and medication management.    DSM-5 DIAGNOSES:  1. Stimulant use disorder, methamphetamine type, moderate  2. Methamphetamine withdrawal  3. Schizoaffective disorder, bipolar type  4. Tobacco use disorder (chewing tobacco), severe  5. Stimulant use disorder, cocaine type, unspecified severity     PLAN     ? No indications for medication changes on 04/07/21.   ? Received [name] 156 mg IM second dose of initiation on 03/23/2021; next dose: 04/20/2021  ? Continue Depakote ER PTA dose 2500 mg QHS for schizoaffective disorder and mood stabilization  ? Pending level 1 CARE assessment    Metabolic labs completed on:  Lipid Panel:   LDL   Date Value Ref Range Status   03/19/2021 106 (H) <100 mg/dL Final     HDL   Date Value Ref Range Status   03/19/2021 55 >40 MG/DL Final     VLDL   Date Value Ref Range Status   03/19/2021 15 MG/DL Final     Cholesterol   Date Value Ref Range Status 03/19/2021 174 <200 MG/DL Final     Triglycerides   Date Value Ref Range Status   03/19/2021 76 <150 MG/DL Final     Blood Glucose:  Hemoglobin A1C   Date Value Ref Range Status   03/19/2021 5.3 4.0 - 6.0 % Final     Comment:     The ADA recommends that most patients with type 1 and type 2 diabetes maintain   an A1c level <7%.       Disposition: Patient has legal guardian. He is unable to leave AMA unless approved by guardian. Pending evaluation for placement.  ?______________________________________________________________     SUBJECTIVE     No acute events overnight. Per CM Level 1 assessor reached out yesterday, however date of assessment has not been established to date. He denies depression or anxiety. He denies SI/HI and AVH. He reports he is sleeping well and tolerating his diet.      REVIEW OF SYSTEMS   Review of Systems   Constitutional: Negative for activity change.   HENT: Negative for sore throat.    Eyes: Negative for pain.   Respiratory: Negative for shortness of breath.    Cardiovascular: Negative for chest pain.   Gastrointestinal: Negative for constipation and diarrhea.   Genitourinary: Negative for dysuria.   Musculoskeletal: Negative for myalgias.   Neurological: Negative for dizziness and headaches.   Psychiatric/Behavioral: Negative for agitation.      OBJECTIVE  Vital Signs:  Current                Vital Signs: 24 Hour Range   BP: 106/59 (06/08 0700)  Temp: 36.6 ?C (97.9 ?F) (06/08 0700)  Pulse: 62 (06/08 0700)  Respirations: 16 PER MINUTE (06/08 0700)  SpO2: 96 % (06/08 0700) BP: (106-124)/(59-68)   Temp:  [36.6 ?C (97.9 ?F)-36.9 ?C (98.4 ?F)]   Pulse:  [62-78]   Respirations:  [16 PER MINUTE]   SpO2:  [96 %]    Intensity Pain Scale (Self Report): (not recorded)      Sleep:  Hours of Sleep: 6.0   Quality of Sleep: Resting Quietly    Scheduled Medications:  divalproex (DEPAKOTE ER) ER tablet 2,500 mg, 2,500 mg, Oral, QHS  nicotine (NICODERM CQ STEP 1) 21 mg/day patch 1 patch, 1 patch, Transdermal, QDAY    PRN Medications:  acetaminophen Q6H PRN, calcium carbonate TID PRN, nicotine polacrilex Q2H PRN 4 mg at 04/06/21 1415, OLANZapine Q6H PRN **OR** OLANZapine Q6H PRN, polyethylene glycol 3350 QDAY PRN, traZODone QHS PRN 50 mg at 04/06/21 2145    Mental Status Exam:  ? General/Constitutional: 25 y.o. adult, appears stated age, dressed in hospital attire, fair hygiene   ? Behavior: calm, cooperative, pleasant  ? Speech/Motor: rrr with fair articulation  ? Eye Contact: good  ? Mood: Fine  ? Affect: Content, mood congruent  ? Thought Process: linear and goal directed  ? Associations: Intact  ? Thought Content: Denies SI/HI. Shows no evidence of delusions or paranoia   ? Perception: reports continued VH, denies AH. Does not appear to be responding to internal stimuli   ? Insight/Judgment: fair/fair     ______________________________________________________________  [name], MD   PGY-2 Psychiatry

## 2021-04-07 NOTE — Progress Notes
[facility]  Therapy Progress Note    NAME:Bashir Karam Dunson MRN: [id number] DOB:01/19/1996 AGE: 25 y.o.  ADMISSION DATE: 03/16/2021 DAYS ADMITTED: LOS: 0 days    Date of Service:  04/07/21    Active Problems:    Schizoaffective disorder, bipolar type (HCC)    Tobacco use disorder, severe, dependence    Withdrawal from methamphetamine [facility])    Methamphetamine use disorder, moderate (HCC)    Objective:  Therapist approached Patient while he was sitting in the small TV room, playing Solitaire.  Eye contact WNL.  Affect full; mood congruent.  Patient displayed an appropriate sense of humor.    Narrative:  Patient said he had his Level I screen today and added that he "failed."  Patient acknowledged he was kidding.  Therapist reviewed the importance of working with staff and reminded Patient that once placed, he would still be evaluated for progress and whether continued placement would be appropriate.  Patient was encouraged to continue reducing impulsivity and was reminded that a longer stay will reinforce his ongoing sobriety.       Follow up:  Treatment Team will continue to follow.

## 2021-04-07 NOTE — Care Plan
The pt was out in the milieu 90% of the evening shift. The pt played cards with 3 of his peers. The pt interacted with several peers. The pt described his day as beinggood. The pt denies any SI or hi. The pt denies any AH but endorses some VH in the form of stars or floaters. The pt stated his appetite was good The pt rated his anxiety as zero and depression as zero.  The pt asked for something to help him rest. The pt received Trazodone 50 mg po at 2145 for sleep. The pt is currently resting quietly.  Problem: Health Maintenance - Impaired  Goal: Able to perfom ADL's  Outcome: Goal Ongoing  Goal: Adequate nutritional intake  Outcome: Goal Ongoing     Problem: Mood - Altered  Goal: Stabilize mood  Outcome: Goal Ongoing     Problem: Thought Process - Altered  Goal: Demonstration of organized thought processes  Outcome: Goal Ongoing     Problem: Violence, self/other-directed, Risk of  Goal: Absence of violence  Outcome: Goal Ongoing

## 2021-04-07 NOTE — Progress Notes
Chart check completed

## 2021-04-07 NOTE — Care Plan
Jamie [name] is compliant with his medications.  Pt denies AH.  Pt has VH of floaters and red and blue dots.  Pt states did not sleep well and appetite is good.  Pt states his mood is fine and is happy.  Pt denies anxiety and depression.  Pt denies pain.  Pt attends some groups and encouraged him to attend groups.  Pt is interacting with peer.  Jamie [name] denies any questions or concerns at this time.    Problem: Health Maintenance - Impaired  Goal: Able to perfom ADL's  Outcome: Goal Ongoing  Goal: Adequate nutritional intake  Outcome: Goal Ongoing  Goal: Establish therapeutic relationships  Outcome: Goal Ongoing  Goal: Knowledge of health maintenance  Outcome: Goal Ongoing     Problem: Mood - Altered  Goal: Stabilize mood  Outcome: Goal Ongoing  Goal: Knowledge of Altered Mood  Outcome: Goal Ongoing     Problem: Self-esteem - Low  Goal: Demonstration of positive self-esteem  Outcome: Goal Ongoing  Goal: Knowledge of low self-esteem  Outcome: Goal Ongoing     Problem: Thought Process - Altered  Goal: Demonstration of organized thought processes  Outcome: Goal Ongoing  Goal: Knowledge of altered thought process  Outcome: Goal Ongoing     Problem: Violence, self/other-directed, Risk of  Goal: Absence of violence  Outcome: Goal Ongoing  Goal: Knowledge of risk for violence, self/other-directed  Outcome: Goal Ongoing     Problem: Transition Readiness  Goal: Knowledge of transition readiness  Outcome: Goal Ongoing     Problem: Cognitive-Perceptual Pattern - Impaired  Goal: Demonstration of organized thought processes  Outcome: Goal Ongoing  Goal: Knowledge of prescribed medication  Outcome: Goal Ongoing     Problem: Coping - Ineffective, Family  Goal: Effective Coping  Outcome: Goal Ongoing  Goal: Communicate with support staff  Outcome: Goal Ongoing  Goal: Knowledge of positive coping methods  Outcome: Goal Ongoing     Problem: Social Interaction - Impaired  Goal: Increased Social Interaction  Outcome: Goal Ongoing  Goal: Knowledge of social interaction  Outcome: Goal Ongoing

## 2021-04-08 NOTE — Progress Notes
Chart check completed.

## 2021-04-08 NOTE — Progress Notes
Chart check completed

## 2021-04-08 NOTE — Care Coordination-Inpatient
Care Coordination - Inpatient Note      Patient Name:   Jamie [name]                        MRN:  [id number]  Admission Date:  03/16/2021    2:38 pm  CM called Pt's mom, [name] Pavel, to provide update and discuss placement progress. CM and [name] discussed the placement process and timeline. [name] is concerned that Pt might not find placement and that he will lose his apartment at the end of June. [name] then asked when she could call and visit Pt. CM provided unit phone number, best times to call, and visiting hours and explained process to schedule visitation. [name] received another call from [employer] and ended phone call with CM. CM will follow up with [facility] as needed.     Jarah [employer]  04/08/2021

## 2021-04-08 NOTE — Progress Notes
After pt ate breakfast, he returned to his room. Pt was pleasant and cooperative upon approach. Pt denies having anxiety/depression/SI/HI/AH. Pt endorse having VH of seeing "stars, green or blue dots." Pt states his goal today is "to get through the day." RN encouraged pt to attend and participate in groups. Pt states he will come out for a couple of groups today.      Pt came out for lunch. Pt approached RN and reports he went to two groups this morning. After lunch, pt returned to his room and laid down. Pt reports he will try to attend a couple more groups today. Pt was given PRN nicorette gum per his request. Will continue to monitor.

## 2021-04-08 NOTE — Behavioral Health Treatment Team
TREATMENT TEAM NOTE  Name: Jamie [name]        MRN: [id number]          DOB: Oct 29, 1996            Age: 25 y.o.  Admission Date: 03/16/2021       LOS: 0 days    Date of Service: 04/08/2021        Attendees:  UR: Ival Bible, LMSW  Nursing: Freddie Apley, RN, BSN  Therapy: Clement Husbands, [place], [facility]  Therapy: Marijean Heath, LMLP, LCP  CM: Henderson Cloud, LMSW  CM: Neysa Bonito, [facility]  Activities Coordinator: Philippa Chester  Pharmacy: Malachi Carl, PharmD  Psychology: Assunta Gambles. Lowry Bowl, PhD  Psychology: Peter Minium, PhD  Psychology: Madie Reno, MS  Psychiatry: Frankey Poot, APRN-NP  Psychiatry Resident: Karie Fetch, MD         Significant Points Discussed: Cm working on level II placement. Participates in groups.     Treatment Plan Discussion: Follow up

## 2021-04-08 NOTE — Progress Notes
PSYCHIATRIC PROGRESS NOTE     LOS: 20 days  Voluntary/Involuntary Status: Voluntary  Guardian: Parents ([name] [place], Kent Wurster)     MEDICATIONS     Current Psychotropic Medications:   1. [name] IM every 28 days (last injection 03/23/2021)  2. Depakote ER 2500 mg QHS     ASSESSMENT & DIAGNOSES     Jamie [name] is a 25 y.o. Caucasian adult with a history of schizoaffective disorder and polysubstance abuse who presented to [facility] as a transfer from OSH with manic symptoms in context of methamphetamine use. This appears to be precipitated by methamphetamine use. Factors that seem to have predisposed him to mania include schizoaffective disorder, substance abuse and uncle with schizophrenia. This current problem is maintained by ongoing substance abuse, lack of consistent medication compliance, and poor insight. However, protective factors include good support from parents who are guardians, motivation to seek treatment, and fair insight into current situation. Proposed treatment will consist of substance detoxification and medication management.    DSM-5 DIAGNOSES:  1. Stimulant use disorder, methamphetamine type, moderate  2. Methamphetamine withdrawal  3. Schizoaffective disorder, bipolar type  4. Tobacco use disorder (chewing tobacco), severe  5. Stimulant use disorder, cocaine type, unspecified severity     PLAN     ? No indications for medication changes on 04/08/21.   ? Received [name] 156 mg IM second dose of initiation on 03/23/2021; next dose: 04/20/2021  ? Continue Depakote ER PTA dose 2500 mg QHS for schizoaffective disorder and mood stabilization  ? Level 1 CARES assessment completed 6/8. Level 1 recommended Level 2 assessment.     Metabolic labs completed on:  Lipid Panel:   LDL   Date Value Ref Range Status   03/19/2021 106 (H) <100 mg/dL Final     HDL   Date Value Ref Range Status   03/19/2021 55 >40 MG/DL Final     VLDL   Date Value Ref Range Status   03/19/2021 15 MG/DL Final Cholesterol   Date Value Ref Range Status   03/19/2021 174 <200 MG/DL Final     Triglycerides   Date Value Ref Range Status   03/19/2021 76 <150 MG/DL Final     Blood Glucose:  Hemoglobin A1C   Date Value Ref Range Status   03/19/2021 5.3 4.0 - 6.0 % Final     Comment:     The ADA recommends that most patients with type 1 and type 2 diabetes maintain   an A1c level <7%.       Disposition: Patient has legal guardian. He is unable to leave AMA unless approved by guardian. Pending evaluation for placement.  ?______________________________________________________________     SUBJECTIVE     No acute events overnight. Per CM Level 1 assessor reached out yesterday, however date of assessment has not been established to date. He denies depression or anxiety. He denies SI/HI endorses baseline VH. He reports he is sleeping well and tolerating his diet.      REVIEW OF SYSTEMS   Review of Systems   Constitutional: Negative for activity change.   HENT: Negative for sore throat.    Eyes: Negative for pain.   Respiratory: Negative for shortness of breath.    Cardiovascular: Negative for chest pain.   Gastrointestinal: Negative for constipation and diarrhea.   Genitourinary: Negative for dysuria.   Musculoskeletal: Negative for myalgias.   Neurological: Negative for dizziness and headaches.   Psychiatric/Behavioral: Negative for agitation.      OBJECTIVE  Vital Signs:  Current                Vital Signs: 24 Hour Range   BP: 104/55 (06/09 0714)  Temp: 36.6 ?C (97.9 ?F) (06/09 1610)  Pulse: 57 (06/09 0714)  Respirations: 16 PER MINUTE (06/09 0714)  SpO2: 97 % (06/09 0714) BP: (104-116)/(55-64)   Temp:  [36.6 ?C (97.9 ?F)]   Pulse:  [57-81]   Respirations:  [16 PER MINUTE]   SpO2:  [97 %]    Intensity Pain Scale (Self Report): (not recorded)      Sleep:  Hours of Sleep: 6.5   Quality of Sleep: Resting Quietly    Scheduled Medications:  divalproex (DEPAKOTE ER) ER tablet 2,500 mg, 2,500 mg, Oral, QHS  nicotine (NICODERM CQ STEP 1) 21 mg/day patch 1 patch, 1 patch, Transdermal, QDAY    PRN Medications:  acetaminophen Q6H PRN, calcium carbonate TID PRN, nicotine polacrilex Q2H PRN 4 mg at 04/07/21 1145, OLANZapine Q6H PRN **OR** OLANZapine Q6H PRN, polyethylene glycol 3350 QDAY PRN, traZODone QHS PRN 50 mg at 04/07/21 2136    Mental Status Exam:  ? General/Constitutional: 25 y.o. adult, appears stated age, dressed in hospital attire, fair hygiene   ? Behavior: calm, cooperative, pleasant  ? Speech/Motor: rrr with fair articulation  ? Eye Contact: good  ? Mood: Fine  ? Affect: Content, mood congruent  ? Thought Process: linear and goal directed  ? Associations: Intact  ? Thought Content: Denies SI/HI. Shows no evidence of delusions or paranoia   ? Perception: reports continued VH, denies AH. Does not appear to be responding to internal stimuli   ? Insight/Judgment: fair/fair     ______________________________________________________________  [name], MD   PGY-2 Psychiatry

## 2021-04-08 NOTE — Care Plan
Problem: Health Maintenance - Impaired  Goal: Able to perfom ADL's  Outcome: Goal Ongoing     Problem: Self-esteem - Low  Goal: Demonstration of positive self-esteem  Outcome: Goal Ongoing  Goal: Knowledge of low self-esteem  Outcome: Goal Ongoing     Problem: Thought Process - Altered  Goal: Demonstration of organized thought processes  Outcome: Goal Ongoing     Problem: Violence, self/other-directed, Risk of  Goal: Absence of violence  Outcome: Goal Ongoing  Goal: Knowledge of risk for violence, self/other-directed  Outcome: Goal Ongoing     Problem: Coping - Ineffective, Family  Goal: Communicate with support staff  Outcome: Goal Ongoing     Problem: Social Interaction - Impaired  Goal: Increased Social Interaction  Outcome: Goal Ongoing  Goal: Knowledge of social interaction  Outcome: Goal Ongoing

## 2021-04-09 MED ORDER — PALIPERIDONE PALMITATE 156 MG/ML IM SYRG
156 mg | INTRAMUSCULAR | 0 refills | Status: DC
Start: 2021-04-09 — End: 2021-06-11
  Administered 2021-04-20 – 2021-05-18 (×2): 156 mg via INTRAMUSCULAR

## 2021-04-09 MED ORDER — PALIPERIDONE PALMITATE 156 MG/ML IM SYRG
156 mg | INTRAMUSCULAR | 0 refills | Status: DC
Start: 2021-04-09 — End: 2021-04-09

## 2021-04-09 NOTE — Care Plan
Problem: Health Maintenance - Impaired  Goal: Able to perfom ADL's  Outcome: Goal Ongoing  Goal: Adequate nutritional intake  Outcome: Goal Ongoing  Goal: Establish therapeutic relationships  Outcome: Goal Ongoing  Goal: Knowledge of health maintenance  Outcome: Goal Ongoing     Problem: Mood - Altered  Goal: Stabilize mood  Outcome: Goal Ongoing  Goal: Knowledge of Altered Mood  Outcome: Goal Ongoing     Problem: Self-esteem - Low  Goal: Demonstration of positive self-esteem  Outcome: Goal Ongoing  Goal: Knowledge of low self-esteem  Outcome: Goal Ongoing     Problem: Thought Process - Altered  Goal: Demonstration of organized thought processes  Outcome: Goal Ongoing  Goal: Knowledge of altered thought process  Outcome: Goal Ongoing     Problem: Violence, self/other-directed, Risk of  Goal: Absence of violence  Outcome: Goal Ongoing  Goal: Knowledge of risk for violence, self/other-directed  Outcome: Goal Ongoing     Problem: Transition Readiness  Goal: Knowledge of transition readiness  Outcome: Goal Ongoing     Problem: Cognitive-Perceptual Pattern - Impaired  Goal: Demonstration of organized thought processes  Outcome: Goal Ongoing  Goal: Knowledge of prescribed medication  Outcome: Goal Ongoing     Problem: Coping - Ineffective, Family  Goal: Effective Coping  Outcome: Goal Ongoing  Goal: Communicate with support staff  Outcome: Goal Ongoing  Goal: Knowledge of positive coping methods  Outcome: Goal Ongoing     Problem: Social Interaction - Impaired  Goal: Increased Social Interaction  Outcome: Goal Ongoing  Goal: Knowledge of social interaction  Outcome: Goal Ongoing

## 2021-04-09 NOTE — Progress Notes
Patient was calm and cooperative. Present in milieu having appropriate interactions with staff and peers. Medication compliant. Endorses VH of stars and dots, denies si/hi and AH. Currently sleeping in room.

## 2021-04-09 NOTE — Progress Notes
Chart check completed.

## 2021-04-09 NOTE — Progress Notes
0037  Patient was calm, cooperative and compliant. He denied SI/HI/AH/depression/anxiety/pain/paranoia. Patient endorsed VH but shared that it does not make him scared. Patient shared that he slept alright last night. He has had appropriate interactions with staff and peers and is able to state his needs adequately.

## 2021-04-09 NOTE — Progress Notes
PSYCHIATRIC PROGRESS NOTE     LOS: 20 days  Voluntary/Involuntary Status: Voluntary  Guardian: Parents ([name] [place], Kent Schrier)     MEDICATIONS     Current Psychotropic Medications:   1. [name] IM every 28 days (last injection 03/23/2021)  2. Depakote ER 2500 mg QHS     ASSESSMENT & DIAGNOSES     Jamie [name] is a 25 y.o. Caucasian adult with a history of schizoaffective disorder and polysubstance abuse who presented to [facility] as a transfer from OSH with manic symptoms in context of methamphetamine use. This appears to be precipitated by methamphetamine use. Factors that seem to have predisposed him to mania include schizoaffective disorder, substance abuse and uncle with schizophrenia. This current problem is maintained by ongoing substance abuse, lack of consistent medication compliance, and poor insight. However, protective factors include good support from parents who are guardians, motivation to seek treatment, and fair insight into current situation. Proposed treatment will consist of substance detoxification and medication management.    DSM-5 DIAGNOSES:  1. Stimulant use disorder, methamphetamine type, moderate  2. Methamphetamine withdrawal  3. Schizoaffective disorder, bipolar type  4. Tobacco use disorder (chewing tobacco), severe  5. Stimulant use disorder, cocaine type, unspecified severity     PLAN     ? No indications for medication changes on 04/09/21.   ? Received [name] 156 mg IM second dose of initiation on 03/23/2021; next dose: 04/20/2021  ? Continue Depakote ER PTA dose 2500 mg QHS for schizoaffective disorder and mood stabilization  ? Level 1 CARES assessment completed 6/8. Level 1 recommended Level 2 assessment.     Metabolic labs completed on:  Lipid Panel:   LDL   Date Value Ref Range Status   03/19/2021 106 (H) <100 mg/dL Final     HDL   Date Value Ref Range Status   03/19/2021 55 >40 MG/DL Final     VLDL   Date Value Ref Range Status   03/19/2021 15 MG/DL Final Cholesterol   Date Value Ref Range Status   03/19/2021 174 <200 MG/DL Final     Triglycerides   Date Value Ref Range Status   03/19/2021 76 <150 MG/DL Final     Blood Glucose:  Hemoglobin A1C   Date Value Ref Range Status   03/19/2021 5.3 4.0 - 6.0 % Final     Comment:     The ADA recommends that most patients with type 1 and type 2 diabetes maintain   an A1c level <7%.       Disposition: Patient has legal guardian. He is unable to leave AMA unless approved by guardian. Pending evaluation for placement.  ?______________________________________________________________     SUBJECTIVE     No acute events overnight. Per CM Level 1 assessor reached out yesterday, however date of assessment has not been established to date. He denies depression or anxiety. He denies SI/HI endorses baseline VH. He reports he is sleeping well and tolerating his diet.      REVIEW OF SYSTEMS   Review of Systems   Constitutional: Negative for activity change.   HENT: Negative for sore throat.    Eyes: Negative for pain.   Respiratory: Negative for shortness of breath.    Cardiovascular: Negative for chest pain.   Gastrointestinal: Negative for constipation and diarrhea.   Genitourinary: Negative for dysuria.   Musculoskeletal: Negative for myalgias.   Neurological: Negative for dizziness and headaches.   Psychiatric/Behavioral: Negative for agitation.      OBJECTIVE  Vital Signs:  Current                Vital Signs: 24 Hour Range   BP: 111/63 (06/10 0700)  Temp: 36.6 ?C (97.8 ?F) (06/10 0700)  Pulse: 51 (06/10 0700)  Respirations: 16 PER MINUTE (06/10 0700)  SpO2: 96 % (06/10 0700) BP: (111-130)/(63-71)   Temp:  [36.6 ?C (97.8 ?F)-37.1 ?C (98.8 ?F)]   Pulse:  [51-85]   Respirations:  [16 PER MINUTE]   SpO2:  [96 %-98 %]    Intensity Pain Scale (Self Report): (not recorded)      Sleep:  Hours of Sleep: 7.5   Quality of Sleep: Resting Quietly    Scheduled Medications:  divalproex (DEPAKOTE ER) ER tablet 2,500 mg, 2,500 mg, Oral, QHS  nicotine (NICODERM CQ STEP 1) 21 mg/day patch 1 patch, 1 patch, Transdermal, QDAY    PRN Medications:  acetaminophen Q6H PRN, calcium carbonate TID PRN, nicotine polacrilex Q2H PRN 4 mg at 04/08/21 2141, OLANZapine Q6H PRN **OR** OLANZapine Q6H PRN, polyethylene glycol 3350 QDAY PRN, traZODone QHS PRN 50 mg at 04/08/21 2141    Mental Status Exam:  ? General/Constitutional: 25 y.o. adult, appears stated age, dressed in hospital attire, fair hygiene   ? Behavior: calm, cooperative, pleasant  ? Speech/Motor: rrr with fair articulation  ? Eye Contact: good  ? Mood: Fine  ? Affect: Content, mood congruent  ? Thought Process: linear and goal directed  ? Associations: Intact  ? Thought Content: Denies SI/HI. Shows no evidence of delusions or paranoia   ? Perception: reports continued VH, denies AH. Does not appear to be responding to internal stimuli   ? Insight/Judgment: fair/fair     ______________________________________________________________  [name], MD   PGY-2 Psychiatry

## 2021-04-09 NOTE — Progress Notes
Chart check complete

## 2021-04-09 NOTE — Behavioral Health Treatment Team
TREATMENT TEAM NOTE  Name: Jamie [name]        MRN: [id number]          DOB: Jun 06, 1996            Age: 25 y.o.  Admission Date: 03/16/2021       LOS: 0 days    Date of Service: 04/09/2021        Attendees:  UR: Ival Bible, LMSW  Nursing: Flonnie Overman, RN, BSN  Therapy: Clement Husbands, [place], [facility]  Therapy: Marijean Heath, LMLP, LCP  CM: Berle Mull, MTBC, LPC  CM: Henderson Cloud, LMSW  CM: Neysa Bonito, [facility]  Activities Coordinator: Philippa Chester  Pharmacy: Malachi Carl, PharmD  Psychiatry: Frankey Poot, APRN-NP  Psychiatry Resident: Karie Fetch, [employer]   Psychiatry Resident: Milana Kidney, DO         Significant Points Discussed: Attending groups, participating appropriately. CM discussing placement procedure with mom, conversation went well.    Treatment Plan Discussion: Cont treatment

## 2021-04-10 NOTE — Progress Notes
PSYCHIATRIC PROGRESS NOTE     LOS: 20 days  Voluntary/Involuntary Status: Voluntary  Guardian: Parents ([name] [place], Kent Heffron)     MEDICATIONS     Current Psychotropic Medications:   1. [name] IM every 28 days (last injection 03/23/2021)  2. Depakote ER 2500 mg QHS     ASSESSMENT & DIAGNOSES     Jamie [name] is a 25 y.o. Caucasian adult with a history of schizoaffective disorder and polysubstance abuse who presented to [facility] as a transfer from OSH with manic symptoms in context of methamphetamine use. This appears to be precipitated by methamphetamine use. Factors that seem to have predisposed him to mania include schizoaffective disorder, substance abuse and uncle with schizophrenia. This current problem is maintained by ongoing substance abuse, lack of consistent medication compliance, and poor insight. However, protective factors include good support from parents who are guardians, motivation to seek treatment, and fair insight into current situation. Proposed treatment will consist of substance detoxification and medication management.    DSM-5 DIAGNOSES:  1. Stimulant use disorder, methamphetamine type, moderate  2. Methamphetamine withdrawal  3. Schizoaffective disorder, bipolar type  4. Tobacco use disorder (chewing tobacco), severe  5. Stimulant use disorder, cocaine type, unspecified severity     PLAN     ? No indications for medication changes on 04/10/21.   ? Received [name] 156 mg IM second dose of initiation on 03/23/2021; next dose: 04/20/2021  ? Continue Depakote ER PTA dose 2500 mg QHS for schizoaffective disorder and mood stabilization  ? Level 1 CARES assessment completed 6/8. Level 1 recommended Level 2 assessment.     Metabolic labs completed on:  Lipid Panel:   LDL   Date Value Ref Range Status   03/19/2021 106 (H) <100 mg/dL Final     HDL   Date Value Ref Range Status   03/19/2021 55 >40 MG/DL Final     VLDL   Date Value Ref Range Status   03/19/2021 15 MG/DL Final Cholesterol   Date Value Ref Range Status   03/19/2021 174 <200 MG/DL Final     Triglycerides   Date Value Ref Range Status   03/19/2021 76 <150 MG/DL Final     Blood Glucose:  Hemoglobin A1C   Date Value Ref Range Status   03/19/2021 5.3 4.0 - 6.0 % Final     Comment:     The ADA recommends that most patients with type 1 and type 2 diabetes maintain   an A1c level <7%.       Disposition: Patient has legal guardian. He is unable to leave AMA unless approved by guardian. Pending evaluation for placement.  ?______________________________________________________________     SUBJECTIVE     No acute events overnight. Per CM Level 1 assessor reached out yesterday, however date of assessment has not been established to date. He denies depression or anxiety. He denies SI/HI endorses baseline VH. He reports he is sleeping well and tolerating his diet.      REVIEW OF SYSTEMS   Review of Systems   Constitutional: Negative for activity change.   HENT: Negative for sore throat.    Eyes: Negative for pain.   Respiratory: Negative for shortness of breath.    Cardiovascular: Negative for chest pain.   Gastrointestinal: Negative for constipation and diarrhea.   Genitourinary: Negative for dysuria.   Musculoskeletal: Negative for myalgias.   Neurological: Negative for dizziness and headaches.   Psychiatric/Behavioral: Negative for agitation.      OBJECTIVE  Vital Signs:  Current                Vital Signs: 24 Hour Range   BP: 111/55 (06/11 0700)  Temp: 36.7 ?C (98 ?F) (06/11 0700)  Pulse: 60 (06/11 0700)  Respirations: 16 PER MINUTE (06/11 0700)  SpO2: 97 % (06/11 0700) BP: (111-129)/(55-71)   Temp:  [36.7 ?C (98 ?F)-37 ?C (98.6 ?F)]   Pulse:  [60-80]   Respirations:  [16 PER MINUTE-19 PER MINUTE]   SpO2:  [97 %-98 %]    Intensity Pain Scale (Self Report): (not recorded)      Sleep:  Hours of Sleep: 6.0   Quality of Sleep: Resting Quietly    Scheduled Medications:  divalproex (DEPAKOTE ER) ER tablet 2,500 mg, 2,500 mg, Oral, QHS  nicotine (NICODERM CQ STEP 1) 21 mg/day patch 1 patch, 1 patch, Transdermal, QDAY  [START ON 04/20/2021] paliperidone palmitate (INVEGA) (+) injection 156 mg, 156 mg, Intramuscular, Q28 Days (4 weeks)    PRN Medications:  acetaminophen Q6H PRN, calcium carbonate TID PRN, nicotine polacrilex Q2H PRN 4 mg at 04/09/21 2205, OLANZapine Q6H PRN **OR** OLANZapine Q6H PRN, polyethylene glycol 3350 QDAY PRN, traZODone QHS PRN 50 mg at 04/09/21 2205    Mental Status Exam:  ? General/Constitutional: 25 y.o. adult, appears stated age, dressed in hospital attire, fair hygiene   ? Behavior: calm, cooperative, pleasant  ? Speech/Motor: rrr with fair articulation  ? Eye Contact: good  ? Mood: Fine  ? Affect: Content, mood congruent  ? Thought Process: linear and goal directed  ? Associations: Intact  ? Thought Content: Denies SI/HI. Shows no evidence of delusions or paranoia   ? Perception: reports continued VH, at baseline, denies AH. Does not appear to be responding to internal stimuli   ? Insight/Judgment: fair/fair     ______________________________________________________________  [name], MD   PGY-2 Psychiatry

## 2021-04-10 NOTE — Care Plan
Patient visible in milieu interacting with staff and peers appropriately. Denies SI/HI/AH, states VH is "the same as it always is." Compliant with morning medications and cooperative with care. Will continue to monitor.   Problem: Health Maintenance - Impaired  Goal: Able to perfom ADL's  Outcome: Goal Ongoing  Goal: Adequate nutritional intake  Outcome: Goal Ongoing  Goal: Establish therapeutic relationships  Outcome: Goal Ongoing  Goal: Knowledge of health maintenance  Outcome: Goal Ongoing     Problem: Mood - Altered  Goal: Stabilize mood  Outcome: Goal Ongoing  Goal: Knowledge of Altered Mood  Outcome: Goal Ongoing     Problem: Self-esteem - Low  Goal: Demonstration of positive self-esteem  Outcome: Goal Ongoing  Goal: Knowledge of low self-esteem  Outcome: Goal Ongoing     Problem: Thought Process - Altered  Goal: Demonstration of organized thought processes  Outcome: Goal Ongoing  Goal: Knowledge of altered thought process  Outcome: Goal Ongoing     Problem: Violence, self/other-directed, Risk of  Goal: Absence of violence  Outcome: Goal Ongoing  Goal: Knowledge of risk for violence, self/other-directed  Outcome: Goal Ongoing     Problem: Transition Readiness  Goal: Knowledge of transition readiness  Outcome: Goal Ongoing     Problem: Cognitive-Perceptual Pattern - Impaired  Goal: Demonstration of organized thought processes  Outcome: Goal Ongoing  Goal: Knowledge of prescribed medication  Outcome: Goal Ongoing     Problem: Coping - Ineffective, Family  Goal: Effective Coping  Outcome: Goal Ongoing  Goal: Communicate with support staff  Outcome: Goal Ongoing  Goal: Knowledge of positive coping methods  Outcome: Goal Ongoing     Problem: Social Interaction - Impaired  Goal: Increased Social Interaction  Outcome: Goal Ongoing  Goal: Knowledge of social interaction  Outcome: Goal Ongoing

## 2021-04-10 NOTE — Progress Notes
Chart check completed

## 2021-04-10 NOTE — Care Plan
Patient denies pain, anxiety, depression, SI/HI/AH. Pt endorses VH of stars, floaters, and colored dots. Mood and behavior have been stable. Pt has been calm and cooperative. Pt out in the milieu, watching TV, and interacting with peers. Pt compliant with HS medications and evening snack. Pt remains on 15 minute checks for safety.       Problem: Health Maintenance - Impaired  Goal: Able to perfom ADL's  Outcome: Goal Ongoing  Goal: Adequate nutritional intake  Outcome: Goal Ongoing  Goal: Establish therapeutic relationships  Outcome: Goal Ongoing  Goal: Knowledge of health maintenance  Outcome: Goal Ongoing     Problem: Mood - Altered  Goal: Stabilize mood  Outcome: Goal Ongoing  Goal: Knowledge of Altered Mood  Outcome: Goal Ongoing     Problem: Self-esteem - Low  Goal: Demonstration of positive self-esteem  Outcome: Goal Ongoing  Goal: Knowledge of low self-esteem  Outcome: Goal Ongoing     Problem: Thought Process - Altered  Goal: Demonstration of organized thought processes  Outcome: Goal Ongoing  Goal: Knowledge of altered thought process  Outcome: Goal Ongoing     Problem: Violence, self/other-directed, Risk of  Goal: Absence of violence  Outcome: Goal Ongoing  Goal: Knowledge of risk for violence, self/other-directed  Outcome: Goal Ongoing     Problem: Transition Readiness  Goal: Knowledge of transition readiness  Outcome: Goal Ongoing     Problem: Cognitive-Perceptual Pattern - Impaired  Goal: Demonstration of organized thought processes  Outcome: Goal Ongoing  Goal: Knowledge of prescribed medication  Outcome: Goal Ongoing     Problem: Coping - Ineffective, Family  Goal: Effective Coping  Outcome: Goal Ongoing  Goal: Communicate with support staff  Outcome: Goal Ongoing  Goal: Knowledge of positive coping methods  Outcome: Goal Ongoing     Problem: Social Interaction - Impaired  Goal: Increased Social Interaction  Outcome: Goal Ongoing  Goal: Knowledge of social interaction  Outcome: Goal Ongoing

## 2021-04-11 NOTE — Progress Notes
Chart check completed.

## 2021-04-11 NOTE — Progress Notes
PSYCHIATRIC PROGRESS NOTE     LOS: 20 days  Voluntary/Involuntary Status: Voluntary  Guardian: Parents ([name] [place], Kent Venning)     MEDICATIONS     Current Psychotropic Medications:   1. [name] IM every 28 days (last injection 03/23/2021)  2. Depakote ER 2500 mg QHS     ASSESSMENT & DIAGNOSES     Jamie [name] is a 25 y.o. Caucasian adult with a history of schizoaffective disorder and polysubstance abuse who presented to [facility] as a transfer from OSH with manic symptoms in context of methamphetamine use. This appears to be precipitated by methamphetamine use. Factors that seem to have predisposed him to mania include schizoaffective disorder, substance abuse and uncle with schizophrenia. This current problem is maintained by ongoing substance abuse, lack of consistent medication compliance, and poor insight. However, protective factors include good support from parents who are guardians, motivation to seek treatment, and fair insight into current situation. Proposed treatment will consist of substance detoxification and medication management.    DSM-5 DIAGNOSES:  1. Stimulant use disorder, methamphetamine type, moderate  2. Methamphetamine withdrawal  3. Schizoaffective disorder, bipolar type  4. Tobacco use disorder (chewing tobacco), severe  5. Stimulant use disorder, cocaine type, unspecified severity    Discussed with Dr. Hortense Ramal     PLAN     ? No indications for medication changes on 04/11/21.   ? Received [name] 156 mg IM second dose of initiation on 03/23/2021; next dose: 04/20/2021  ? Continue Depakote ER PTA dose 2500 mg QHS for schizoaffective disorder and mood stabilization  ? Level 1 CARES assessment completed 6/8. Level 1 recommended Level 2 assessment.     Metabolic labs completed on:  Lipid Panel:   LDL   Date Value Ref Range Status   03/19/2021 106 (H) <100 mg/dL Final     HDL   Date Value Ref Range Status   03/19/2021 55 >40 MG/DL Final     VLDL   Date Value Ref Range Status   03/19/2021 15 MG/DL Final     Cholesterol   Date Value Ref Range Status   03/19/2021 174 <200 MG/DL Final     Triglycerides   Date Value Ref Range Status   03/19/2021 76 <150 MG/DL Final     Blood Glucose:  Hemoglobin A1C   Date Value Ref Range Status   03/19/2021 5.3 4.0 - 6.0 % Final     Comment:     The ADA recommends that most patients with type 1 and type 2 diabetes maintain   an A1c level <7%.       Disposition: Patient has legal guardian. He is unable to leave AMA unless approved by guardian. Pending evaluation for placement.  ?______________________________________________________________     Jamie [name] was seen and evaluated this morning.  He denies any acute psychiatric concerns other than frustration with still being in the hospital, but not overly distressed by this.  He expresses understanding about the placement process taking a long time.  Overall he is happy to continue admission until he can discharge safely.  Denies any manic or psychotic symptoms, denies SI/HI/AVH.  Asked if he could go back to sleep as he felt tired this morning.  No medication side effects per patient report     REVIEW OF SYSTEMS   Review of Systems   Constitutional: Negative for activity change.   HENT: Negative for sore throat.    Eyes: Negative for pain.   Respiratory:  Negative for shortness of breath.    Cardiovascular: Negative for chest pain.   Gastrointestinal: Negative for constipation and diarrhea.   Genitourinary: Negative for dysuria.   Musculoskeletal: Negative for myalgias.   Neurological: Negative for dizziness and headaches.   Psychiatric/Behavioral: Negative for agitation.      OBJECTIVE                    Vital Signs:  Current                Vital Signs: 24 Hour Range   BP: 104/54 (06/12 0700)  Temp: 36.7 ?C (98.1 ?F) (06/12 0700)  Pulse: 60 (06/12 0700)  Respirations: 18 PER MINUTE (06/12 0700)  SpO2: 97 % (06/12 0700) BP: (104-142)/(54-71)   Temp:  [36.6 ?C (97.9 ?F)-36.7 ?C (98.1 ?F)] Pulse:  [60-79]   Respirations:  [16 PER MINUTE-18 PER MINUTE]   SpO2:  [97 %]    Intensity Pain Scale (Self Report): (not recorded)      Sleep:  Hours of Sleep: 6.75   Quality of Sleep: Resting Quietly    Scheduled Medications:  divalproex (DEPAKOTE ER) ER tablet 2,500 mg, 2,500 mg, Oral, QHS  nicotine (NICODERM CQ STEP 1) 21 mg/day patch 1 patch, 1 patch, Transdermal, QDAY  [START ON 04/20/2021] paliperidone palmitate (INVEGA) (+) injection 156 mg, 156 mg, Intramuscular, Q28 Days (4 weeks)    PRN Medications:  acetaminophen Q6H PRN, calcium carbonate TID PRN, nicotine polacrilex Q2H PRN 4 mg at 04/10/21 1921, OLANZapine Q6H PRN **OR** OLANZapine Q6H PRN, polyethylene glycol 3350 QDAY PRN, traZODone QHS PRN 50 mg at 04/10/21 2115    Mental Status Exam:  ? General/Constitutional: 25 y.o. adult, appears stated age, dressed in hospital attire, fair hygiene   ? Behavior: calm, cooperative, pleasant  ? Speech/Motor: rrr with fair articulation  ? Eye Contact: good  ? Mood: All right  ? Affect: Content, mood congruent  ? Thought Process: linear and goal directed  ? Associations: Intact  ? Thought Content: Denies SI/HI. Shows no evidence of delusions or paranoia   ? Perception: reports continued VH, at baseline, denies AH. Does not appear to be responding to internal stimuli   ? Insight/Judgment: fair/fair     ______________________________________________________________  Jamie Putnam, DO

## 2021-04-11 NOTE — Care Plan
Pt is calm, cooperative and compliant.  He has isolated some today, out for meals, showered .  He denies SI AVH.  States he is ready to be discharged.  He is aware of the placement process.  He is appropriate with staff and peers.    Problem: Health Maintenance - Impaired  Goal: Adequate nutritional intake  Outcome: Goal Ongoing  Goal: Establish therapeutic relationships  Outcome: Goal Ongoing  Goal: Knowledge of health maintenance  Outcome: Goal Ongoing     Problem: Mood - Altered  Goal: Stabilize mood  Outcome: Goal Ongoing  Goal: Knowledge of Altered Mood  Outcome: Goal Ongoing     Problem: Self-esteem - Low  Goal: Demonstration of positive self-esteem  Outcome: Goal Ongoing  Goal: Knowledge of low self-esteem  Outcome: Goal Ongoing     Problem: Thought Process - Altered  Goal: Demonstration of organized thought processes  Outcome: Goal Ongoing  Goal: Knowledge of altered thought process  Outcome: Goal Ongoing     Problem: Violence, self/other-directed, Risk of  Goal: Absence of violence  Outcome: Goal Ongoing  Goal: Knowledge of risk for violence, self/other-directed  Outcome: Goal Ongoing     Problem: Transition Readiness  Goal: Knowledge of transition readiness  Outcome: Goal Ongoing     Problem: Cognitive-Perceptual Pattern - Impaired  Goal: Demonstration of organized thought processes  Outcome: Goal Ongoing  Goal: Knowledge of prescribed medication  Outcome: Goal Ongoing     Problem: Coping - Ineffective, Family  Goal: Effective Coping  Outcome: Goal Ongoing  Goal: Communicate with support staff  Outcome: Goal Ongoing  Goal: Knowledge of positive coping methods  Outcome: Goal Ongoing     Problem: Social Interaction - Impaired  Goal: Increased Social Interaction  Outcome: Goal Ongoing  Goal: Knowledge of social interaction  Outcome: Goal Ongoing

## 2021-04-11 NOTE — Care Plan
Patient calm, cooperative, and follows commands. Patient spent most of the evening in the day room watching television and socializing with peers. Patient medication compliant. Patient denies SI/HI/AH. Patient endorses VH and states it has not gotten better. No medical concerns at this time. Vitals stable. Will continue to monitor per protocol.       Problem: Health Maintenance - Impaired  Goal: Able to perfom ADL's  Outcome: Goal Ongoing  Goal: Adequate nutritional intake  Outcome: Goal Ongoing  Goal: Establish therapeutic relationships  Outcome: Goal Ongoing  Goal: Knowledge of health maintenance  Outcome: Goal Ongoing     Problem: Mood - Altered  Goal: Stabilize mood  Outcome: Goal Ongoing  Goal: Knowledge of Altered Mood  Outcome: Goal Ongoing     Problem: Self-esteem - Low  Goal: Demonstration of positive self-esteem  Outcome: Goal Ongoing  Goal: Knowledge of low self-esteem  Outcome: Goal Ongoing     Problem: Thought Process - Altered  Goal: Demonstration of organized thought processes  Outcome: Goal Ongoing  Goal: Knowledge of altered thought process  Outcome: Goal Ongoing     Problem: Violence, self/other-directed, Risk of  Goal: Absence of violence  Outcome: Goal Ongoing  Goal: Knowledge of risk for violence, self/other-directed  Outcome: Goal Ongoing     Problem: Transition Readiness  Goal: Knowledge of transition readiness  Outcome: Goal Ongoing     Problem: Cognitive-Perceptual Pattern - Impaired  Goal: Demonstration of organized thought processes  Outcome: Goal Ongoing  Goal: Knowledge of prescribed medication  Outcome: Goal Ongoing     Problem: Coping - Ineffective, Family  Goal: Effective Coping  Outcome: Goal Ongoing  Goal: Communicate with support staff  Outcome: Goal Ongoing  Goal: Knowledge of positive coping methods  Outcome: Goal Ongoing     Problem: Social Interaction - Impaired  Goal: Increased Social Interaction  Outcome: Goal Ongoing  Goal: Knowledge of social interaction  Outcome: Goal Ongoing

## 2021-04-12 NOTE — Progress Notes
Chart check complete.

## 2021-04-12 NOTE — Care Plan
The patient spent the evening in the green zone piano room watching TV alone. He denies anxiety depression, pain, SI, and HI. He endorses ongoing hallucinations, describing them as floaters and colored dots. He was calm, cooperative, and compliant with his scheduled HS medications.       Problem: Health Maintenance - Impaired  Goal: Adequate nutritional intake  04/11/2021 2357 by Christ Kick, RN  Outcome: Goal Ongoing  04/11/2021 2310 by Christ Kick, RN  Outcome: Goal Ongoing  Goal: Establish therapeutic relationships  04/11/2021 2357 by Christ Kick, RN  Outcome: Goal Ongoing  04/11/2021 2310 by Christ Kick, RN  Outcome: Goal Ongoing  Goal: Knowledge of health maintenance  04/11/2021 2357 by Christ Kick, RN  Outcome: Goal Ongoing  04/11/2021 2310 by Christ Kick, RN  Outcome: Goal Ongoing     Problem: Mood - Altered  Goal: Stabilize mood  04/11/2021 2357 by Christ Kick, RN  Outcome: Goal Ongoing  04/11/2021 2310 by Christ Kick, RN  Outcome: Goal Ongoing  Goal: Knowledge of Altered Mood  04/11/2021 2357 by Christ Kick, RN  Outcome: Goal Ongoing  04/11/2021 2310 by Christ Kick, RN  Outcome: Goal Ongoing     Problem: Self-esteem - Low  Goal: Demonstration of positive self-esteem  04/11/2021 2357 by Christ Kick, RN  Outcome: Goal Ongoing  04/11/2021 2310 by Christ Kick, RN  Outcome: Goal Ongoing  Goal: Knowledge of low self-esteem  04/11/2021 2357 by Christ Kick, RN  Outcome: Goal Ongoing  04/11/2021 2310 by Christ Kick, RN  Outcome: Goal Ongoing     Problem: Thought Process - Altered  Goal: Demonstration of organized thought processes  04/11/2021 2357 by Christ Kick, RN  Outcome: Goal Ongoing  04/11/2021 2310 by Christ Kick, RN  Outcome: Goal Ongoing  Goal: Knowledge of altered thought process  04/11/2021 2357 by Christ Kick, RN  Outcome: Goal Ongoing  04/11/2021 2310 by Christ Kick, RN  Outcome: Goal Ongoing     Problem: Violence, self/other-directed, Risk of  Goal: Absence of violence  04/11/2021 2357 by Christ Kick, RN  Outcome: Goal Ongoing  04/11/2021 2310 by Christ Kick, RN  Outcome: Goal Ongoing  Goal: Knowledge of risk for violence, self/other-directed  04/11/2021 2357 by Christ Kick, RN  Outcome: Goal Ongoing  04/11/2021 2310 by Christ Kick, RN  Outcome: Goal Ongoing     Problem: Transition Readiness  Goal: Knowledge of transition readiness  04/11/2021 2357 by Christ Kick, RN  Outcome: Goal Ongoing  04/11/2021 2310 by Christ Kick, RN  Outcome: Goal Ongoing     Problem: Cognitive-Perceptual Pattern - Impaired  Goal: Demonstration of organized thought processes  04/11/2021 2357 by Christ Kick, RN  Outcome: Goal Ongoing  04/11/2021 2310 by Christ Kick, RN  Outcome: Goal Ongoing  Goal: Knowledge of prescribed medication  04/11/2021 2357 by Christ Kick, RN  Outcome: Goal Ongoing  04/11/2021 2310 by Christ Kick, RN  Outcome: Goal Ongoing     Problem: Coping - Ineffective, Family  Goal: Effective Coping  04/11/2021 2357 by Christ Kick, RN  Outcome: Goal Ongoing  04/11/2021 2310 by Christ Kick, RN  Outcome: Goal Ongoing  Goal: Communicate with support staff  04/11/2021 2357 by Christ Kick, RN  Outcome: Goal Ongoing  04/11/2021 2310 by Christ Kick, RN  Outcome: Goal Ongoing  Goal: Knowledge of positive coping methods  04/11/2021 2357 by Christ Kick, RN  Outcome: Goal Ongoing  04/11/2021 2310 by Christ Kick, RN  Outcome: Goal Ongoing     Problem: Social Interaction - Impaired  Goal:  Increased Social Interaction  04/11/2021 2357 by Christ Kick, RN  Outcome: Goal Ongoing  04/11/2021 2310 by Christ Kick, RN  Outcome: Goal Ongoing  Goal: Knowledge of social interaction  04/11/2021 2357 by Christ Kick, RN  Outcome: Goal Ongoing  04/11/2021 2310 by Christ Kick, RN  Outcome: Goal Ongoing

## 2021-04-12 NOTE — Progress Notes
Psychology [phone])    Service(s): Individual Counseling and Psychotherapy    Presentation: Met withJosiahto provide individual support. He reported that he was evaluated for Level 2 care and will be leaving to be in a Level 2 setting. He was given some space to process his feelings about this next step in his treatment. He was not particularly excited about his impending placement but was able to articulate ways that it may be a positive experience for him. He stated that he wants to be able to work and save money and is curious about how his placement will impede or facilitate his goals and progress. We discussed Jamie [name]'s strengths and that he has the ability to reframe his upcoming placement in positive terms. Jamie [name] was well-engaged and demonstrated good overall insight to his situation.     MSE: Casually dressed and groomed, A+Ox3, mood "pretty goodfair range of affect, euthymic, appropriate humor,eye contact was WNL, speech WNL, TP linear and goal-directed, TC was negative forSI,HI, AVH, insight and judgement were fair to good, actively engaged in session.    Psychotherapy: Utilized motivational interviewing and CBT principles, particularly cognitive re-framing, to explore alternative perspectives and explore how he might improve his circumstances and adaptive functioning. Encouraged perspective taking via mentalization-based interventions.     Plan: Will continue to follow    Winifred Olive, Ph.D.   Psychology Postdoctoral Fellow  Pager [phone]  [place]

## 2021-04-12 NOTE — Progress Notes
PSYCHIATRIC PROGRESS NOTE     LOS: 20 days  Voluntary/Involuntary Status: Voluntary  Guardian: Parents ([name] [place], Kent Puig)     MEDICATIONS     Current Psychotropic Medications:   1. [name] IM every 28 days (last injection 03/23/2021)  2. Depakote ER 2500 mg QHS     ASSESSMENT & DIAGNOSES     Jamie [name] is a 25 y.o. Caucasian adult with a history of schizoaffective disorder and polysubstance abuse who presented to [facility] as a transfer from OSH with manic symptoms in context of methamphetamine use. This appears to be precipitated by methamphetamine use. Factors that seem to have predisposed him to mania include schizoaffective disorder, substance abuse and uncle with schizophrenia. This current problem is maintained by ongoing substance abuse, lack of consistent medication compliance, and poor insight. However, protective factors include good support from parents who are guardians, motivation to seek treatment, and fair insight into current situation. Proposed treatment will consist of substance detoxification and medication management.    DSM-5 DIAGNOSES:  1. Stimulant use disorder, methamphetamine type, moderate  2. Methamphetamine withdrawal  3. Schizoaffective disorder, bipolar type  4. Tobacco use disorder (chewing tobacco), severe  5. Stimulant use disorder, cocaine type, unspecified severity    Discussed with Dr. Sherrine Maples.     PLAN     ? No indications for medication changes on 04/12/21.   ? Received [name] 156 mg IM second dose of initiation on 03/23/2021; next dose: 04/20/2021  ? Continue Depakote ER PTA dose 2500 mg QHS for schizoaffective disorder and mood stabilization  ? Level 1 CARES assessment completed 6/8. Level 1 recommended Level 2 assessment.     Metabolic labs completed on:  Lipid Panel:   LDL   Date Value Ref Range Status   03/19/2021 106 (H) <100 mg/dL Final     HDL   Date Value Ref Range Status   03/19/2021 55 >40 MG/DL Final     VLDL   Date Value Ref Range Status 03/19/2021 15 MG/DL Final     Cholesterol   Date Value Ref Range Status   03/19/2021 174 <200 MG/DL Final     Triglycerides   Date Value Ref Range Status   03/19/2021 76 <150 MG/DL Final     Blood Glucose:  Hemoglobin A1C   Date Value Ref Range Status   03/19/2021 5.3 4.0 - 6.0 % Final     Comment:     The ADA recommends that most patients with type 1 and type 2 diabetes maintain   an A1c level <7%.       Disposition: Patient has legal guardian. He is unable to leave AMA unless approved by guardian. Pending evaluation for placement.  ?______________________________________________________________     Jamie [name] was seen and evaluated this morning.  He denies any acute psychiatric concerns other than frustration with still being in the hospital, but not overly distressed by this.  He expresses understanding about the placement process taking a long time.  Overall he is happy to continue admission until he can discharge safely.  Denies any manic or psychotic symptoms, denies SI/HI/AVH.  Asked if he could go back to sleep as he felt tired this morning.  No medication side effects per patient report     REVIEW OF SYSTEMS   Review of Systems   Constitutional: Negative for activity change.   HENT: Negative for sore throat.    Eyes: Negative for pain.   Respiratory: Negative for  shortness of breath.    Cardiovascular: Negative for chest pain.   Gastrointestinal: Negative for constipation and diarrhea.   Genitourinary: Negative for dysuria.   Musculoskeletal: Negative for myalgias.   Neurological: Negative for dizziness and headaches.   Psychiatric/Behavioral: Negative for agitation.      OBJECTIVE                    Vital Signs:  Current                Vital Signs: 24 Hour Range   BP: 114/65 (06/12 1915)  Temp: 36.8 ?C (98.2 ?F) (06/12 1915)  Pulse: 75 (06/12 1915)  Respirations: 18 PER MINUTE (06/12 1915)  SpO2: 95 % (06/12 1915) BP: (114)/(65)   Temp:  [36.8 ?C (98.2 ?F)]   Pulse:  [75]   Respirations:  [18 PER MINUTE]   SpO2:  [95 %]    Intensity Pain Scale (Self Report): (not recorded)      Sleep:  Hours of Sleep: 6   Quality of Sleep: Resting Quietly    Scheduled Medications:  divalproex (DEPAKOTE ER) ER tablet 2,500 mg, 2,500 mg, Oral, QHS  nicotine (NICODERM CQ STEP 1) 21 mg/day patch 1 patch, 1 patch, Transdermal, QDAY  [START ON 04/20/2021] paliperidone palmitate (INVEGA) (+) injection 156 mg, 156 mg, Intramuscular, Q28 Days (4 weeks)    PRN Medications:  acetaminophen Q6H PRN, calcium carbonate TID PRN, nicotine polacrilex Q2H PRN 4 mg at 04/11/21 1556, OLANZapine Q6H PRN **OR** OLANZapine Q6H PRN, polyethylene glycol 3350 QDAY PRN, traZODone QHS PRN 50 mg at 04/11/21 2229    Mental Status Exam:  ? General/Constitutional: 25 y.o. adult, appears stated age, dressed in hospital attire, fair hygiene   ? Behavior: calm, cooperative, pleasant  ? Speech/Motor: rrr with fair articulation  ? Eye Contact: good  ? Mood: All right  ? Affect: Content, mood congruent  ? Thought Process: linear and goal directed  ? Associations: Intact  ? Thought Content: Denies SI/HI. Shows no evidence of delusions or paranoia   ? Perception: reports continued VH, at baseline, denies AH. Does not appear to be responding to internal stimuli   ? Insight/Judgment: fair/fair     ______________________________________________________________  Jamie Kill, MD

## 2021-04-12 NOTE — Care Plan
Pt continues to be calm, cooperative and compliant. He has refrained from isolating today and has spent most of the day in the milieu.  He has attended/participated in groups and other unit activities.  He has appropriate interactions with staff and peers.  Pt denies SI AH but endorses he has been seeing stars for apprx. 2 years.  He states he chooses to ignore them.    Problem: Health Maintenance - Impaired  Goal: Adequate nutritional intake  Outcome: Goal Ongoing  Goal: Establish therapeutic relationships  Outcome: Goal Ongoing  Goal: Knowledge of health maintenance  Outcome: Goal Ongoing     Problem: Mood - Altered  Goal: Stabilize mood  Outcome: Goal Ongoing  Goal: Knowledge of Altered Mood  Outcome: Goal Ongoing     Problem: Self-esteem - Low  Goal: Demonstration of positive self-esteem  Outcome: Goal Ongoing  Goal: Knowledge of low self-esteem  Outcome: Goal Ongoing     Problem: Thought Process - Altered  Goal: Demonstration of organized thought processes  Outcome: Goal Ongoing  Goal: Knowledge of altered thought process  Outcome: Goal Ongoing     Problem: Violence, self/other-directed, Risk of  Goal: Absence of violence  Outcome: Goal Ongoing  Goal: Knowledge of risk for violence, self/other-directed  Outcome: Goal Ongoing     Problem: Transition Readiness  Goal: Knowledge of transition readiness  Outcome: Goal Ongoing     Problem: Cognitive-Perceptual Pattern - Impaired  Goal: Demonstration of organized thought processes  Outcome: Goal Ongoing  Goal: Knowledge of prescribed medication  Outcome: Goal Ongoing     Problem: Coping - Ineffective, Family  Goal: Effective Coping  Outcome: Goal Ongoing  Goal: Communicate with support staff  Outcome: Goal Ongoing  Goal: Knowledge of positive coping methods  Outcome: Goal Ongoing     Problem: Social Interaction - Impaired  Goal: Increased Social Interaction  Outcome: Goal Ongoing  Goal: Knowledge of social interaction  Outcome: Goal Ongoing

## 2021-04-12 NOTE — Behavioral Health Treatment Team
TREATMENT TEAM NOTE  Name: Jamie [name]        MRN: [id number]          DOB: Dec 27, 1995            Age: 25 y.o.  Admission Date: 03/16/2021       LOS: 0 days    Date of Service: 04/12/2021        Attendees:  UR: Ival Bible, LMSW  Nursing: Romilda Joy, BSN, RN  Therapy: Clement Husbands, [place], [facility]  Therapy: Marijean Heath, LMLP, LCP  CM: Henderson Cloud, LMSW  CM: Neysa Bonito, LPC  CM: Eden Lathe, LPC  CM: Berle Mull, [facility], MT-BC  Activities Coordinator: Philippa Chester  Pharmacy: Malachi Carl, PharmD  Pharmacy: Darrin Luis, PharmD  Psychology: Darryl Lent, MS  Psychology: Madie Reno, MS  Psychology: Peter Minium, PhD  Psychiatry: Shella Maxim. Sherrine Maples, DO  Psychiatry: Frankey Poot, APRN-NP  Psychiatry Resident: Karie Fetch, MD   Psychiatry Resident: Milana Kidney, DO          Significant Points Discussed: CM continues to work on placement    Treatment Plan Discussion: No updates

## 2021-04-13 NOTE — Progress Notes
Psychiatry Progress Note  Name: Jamie [name]          MRN: [id number] DOB: Jun 15, 1996         Age: 25 y.o.  Admission Date: 03/16/2021  LOS: 0 days    Service: [facility] 1    Active Problems:    Schizoaffective disorder, bipolar type (HCC)    Tobacco use disorder, severe, dependence    Withdrawal from methamphetamine (HCC)    Methamphetamine use disorder, moderate (HCC)    PLAN  1. Continue current medication.      REASONS FOR CONTINUED HOSPITALIZATION  Psychiatric stabilization and medication management. Patient with decreased social support and at risk for recidivism if discharged at this time.       ______________________________________________________________  SUBJECTIVE  Patient seen for follow up. He reports his mood as fine. He reports good appetite, energy, and sleep. He denies SI/HI/AH/VH.    REVIEW OF SYSTEMS  Review of Systems   Constitutional: Negative for appetite change.   Respiratory: Negative for shortness of breath.    Cardiovascular: Negative for chest pain.   Gastrointestinal: Negative for nausea and vomiting.   Psychiatric/Behavioral: Negative for hallucinations and suicidal ideas.     ______________________________________________________________  OBJECTIVE                 Vital Signs:  Current                Vital Signs: 24 Hour Range   BP: 103/61 (06/14 0700)  Temp: 36.6 ?C (97.9 ?F) (06/14 0700)  Pulse: 74 (06/14 0700)  Respirations: 16 PER MINUTE (06/14 0700)  SpO2: 99 % (06/14 0700) BP: (103-118)/(61-63)   Temp:  [36.6 ?C (97.9 ?F)-36.9 ?C (98.4 ?F)]   Pulse:  [74-85]   Respirations:  [16 PER MINUTE-18 PER MINUTE]   SpO2:  [97 %-99 %]    Intensity Pain Scale (Self Report): (not recorded)      Scheduled Medications:  divalproex (DEPAKOTE ER) ER tablet 2,500 mg, 2,500 mg, Oral, QHS  nicotine (NICODERM CQ STEP 1) 21 mg/day patch 1 patch, 1 patch, Transdermal, QDAY  [START ON 04/20/2021] paliperidone palmitate (INVEGA) (+) injection 156 mg, 156 mg, Intramuscular, Q28 Days (4 weeks)        PRN Medications:  acetaminophen Q6H PRN, calcium carbonate TID PRN, nicotine polacrilex Q2H PRN 4 mg at 04/12/21 2119, OLANZapine Q6H PRN **OR** OLANZapine Q6H PRN, polyethylene glycol 3350 QDAY PRN, traZODone QHS PRN 50 mg at 04/12/21 2119    Mental Status Exam:    General/Constitutional: adult male  Speech: normal rate, rhythm, volume, and tone  Thought Process: linear and goal directed  Thought Content: Denies SI/HI  Perception: denies AH/VH  Associations: intact  Mood: fine  Affect: euthymic  Insight/Judgement: partial/fair    Orientation: grossly intact  Recent and remote memory: intact  Attention span and concentration: appropriate  Language: fluent, English speaker  [employer] of knowledge and vocabulary: appropriate    Focused Physical Exam:  Musculoskeletal: normal gait      ______________________________________________________________  Alyce Pagan, DO

## 2021-04-13 NOTE — Behavioral Health Treatment Team
TREATMENT TEAM NOTE  Name: Jamie [name]        MRN: [id number]          DOB: 04/24/1996            Age: 25 y.o.  Admission Date: 03/16/2021       LOS: 0 days    Date of Service: 04/13/2021        Attendees:  UR: Ival Bible, LMSW  Nursing: Freddie Apley, RN, BSN  Therapy: Clement Husbands, [place], [facility]  Therapy: Marijean Heath, LMLP, LCP  CM: Neysa Bonito, LPC  CM: Eden Lathe, LPC  CM: Berle Mull, [facility], MT-BC  Rec Therapy: Clare Charon, CTRS  Pharmacy: Hazle Quant, PharmD  Pharmacy: Darrin Luis, PharmD  Psychology: Assunta Gambles. Lowry Bowl, PhD  Psychiatry: Shella Maxim. Sherrine Maples, DO      Significant Points Discussed: Denies SI,HI,AVH, attending groups, present in the dayroom, calm and cooperative.     Treatment Plan Discussion: Waiting for placement

## 2021-04-13 NOTE — Care Coordination-Inpatient
Care Coordination - Inpatient Note      Patient Name:   Jamie [name]                        MRN:  [id number]  Admission Date:  03/16/2021    3:34 pm  CM emailed [facility] to follow up on scheduling Pt's Level 2 screen. Awaiting response.     Merrill [employer]  04/13/2021

## 2021-04-13 NOTE — Care Plan
Pt meeting with treatment team daily.  Discharge planning ongoing.  Patient denies pain, SI/HI,AH endorses some VH.   Mood and behavior have been stable. Pt out in the milieu, watched tv, and interacting with peers.  Pt has been calm and cooperative.  Pt compliant with HS medication.     Problem: Health Maintenance - Impaired  Goal: Adequate nutritional intake  Outcome: Goal Ongoing  Goal: Establish therapeutic relationships  Outcome: Goal Ongoing  Goal: Knowledge of health maintenance  Outcome: Goal Ongoing     Problem: Mood - Altered  Goal: Stabilize mood  Outcome: Goal Ongoing  Goal: Knowledge of Altered Mood  Outcome: Goal Ongoing     Problem: Self-esteem - Low  Goal: Demonstration of positive self-esteem  Outcome: Goal Ongoing  Goal: Knowledge of low self-esteem  Outcome: Goal Ongoing     Problem: Thought Process - Altered  Goal: Demonstration of organized thought processes  Outcome: Goal Ongoing  Goal: Knowledge of altered thought process  Outcome: Goal Ongoing     Problem: Violence, self/other-directed, Risk of  Goal: Absence of violence  Outcome: Goal Ongoing  Goal: Knowledge of risk for violence, self/other-directed  Outcome: Goal Ongoing     Problem: Transition Readiness  Goal: Knowledge of transition readiness  Outcome: Goal Ongoing     Problem: Cognitive-Perceptual Pattern - Impaired  Goal: Demonstration of organized thought processes  Outcome: Goal Ongoing  Goal: Knowledge of prescribed medication  Outcome: Goal Ongoing     Problem: Coping - Ineffective, Family  Goal: Effective Coping  Outcome: Goal Ongoing  Goal: Communicate with support staff  Outcome: Goal Ongoing  Goal: Knowledge of positive coping methods  Outcome: Goal Ongoing     Problem: Social Interaction - Impaired  Goal: Increased Social Interaction  Outcome: Goal Ongoing  Goal: Knowledge of social interaction  Outcome: Goal Ongoing

## 2021-04-13 NOTE — Progress Notes
Chart check completed.

## 2021-04-14 NOTE — Behavioral Health Treatment Team
TREATMENT TEAM NOTE  Name: Jamie [name]        MRN: [id number]          DOB: [birth year]            Age: 25 y.o.  Admission Date: 03/16/2021       LOS: 0 days    Date of Service: 04/14/2021        Attendees:  Nursing: Freddie Apley, RN, BSN  Therapy: Clement Husbands, LSCSW, [employer]  Therapy: Marijean Heath, LMLP, LCP  CM: Henderson Cloud, LMSW  CM: Neysa Bonito, LPC  CM: Berle Mull, [facility], MT-BC  Pharmacy: Darrin Luis, PharmD   Pharmacy: Malachi Carl, PharmD  Psychology: Peter Minium, PhD  Psychology: Madie Reno, MS  Psychiatry: Shella Maxim. Sherrine Maples, DO  Psychiatry: Frankey Poot, APRN-NP  Psychiatry Resident: Milana Kidney, DO  Psychiatry Resident: [name], MD      Significant Points Discussed:   No changes   CM reached out to [facility], no response       Treatment Plan Discussion: continue to monitor

## 2021-04-14 NOTE — Progress Notes
PSYCHIATRIC PROGRESS NOTE     LOS: 29 days  Voluntary/Involuntary Status: Voluntary  Guardian: Parents (Jamie [name], Jamie [name])     MEDICATIONS     Current Psychotropic Medications:   1. Jamie [name] Sustenna IM every 28 days (last injection 03/23/2021)  2. Depakote ER 2500 mg QHS     ASSESSMENT & DIAGNOSES     Jamie [name] is a 25 y.o. Caucasian adult with a history of schizoaffective disorder and polysubstance abuse who presented to [facility] as a transfer from OSH with manic symptoms in context of methamphetamine use. This appears to be precipitated by methamphetamine use. Factors that seem to have predisposed him to mania include schizoaffective disorder, substance abuse and uncle with schizophrenia. This current problem is maintained by ongoing substance abuse, lack of consistent medication compliance, and poor insight. However, protective factors include good support from parents who are guardians, motivation to seek treatment, and fair insight into current situation. Proposed treatment will consist of substance detoxification and medication management.    DSM-5 DIAGNOSES:  1. Stimulant use disorder, methamphetamine type, moderate  2. Methamphetamine withdrawal  3. Schizoaffective disorder, bipolar type  4. Tobacco use disorder (chewing tobacco), severe  5. Stimulant use disorder, cocaine type, unspecified severity    Discussed with Dr. Sherrine Maples.     PLAN     ? No indications for medication changes on 04/14/21.   ? Received Jamie [name] Sustenna 156 mg IM second dose of initiation on 03/23/2021; next dose: 04/20/2021  ? Continue Depakote ER PTA dose 2500 mg QHS for schizoaffective disorder and mood stabilization  ? Level 1 CARES assessment completed 6/8. Level 1 recommended Level 2 assessment.     Metabolic labs completed on:  Lipid Panel:   LDL   Date Value Ref Range Status   03/19/2021 106 (H) <100 mg/dL Final     HDL   Date Value Ref Range Status   03/19/2021 55 >40 MG/DL Final     VLDL   Date Value Ref Range Status 03/19/2021 15 MG/DL Final     Cholesterol   Date Value Ref Range Status   03/19/2021 174 <200 MG/DL Final     Triglycerides   Date Value Ref Range Status   03/19/2021 76 <150 MG/DL Final     Blood Glucose:  Hemoglobin A1C   Date Value Ref Range Status   03/19/2021 5.3 4.0 - 6.0 % Final     Comment:     The ADA recommends that most patients with type 1 and type 2 diabetes maintain   an A1c level <7%.       Disposition: Patient has legal guardian. He is unable to leave AMA unless approved by guardian. Pending evaluation for placement.  ?______________________________________________________________     Jamie [name] was seen and evaluated this morning.  He denies any acute psychiatric concerns other than frustration with still being in the hospital, but not overly distressed by this.  He expresses understanding about the placement process taking a long time.  Overall he is happy to continue admission until he can discharge safely.  Denies any manic or psychotic symptoms, denies SI/HI/AVH.  Asked if he could go back to sleep as he felt tired this morning.  No medication side effects per patient report     REVIEW OF SYSTEMS   Review of Systems   Constitutional: Negative for activity change.   HENT: Negative for sore throat.    Eyes: Negative for pain.   Respiratory: Negative for  shortness of breath.    Cardiovascular: Negative for chest pain.   Gastrointestinal: Negative for constipation and diarrhea.   Genitourinary: Negative for dysuria.   Musculoskeletal: Negative for myalgias.   Neurological: Negative for dizziness and headaches.   Psychiatric/Behavioral: Negative for agitation.      OBJECTIVE                    Vital Signs:  Current                Vital Signs: 24 Hour Range   BP: 106/60 (06/15 0700)  Temp: 36.9 ?C (98.4 ?F) (06/15 0700)  Pulse: 80 (06/15 0700)  Respirations: 16 PER MINUTE (06/15 0700)  SpO2: 98 % (06/15 0700) BP: (106-109)/(60)   Temp:  [36.8 ?C (98.2 ?F)-36.9 ?C (98.4 ?F)]   Pulse: [80-96]   Respirations:  [16 PER MINUTE]   SpO2:  [97 %-98 %]    Intensity Pain Scale (Self Report): (not recorded)      Sleep:  Hours of Sleep: 6.25   Quality of Sleep: Resting Quietly    Scheduled Medications:  divalproex (DEPAKOTE ER) ER tablet 2,500 mg, 2,500 mg, Oral, QHS  nicotine (NICODERM CQ STEP 1) 21 mg/day patch 1 patch, 1 patch, Transdermal, QDAY  [START ON 04/20/2021] paliperidone palmitate (INVEGA) (+) injection 156 mg, 156 mg, Intramuscular, Q28 Days (4 weeks)    PRN Medications:  acetaminophen Q6H PRN, calcium carbonate TID PRN, nicotine polacrilex Q2H PRN 4 mg at 04/13/21 2034, OLANZapine Q6H PRN **OR** OLANZapine Q6H PRN, polyethylene glycol 3350 QDAY PRN, traZODone QHS PRN 50 mg at 04/13/21 2133    Mental Status Exam:  ? General/Constitutional: 25 y.o. adult, appears stated age, dressed in hospital attire, fair hygiene   ? Behavior: calm, cooperative, pleasant  ? Speech/Motor: rrr with fair articulation  ? Eye Contact: good  ? Mood: All right  ? Affect: Content, mood congruent  ? Thought Process: linear and goal directed  ? Associations: Intact  ? Thought Content: Denies SI/HI. Shows no evidence of delusions or paranoia   ? Perception: reports continued VH, at baseline, denies AH. Does not appear to be responding to internal stimuli   ? Insight/Judgment: fair/fair     ______________________________________________________________  [name], MD

## 2021-04-14 NOTE — Progress Notes
Chart check completed.

## 2021-04-14 NOTE — Progress Notes
[facility]  Therapy Progress Note    NAME:Jamie [name] MRN: [id number] DOB:27-Mar-1996 AGE: 25 y.o.  ADMISSION DATE: 03/16/2021 DAYS ADMITTED: LOS: 0 days    Date of Service:  04/14/21    Active Problems:    Schizoaffective disorder, bipolar type (HCC)    Tobacco use disorder, severe, dependence    Withdrawal from methamphetamine [facility])    Methamphetamine use disorder, moderate (HCC)    Objective:  Therapist approached Patient in the Milieu and he agreed to a session; it took place in the large conference room.  Eye contact WNL.  Affect full; mood congruent.    Narrative:  Patient reported he is keeping his spirits up, "because I have to."  He added that he hopes he gets placed soon because he wants to start working again.  Patient reported that he was given one year of probation for his DUI charge; he said that punishment seemed appropriate.    Follow up:  Therapist listened to and reinforced Patient's positive behaviors.  Resiliency skills were reviewed.  Pro-social skills in the Milieu were reinforced.    Treatment Team will continue to follow.

## 2021-04-14 NOTE — Care Plan
Problem: Health Maintenance - Impaired  Goal: Adequate nutritional intake  04/13/2021 2343 by Andrena Mews  Outcome: Goal Ongoing  04/13/2021 2232 by Andrena Mews  Outcome: Goal Ongoing  Goal: Establish therapeutic relationships  04/13/2021 2343 by Andrena Mews  Outcome: Goal Ongoing  04/13/2021 2232 by Andrena Mews  Outcome: Goal Ongoing  Goal: Knowledge of health maintenance  04/13/2021 2343 by Andrena Mews  Outcome: Goal Ongoing  04/13/2021 2232 by Andrena Mews  Outcome: Goal Ongoing     Problem: Mood - Altered  Goal: Stabilize mood  04/13/2021 2343 by Andrena Mews  Outcome: Goal Ongoing  04/13/2021 2232 by Andrena Mews  Outcome: Goal Ongoing  Goal: Knowledge of Altered Mood  04/13/2021 2343 by Andrena Mews  Outcome: Goal Ongoing  04/13/2021 2232 by Andrena Mews  Outcome: Goal Ongoing     Problem: Self-esteem - Low  Goal: Demonstration of positive self-esteem  04/13/2021 2343 by Andrena Mews  Outcome: Goal Ongoing  04/13/2021 2232 by Andrena Mews  Outcome: Goal Ongoing  Goal: Knowledge of low self-esteem  04/13/2021 2343 by Andrena Mews  Outcome: Goal Ongoing  04/13/2021 2232 by Andrena Mews  Outcome: Goal Ongoing     Problem: Thought Process - Altered  Goal: Demonstration of organized thought processes  04/13/2021 2343 by Andrena Mews  Outcome: Goal Ongoing  04/13/2021 2232 by Andrena Mews  Outcome: Goal Ongoing  Goal: Knowledge of altered thought process  04/13/2021 2343 by Andrena Mews  Outcome: Goal Ongoing  04/13/2021 2232 by Andrena Mews  Outcome: Goal Ongoing     Problem: Violence, self/other-directed, Risk of  Goal: Absence of violence  04/13/2021 2343 by Andrena Mews  Outcome: Goal Ongoing  04/13/2021 2232 by Andrena Mews  Outcome: Goal Ongoing  Goal: Knowledge of risk for violence, self/other-directed  04/13/2021 2343 by Andrena Mews  Outcome: Goal Ongoing  04/13/2021 2232 by Andrena Mews  Outcome: Goal Ongoing     Problem: Transition Readiness  Goal: Knowledge of transition readiness  04/13/2021 2343 by Andrena Mews  Outcome: Goal Ongoing  04/13/2021 2232 by Andrena Mews  Outcome: Goal Ongoing     Problem: Cognitive-Perceptual Pattern - Impaired  Goal: Demonstration of organized thought processes  04/13/2021 2343 by Andrena Mews  Outcome: Goal Ongoing  04/13/2021 2232 by Andrena Mews  Outcome: Goal Ongoing  Goal: Knowledge of prescribed medication  04/13/2021 2343 by Andrena Mews  Outcome: Goal Ongoing  04/13/2021 2232 by Andrena Mews  Outcome: Goal Ongoing     Problem: Coping - Ineffective, Family  Goal: Effective Coping  04/13/2021 2343 by Andrena Mews  Outcome: Goal Ongoing  04/13/2021 2232 by Andrena Mews  Outcome: Goal Ongoing  Goal: Communicate with support staff  04/13/2021 2343 by Andrena Mews  Outcome: Goal Ongoing  04/13/2021 2232 by Andrena Mews  Outcome: Goal Ongoing  Goal: Knowledge of positive coping methods  04/13/2021 2343 by Andrena Mews  Outcome: Goal Ongoing  04/13/2021 2232 by Andrena Mews  Outcome: Goal Ongoing     Problem: Social Interaction - Impaired  Goal: Increased Social Interaction  04/13/2021 2343 by Andrena Mews  Outcome: Goal Ongoing  04/13/2021 2232 by Andrena Mews  Outcome: Goal Ongoing  Goal: Knowledge of social interaction  04/13/2021 2343 by Andrena Mews  Outcome: Goal Ongoing  04/13/2021 2232 by Andrena Mews  Outcome: Goal Ongoing

## 2021-04-14 NOTE — Care Plan
RN Shift Assessment    Pt out in milieu socializing with peers and watching TV. A/Ox 4. Reported VH as "the usual". Denied rest of psych. Compliant with medications. Will continue to monitor pt per protocol.    Mental Status Exam  Legal Status: Voluntary Admission  General Appearance: Normal  Mood / Affect: Congruent  Speech: Normal  Content Of Thought: Normal  Motor Activity: Posturing  Flow of Thought: Normal  Sensorium: Normal  Insight / Judgment: Fair judgment, Fair insight  Behavior: Calm, Cooperative  Patient Strengths: Interpersonal relationships and supports, i.e. family, friends, peers

## 2021-04-14 NOTE — Care Plan
Problem: Health Maintenance - Impaired  Goal: Adequate nutritional intake  Outcome: Goal Ongoing  Goal: Establish therapeutic relationships  Outcome: Goal Ongoing  Goal: Knowledge of health maintenance  Outcome: Goal Ongoing     Problem: Mood - Altered  Goal: Stabilize mood  Outcome: Goal Ongoing  Goal: Knowledge of Altered Mood  Outcome: Goal Ongoing     Problem: Self-esteem - Low  Goal: Demonstration of positive self-esteem  Outcome: Goal Ongoing  Goal: Knowledge of low self-esteem  Outcome: Goal Ongoing     Problem: Thought Process - Altered  Goal: Demonstration of organized thought processes  Outcome: Goal Ongoing  Goal: Knowledge of altered thought process  Outcome: Goal Ongoing     Problem: Violence, self/other-directed, Risk of  Goal: Absence of violence  Outcome: Goal Ongoing  Goal: Knowledge of risk for violence, self/other-directed  Outcome: Goal Ongoing     Problem: Transition Readiness  Goal: Knowledge of transition readiness  Outcome: Goal Ongoing     Problem: Cognitive-Perceptual Pattern - Impaired  Goal: Demonstration of organized thought processes  Outcome: Goal Ongoing  Goal: Knowledge of prescribed medication  Outcome: Goal Ongoing     Problem: Coping - Ineffective, Family  Goal: Effective Coping  Outcome: Goal Ongoing  Goal: Communicate with support staff  Outcome: Goal Ongoing  Goal: Knowledge of positive coping methods  Outcome: Goal Ongoing     Problem: Social Interaction - Impaired  Goal: Increased Social Interaction  Outcome: Goal Ongoing  Goal: Knowledge of social interaction  Outcome: Goal Ongoing

## 2021-04-14 NOTE — Progress Notes
PSYCHIATRIC NURSING ASSESSMENT     NAME:Jamie [name]             MRN: [id number]             DOB:1996/10/31          AGE: 25 y.o.  ADMISSION DATE: 03/16/2021             DAYS ADMITTED: LOS: 0 days    Mental Status Exam  Legal Status: Voluntary Admission  General Appearance: Normal  Mood / Affect: Congruent  Speech: Normal  Content Of Thought: Normal  Motor Activity: Normal  Flow of Thought: Normal  Sensorium: Normal  Insight / Judgment: Fair judgment, Fair insight  Behavior: Calm, Cooperative, Follows commands, Normal  Patient Strengths: Exercising self-direction, Knowledge of medications, Managing surrounding demands and opportunities, Attempting to realize one's potential    Jamie [name] denies depression, anxiety, SI/HI, AVH, and pain. He is pleasant and interacts appropriately with peers and staff. Pt stated, "being mad won't change anything," when discussing that they are still searching for placement. RN praised pt for thinking positively. He comes out of his room periodically throughout the shift. He spends a lot of time sleeping. Later in the shift pt was pacing around the unit to get exercise. Med compliant.     Encouraged him to come to staff with concerns. Pt is on 15 minute checks to monitor for safety and behaviors.     Jamie Ramus, RN  04/14/2021

## 2021-04-15 NOTE — Progress Notes
Patient is visible in milieu watching television and having appropriate interactions with staff and peers. Calm and cooperative, compliant with night medications.

## 2021-04-15 NOTE — Care Plan
Problem: Health Maintenance - Impaired  Goal: Adequate nutritional intake  Outcome: Goal Ongoing  Goal: Establish therapeutic relationships  Outcome: Goal Ongoing  Goal: Knowledge of health maintenance  Outcome: Goal Ongoing     Problem: Mood - Altered  Goal: Stabilize mood  Outcome: Goal Ongoing  Goal: Knowledge of Altered Mood  Outcome: Goal Ongoing     Problem: Self-esteem - Low  Goal: Demonstration of positive self-esteem  Outcome: Goal Ongoing  Goal: Knowledge of low self-esteem  Outcome: Goal Ongoing     Problem: Thought Process - Altered  Goal: Demonstration of organized thought processes  Outcome: Goal Ongoing  Goal: Knowledge of altered thought process  Outcome: Goal Ongoing     Problem: Violence, self/other-directed, Risk of  Goal: Absence of violence  Outcome: Goal Ongoing  Goal: Knowledge of risk for violence, self/other-directed  Outcome: Goal Ongoing     Problem: Transition Readiness  Goal: Knowledge of transition readiness  Outcome: Goal Ongoing     Problem: Cognitive-Perceptual Pattern - Impaired  Goal: Demonstration of organized thought processes  Outcome: Goal Ongoing  Goal: Knowledge of prescribed medication  Outcome: Goal Ongoing     Problem: Coping - Ineffective, Family  Goal: Effective Coping  Outcome: Goal Ongoing  Goal: Communicate with support staff  Outcome: Goal Ongoing  Goal: Knowledge of positive coping methods  Outcome: Goal Ongoing     Problem: Social Interaction - Impaired  Goal: Increased Social Interaction  Outcome: Goal Ongoing  Goal: Knowledge of social interaction  Outcome: Goal Ongoing

## 2021-04-15 NOTE — Behavioral Health Treatment Team
TREATMENT TEAM NOTE  Name: Jamie [name]        MRN: [id number]          DOB: August 22, 1996            Age: 25 y.o.  Admission Date: 03/16/2021       LOS: 0 days    Date of Service: 04/15/2021        Attendees:  Nursing: Freddie Apley, RN, BSN  Therapy: Clement Husbands, LSCSW, [facility]  Therapy: Marijean Heath, LMLP, LCP  CM: Henderson Cloud, LMSW  CM: Neysa Bonito, LPC  CM: Berle Mull, [facility], MT-BC  Rec Therapy: Clare Charon, CTRS  Pharmacy: Hazle Quant, PharmD  Psychology: Assunta Gambles. Lowry Bowl, PhD  Psychology: Peter Minium, PhD  Psychiatry: Shella Maxim. Sherrine Maples, DO  Psychiatry Resident: Karie Fetch, MD         Significant Points Discussed: Calm and cooperative, declined groups, present in the dayroom.    Treatment Plan Discussion: Waiting for placement

## 2021-04-15 NOTE — Progress Notes
Psychology [phone])    Service(s): Individual Counseling and Psychotherapy    Presentation: Met withJosiahto provide individual support. We discussed his progress and engaged in some existential discussion about what, if anything, has allowed him to grow and become better through his challenges with mental illness and Bipolar Disorder. We discussed a quote from Georgiana about the purpose of life being the growth of the human soul rather than prosperity and how Daelyn has the potential to grow not in spite of, but because of his adversity. He was receptive, humble, and fully engaged in the discussion. He discussed ways he might take an online course or work while he is in Level 2 care. He was encouraged to think in terms of growth and momentum rather than simply getting through. He reported that he hopes to stay continue getting his Invega injection as he prefers that to a pill form of antipsychotic medication. Overall, Dameir presents with good insight and high motivation to make sustained changes.     MSE: Casually dressed and groomed, A+Ox3, mood "alright, with a good range of affect, euthymic, appropriate humor,eye contact was WNL, speech WNL, TP linear and goal-directed, TC was negative forSI,HI, AVH, insightand judgement weregood, actively engaged in session.    Psychotherapy:Engaged patient in existential meaning-centered psychotherapy, with focus on future-oriented thinking and growth through adverse experiences. He was receptive to intervention.     Plan: Will continue to follow    Winifred Olive, Ph.D.   Psychology Postdoctoral Fellow  Pager [phone]  On [place] [place]

## 2021-04-15 NOTE — Progress Notes
Chart check complete

## 2021-04-15 NOTE — Progress Notes
Case Management Progress Note    NAME:Jamie [name] MRN: [id number] DOB:[birth year] AGE: 25 y.o.  ADMISSION DATE: 03/16/2021 DAYS ADMITTED: LOS: 0 days     Date of Service: 04/15/2021    11:58 am  CM briefly met with Pt to check in on any changes or case management needs. Pt denied any needs at this time and walked away from CM. CM will continue to follow up with Pt throughout his hospital stay.

## 2021-04-15 NOTE — Progress Notes
PSYCHIATRIC PROGRESS NOTE     LOS: 30 days  Voluntary/Involuntary Status: Voluntary  Guardian: Parents (Jamie [name], Jamie [name])     MEDICATIONS     Current Psychotropic Medications:   1. Jamie [name] Sustenna IM every 28 days (last injection 03/23/2021)  2. Depakote ER 2500 mg QHS     ASSESSMENT & DIAGNOSES     Jamie [name] is a 25 y.o. Caucasian adult with a history of schizoaffective disorder and polysubstance abuse who presented to [facility] as a transfer from OSH with manic symptoms in context of methamphetamine use. This appears to be precipitated by methamphetamine use. Factors that seem to have predisposed him to mania include schizoaffective disorder, substance abuse and uncle with schizophrenia. This current problem is maintained by ongoing substance abuse, lack of consistent medication compliance, and poor insight. However, protective factors include good support from parents who are guardians, motivation to seek treatment, and fair insight into current situation. Proposed treatment will consist of substance detoxification and medication management.    DSM-5 DIAGNOSES:  1. Stimulant use disorder, methamphetamine type, moderate  2. Methamphetamine withdrawal  3. Schizoaffective disorder, bipolar type  4. Tobacco use disorder (chewing tobacco), severe  5. Stimulant use disorder, cocaine type, unspecified severity    Discussed with Dr. Sherrine Maples.     PLAN     ? No indications for medication changes on 04/15/21.   ? Received Jamie [name] Sustenna 156 mg IM second dose of initiation on 03/23/2021; next dose: 04/20/2021  ? Continue Depakote ER PTA dose 2500 mg QHS for schizoaffective disorder and mood stabilization  ? Level 1 CARES assessment completed 6/8. Level 1 recommended Level 2 assessment.     Metabolic labs completed on:  Lipid Panel:   LDL   Date Value Ref Range Status   03/19/2021 106 (H) <100 mg/dL Final     HDL   Date Value Ref Range Status   03/19/2021 55 >40 MG/DL Final     VLDL   Date Value Ref Range Status 03/19/2021 15 MG/DL Final     Cholesterol   Date Value Ref Range Status   03/19/2021 174 <200 MG/DL Final     Triglycerides   Date Value Ref Range Status   03/19/2021 76 <150 MG/DL Final     Blood Glucose:  Hemoglobin A1C   Date Value Ref Range Status   03/19/2021 5.3 4.0 - 6.0 % Final     Comment:     The ADA recommends that most patients with type 1 and type 2 diabetes maintain   an A1c level <7%.       Disposition: Patient has legal guardian. He is unable to leave AMA unless approved by guardian. Pending evaluation for placement.  ?______________________________________________________________     Jamie [name] was seen and evaluated this morning.  He denies any acute psychiatric concerns other than frustration with still being in the hospital, but not overly distressed by this.  He expresses understanding about the placement process taking a long time.  Overall he is happy to continue admission until he can discharge safely.  Denies any manic or psychotic symptoms, denies SI/HI/AVH.  Asked if he could go back to sleep as he felt tired this morning.  No medication side effects per patient report     REVIEW OF SYSTEMS   Review of Systems   Constitutional: Negative for activity change.   HENT: Negative for sore throat.    Eyes: Negative for pain.   Respiratory: Negative for  shortness of breath.    Cardiovascular: Negative for chest pain.   Gastrointestinal: Negative for constipation and diarrhea.   Genitourinary: Negative for dysuria.   Musculoskeletal: Negative for myalgias.   Neurological: Negative for dizziness and headaches.   Psychiatric/Behavioral: Negative for agitation.      OBJECTIVE                    Vital Signs:  Current                Vital Signs: 24 Hour Range   BP: 110/57 (06/16 0723)  Temp: 36.5 ?C (97.7 ?F) (06/16 1610)  Pulse: 53 (06/16 0723)  Respirations: 16 PER MINUTE (06/16 0723)  SpO2: 97 % (06/16 0723) BP: (110-129)/(57-64)   Temp:  [36.5 ?C (97.7 ?F)-36.8 ?C (98.2 ?F)]   Pulse: [53-92]   Respirations:  [16 PER MINUTE-18 PER MINUTE]   SpO2:  [97 %-98 %]    Intensity Pain Scale (Self Report): (not recorded)      Sleep:  Hours of Sleep: 6.5   Quality of Sleep: Resting Quietly    Scheduled Medications:  divalproex (DEPAKOTE ER) ER tablet 2,500 mg, 2,500 mg, Oral, QHS  nicotine (NICODERM CQ STEP 1) 21 mg/day patch 1 patch, 1 patch, Transdermal, QDAY  [START ON 04/20/2021] paliperidone palmitate (INVEGA) (+) injection 156 mg, 156 mg, Intramuscular, Q28 Days (4 weeks)    PRN Medications:  acetaminophen Q6H PRN, calcium carbonate TID PRN, nicotine polacrilex Q2H PRN 4 mg at 04/14/21 2059, OLANZapine Q6H PRN **OR** OLANZapine Q6H PRN, polyethylene glycol 3350 QDAY PRN, traZODone QHS PRN 50 mg at 04/14/21 2123    Mental Status Exam:  ? General/Constitutional: 25 y.o. adult, appears stated age, dressed in hospital attire, fair hygiene   ? Behavior: calm, cooperative, pleasant  ? Speech/Motor: rrr with fair articulation  ? Eye Contact: good  ? Mood: All right  ? Affect: Content, mood congruent  ? Thought Process: linear and goal directed  ? Associations: Intact  ? Thought Content: Denies SI/HI. Shows no evidence of delusions or paranoia   ? Perception: reports continued VH, at baseline, denies AH. Does not appear to be responding to internal stimuli   ? Insight/Judgment: fair/fair     ______________________________________________________________  [name], MD

## 2021-04-15 NOTE — Progress Notes
Chart check completed.

## 2021-04-15 NOTE — Care Coordination-Inpatient
Care Coordination - Inpatient Note      Patient Name:   Jamie [name]                        MRN:  [id number]  Admission Date:  03/16/2021    3:45 pm  CM received the following email from [facility]: "Referral has been received. Level II should be scheduled in about 2 weeks."    [employer]  04/15/2021

## 2021-04-15 NOTE — Progress Notes
PSYCHIATRIC NURSING ASSESSMENT     NAME:Jamie [name]             MRN: [id number]             DOB:07-Feb-1996          AGE: 25 y.o.  ADMISSION DATE: 03/16/2021             DAYS ADMITTED: LOS: 0 days    Mental Status Exam  Legal Status: Voluntary Admission  General Appearance: Normal  Mood / Affect: Congruent  Speech: Normal  Content Of Thought: Normal  Motor Activity: Normal  Flow of Thought: Normal  Sensorium: Normal  Insight / Judgment: Fair judgment, Fair insight  Behavior: Calm, Cooperative, Follows commands, Normal  Patient Strengths: Exercising self-direction, Knowledge of medications, Managing surrounding demands and opportunities, Vocational interests, i.e. hobbies    Jamie [name] denies depression, anxiety, SI/HI, AVH, and pain. He reports feeling "sleepy" this morning. He reports that his sleep and appetite are within normal limits. Pt is friendly and jokes around with staff and peers. He declines to attend groups due to "I already sat through all of them a bunch of times." Pt says that he does not feel that he needs to go to a level II facility, but he said that he is "just being patient." PRN nicotine gum administered per orders for cravings.     Encouraged him to come to staff with concerns. Pt is on 15 minute checks to monitor for safety and behaviors.     Renee Ramus, RN  04/15/2021

## 2021-04-15 NOTE — Progress Notes
A medication education group was performed today, and Jamie [name] attended ~43min of the group.  They frequently participated in group and demonstrated good understanding of material discussed.    Common psychiatric medications were reviewed for indications, side effects, and onset of effect.  Methods for medication adherence were addressed with group.      Will discuss with team in rounds.      Group Leader:  Alice Reichert

## 2021-04-16 NOTE — Progress Notes
Chart check completed.

## 2021-04-16 NOTE — Progress Notes
PRN Medication Administered:  Trazodone  Non pharmacological interventions attempted prior to PRN medication administration:  Decrease stimuli    Brief narrative of reason for PRN medication administration: Pt requested as a sleep aid.

## 2021-04-16 NOTE — Progress Notes
PSYCHIATRIC PROGRESS NOTE     LOS: 31 days  Voluntary/Involuntary Status: Voluntary  Guardian: Parents ([name] Rosemount, Kent Shrieves)     MEDICATIONS     Current Psychotropic Medications:   1. Jamie [name] Sustenna IM every 28 days (last injection 03/23/2021)  2. Depakote ER 2500 mg QHS     ASSESSMENT & DIAGNOSES     Jamie [name] is a 25 y.o. Caucasian adult with a history of schizoaffective disorder and polysubstance abuse who presented to [facility] as a transfer from OSH with manic symptoms in context of methamphetamine use. This appears to be precipitated by methamphetamine use. Factors that seem to have predisposed him to mania include schizoaffective disorder, substance abuse and uncle with schizophrenia. This current problem is maintained by ongoing substance abuse, lack of consistent medication compliance, and poor insight. However, protective factors include good support from parents who are guardians, motivation to seek treatment, and fair insight into current situation. Proposed treatment will consist of substance detoxification and medication management.    DSM-5 DIAGNOSES:  1. Stimulant use disorder, methamphetamine type, moderate  2. Methamphetamine withdrawal  3. Schizoaffective disorder, bipolar type  4. Tobacco use disorder (chewing tobacco), severe  5. Stimulant use disorder, cocaine type, unspecified severity    Discussed with Dr. Sherrine Maples.     PLAN     ? No indications for medication changes on 04/16/21.   ? Received Jamie [name] Sustenna 156 mg IM second dose of initiation on 03/23/2021; next dose: 04/20/2021  ? Continue Depakote ER PTA dose 2500 mg QHS for schizoaffective disorder and mood stabilization  ? Level 1 CARES assessment completed 6/8. Level 1 recommended Level 2 assessment.     Metabolic labs completed on:  Lipid Panel:   LDL   Date Value Ref Range Status   03/19/2021 106 (H) <100 mg/dL Final     HDL   Date Value Ref Range Status   03/19/2021 55 >40 MG/DL Final     VLDL   Date Value Ref Range Status 03/19/2021 15 MG/DL Final     Cholesterol   Date Value Ref Range Status   03/19/2021 174 <200 MG/DL Final     Triglycerides   Date Value Ref Range Status   03/19/2021 76 <150 MG/DL Final     Blood Glucose:  Hemoglobin A1C   Date Value Ref Range Status   03/19/2021 5.3 4.0 - 6.0 % Final     Comment:     The ADA recommends that most patients with type 1 and type 2 diabetes maintain   an A1c level <7%.       Disposition: Patient has legal guardian. He is unable to leave AMA unless approved by guardian. Pending evaluation for placement.  ?______________________________________________________________     Jamie [name] was seen and evaluated this morning.  He denies any acute psychiatric concerns other than frustration with still being in the hospital, but not overly distressed by this.  He expresses understanding about the placement process taking a long time.  Overall he is happy to continue admission until he can discharge safely.  Denies any manic or psychotic symptoms, denies SI/HI/AVH.  Asked if he could go back to sleep as he felt tired this morning.  No medication side effects per patient report     REVIEW OF SYSTEMS   Review of Systems   Constitutional: Negative for activity change.   HENT: Negative for sore throat.    Eyes: Negative for pain.   Respiratory: Negative for  shortness of breath.    Cardiovascular: Negative for chest pain.   Gastrointestinal: Negative for constipation and diarrhea.   Genitourinary: Negative for dysuria.   Musculoskeletal: Negative for myalgias.   Neurological: Negative for dizziness and headaches.   Psychiatric/Behavioral: Negative for agitation.      OBJECTIVE                    Vital Signs:  Current                Vital Signs: 24 Hour Range   BP: 132/72 (06/16 1900)  Temp: 37.3 ?C (99.1 ?F) (06/16 1900)  Pulse: 88 (06/16 1900)  Respirations: 18 PER MINUTE (06/16 1900)  SpO2: 96 % (06/16 1900) BP: (110-132)/(57-72)   Temp:  [36.5 ?C (97.7 ?F)-37.3 ?C (99.1 ?F)]   Pulse: [53-88]   Respirations:  [16 PER MINUTE-18 PER MINUTE]   SpO2:  [96 %-97 %]    Intensity Pain Scale (Self Report): (not recorded)      Sleep:  Hours of Sleep: 6.50   Quality of Sleep: Resting Quietly    Scheduled Medications:  divalproex (DEPAKOTE ER) ER tablet 2,500 mg, 2,500 mg, Oral, QHS  nicotine (NICODERM CQ STEP 1) 21 mg/day patch 1 patch, 1 patch, Transdermal, QDAY  [START ON 04/20/2021] paliperidone palmitate (INVEGA) (+) injection 156 mg, 156 mg, Intramuscular, Q28 Days (4 weeks)    PRN Medications:  acetaminophen Q6H PRN, calcium carbonate TID PRN, nicotine polacrilex Q2H PRN 4 mg at 04/15/21 1615, OLANZapine Q6H PRN **OR** OLANZapine Q6H PRN, polyethylene glycol 3350 QDAY PRN, traZODone QHS PRN 50 mg at 04/15/21 2143    Mental Status Exam:  ? General/Constitutional: 25 y.o. adult, appears stated age, dressed in hospital attire, fair hygiene   ? Behavior: calm, cooperative, pleasant  ? Speech/Motor: rrr with fair articulation  ? Eye Contact: good  ? Mood: All right  ? Affect: Content, mood congruent  ? Thought Process: linear and goal directed  ? Associations: Intact  ? Thought Content: Denies SI/HI. Shows no evidence of delusions or paranoia   ? Perception: reports continued VH, at baseline, denies AH. Does not appear to be responding to internal stimuli   ? Insight/Judgment: fair/fair     ______________________________________________________________  [name], MD

## 2021-04-16 NOTE — Progress Notes
Chart check completed

## 2021-04-16 NOTE — Behavioral Health Treatment Team
TREATMENT TEAM NOTE  Name: Mattthew Ziomek        MRN: [id number]          DOB: [birth year]            Age: 25 y.o.  Admission Date: 03/16/2021       LOS: 30 days    Date of Service: 04/16/2021        Attendees:  Nursing: Flonnie Overman, RN  Therapy: Clement Husbands, LSCSW, [facility]  Therapy: Marijean Heath, LMLP, LCP  CM: Henderson Cloud, LMSW  CM: Neysa Bonito, LPC  CM: Eden Lathe, LPC  CM: Berle Mull, [facility], MT-BC  Activities Coordinator: Philippa Chester  Pharmacy: Malachi Carl, PharmD  Psychiatry: Shella Maxim. Sherrine Maples, DO  Psychiatry: Frankey Poot, APRN-NP  Psychiatry Resident: Karie Fetch, MD   Psychiatry Resident: Milana Kidney, DO     Significant Points Discussed: Attends groups, awaiting placement    Treatment Plan Discussion: Awaiting placement

## 2021-04-17 NOTE — Care Plan
Pt is calm, cooperative and compliant.  He is forward thinking and ready to discharge.  He understands placement is the safe discharge plan.  Pt isolated to his room after breakfast, up for lunch and remained up.  He denies SI AVH.  He is appropriate with staff and peers.  Did not attend groups today.    Problem: Health Maintenance - Impaired  Goal: Adequate nutritional intake  Outcome: Goal Ongoing  Goal: Establish therapeutic relationships  Outcome: Goal Ongoing  Goal: Knowledge of health maintenance  Outcome: Goal Ongoing     Problem: Mood - Altered  Goal: Stabilize mood  Outcome: Goal Ongoing  Goal: Knowledge of Altered Mood  Outcome: Goal Ongoing     Problem: Self-esteem - Low  Goal: Demonstration of positive self-esteem  Outcome: Goal Ongoing  Goal: Knowledge of low self-esteem  Outcome: Goal Ongoing     Problem: Thought Process - Altered  Goal: Demonstration of organized thought processes  Outcome: Goal Ongoing  Goal: Knowledge of altered thought process  Outcome: Goal Ongoing     Problem: Violence, self/other-directed, Risk of  Goal: Absence of violence  Outcome: Goal Ongoing  Goal: Knowledge of risk for violence, self/other-directed  Outcome: Goal Ongoing     Problem: Transition Readiness  Goal: Knowledge of transition readiness  Outcome: Goal Ongoing     Problem: Cognitive-Perceptual Pattern - Impaired  Goal: Demonstration of organized thought processes  Outcome: Goal Ongoing  Goal: Knowledge of prescribed medication  Outcome: Goal Ongoing     Problem: Coping - Ineffective, Family  Goal: Effective Coping  Outcome: Goal Ongoing  Goal: Communicate with support staff  Outcome: Goal Ongoing  Goal: Knowledge of positive coping methods  Outcome: Goal Ongoing     Problem: Social Interaction - Impaired  Goal: Increased Social Interaction  Outcome: Goal Ongoing  Goal: Knowledge of social interaction  Outcome: Goal Ongoing

## 2021-04-17 NOTE — Progress Notes
PSYCHIATRIC PROGRESS NOTE     LOS: 31 days  Voluntary/Involuntary Status: Voluntary  Guardian? None       MEDICATIONS   Current Psychotropic Medications:   1. Jamie [name] Sustenna IM every 28 days (last injection 03/23/2021)  2. Depakote ER 2500 mg QHS         ASSESSMENT & DIAGNOSES   Jamie [name]?is a 25 y.o.?Caucasian?adult?with a history of schizoaffective disorder and polysubstance abuse?who presented to [facility] as a transfer from OSH?with manic symptoms in context of methamphetamine use. This appears to be precipitated by?methamphetamine use. Factors that seem to have predisposed?him?to mania?include schizoaffective disorder, substance abuse and uncle with schizophrenia. This current problem is maintained by?ongoing substance abuse, lack of consistent medication compliance, and poor insight. However, protective factors include?good support from parents who are guardians, motivation to seek treatment, and fair insight into current situation. Proposed treatment will consist of?substance detoxification and medication management.  ?  DSM-5 DIAGNOSES:  1. Stimulant use disorder, methamphetamine type, moderate  2. Methamphetamine withdrawal  3. Schizoaffective disorder, bipolar type  4. Tobacco use disorder (chewing tobacco), severe  5. Stimulant use disorder, cocaine type, unspecified severity  ?         PLAN     ? No indications for medication changes on 04/17/21.   ? Received Jamie [name] Sustenna 156 mg IM second dose of initiation on 03/23/2021; next dose: 04/20/2021  ? Continue Depakote ER PTA dose 2500 mg QHS for schizoaffective disorder and mood stabilization  ? Level 1 CARES assessment completed 6/8. Level 1 recommended Level 2 assessment.?    Discussed and seen with Dr. Gilmore Laroche          Metabolic labs completed on:  Lipid Panel:   LDL   Date Value Ref Range Status   03/19/2021 106 (H) <100 mg/dL Final     HDL   Date Value Ref Range Status   03/19/2021 55 >40 MG/DL Final     VLDL   Date Value Ref Range Status 03/19/2021 15 MG/DL Final     Cholesterol   Date Value Ref Range Status   03/19/2021 174 <200 MG/DL Final     Triglycerides   Date Value Ref Range Status   03/19/2021 76 <150 MG/DL Final       Blood Glucose:  Hemoglobin A1C   Date Value Ref Range Status   03/19/2021 5.3 4.0 - 6.0 % Final     Comment:     The ADA recommends that most patients with type 1 and type 2 diabetes maintain   an A1c level <7%.           ______________________________________________________________     Jamie [name] was seen today for follow up.  Today he reports that he is doing okay.  He denies having any SI, HI or AVH.  He reports good sleep and appetite.  He does not have any acute concerns or questions at this time.    He reports that he has been doing better for a while and that he is anxious to leave whenever he has placement.  He does not have any concerns either psychiatric or physical.       REVIEW OF SYSTEMS   Review of Systems   Constitutional: Negative for chills, fatigue and fever.   Psychiatric/Behavioral: Negative for hallucinations, sleep disturbance and suicidal ideas. The patient is not nervous/anxious.           OBJECTIVE  Vital Signs:  Current                Vital Signs: 24 Hour Range   BP: 104/57 (06/18 0718)  Temp: 36.3 ?C (97.3 ?F) (06/18 1610)  Pulse: 63 (06/18 0718)  Respirations: 18 PER MINUTE (06/18 0718)  SpO2: 99 % (06/18 0718) BP: (104-117)/(57-63)   Temp:  [36.3 ?C (97.3 ?F)-37.1 ?C (98.8 ?F)]   Pulse:  [63-80]   Respirations:  [16 PER MINUTE-18 PER MINUTE]   SpO2:  [98 %-99 %]    Intensity Pain Scale (Self Report): (not recorded)      Sleep:  Hours of Sleep: 6.5   Quality of Sleep: Other    Scheduled Medications:  divalproex (DEPAKOTE ER) ER tablet 2,500 mg, 2,500 mg, Oral, QHS  nicotine (NICODERM CQ STEP 1) 21 mg/day patch 1 patch, 1 patch, Transdermal, QDAY  [START ON 04/20/2021] paliperidone palmitate (INVEGA) (+) injection 156 mg, 156 mg, Intramuscular, Q28 Days (4 weeks)        PRN Medications:  acetaminophen Q6H PRN, calcium carbonate TID PRN, nicotine polacrilex Q2H PRN 4 mg at 04/16/21 2013, OLANZapine Q6H PRN **OR** OLANZapine Q6H PRN, polyethylene glycol 3350 QDAY PRN, traZODone QHS PRN 50 mg at 04/15/21 2143      Mental Status Exam:    ? General/Constitutional: 25 y.o. adult, appears stated age, dressed in?hospital?attire, fair hygiene   ? Behavior: calm, cooperative, pleasant  ? Speech/Motor: rrr with fair articulation  ? Eye Contact: good  ? Mood: good  ? Affect: Content, mood congruent  ? Thought Process: linear and goal directed  ? Associations: Intact  ? Thought Content: Denies SI/HI. Shows no evidence of delusions or paranoia   ? Perception: Denies AVH.   Does not appear to be responding to internal stimuli   ? Insight/Judgment: fair/fair        Focused Physical Exam:    ? Musculoskeletal: able to arise from seated position without assistance  ? Neurological: no UE tremor  ? Pulm: Patient is respirating in no apparent distress, no cough     ______________________________________________________________  Viviann Spare, MD

## 2021-04-17 NOTE — Progress Notes
Chart check complete.

## 2021-04-17 NOTE — Care Plan
The patient was seen on the unit. The patient was calm, cooperative, and answered questions without difficulty. He spent the evening in the milieu watching TV and associating with peers. He is compliant with his HS medications. He denies anxiety, depression, pain, SI, and HI. He endorses visual hallucinations in the form of colored spots and floaters. Patient's speech and motor activity appear to be within normal limits. The patient was encouraged to come to staff with concerns.        Problem: Health Maintenance - Impaired  Goal: Adequate nutritional intake  Outcome: Goal Ongoing  Goal: Establish therapeutic relationships  Outcome: Goal Ongoing  Goal: Knowledge of health maintenance  Outcome: Goal Ongoing     Problem: Mood - Altered  Goal: Stabilize mood  Outcome: Goal Ongoing  Goal: Knowledge of Altered Mood  Outcome: Goal Ongoing     Problem: Self-esteem - Low  Goal: Demonstration of positive self-esteem  Outcome: Goal Ongoing  Goal: Knowledge of low self-esteem  Outcome: Goal Ongoing     Problem: Thought Process - Altered  Goal: Demonstration of organized thought processes  Outcome: Goal Ongoing  Goal: Knowledge of altered thought process  Outcome: Goal Ongoing     Problem: Violence, self/other-directed, Risk of  Goal: Absence of violence  Outcome: Goal Ongoing  Goal: Knowledge of risk for violence, self/other-directed  Outcome: Goal Ongoing     Problem: Transition Readiness  Goal: Knowledge of transition readiness  Outcome: Goal Ongoing     Problem: Cognitive-Perceptual Pattern - Impaired  Goal: Demonstration of organized thought processes  Outcome: Goal Ongoing  Goal: Knowledge of prescribed medication  Outcome: Goal Ongoing     Problem: Coping - Ineffective, Family  Goal: Effective Coping  Outcome: Goal Ongoing  Goal: Communicate with support staff  Outcome: Goal Ongoing  Goal: Knowledge of positive coping methods  Outcome: Goal Ongoing     Problem: Social Interaction - Impaired  Goal: Increased Social Interaction  Outcome: Goal Ongoing  Goal: Knowledge of social interaction  Outcome: Goal Ongoing

## 2021-04-17 NOTE — Progress Notes
Chart check completed.

## 2021-04-18 NOTE — Progress Notes
Pt has been calm, cooperative and compliant with all aspects of care. Pt expresses that his mood is great, behaviors are well controlled. He denies any SI, HI, AH and VH. Will continue to monitor.

## 2021-04-18 NOTE — Progress Notes
PSYCHIATRIC PROGRESS NOTE     LOS: 32 days  Voluntary/Involuntary Status: Voluntary  Guardian? None       MEDICATIONS   Current Psychotropic Medications:   1. [name] IM every 28 days (last injection 03/23/2021)  2. Depakote ER 2500 mg QHS         ASSESSMENT & DIAGNOSES   Jamie [name]?is a 25 y.o.?Caucasian?adult?with a history of schizoaffective disorder and polysubstance abuse?who presented to [facility] as a transfer from OSH?with manic symptoms in context of methamphetamine use. This appears to be precipitated by?methamphetamine use. Factors that seem to have predisposed?him?to mania?include schizoaffective disorder, substance abuse and uncle with schizophrenia. This current problem is maintained by?ongoing substance abuse, lack of consistent medication compliance, and poor insight. However, protective factors include?good support from parents who are guardians, motivation to seek treatment, and fair insight into current situation. Proposed treatment will consist of?substance detoxification and medication management.  ?  DSM-5 DIAGNOSES:  1. Stimulant use disorder, methamphetamine type, moderate  2. Methamphetamine withdrawal  3. Schizoaffective disorder, bipolar type  4. Tobacco use disorder (chewing tobacco), severe  5. Stimulant use disorder, cocaine type, unspecified severity  ?         PLAN     ? No indications for medication changes on 04/17/21, 04/18/2021.   ? Received [name] 156 mg IM second dose of initiation on 03/23/2021; next dose: 04/20/2021  ? Continue Depakote ER PTA dose 2500 mg QHS for schizoaffective disorder and mood stabilization  ? Level 1 CARES assessment completed 6/8. Level 1 recommended Level 2 assessment.?    Discussed and seen with Dr. Gilmore Laroche          Metabolic labs completed on:  Lipid Panel:   LDL   Date Value Ref Range Status   03/19/2021 106 (H) <100 mg/dL Final     HDL   Date Value Ref Range Status   03/19/2021 55 >40 MG/DL Final     VLDL   Date Value Ref Range Status   03/19/2021 15 MG/DL Final     Cholesterol   Date Value Ref Range Status   03/19/2021 174 <200 MG/DL Final     Triglycerides   Date Value Ref Range Status   03/19/2021 76 <150 MG/DL Final       Blood Glucose:  Hemoglobin A1C   Date Value Ref Range Status   03/19/2021 5.3 4.0 - 6.0 % Final     Comment:     The ADA recommends that most patients with type 1 and type 2 diabetes maintain   an A1c level <7%.           ______________________________________________________________     Jamie [name] was seen today for follow up.  Today he reports that he is doing okay.  He denies having any SI, HI or AVH.  Understands need for placement after discharge. He reports good sleep and appetite.  He does not have any acute concerns or questions at this time.    Patient asks if placement has been obtained for him, says he is hoping to leave the hospital soon. Denies other concerns today.        REVIEW OF SYSTEMS   Review of Systems   Constitutional: Negative for chills, fatigue and fever.   Psychiatric/Behavioral: Negative for hallucinations, sleep disturbance and suicidal ideas. The patient is not nervous/anxious.           OBJECTIVE  Vital Signs:  Current                Vital Signs: 24 Hour Range   BP: 118/63 (06/18 1900)  Temp: 36.9 ?C (98.4 ?F) (06/18 1900)  Pulse: 90 (06/18 1900)  Respirations: 18 PER MINUTE (06/18 1900)  SpO2: 97 % (06/18 1900) BP: (104-118)/(57-63)   Temp:  [36.3 ?C (97.3 ?F)-36.9 ?C (98.4 ?F)]   Pulse:  [63-90]   Respirations:  [18 PER MINUTE]   SpO2:  [97 %-99 %]    Intensity Pain Scale (Self Report): (not recorded)      Sleep:  Hours of Sleep: 6   Quality of Sleep: Resting Quietly    Scheduled Medications:  divalproex (DEPAKOTE ER) ER tablet 2,500 mg, 2,500 mg, Oral, QHS  nicotine (NICODERM CQ STEP 1) 21 mg/day patch 1 patch, 1 patch, Transdermal, QDAY  [START ON 04/20/2021] paliperidone palmitate (INVEGA) (+) injection 156 mg, 156 mg, Intramuscular, Q28 Days (4 weeks)        PRN Medications:  acetaminophen Q6H PRN, calcium carbonate TID PRN, nicotine polacrilex Q2H PRN 4 mg at 04/17/21 1624, OLANZapine Q6H PRN **OR** OLANZapine Q6H PRN, polyethylene glycol 3350 QDAY PRN, traZODone QHS PRN 50 mg at 04/17/21 2124      Mental Status Exam:    ? General/Constitutional: 25 y.o. adult, appears stated age, dressed in?hospital?attire, fair hygiene   ? Behavior: calm, cooperative, pleasant  ? Speech/Motor: rrr with fair articulation  ? Eye Contact: good  ? Mood: good  ? Affect: Content, mood congruent  ? Thought Process: linear and goal directed  ? Associations: Intact  ? Thought Content: Denies SI/HI. Shows no evidence of delusions or paranoia   ? Perception: Denies AVH.   Does not appear to be responding to internal stimuli   ? Insight/Judgment: fair/fair        Focused Physical Exam:    ? Musculoskeletal: able to arise from seated position without assistance  ? Neurological: no UE tremor  ? Pulm: Patient is respirating in no apparent distress, no cough     ______________________________________________________________  Noni Saupe, MD

## 2021-04-18 NOTE — Care Plan
Pt is calm, cooperative and compliant.  Continues to isolate intermittently to his room.  He is appropriate with staff and peers.  Denies SI AVH    Problem: Health Maintenance - Impaired  Goal: Adequate nutritional intake  Outcome: Goal Ongoing  Goal: Establish therapeutic relationships  Outcome: Goal Ongoing  Goal: Knowledge of health maintenance  Outcome: Goal Ongoing     Problem: Mood - Altered  Goal: Stabilize mood  Outcome: Goal Ongoing  Goal: Knowledge of Altered Mood  Outcome: Goal Ongoing     Problem: Self-esteem - Low  Goal: Demonstration of positive self-esteem  Outcome: Goal Ongoing  Goal: Knowledge of low self-esteem  Outcome: Goal Ongoing     Problem: Thought Process - Altered  Goal: Demonstration of organized thought processes  Outcome: Goal Ongoing  Goal: Knowledge of altered thought process  Outcome: Goal Ongoing     Problem: Violence, self/other-directed, Risk of  Goal: Absence of violence  Outcome: Goal Ongoing  Goal: Knowledge of risk for violence, self/other-directed  Outcome: Goal Ongoing     Problem: Transition Readiness  Goal: Knowledge of transition readiness  Outcome: Goal Ongoing     Problem: Cognitive-Perceptual Pattern - Impaired  Goal: Demonstration of organized thought processes  Outcome: Goal Ongoing  Goal: Knowledge of prescribed medication  Outcome: Goal Ongoing     Problem: Coping - Ineffective, Family  Goal: Effective Coping  Outcome: Goal Ongoing  Goal: Communicate with support staff  Outcome: Goal Ongoing  Goal: Knowledge of positive coping methods  Outcome: Goal Ongoing     Problem: Social Interaction - Impaired  Goal: Increased Social Interaction  Outcome: Goal Ongoing  Goal: Knowledge of social interaction  Outcome: Goal Ongoing

## 2021-04-18 NOTE — Progress Notes
Chart check completed.

## 2021-04-19 NOTE — Care Plan
Patient denies pain, anxiety, depression, SI/HI/AH. Pt endorses VH.Marland Kitchen Mood and behavior have been stable. Pt has been calm and cooperative. Pt out in the milieu, watching TV, and interacting with peers. Pt compliant with HS medications and evening snack. Pt remains on 15 minute checks for safety.       Problem: Health Maintenance - Impaired  Goal: Adequate nutritional intake  Outcome: Goal Ongoing  Goal: Establish therapeutic relationships  Outcome: Goal Ongoing  Goal: Knowledge of health maintenance  Outcome: Goal Ongoing     Problem: Mood - Altered  Goal: Stabilize mood  Outcome: Goal Ongoing  Goal: Knowledge of Altered Mood  Outcome: Goal Ongoing     Problem: Self-esteem - Low  Goal: Demonstration of positive self-esteem  Outcome: Goal Ongoing  Goal: Knowledge of low self-esteem  Outcome: Goal Ongoing     Problem: Thought Process - Altered  Goal: Demonstration of organized thought processes  Outcome: Goal Ongoing  Goal: Knowledge of altered thought process  Outcome: Goal Ongoing     Problem: Violence, self/other-directed, Risk of  Goal: Absence of violence  Outcome: Goal Ongoing  Goal: Knowledge of risk for violence, self/other-directed  Outcome: Goal Ongoing     Problem: Transition Readiness  Goal: Knowledge of transition readiness  Outcome: Goal Ongoing     Problem: Cognitive-Perceptual Pattern - Impaired  Goal: Demonstration of organized thought processes  Outcome: Goal Ongoing  Goal: Knowledge of prescribed medication  Outcome: Goal Ongoing     Problem: Coping - Ineffective, Family  Goal: Effective Coping  Outcome: Goal Ongoing  Goal: Communicate with support staff  Outcome: Goal Ongoing  Goal: Knowledge of positive coping methods  Outcome: Goal Ongoing     Problem: Social Interaction - Impaired  Goal: Increased Social Interaction  Outcome: Goal Ongoing  Goal: Knowledge of social interaction  Outcome: Goal Ongoing

## 2021-04-19 NOTE — Behavioral Health Treatment Team
TREATMENT TEAM NOTE  Name: Jamie [name]        MRN: [id number]          DOB: 12/24/1995            Age: 25 y.o.  Admission Date: 03/16/2021       LOS: 33 days    Date of Service: 04/19/2021        Attendees:   UR: Ival Bible, LMSW  Nursing: Romilda Joy, BSN, RN  Therapy: Marijean Heath, LMLP, LCP  CM: Henderson Cloud, LMSW  CM: Neysa Bonito, LPC  CM: Eden Lathe, LPC  CM: Berle Mull, [place], [place]-BC  Activities Coordinator: Philippa Chester  Pharmacy: Malachi Carl, PharmD  Psychology: Peter Minium, PhD  Psychology: Darryl Lent, MS  Psychiatry: Shella Maxim. Sherrine Maples, DO  Psychiatry: Frankey Poot, APRN-NP  Psychiatry Resident: Karie Fetch, MD         Significant Points Discussed: No update    Treatment Plan Discussion: No update

## 2021-04-19 NOTE — Care Plan
Pt is calm, cooperative and compliant.  He denies SI, endorse VH, states he has ongoing VH x 2 years of stars.  Pt states "I just ignore them".  He continues to intermittently isolate.  His mood is stable.  Pt states he is more than ready to move on to the next phase of his life.  He is pleasant and appropriate with staff and peers.    Problem: Health Maintenance - Impaired  Goal: Establish therapeutic relationships  Outcome: Goal Ongoing  Goal: Knowledge of health maintenance  Outcome: Goal Ongoing     Problem: Coping - Ineffective, Family  Goal: Effective Coping  Outcome: Goal Ongoing  Goal: Communicate with support staff  Outcome: Goal Ongoing  Goal: Knowledge of positive coping methods  Outcome: Goal Ongoing     Problem: Social Interaction - Impaired  Goal: Increased Social Interaction  Outcome: Goal Ongoing  Goal: Knowledge of social interaction  Outcome: Goal Ongoing     Problem: Discharge Planning  Goal: Participation in plan of care  Outcome: Goal Ongoing

## 2021-04-19 NOTE — Progress Notes
PSYCHIATRIC PROGRESS NOTE     LOS: 33 days  Voluntary/Involuntary Status: Voluntary  Guardian? None       MEDICATIONS   Current Psychotropic Medications:   1. [name] IM every 28 days (last injection 03/23/2021)  2. Depakote ER 2500 mg QHS     ASSESSMENT & DIAGNOSES   Jamie [name]?is a 25 y.o.?Caucasian?adult?with a history of schizoaffective disorder and polysubstance abuse?who presented to [facility] as a transfer from OSH?with manic symptoms in context of methamphetamine use. This appears to be precipitated by?methamphetamine use. Factors that seem to have predisposed?him?to mania?include schizoaffective disorder, substance abuse and uncle with schizophrenia. This current problem is maintained by?ongoing substance abuse, lack of consistent medication compliance, and poor insight. However, protective factors include?good support from parents who are guardians, motivation to seek treatment, and fair insight into current situation. Proposed treatment will consist of?substance detoxification and medication management.  ?  DSM-5 DIAGNOSES:  1. Stimulant use disorder, methamphetamine type, moderate  2. Methamphetamine withdrawal  3. Schizoaffective disorder, bipolar type  4. Tobacco use disorder (chewing tobacco), severe  5. Stimulant use disorder, cocaine type, unspecified severity  ?     PLAN     ? No indications for medication changes on 04/17/21, 04/18/2021.   ? Received [name] 156 mg IM second dose of initiation on 03/23/2021; next dose: 04/20/2021  ? Continue Depakote ER PTA dose 2500 mg QHS for schizoaffective disorder and mood stabilization  ? Level 1 CARES assessment completed 6/8. Level 1 recommended Level 2 assessment.?Pending at this time.    Discussed and seen with Dr. Sherrine Maples        Metabolic labs completed on:  Lipid Panel:   LDL   Date Value Ref Range Status   03/19/2021 106 (H) <100 mg/dL Final     HDL   Date Value Ref Range Status   03/19/2021 55 >40 MG/DL Final     VLDL   Date Value Ref Range Status   03/19/2021 15 MG/DL Final     Cholesterol   Date Value Ref Range Status   03/19/2021 174 <200 MG/DL Final     Triglycerides   Date Value Ref Range Status   03/19/2021 76 <150 MG/DL Final       Blood Glucose:  Hemoglobin A1C   Date Value Ref Range Status   03/19/2021 5.3 4.0 - 6.0 % Final     Comment:     The ADA recommends that most patients with type 1 and type 2 diabetes maintain   an A1c level <7%.           ______________________________________________________________     Jamie [name] was seen today for follow up. He denies having any SI, HI or AVH. He reports good sleep and appetite. He does not have any acute concerns or questions at this time. Denies other concerns today.        REVIEW OF SYSTEMS   Review of Systems   Constitutional: Negative for chills, fatigue and fever.   Psychiatric/Behavioral: Negative for hallucinations, sleep disturbance and suicidal ideas. The patient is not nervous/anxious.           OBJECTIVE                    Vital Signs:  Current                Vital Signs: 24 Hour Range   BP: 121/60 (06/20 0729)  Temp: 36.9 ?C (98.5 ?  F) (06/20 4540)  Pulse: 60 (06/20 0729)  Respirations: 16 PER MINUTE (06/20 0729)  SpO2: 98 % (06/20 0729) BP: (121)/(60-67)   Temp:  [36.9 ?C (98.5 ?F)-37 ?C (98.6 ?F)]   Pulse:  [60-73]   Respirations:  [16 PER MINUTE]   SpO2:  [98 %]    Intensity Pain Scale (Self Report): (not recorded)      Sleep:  Hours of Sleep: 6.75   Quality of Sleep: Resting Quietly    Scheduled Medications:  divalproex (DEPAKOTE ER) ER tablet 2,500 mg, 2,500 mg, Oral, QHS  nicotine (NICODERM CQ STEP 1) 21 mg/day patch 1 patch, 1 patch, Transdermal, QDAY  [START ON 04/20/2021] paliperidone palmitate (INVEGA) (+) injection 156 mg, 156 mg, Intramuscular, Q28 Days (4 weeks)        PRN Medications:  acetaminophen Q6H PRN, calcium carbonate TID PRN, nicotine polacrilex Q2H PRN 4 mg at 04/17/21 1624, OLANZapine Q6H PRN **OR** OLANZapine Q6H PRN, polyethylene glycol 3350 QDAY PRN, traZODone QHS PRN 50 mg at 04/18/21 2137      Mental Status Exam:    ? General/Constitutional: 25 y.o. adult, appears stated age, dressed in?hospital?attire, fair hygiene   ? Behavior: calm, cooperative, pleasant  ? Speech/Motor: rrr with fair articulation  ? Eye Contact: good  ? Mood: good  ? Affect: Content, mood congruent  ? Thought Process: linear and goal directed  ? Associations: Intact  ? Thought Content: Denies SI/HI. Shows no evidence of delusions or paranoia   ? Perception: Denies AVH.   Does not appear to be responding to internal stimuli   ? Insight/Judgment: fair/fair      Focused Physical Exam:    ? Musculoskeletal: able to arise from seated position without assistance  ? Neurological: no UE tremor  ? Pulm: Patient is respirating in no apparent distress, no cough     ______________________________________________________________  [name], MD

## 2021-04-20 NOTE — Behavioral Health Treatment Team
TREATMENT TEAM NOTE  Name: Jamie [name]        MRN: [id number]          DOB: [birth year]            Age: 25 y.o.  Admission Date: 03/16/2021       LOS: 34 days    Date of Service: 04/20/2021        Attendees:  UR: Ival Bible, LMSW  Nursing: Raynaldo Opitz, RN  Therapy: Clement Husbands, LSCSW, [facility]  Therapy: Marijean Heath, LMLP, LCP  CM: Henderson Cloud, LMSW  CM: Neysa Bonito, LPC  CM: Eden Lathe, LPC  CM: Berle Mull, [facility], MT-BC  Rec Therapy: Clare Charon, CTRS  Pharmacy: Malachi Carl, PharmD  Psychology: Peter Minium, PhD  Psychiatry: Shella Maxim. Sherrine Maples, DO   Psychiatry Resident: Karie Fetch, MD        Significant Points Discussed: Will receive LEI today, present in the milieu, attending groups.    Treatment Plan Discussion: Sent out some referrals

## 2021-04-20 NOTE — Progress Notes
PRN Medication Administered:  Trazodone  Non pharmacological interventions attempted prior to PRN medication administration:  Decrease stimuli    Brief narrative of reason for PRN medication administration: requested as a sleep aid.

## 2021-04-20 NOTE — Progress Notes
Chart check completed.

## 2021-04-20 NOTE — Care Plan
Pt has been present in milieu. Mood and behavior have been stable. Denies all psych. No behavioral changes, will continue to monitor...      Problem: Health Maintenance - Impaired  Goal: Establish therapeutic relationships  Outcome: Goal Ongoing  Goal: Knowledge of health maintenance  Outcome: Goal Ongoing     Problem: Coping - Ineffective, Family  Goal: Effective Coping  Outcome: Goal Ongoing  Goal: Communicate with support staff  Outcome: Goal Ongoing  Goal: Knowledge of positive coping methods  Outcome: Goal Ongoing     Problem: Social Interaction - Impaired  Goal: Increased Social Interaction  Outcome: Goal Ongoing  Goal: Knowledge of social interaction  Outcome: Goal Ongoing     Problem: Discharge Planning  Goal: Participation in plan of care  Outcome: Goal Ongoing

## 2021-04-20 NOTE — Care Plan
Pt social and appropriate this evening. He spent time in the day area watching television and interacted with peers and staff. Pt continues to deny SI/HI and rate anxiety and depression as normal. Pt requested prn Trazodone as a sleep aid. He was encouraged to come to staff with any care needs or concerns.     Problem: Health Maintenance - Impaired  Goal: Establish therapeutic relationships  Outcome: Goal Ongoing  Goal: Knowledge of health maintenance  Outcome: Goal Ongoing     Problem: Coping - Ineffective, Family  Goal: Effective Coping  Outcome: Goal Ongoing  Goal: Communicate with support staff  Outcome: Goal Ongoing     Problem: Social Interaction - Impaired  Goal: Increased Social Interaction  Outcome: Goal Ongoing

## 2021-04-20 NOTE — Care Coordination-Inpatient
Care Coordination - Inpatient Note      Patient Name:   Jamie [name]                        MRN:  [id number]  Admission Date:  03/16/2021    10:17 am  CM faxed referral to [facility] [phone])    10:19 am  CM emailed referral to [facility] ([email or web address]    Henderson Cloud  04/20/2021

## 2021-04-20 NOTE — Progress Notes
PSYCHIATRIC PROGRESS NOTE     LOS: 34 days  Voluntary/Involuntary Status: Voluntary  Guardian? Yes  Anikin, Ledin (Mother)   [phone]   Ehrin, Arrizon (Father)  [phone]     MEDICATIONS   Current Psychotropic Medications:   1. [name] IM every 28 days (last injection 03/23/2021)  2. Depakote ER 2500 mg QHS     ASSESSMENT & DIAGNOSES   Jamie [name]?is a 25 y.o.?Caucasian?adult?with a history of schizoaffective disorder and polysubstance abuse?who presented to [facility] as a transfer from OSH?with manic symptoms in context of methamphetamine use. This appears to be precipitated by?methamphetamine use. Factors that seem to have predisposed?him?to mania?include schizoaffective disorder, substance abuse and uncle with schizophrenia. This current problem is maintained by?ongoing substance abuse, lack of consistent medication compliance, and poor insight. However, protective factors include?good support from parents who are guardians, motivation to seek treatment, and fair insight into current situation. Proposed treatment will consist of?substance detoxification and medication management.  ?  DSM-5 DIAGNOSES:  1. Stimulant use disorder, methamphetamine type, moderate  2. Methamphetamine withdrawal  3. Schizoaffective disorder, bipolar type  4. Tobacco use disorder (chewing tobacco), severe  5. Stimulant use disorder, cocaine type, unspecified severity  ?     PLAN     ? No indications for medication changes today. Will receive Invega Sustenna 156mg  IM today.  ? Received [name] 156 mg IM second dose of initiation on 03/23/2021; next dose today. Following dose will be   ? Continue Depakote ER PTA dose 2500 mg QHS for schizoaffective disorder and mood stabilization  ? Level 1 CARES assessment completed 6/8. Level 1 recommended Level 2 assessment.?Pending at this time.  ? SW interventions, reaching out to facilities for placement.    Discussed and seen with Dr. Sherrine Maples        Metabolic labs completed on:  Lipid Panel:   LDL   Date Value Ref Range Status   03/19/2021 106 (H) <100 mg/dL Final     HDL   Date Value Ref Range Status   03/19/2021 55 >40 MG/DL Final     VLDL   Date Value Ref Range Status   03/19/2021 15 MG/DL Final     Cholesterol   Date Value Ref Range Status   03/19/2021 174 <200 MG/DL Final     Triglycerides   Date Value Ref Range Status   03/19/2021 76 <150 MG/DL Final       Blood Glucose:  Hemoglobin A1C   Date Value Ref Range Status   03/19/2021 5.3 4.0 - 6.0 % Final     Comment:     The ADA recommends that most patients with type 1 and type 2 diabetes maintain   an A1c level <7%.           ______________________________________________________________     Jamie [name] was seen today for follow up. He denies having any SI, HI or AVH. He reports good sleep and appetite. He does not have any acute concerns or questions at this time. Denies other concerns today.        REVIEW OF SYSTEMS   Review of Systems   Constitutional: Negative for chills, fatigue and fever.   Psychiatric/Behavioral: Negative for hallucinations, sleep disturbance and suicidal ideas. The patient is not nervous/anxious.           OBJECTIVE                    Vital Signs:  Current  Vital Signs: 24 Hour Range   BP: 125/66 (06/20 1900)  Temp: 36.8 ?C (98.2 ?F) (06/20 1900)  Pulse: 81 (06/20 1900)  Respirations: 18 PER MINUTE (06/20 1900)  SpO2: 98 % (06/20 1900) BP: (125)/(66)   Temp:  [36.8 ?C (98.2 ?F)]   Pulse:  [81]   Respirations:  [18 PER MINUTE]   SpO2:  [98 %]    Intensity Pain Scale (Self Report): (not recorded)      Sleep:  Hours of Sleep: 6.5   Quality of Sleep: Resting Quietly    Scheduled Medications:  divalproex (DEPAKOTE ER) ER tablet 2,500 mg, 2,500 mg, Oral, QHS  nicotine (NICODERM CQ STEP 1) 21 mg/day patch 1 patch, 1 patch, Transdermal, QDAY  paliperidone palmitate (INVEGA) (+) injection 156 mg, 156 mg, Intramuscular, Q28 Days (4 weeks)        PRN Medications:  acetaminophen Q6H PRN, calcium carbonate TID PRN, nicotine polacrilex Q2H PRN 4 mg at 04/17/21 1624, OLANZapine Q6H PRN **OR** OLANZapine Q6H PRN, polyethylene glycol 3350 QDAY PRN, traZODone QHS PRN 50 mg at 04/19/21 2117      Mental Status Exam:  ? General/Constitutional: 24 y.o. adult, appears stated age, dressed in?hospital?attire, fair hygiene   ? Behavior: calm, cooperative, pleasant  ? Speech/Motor: rrr with fair articulation  ? Eye Contact: good  ? Mood: good  ? Affect: Content, mood congruent  ? Thought Process: linear and goal directed  ? Associations: Intact  ? Thought Content: Denies SI/HI. Shows no evidence of delusions or paranoia   ? Perception: Denies AVH.   Does not appear to be responding to internal stimuli   ? Insight/Judgment: fair/fair    Focused Physical Exam:  ? Musculoskeletal: able to arise from seated position without assistance  ? Neurological: no UE tremor  ? Pulm: Patient is respirating in no apparent distress, no cough   ______________________________________________________________  [name], MD   Reviewed and addended by [name] M.D. PGY-2

## 2021-04-21 NOTE — Care Plan
Jamie [name] was participating in the unit milieu watching a movie and interacting with staff and peers. He did not seem interested in speaking about his symptoms and but did leave the room for necessary questions. He denied SI or HI. He denied Audio but admitted to visual hallucinations, he declined to discuss them in detail. He was pleasant but after taking his meds stated he was going to return to the movie.   Later he requested a Trazodone for sleep. He is resting at this time.        Problem: Health Maintenance - Impaired  Goal: Establish therapeutic relationships  Outcome: Goal Ongoing  Goal: Knowledge of health maintenance  Outcome: Goal Ongoing     Problem: Coping - Ineffective, Family  Goal: Effective Coping  Outcome: Goal Ongoing  Goal: Communicate with support staff  Outcome: Goal Ongoing     Problem: Social Interaction - Impaired  Goal: Increased Social Interaction  Outcome: Goal Ongoing

## 2021-04-21 NOTE — Behavioral Health Treatment Team
TREATMENT TEAM NOTE  Name: Abad Manard        MRN: [id number]          DOB: 15-Sep-1996            Age: 25 y.o.  Admission Date: 03/16/2021       LOS: 35 days    Date of Service: 04/21/2021        Attendees:  UR: Ival Bible, LMSW  Nursing: Freddie Apley, RN, BSN  Therapy: Clement Husbands, [place], [facility]  Therapy: Marijean Heath, LMLP, LCP  CM: Henderson Cloud, LMSW  CM: Berle Mull, [facility], MT-BC  Rec Therapy: Clare Charon, CTRS  Pharmacy: Malachi Carl, PharmD  Psychology: Assunta Gambles. Lowry Bowl, PhD  Psychology: Peter Minium, PhD  Psychiatry: Shella Maxim. Sherrine Maples, DO  Psychiatry: Frankey Poot, APRN-NP  Psychiatry Resident: Karie Fetch, MD   Psychiatry Resident: Milana Kidney, DO      Significant Points Discussed:   Placement referrals sent    Treatment Plan Discussion:

## 2021-04-21 NOTE — Care Plan
Problem: Health Maintenance - Impaired  Goal: Establish therapeutic relationships  Outcome: Goal Ongoing  Goal: Knowledge of health maintenance  Outcome: Goal Ongoing     Problem: Coping - Ineffective, Family  Goal: Effective Coping  Outcome: Goal Ongoing  Goal: Communicate with support staff  Outcome: Goal Ongoing  Goal: Knowledge of positive coping methods  Outcome: Goal Ongoing     Problem: Social Interaction - Impaired  Goal: Increased Social Interaction  Outcome: Goal Ongoing  Goal: Knowledge of social interaction  Outcome: Goal Ongoing     Problem: Discharge Planning  Goal: Participation in plan of care  Outcome: Goal Ongoing

## 2021-04-21 NOTE — Progress Notes
4142  Patient was calm, cooperative and compliant. Patient endorsed mild pain in his left shoulder. Patient denied medication to alleviate his symptoms. In regards to his sleep quality he simply stated "mmm." He has had appropriate interactions with staff and peers and is able to state his needs adequately.

## 2021-04-21 NOTE — Progress Notes
Chart Checks complete.

## 2021-04-21 NOTE — Progress Notes
Chart check completed.

## 2021-04-21 NOTE — Progress Notes
[facility]  Therapy Progress Note    NAME:Jamie [name] MRN: [id number] DOB:1996/04/26 AGE: 25 y.o.  ADMISSION DATE: 03/16/2021 DAYS ADMITTED: LOS: 35 days    Date of Service:  04/21/21    Active Problems:    Schizoaffective disorder, bipolar type (HCC)    Tobacco use disorder, severe, dependence    Withdrawal from methamphetamine [facility])    Methamphetamine use disorder, moderate (HCC)    Objective:  Therapist approached Patient as he was walking up/down the hallway.  Eye contact WNL.  Affect full; mood congruent.    Narrative:  Patient reported he was doing well.  He said his weight had increased and he was attempting to engage in some physical activity.  Patient presented this information in a light/funny manner.    Follow up:  Therapist reminded Patient that individual sessions are available at his request; he expressed understanding.  Treatment Team will continue to follow.

## 2021-04-21 NOTE — Progress Notes
PSYCHIATRIC PROGRESS NOTE     LOS: 35 days  Voluntary/Involuntary Status: Voluntary  Guardian? Yes  Jamie, [name] (Mother)   [phone]   Jamie, [name] (Father)  [phone]     MEDICATIONS   Current Psychotropic Medications:   1. [name] IM every 28 days (last injection 03/23/2021)  2. Depakote ER 2500 mg QHS     ASSESSMENT & DIAGNOSES   Jamie [name] a 25 y.o.?Caucasian?adult?with a history of schizoaffective disorder and polysubstance abuse?who presented to [facility] as a transfer from OSH?with manic symptoms in context of methamphetamine use. This appears to be precipitated by?methamphetamine use. Factors that seem to have predisposed?him?to mania?include schizoaffective disorder, substance abuse and uncle with schizophrenia. This current problem is maintained by?ongoing substance abuse, lack of consistent medication compliance, and poor insight. However, protective factors include?good support from parents who are guardians, motivation to seek treatment, and fair insight into current situation. Proposed treatment will consist of?substance detoxification and medication management.  ?  DSM-5 DIAGNOSES:  1. Stimulant use disorder, methamphetamine type, moderate  2. Methamphetamine withdrawal, resolved  3. Schizoaffective disorder, bipolar type  4. Tobacco use disorder (chewing tobacco), severe  5. Stimulant use disorder, cocaine type, unspecified severity  ?     PLAN     ? No indications for medication changes today.   ? Received Invega Sustenna 156 mg IM on 04/20/21  ? Continue Depakote ER PTA dose 2500 mg QHS for schizoaffective disorder and mood stabilization  ? Level 1 CARES assessment completed 6/8. Level 1 recommended Level 2 assessment.?Pending at this time.  ? SW interventions, reaching out to facilities for placement.    Discussed and seen with Dr. Sherrine Maples        Metabolic labs completed on:  Lipid Panel:   LDL   Date Value Ref Range Status   03/19/2021 106 (H) <100 mg/dL Final     HDL Date Value Ref Range Status   03/19/2021 55 >40 MG/DL Final     VLDL   Date Value Ref Range Status   03/19/2021 15 MG/DL Final     Cholesterol   Date Value Ref Range Status   03/19/2021 174 <200 MG/DL Final     Triglycerides   Date Value Ref Range Status   03/19/2021 76 <150 MG/DL Final       Blood Glucose:  Hemoglobin A1C   Date Value Ref Range Status   03/19/2021 5.3 4.0 - 6.0 % Final     Comment:     The ADA recommends that most patients with type 1 and type 2 diabetes maintain   an A1c level <7%.           ______________________________________________________________     Jamie [name] was seen today for follow up. He denies having any SI, HI or AVH. He reports good sleep and appetite. He does not have any acute concerns or questions at this time. Denies other concerns today.        REVIEW OF SYSTEMS   Review of Systems   Constitutional: Negative for chills, fatigue and fever.   Psychiatric/Behavioral: Negative for hallucinations, sleep disturbance and suicidal ideas. The patient is not nervous/anxious.           OBJECTIVE                    Vital Signs:  Current                Vital Signs: 24 Hour  Range   BP: 98/47 (06/22 0716)  Temp: 36.4 ?C (97.5 ?F) (06/22 1914)  Pulse: 58 (06/22 0716)  Respirations: 16 PER MINUTE (06/22 0716)  SpO2: 96 % (06/22 0716) BP: (98-116)/(47-61)   Temp:  [36.4 ?C (97.5 ?F)-36.7 ?C (98 ?F)]   Pulse:  [58-81]   Respirations:  [16 PER MINUTE]   SpO2:  [96 %-97 %]    Intensity Pain Scale (Self Report): (not recorded)      Sleep:  Hours of Sleep: 6.5   Quality of Sleep: Resting Quietly    Scheduled Medications:  divalproex (DEPAKOTE ER) ER tablet 2,500 mg, 2,500 mg, Oral, QHS  nicotine (NICODERM CQ STEP 1) 21 mg/day patch 1 patch, 1 patch, Transdermal, QDAY  paliperidone palmitate (INVEGA) (+) injection 156 mg, 156 mg, Intramuscular, Q28 Days (4 weeks)        PRN Medications:  acetaminophen Q6H PRN, calcium carbonate TID PRN, nicotine polacrilex Q2H PRN 4 mg at 04/17/21 1624, OLANZapine Q6H PRN **OR** OLANZapine Q6H PRN, polyethylene glycol 3350 QDAY PRN, traZODone QHS PRN 50 mg at 04/20/21 2130      Mental Status Exam:  ? General/Constitutional: 25 y.o. adult, appears stated age, dressed in?hospital?attire, fair hygiene   ? Behavior: calm, cooperative, pleasant  ? Speech/Motor: rrr with fair articulation  ? Eye Contact: good  ? Mood: good  ? Affect: Content, mood congruent  ? Thought Process: linear and goal directed  ? Associations: Intact  ? Thought Content: Denies SI/HI. Shows no evidence of delusions or paranoia   ? Perception: Denies AVH.   Does not appear to be responding to internal stimuli   ? Insight/Judgment: fair/fair    Focused Physical Exam:  ? Musculoskeletal: able to arise from seated position without assistance  ? Neurological: no UE tremor  ? Pulm: Patient is respirating in no apparent distress, no cough   ______________________________________________________________  [name], MD   PGY-2 Psychiatry

## 2021-04-22 NOTE — Care Plan
Pt social and appropriate in the milieu this evening. He reported he was hanging in the dining area because the peers in the television area were talking a lot about drug use and he was not into that. 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 Pt complied with scheduled medications and denied any current psych needs or symptoms. Will continue to monitor.     Problem: Health Maintenance - Impaired  Goal: Establish therapeutic relationships  Outcome: Goal Ongoing     Problem: Coping - Ineffective, Family  Goal: Effective Coping  Outcome: Goal Ongoing  Goal: Communicate with support staff  Outcome: Goal Ongoing  Goal: Knowledge of positive coping methods  Outcome: Goal Ongoing     Problem: Social Interaction - Impaired  Goal: Increased Social Interaction  Outcome: Goal Ongoing

## 2021-04-22 NOTE — Progress Notes
PRN Medication Administered:  Trazodone  Non pharmacological interventions attempted prior to PRN medication administration:  Decrease stimuli    Brief narrative of reason for PRN medication administration: requested as a sleep aid.

## 2021-04-22 NOTE — Behavioral Health Treatment Team
TREATMENT TEAM NOTE  Name: Dhruva Orndoff        MRN: [id number]          DOB: 1996-08-14            Age: 25 y.o.  Admission Date: 03/16/2021       LOS: 36 days    Date of Service: 04/22/2021        Attendees:  UR: Ival Bible, LMSW   Nursing: Pearline Cables RN  Therapy: Clement Husbands, LSCSW, [facility]  Therapy: Marijean Heath, LMLP, LCP  CM: Henderson Cloud, LMSW  CM: Neysa Bonito, LPC  CM: Eden Lathe, LPC  CM: Berle Mull, [facility], MT-BC  Rec Therapy: Clare Charon, CTRS  Nurse Manager: Ursula Beath, RN  Pharmacy: Malachi Carl, PharmD  Psychology: Assunta Gambles. Lowry Bowl, PhD  Psychology: Pincus Large, Psychology intern  Psychology: Peter Minium, PhD  Psychiatry: Shella Maxim. Sherrine Maples, DO  Psychiatry Resident: Karie Fetch, MD        Significant Points Discussed: Denies SI,HI,AVH, did not attend groups.    Treatment Plan Discussion: Waiting for discharge, continue treatment

## 2021-04-22 NOTE — Care Plan
Problem: Health Maintenance - Impaired  Goal: Establish therapeutic relationships  Outcome: Goal Ongoing  Goal: Knowledge of health maintenance  Outcome: Goal Ongoing     Problem: Coping - Ineffective, Family  Goal: Effective Coping  Outcome: Goal Ongoing  Goal: Communicate with support staff  Outcome: Goal Ongoing  Goal: Knowledge of positive coping methods  Outcome: Goal Ongoing     Problem: Social Interaction - Impaired  Goal: Increased Social Interaction  Outcome: Goal Ongoing  Goal: Knowledge of social interaction  Outcome: Goal Ongoing     Problem: Discharge Planning  Goal: Participation in plan of care  Outcome: Goal Ongoing

## 2021-04-22 NOTE — Progress Notes
Chart check completed.

## 2021-04-22 NOTE — Progress Notes
8756  Patient was calm, cooperative and compliant. He denied SI/HI/AH/depression/anxiety/paranoia. He endorsed VH and stated "I see floaters and stuff but they don't make me scared." Patient endorsed pain in his left shoulder which he attributed to an injection that happened a few days ago. Patient declined a medication to alleviate his symptoms. Patient shared that he did not sleep well last night. He has had appropriate interactions with staff and peers and is able to state his needs adequately.

## 2021-04-22 NOTE — Progress Notes
PSYCHIATRIC PROGRESS NOTE     LOS: 36 days  Voluntary/Involuntary Status: Voluntary  Guardian? Yes  Jamie [name], Jamie [name] (Mother)   [phone]   Jamie [name], Jamie [name] (Father)  [phone]     MEDICATIONS   Current Psychotropic Medications:   1. [name] IM every 28 days (last injection 03/23/2021)  2. Depakote ER 2500 mg QHS     ASSESSMENT & DIAGNOSES   Jamie [name]?is a 25 y.o.?Caucasian?adult?with a history of schizoaffective disorder and polysubstance abuse?who presented to [facility] as a transfer from OSH?with manic symptoms in context of methamphetamine use. This appears to be precipitated by?methamphetamine use. Factors that seem to have predisposed?him?to mania?include schizoaffective disorder, substance abuse and uncle with schizophrenia. This current problem is maintained by?ongoing substance abuse, lack of consistent medication compliance, and poor insight. However, protective factors include?good support from parents who are guardians, motivation to seek treatment, and fair insight into current situation. Proposed treatment will consist of?substance detoxification and medication management.  ?  DSM-5 DIAGNOSES:  1. Stimulant use disorder, methamphetamine type, moderate  2. Methamphetamine withdrawal, resolved  3. Schizoaffective disorder, bipolar type  4. Tobacco use disorder (chewing tobacco), severe  5. Stimulant use disorder, cocaine type, unspecified severity  ?     PLAN     ? No indications for medication changes today.   ? Received Invega Sustenna 156 mg IM on 04/20/21  ? Continue Depakote ER PTA dose 2500 mg QHS for schizoaffective disorder and mood stabilization  ? Level 1 CARES assessment completed 6/8. Level 1 recommended Level 2 assessment.?Pending at this time.  ? SW interventions, reaching out to facilities for placement.    Discussed and seen with Dr. Sherrine Maples        Metabolic labs completed on:  Lipid Panel:   LDL   Date Value Ref Range Status   03/19/2021 106 (H) <100 mg/dL Final     HDL Date Value Ref Range Status   03/19/2021 55 >40 MG/DL Final     VLDL   Date Value Ref Range Status   03/19/2021 15 MG/DL Final     Cholesterol   Date Value Ref Range Status   03/19/2021 174 <200 MG/DL Final     Triglycerides   Date Value Ref Range Status   03/19/2021 76 <150 MG/DL Final       Blood Glucose:  Hemoglobin A1C   Date Value Ref Range Status   03/19/2021 5.3 4.0 - 6.0 % Final     Comment:     The ADA recommends that most patients with type 1 and type 2 diabetes maintain   an A1c level <7%.           ______________________________________________________________     Jamie [name] was seen today for follow up. He denies having any SI, HI or AVH. He reports good sleep and appetite. He does not have any acute concerns or questions at this time. Denies other concerns today.        REVIEW OF SYSTEMS   Review of Systems   Constitutional: Negative for chills, fatigue and fever.   Psychiatric/Behavioral: Negative for hallucinations, sleep disturbance and suicidal ideas. The patient is not nervous/anxious.           OBJECTIVE                    Vital Signs:  Current                Vital Signs: 24 Hour  Range   BP: 121/60 (06/22 1900)  Temp: 36.9 ?C (98.4 ?F) (06/22 1900)  Pulse: 92 (06/22 1900)  Respirations: 16 PER MINUTE (06/22 1900)  SpO2: 99 % (06/22 1900) BP: (106-121)/(55-60)   Temp:  [36.9 ?C (98.4 ?F)]   Pulse:  [92]   Respirations:  [16 PER MINUTE]   SpO2:  [99 %]    Intensity Pain Scale (Self Report): (not recorded)      Sleep:  Hours of Sleep: 6.25   Quality of Sleep: Resting Quietly    Scheduled Medications:  divalproex (DEPAKOTE ER) ER tablet 2,500 mg, 2,500 mg, Oral, QHS  nicotine (NICODERM CQ STEP 1) 21 mg/day patch 1 patch, 1 patch, Transdermal, QDAY  paliperidone palmitate (INVEGA) (+) injection 156 mg, 156 mg, Intramuscular, Q28 Days (4 weeks)        PRN Medications:  acetaminophen Q6H PRN, calcium carbonate TID PRN, nicotine polacrilex Q2H PRN 4 mg at 04/17/21 1624, OLANZapine Q6H PRN **OR** OLANZapine Q6H PRN, polyethylene glycol 3350 QDAY PRN, traZODone QHS PRN 50 mg at 04/21/21 2136      Mental Status Exam:  ? General/Constitutional: 25 y.o. adult, appears stated age, dressed in?hospital?attire, fair hygiene   ? Behavior: calm, cooperative, pleasant  ? Speech/Motor: rrr with fair articulation  ? Eye Contact: good  ? Mood: good  ? Affect: Content, mood congruent  ? Thought Process: linear and goal directed  ? Associations: Intact  ? Thought Content: Denies SI/HI. Shows no evidence of delusions or paranoia   ? Perception: Denies AVH.   Does not appear to be responding to internal stimuli   ? Insight/Judgment: fair/fair    Focused Physical Exam:  ? Musculoskeletal: able to arise from seated position without assistance  ? Neurological: no UE tremor  ? Pulm: Patient is respirating in no apparent distress, no cough   ______________________________________________________________  [name], MD   PGY-2 Psychiatry

## 2021-04-23 NOTE — Progress Notes
Chart check completed.

## 2021-04-23 NOTE — Progress Notes
Chart check completed

## 2021-04-23 NOTE — Care Plan
Pt in the day area this evening watching television. He reported that "all of these nights are just the same." Pt stated a desire to try and be more active as he has gained weight since admission. He remained compliant with scheduled medications and prn Trazodone. Will continue to monitor.     Problem: Health Maintenance - Impaired  Goal: Establish therapeutic relationships  Outcome: Goal Ongoing  Goal: Knowledge of health maintenance  Outcome: Goal Ongoing     Problem: Coping - Ineffective, Family  Goal: Effective Coping  Outcome: Goal Ongoing  Goal: Communicate with support staff  Outcome: Goal Ongoing

## 2021-04-23 NOTE — Behavioral Health Treatment Team
TREATMENT TEAM NOTE  Name: Jamie [name]        MRN: [id number]          DOB: [birth year]            Age: 25 y.o.  Admission Date: 03/16/2021       LOS: 37 days    Date of Service: 04/23/2021        Attendees:  UR: Ival Bible, LMSW  Nursing: Freddie Apley, RN, BSN  Therapy: Clement Husbands, LSCSW, [facility]  CM: Henderson Cloud, LMSW  CM: Neysa Bonito, LPC  CM: Eden Lathe, [facility]  Activities Coordinator: Philippa Chester  Clinical Manager: Lacy Duverney, LSCSW  Nurse Manager: Ursula Beath, RN  Pharmacy: Malachi Carl, PharmD  Pharmacy: Royetta Crochet, PharmD  Psychiatry: Shella Maxim. Sherrine Maples, DO  Psychiatry: Frankey Poot, APRN-NP  Psychiatry Resident: Karie Fetch, MD   Psychiatry Resident: Milana Kidney, DO           Significant Points Discussed: No updates    Treatment Plan Discussion: No updates

## 2021-04-23 NOTE — Progress Notes
PRN Medication Administered:  Trazodone  Non pharmacological interventions attempted prior to PRN medication administration:  Decrease stimuli    Brief narrative of reason for PRN medication administration: requested as a sleep aid.

## 2021-04-23 NOTE — Progress Notes
PSYCHIATRIC PROGRESS NOTE     LOS: 37 days  Voluntary/Involuntary Status: Voluntary  Guardian? Yes  Jamie [name], Jamie [name] (Mother)   [phone]   Jamie [name], Jamie [name] (Father)  [phone]     MEDICATIONS   Current Psychotropic Medications:   1. [name] IM every 28 days (last injection 03/23/2021)  2. Depakote ER 2500 mg QHS     ASSESSMENT & DIAGNOSES   Jamie [name]?is a 25 y.o.?Caucasian?adult?with a history of schizoaffective disorder and polysubstance abuse?who presented to [facility] as a transfer from OSH?with manic symptoms in context of methamphetamine use. This appears to be precipitated by?methamphetamine use. Factors that seem to have predisposed?him?to mania?include schizoaffective disorder, substance abuse and uncle with schizophrenia. This current problem is maintained by?ongoing substance abuse, lack of consistent medication compliance, and poor insight. However, protective factors include?good support from parents who are guardians, motivation to seek treatment, and fair insight into current situation. Proposed treatment will consist of?substance detoxification and medication management.  ?  DSM-5 DIAGNOSES:  1. Stimulant use disorder, methamphetamine type, moderate  2. Methamphetamine withdrawal, resolved  3. Schizoaffective disorder, bipolar type  4. Tobacco use disorder (chewing tobacco), severe  5. Stimulant use disorder, cocaine type, unspecified severity  ?     PLAN     ? No indications for medication changes today.   ? Received Invega Sustenna 156 mg IM on 04/20/21  ? Continue Depakote ER PTA dose 2500 mg QHS for schizoaffective disorder and mood stabilization  ? Level 1 CARES assessment completed 6/8. Level 1 recommended Level 2 assessment.?Pending at this time.  ? SW interventions, reaching out to facilities for placement.    Discussed and seen with Dr. Sherrine Maples        Metabolic labs completed on:  Lipid Panel:   LDL   Date Value Ref Range Status   03/19/2021 106 (H) <100 mg/dL Final     HDL Date Value Ref Range Status   03/19/2021 55 >40 MG/DL Final     VLDL   Date Value Ref Range Status   03/19/2021 15 MG/DL Final     Cholesterol   Date Value Ref Range Status   03/19/2021 174 <200 MG/DL Final     Triglycerides   Date Value Ref Range Status   03/19/2021 76 <150 MG/DL Final       Blood Glucose:  Hemoglobin A1C   Date Value Ref Range Status   03/19/2021 5.3 4.0 - 6.0 % Final     Comment:     The ADA recommends that most patients with type 1 and type 2 diabetes maintain   an A1c level <7%.           ______________________________________________________________     Jamie [name] was seen today for follow up. He denies having any SI, HI or AVH. He reports good sleep and appetite. He does not have any acute concerns or questions at this time. Denies other concerns today.        REVIEW OF SYSTEMS   Review of Systems   Constitutional: Negative for chills, fatigue and fever.   Psychiatric/Behavioral: Negative for hallucinations, sleep disturbance and suicidal ideas. The patient is not nervous/anxious.           OBJECTIVE                    Vital Signs:  Current                Vital Signs: 24 Hour  Range   BP: 117/60 (06/23 1900)  Temp: 36.8 ?C (98.2 ?F) (06/23 1900)  Pulse: 86 (06/23 1900)  Respirations: 20 PER MINUTE (06/23 1900)  SpO2: 96 % (06/23 1900) BP: (117)/(60)   Temp:  [36.8 ?C (98.2 ?F)]   Pulse:  [86]   Respirations:  [20 PER MINUTE]   SpO2:  [96 %]    Intensity Pain Scale (Self Report): (not recorded)      Sleep:  Hours of Sleep: 6   Quality of Sleep: Resting Quietly    Scheduled Medications:  divalproex (DEPAKOTE ER) ER tablet 2,500 mg, 2,500 mg, Oral, QHS  nicotine (NICODERM CQ STEP 1) 21 mg/day patch 1 patch, 1 patch, Transdermal, QDAY  paliperidone palmitate (INVEGA) (+) injection 156 mg, 156 mg, Intramuscular, Q28 Days (4 weeks)        PRN Medications:  acetaminophen Q6H PRN, calcium carbonate TID PRN, nicotine polacrilex Q2H PRN 4 mg at 04/22/21 1203, OLANZapine Q6H PRN **OR** OLANZapine Q6H PRN, polyethylene glycol 3350 QDAY PRN, traZODone QHS PRN 50 mg at 04/22/21 2124      Mental Status Exam:  ? General/Constitutional: 25 y.o. adult, appears stated age, dressed in?hospital?attire, fair hygiene   ? Behavior: calm, cooperative, pleasant  ? Speech/Motor: rrr with fair articulation  ? Eye Contact: good  ? Mood: good  ? Affect: Content, mood congruent  ? Thought Process: linear and goal directed  ? Associations: Intact  ? Thought Content: Denies SI/HI. Shows no evidence of delusions or paranoia   ? Perception: Denies AVH.   Does not appear to be responding to internal stimuli   ? Insight/Judgment: fair/fair    Focused Physical Exam:  ? Musculoskeletal: able to arise from seated position without assistance  ? Neurological: no UE tremor  ? Pulm: Patient is respirating in no apparent distress, no cough   ______________________________________________________________  [name], MD   PGY-2 Psychiatry

## 2021-04-23 NOTE — Care Plan
Patient visible in day room interacting with staff and peers appropriately. Denies any new needs at this time. Compliant with medication and cooperative with care. Will continue to monitor.   Problem: Health Maintenance - Impaired  Goal: Establish therapeutic relationships  Outcome: Goal Ongoing  Goal: Knowledge of health maintenance  Outcome: Goal Ongoing     Problem: Coping - Ineffective, Family  Goal: Effective Coping  Outcome: Goal Ongoing  Goal: Communicate with support staff  Outcome: Goal Ongoing  Goal: Knowledge of positive coping methods  Outcome: Goal Ongoing     Problem: Social Interaction - Impaired  Goal: Increased Social Interaction  Outcome: Goal Ongoing  Goal: Knowledge of social interaction  Outcome: Goal Ongoing     Problem: Discharge Planning  Goal: Participation in plan of care  Outcome: Goal Ongoing

## 2021-04-23 NOTE — Progress Notes
Case Management Progress Note    NAME:Jamie [name] MRN: [id number] DOB:Apr 05, 1996 AGE: 25 y.o.  ADMISSION DATE: 03/16/2021 DAYS ADMITTED: LOS: 37 days     Date of Service: 04/23/2021    11:20 am  Pt stopped CM in hallway to ask if he would be able to work while he was living at a Level 2 facility. CM explained that most Level 2 facilities are locked and he would not be able to work while he was living there. Pt appeared to be upset by this news and walked away from CM. CM will continue to follow up with Pt throughout his hospitalization.

## 2021-04-24 NOTE — Progress Notes
Chart check completed

## 2021-04-24 NOTE — Care Plan
Patient visible in day room interacting appropriately with staff and peers. Bright affect, pleasant mood. No new needs or changes this shift. Compliant with medications and cooperative with care. Will continue to monitor.   Problem: Health Maintenance - Impaired  Goal: Establish therapeutic relationships  Outcome: Goal Ongoing  Goal: Knowledge of health maintenance  Outcome: Goal Ongoing     Problem: Coping - Ineffective, Family  Goal: Effective Coping  Outcome: Goal Ongoing  Goal: Communicate with support staff  Outcome: Goal Ongoing  Goal: Knowledge of positive coping methods  Outcome: Goal Ongoing     Problem: Social Interaction - Impaired  Goal: Increased Social Interaction  Outcome: Goal Ongoing  Goal: Knowledge of social interaction  Outcome: Goal Ongoing     Problem: Discharge Planning  Goal: Participation in plan of care  Outcome: Goal Ongoing

## 2021-04-24 NOTE — Progress Notes
Calm and cooperative this evening. Bright affect, out in the milieu interacting with peers, no behavioral issues thus far. Denies si/hi/avh and pain. Med and snack compliant. Will cont to monitor.

## 2021-04-24 NOTE — Progress Notes
PSYCHIATRIC PROGRESS NOTE     LOS: 38 days  Voluntary/Involuntary Status: Voluntary  Guardian? Yes  Awab, Abebe (Mother)   [phone]   Zaccary, Creech (Father)  [phone]     MEDICATIONS   Current Psychotropic Medications:   1. [name] IM every 28 days (last injection 03/23/2021)  2. Depakote ER 2500 mg QHS     ASSESSMENT & DIAGNOSES   Damian Buckles Laker?is a 25 y.o.?Caucasian?adult?with a history of schizoaffective disorder and polysubstance abuse?who presented to [facility] as a transfer from OSH?with manic symptoms in context of methamphetamine use. This appears to be precipitated by?methamphetamine use. Factors that seem to have predisposed?him?to mania?include schizoaffective disorder, substance abuse and uncle with schizophrenia. This current problem is maintained by?ongoing substance abuse, lack of consistent medication compliance, and poor insight. However, protective factors include?good support from parents who are guardians, motivation to seek treatment, and fair insight into current situation. Proposed treatment will consist of?substance detoxification and medication management.  ?  DSM-5 DIAGNOSES:  1. Stimulant use disorder, methamphetamine type, moderate  2. Methamphetamine withdrawal, resolved  3. Schizoaffective disorder, bipolar type  4. Tobacco use disorder (chewing tobacco), severe  5. Stimulant use disorder, cocaine type, unspecified severity  ?     PLAN     ? No indications for medication changes for 04/24/21.   ? Received Invega Sustenna 156 mg IM on 04/20/21  ? Continue Depakote ER PTA dose 2500 mg QHS for schizoaffective disorder and mood stabilization  ? Level 1 CARES assessment completed 6/8. Level 1 recommended Level 2 assessment.?Pending at this time.  ? SW interventions, reaching out to facilities for placement.    Discussed and seen with Dr. Sherrine Maples        Metabolic labs completed on:  Lipid Panel:   LDL   Date Value Ref Range Status   03/19/2021 106 (H) <100 mg/dL Final     HDL Date Value Ref Range Status   03/19/2021 55 >40 MG/DL Final     VLDL   Date Value Ref Range Status   03/19/2021 15 MG/DL Final     Cholesterol   Date Value Ref Range Status   03/19/2021 174 <200 MG/DL Final     Triglycerides   Date Value Ref Range Status   03/19/2021 76 <150 MG/DL Final       Blood Glucose:  Hemoglobin A1C   Date Value Ref Range Status   03/19/2021 5.3 4.0 - 6.0 % Final     Comment:     The ADA recommends that most patients with type 1 and type 2 diabetes maintain   an A1c level <7%.           ______________________________________________________________     Anastasio Auerbach Homann was seen today for follow up. Denies depression or anxiety. Slept well, denies fatigue. Appetite is good. Denies SI/HI/AVH. Denies any acute distress. Tolerating meds well.        REVIEW OF SYSTEMS   Review of Systems   Constitutional: Negative for fatigue.   Respiratory: Negative for cough and shortness of breath.    Cardiovascular: Negative for chest pain.   Gastrointestinal: Negative for constipation, diarrhea, nausea and vomiting.   Neurological: Negative for headaches.   Psychiatric/Behavioral: Negative for hallucinations and suicidal ideas.          OBJECTIVE                    Vital Signs:  Current  Vital Signs: 24 Hour Range   BP: 105/61 (06/25 0700)  Temp: 36.7 ?C (98.1 ?F) (06/25 0700)  Pulse: 57 (06/25 0700)  Respirations: 18 PER MINUTE (06/25 0700)  SpO2: 96 % (06/25 0700) BP: (105-120)/(61-71)   Temp:  [36.7 ?C (98.1 ?F)-36.8 ?C (98.2 ?F)]   Pulse:  [57-75]   Respirations:  [18 PER MINUTE]   SpO2:  [96 %-98 %]    Intensity Pain Scale (Self Report): (not recorded)      Sleep:  Hours of Sleep: 6   Quality of Sleep: Resting Quietly    Scheduled Medications:  divalproex (DEPAKOTE ER) ER tablet 2,500 mg, 2,500 mg, Oral, QHS  nicotine (NICODERM CQ STEP 1) 21 mg/day patch 1 patch, 1 patch, Transdermal, QDAY  paliperidone palmitate (INVEGA) (+) injection 156 mg, 156 mg, Intramuscular, Q28 Days (4 weeks)        PRN Medications:  acetaminophen Q6H PRN, calcium carbonate TID PRN, nicotine polacrilex Q2H PRN 4 mg at 04/23/21 1414, OLANZapine Q6H PRN **OR** OLANZapine Q6H PRN, polyethylene glycol 3350 QDAY PRN, traZODone QHS PRN 50 mg at 04/23/21 2110      Mental Status Exam:  ? General/Constitutional: 25 y.o. adult, appears stated age, dressed in?hospital?attire, fair hygiene   ? Behavior: calm, cooperative, pleasant  ? Speech/Motor: rrr with fair articulation  ? Eye Contact: good  ? Mood: fine  ? Affect: Content, mood congruent  ? Thought Process: linear and goal directed  ? Associations: Intact  ? Thought Content: Denies SI/HI. Shows no evidence of delusions or paranoia   ? Perception: Denies AVH.   Does not appear to be responding to internal stimuli   ? Insight/Judgment: fair/fair    Focused Physical Exam:  ? Musculoskeletal: able to arise from seated position without assistance  ? Neurological: no UE tremor  ? Pulm: Patient is respirating in no apparent distress, no cough   ______________________________________________________________  Floreen Comber, DO

## 2021-04-24 NOTE — Progress Notes
Chart check completed.

## 2021-04-25 NOTE — Progress Notes
Patient calm and cooperative. Patient denies SI, HI, AVH, and pain. Denies anxiety and depression. Patient compliant with scheduled evening medications. Will continue to monitor and observe

## 2021-04-25 NOTE — Progress Notes
PSYCHIATRIC PROGRESS NOTE     LOS: 39 days  Voluntary/Involuntary Status: Voluntary  Guardian? Yes  Jamie, [name] (Mother)   [phone]   Jamie, [name] (Father)  [phone]     MEDICATIONS   Current Psychotropic Medications:   1. Jamie [name] Sustenna IM every 28 days (last injection 03/23/2021)  2. Depakote ER 2500 mg QHS     ASSESSMENT & DIAGNOSES   Jamie [name]?is a 25 y.o.?Caucasian?adult?with a history of schizoaffective disorder and polysubstance abuse?who presented to [facility] as a transfer from OSH?with manic symptoms in context of methamphetamine use. This appears to be precipitated by?methamphetamine use. Factors that seem to have predisposed?him?to mania?include schizoaffective disorder, substance abuse and uncle with schizophrenia. This current problem is maintained by?ongoing substance abuse, lack of consistent medication compliance, and poor insight. However, protective factors include?good support from parents who are guardians, motivation to seek treatment, and fair insight into current situation. Proposed treatment will consist of?substance detoxification and medication management.  ?  DSM-5 DIAGNOSES:  1. Stimulant use disorder, methamphetamine type, moderate  2. Methamphetamine withdrawal, resolved  3. Schizoaffective disorder, bipolar type  4. Tobacco use disorder (chewing tobacco), severe  5. Stimulant use disorder, cocaine type, unspecified severity     PLAN     ? No indications for medication changes for 04/25/21.   ? Received Invega Sustenna 156 mg IM on 04/20/21  ? Continue Depakote ER PTA dose 2500 mg QHS for schizoaffective disorder and mood stabilization  ? Level 1 CARES assessment completed 6/8. Level 1 recommended Level 2 assessment.?Pending at this time.  ? SW interventions, reaching out to facilities for placement.    Discussed and seen with Dr. Sherrine Maples        Metabolic labs completed on:  Lipid Panel:   LDL   Date Value Ref Range Status   03/19/2021 106 (H) <100 mg/dL Final     HDL Date Value Ref Range Status   03/19/2021 55 >40 MG/DL Final     VLDL   Date Value Ref Range Status   03/19/2021 15 MG/DL Final     Cholesterol   Date Value Ref Range Status   03/19/2021 174 <200 MG/DL Final     Triglycerides   Date Value Ref Range Status   03/19/2021 76 <150 MG/DL Final       Blood Glucose:  Hemoglobin A1C   Date Value Ref Range Status   03/19/2021 5.3 4.0 - 6.0 % Final     Comment:     The ADA recommends that most patients with type 1 and type 2 diabetes maintain   an A1c level <7%.           ______________________________________________________________     Jamie [name] was seen today for follow up. Denies depression or anxiety. Slept well, denies fatigue. Appetite is good. Denies SI/HI/AVH. Denies any acute distress. Tolerating meds well.        REVIEW OF SYSTEMS   Review of Systems   Constitutional: Negative for fatigue.   Respiratory: Negative for cough and shortness of breath.    Cardiovascular: Negative for chest pain.   Gastrointestinal: Negative for constipation, diarrhea, nausea and vomiting.   Neurological: Negative for headaches.   Psychiatric/Behavioral: Negative for hallucinations and suicidal ideas.          OBJECTIVE                    Vital Signs:  Current  Vital Signs: 24 Hour Range   BP: 111/64 (06/26 0710)  Temp: 36.6 ?C (97.9 ?F) (06/26 0710)  Pulse: 54 (06/26 0710)  Respirations: 18 PER MINUTE (06/26 0710)  SpO2: 96 % (06/26 0710) BP: (111-125)/(64-72)   Temp:  [36.6 ?C (97.9 ?F)-36.8 ?C (98.2 ?F)]   Pulse:  [54-71]   Respirations:  [18 PER MINUTE]   SpO2:  [96 %-99 %]    Intensity Pain Scale (Self Report): (not recorded)      Sleep:  Hours of Sleep: 6.5   Quality of Sleep: Resting Quietly    Scheduled Medications:  divalproex (DEPAKOTE ER) ER tablet 2,500 mg, 2,500 mg, Oral, QHS  nicotine (NICODERM CQ STEP 1) 21 mg/day patch 1 patch, 1 patch, Transdermal, QDAY  paliperidone palmitate (INVEGA) (+) injection 156 mg, 156 mg, Intramuscular, Q28 Days (4 weeks)        PRN Medications:  acetaminophen Q6H PRN, calcium carbonate TID PRN, nicotine polacrilex Q2H PRN 4 mg at 04/23/21 1414, OLANZapine Q6H PRN **OR** OLANZapine Q6H PRN, polyethylene glycol 3350 QDAY PRN, traZODone QHS PRN 50 mg at 04/24/21 2057      Mental Status Exam:  ? General/Constitutional: 25 y.o. adult, appears stated age, dressed in?hospital?attire, fair hygiene   ? Behavior: calm, cooperative, pleasant  ? Speech/Motor: rrr with fair articulation  ? Eye Contact: good  ? Mood: fine  ? Affect: Content, mood congruent  ? Thought Process: linear and goal directed  ? Associations: Intact  ? Thought Content: Denies SI/HI. Shows no evidence of delusions or paranoia   ? Perception: Denies AVH.   Does not appear to be responding to internal stimuli   ? Insight/Judgment: fair/fair    Focused Physical Exam:  ? Musculoskeletal: able to arise from seated position without assistance  ? Neurological: no UE tremor  ? Pulm: Patient is respirating in no apparent distress, no cough   ______________________________________________________________  [name], MD

## 2021-04-25 NOTE — Progress Notes
Chart check completed.

## 2021-04-25 NOTE — Care Plan
Ayaz is compliant with medications.  Pt denies AH.  Pt states he has visual hallucinations of floaters and red, blue, and green dots.  Pt denies SIHISH.  Pt states slept pretty good, and his appetite is good.  Pt c/o mood is good and has a positive outlook on things.  Pt denies anxiety and depression.  Pt denies pain.  Pt encouraged to attend groups and denies any questions or concerns at this time.    Problem: Health Maintenance - Impaired  Goal: Establish therapeutic relationships  Outcome: Goal Ongoing  Goal: Knowledge of health maintenance  Outcome: Goal Ongoing     Problem: Coping - Ineffective, Family  Goal: Effective Coping  Outcome: Goal Ongoing  Goal: Communicate with support staff  Outcome: Goal Ongoing  Goal: Knowledge of positive coping methods  Outcome: Goal Ongoing     Problem: Social Interaction - Impaired  Goal: Increased Social Interaction  Outcome: Goal Ongoing  Goal: Knowledge of social interaction  Outcome: Goal Ongoing     Problem: Discharge Planning  Goal: Participation in plan of care  Outcome: Goal Ongoing

## 2021-04-26 NOTE — Progress Notes
Chart check completed

## 2021-04-26 NOTE — Progress Notes
[facility]  Therapy Progress Note    NAME:Jamie [name] MRN: [id number] DOB:05/17/1996 AGE: 25 y.o.  ADMISSION DATE: 03/16/2021 DAYS ADMITTED: LOS: 40 days    Date of Service:  04/26/21    Active Problems:    Schizoaffective disorder, bipolar type (HCC)    Tobacco use disorder, severe, dependence    Withdrawal from methamphetamine [facility])    Methamphetamine use disorder, moderate (HCC)    Objective:  Therapist approached Patient as he was walking up/down the hallway in order to "get in my steps."  He agreed that Therapist could join him.  Eye contact WNL.  Affect full; mood congruent.  He displayed an appropriate sense of humor.    Narrative:  Patient expressed mild concern about placement.  He emphasized that he is trying to keep a positive attitude and highlighted his attendance in groups.  Therapist reinforced that Patient's overall functioning has been positive; he acknowledged that past admissions were marked with more manic behavior.      When Therapist compared a longer stay in an appropriate facility to a retirement investment, Patient appreciated the difference between short-term and long-term gains.  He also emphasized the importance of ongoing, honest communication between him and his parents/guardians.       Follow up:  Treatment Team will continue to follow.

## 2021-04-26 NOTE — Behavioral Health Treatment Team
TREATMENT TEAM NOTE  Name: Jamie [name]        MRN: [id number]          DOB: April 16, 1996            Age: 25 y.o.  Admission Date: 03/16/2021       LOS: 40 days    Date of Service: 04/26/2021        Attendees:  Nursing: Romilda Joy, BSN, RN  Therapy: Clement Husbands, LSCSW, [facility]  Therapy: Marijean Heath, LMLP, LCP  CM: Henderson Cloud, LMSW  CM: Neysa Bonito, LPC  CM: Berle Mull, [facility], MT-BC  Activities Coordinator: Philippa Chester  Pharmacy: Blinda Leatherwood, PharmD  Pharmacy: Shelda Altes, PharmD  Psychology: Peter Minium, PhD  Psychology: Madie Reno, MS  Psychology:Angela Belva Agee, MS  Psychiatry: Shella Maxim. Sherrine Maples, DO  Psychiatry: Frankey Poot, APRN-NP  Psychiatry Resident: Karie Fetch, MD   Psychiatry Resident: Milana Kidney, DO       Significant Points Discussed: No updates    Treatment Plan Discussion: No updates

## 2021-04-26 NOTE — Progress Notes
PSYCHIATRIC PROGRESS NOTE     LOS: 40 days  Voluntary/Involuntary Status: Voluntary  Guardian? Yes  Jahsier, Rittenour (Mother)   [phone]   Vashon, Marsico (Father)  [phone]     MEDICATIONS   Current Psychotropic Medications:   1. [name] IM every 28 days (last injection 03/23/2021)  2. Depakote ER 2500 mg QHS     ASSESSMENT & DIAGNOSES   Devontay Gerstenberger Goldammer?is a 25 y.o.?Caucasian?adult?with a history of schizoaffective disorder and polysubstance abuse?who presented to [facility] as a transfer from OSH?with manic symptoms in context of methamphetamine use. This appears to be precipitated by?methamphetamine use. Factors that seem to have predisposed?him?to mania?include schizoaffective disorder, substance abuse and uncle with schizophrenia. This current problem is maintained by?ongoing substance abuse, lack of consistent medication compliance, and poor insight. However, protective factors include?good support from parents who are guardians, motivation to seek treatment, and fair insight into current situation. Proposed treatment will consist of?substance detoxification and medication management.  ?  DSM-5 DIAGNOSES:  1. Stimulant use disorder, methamphetamine type, moderate  2. Methamphetamine withdrawal, resolved  3. Schizoaffective disorder, bipolar type  4. Tobacco use disorder (chewing tobacco), severe  5. Stimulant use disorder, cocaine type, unspecified severity     PLAN     ? No indications for medication changes for 04/26/21.   ? Received Invega Sustenna 156 mg IM on 04/20/21  ? Continue Depakote ER PTA dose 2500 mg QHS for schizoaffective disorder and mood stabilization  ? Level 1 CARES assessment completed 6/8. Level 1 recommended Level 2 assessment.?Pending at this time.  ? SW interventions, reaching out to facilities for placement.    Discussed and seen with Dr. Sherrine Maples        Metabolic labs completed on:  Lipid Panel:   LDL   Date Value Ref Range Status   03/19/2021 106 (H) <100 mg/dL Final     HDL Date Value Ref Range Status   03/19/2021 55 >40 MG/DL Final     VLDL   Date Value Ref Range Status   03/19/2021 15 MG/DL Final     Cholesterol   Date Value Ref Range Status   03/19/2021 174 <200 MG/DL Final     Triglycerides   Date Value Ref Range Status   03/19/2021 76 <150 MG/DL Final       Blood Glucose:  Hemoglobin A1C   Date Value Ref Range Status   03/19/2021 5.3 4.0 - 6.0 % Final     Comment:     The ADA recommends that most patients with type 1 and type 2 diabetes maintain   an A1c level <7%.       ______________________________________________________________     Anastasio Auerbach Piontek was seen today for follow up. Denies depression or anxiety. Slept well, denies fatigue. Appetite is good. Denies SI/HI/AVH. Denies any acute distress. Tolerating meds well.     Per nursing overnight: Pt observed in milieu interacting well with staff and peers. Pt mood appears stable, with pt denying being depressed or anxious. Pt denies any SI, HI, AH or VH. He denies somatic complaints of pain. He has had no behavioral concerns this shift.     REVIEW OF SYSTEMS   Review of Systems   Constitutional: Negative for fatigue.   Respiratory: Negative for cough and shortness of breath.    Cardiovascular: Negative for chest pain.   Gastrointestinal: Negative for constipation, diarrhea, nausea and vomiting.   Neurological: Negative for headaches.   Psychiatric/Behavioral: Negative for hallucinations  and suicidal ideas.          OBJECTIVE                    Vital Signs:  Current                Vital Signs: 24 Hour Range   BP: 108/68 (06/26 2001)  Temp: 36.9 ?C (98.4 ?F) (06/26 2001)  Pulse: 68 (06/26 2001)  Respirations: 16 PER MINUTE (06/26 2001)  SpO2: 100 % (06/26 2001) BP: (108)/(68)   Temp:  [36.9 ?C (98.4 ?F)]   Pulse:  [68]   Respirations:  [16 PER MINUTE]   SpO2:  [100 %]    Intensity Pain Scale (Self Report): (not recorded)      Sleep:  Hours of Sleep: 6.5   Quality of Sleep: Resting Quietly    Scheduled Medications:  divalproex (DEPAKOTE ER) ER tablet 2,500 mg, 2,500 mg, Oral, QHS  nicotine (NICODERM CQ STEP 1) 21 mg/day patch 1 patch, 1 patch, Transdermal, QDAY  paliperidone palmitate (INVEGA) (+) injection 156 mg, 156 mg, Intramuscular, Q28 Days (4 weeks)        PRN Medications:  acetaminophen Q6H PRN, calcium carbonate TID PRN, nicotine polacrilex Q2H PRN 4 mg at 04/25/21 1545, OLANZapine Q6H PRN **OR** OLANZapine Q6H PRN, polyethylene glycol 3350 QDAY PRN, traZODone QHS PRN 50 mg at 04/24/21 2057    Mental Status Exam:  ? General/Constitutional: 25 y.o. adult, appears stated age, dressed in?hospital?attire, fair hygiene   ? Behavior: calm, cooperative, pleasant  ? Speech/Motor: rrr with fair articulation  ? Eye Contact: good  ? Mood: fine  ? Affect: Content, mood congruent  ? Thought Process: linear and goal directed  ? Associations: Intact  ? Thought Content: Denies SI/HI. Shows no evidence of delusions or paranoia   ? Perception: Denies AVH.   Does not appear to be responding to internal stimuli   ? Insight/Judgment: fair/fair    Focused Physical Exam:  ? Musculoskeletal: able to arise from seated position without assistance  ? Neurological: no UE tremor  ? Pulm: Patient is respirating in no apparent distress, no cough   ______________________________________________________________  [name], MD

## 2021-04-26 NOTE — ED Notes
Pt observed in milieu interacting well with staff and peers. Pt mood appears stable, with pt denying being depressed or anxious. Pt denies any SI, HI, AH or VH. He denies somatic complaints of pain. He has had no behavioral concerns this shift. Pt will continue to be monitored on q 15 safety checks. Pt was compliant with HS medications.

## 2021-04-26 NOTE — Progress Notes
Chart check completed.

## 2021-04-26 NOTE — Care Plan
Patient visible in day room interacting with staff and peers appropriately. No changes or new needs this shift. Compliant with medication and cooperative with care. Will continue to monitor.   Problem: Health Maintenance - Impaired  Goal: Establish therapeutic relationships  Outcome: Goal Ongoing  Goal: Knowledge of health maintenance  Outcome: Goal Ongoing     Problem: Coping - Ineffective, Family  Goal: Effective Coping  Outcome: Goal Ongoing  Goal: Communicate with support staff  Outcome: Goal Ongoing  Goal: Knowledge of positive coping methods  Outcome: Goal Ongoing     Problem: Social Interaction - Impaired  Goal: Increased Social Interaction  Outcome: Goal Ongoing  Goal: Knowledge of social interaction  Outcome: Goal Ongoing     Problem: Discharge Planning  Goal: Participation in plan of care  Outcome: Goal Ongoing

## 2021-04-27 NOTE — Progress Notes
PSYCHIATRIC NURSING ASSESSMENT     NAME:Jamie [name]             MRN: [id number]             DOB:09/11/1996          AGE: 25 y.o.  ADMISSION DATE: 03/16/2021             DAYS ADMITTED: LOS: 41 days    Mental Status Exam  Legal Status: Voluntary Admission  General Appearance: Normal  Mood / Affect: Congruent  Speech: Normal  Content Of Thought: Normal  Motor Activity: Normal  Flow of Thought: Normal  Sensorium: Normal  Insight / Judgment: Fair judgment, Fair insight  Behavior: Calm, Cooperative, Follows commands, Normal  Patient Strengths: Setting and pursuing goals, Motivation and readiness for change, Managing surrounding demands and opportunities, Knowledge of medications, Exercising self-direction    Garrin denies depression, anxiety, SI/HI, AVH, and pain. Slept most of the day, attends some groups. Chats with staff frequently. Pt paces the hallway. Reports enjoying going to the exercise room today. Pt said that he is trying to "stay positive" about looking for placement. PRN nicotine gum administered per orders for cravings.     Encouraged him to come to staff with concerns. Pt is on 15 minute checks to monitor for safety and behaviors.    Renee Ramus, RN  04/27/2021

## 2021-04-27 NOTE — Care Coordination-Inpatient
Care Coordination - Inpatient Note      Patient Name:   Jamie [name]                        MRN:  [id number]  Admission Date:  03/16/2021    12:26pm  Writer contacted [facility] at [phone], requested to speak to [facility] regarding referral packet sent on 6/21. Judeth Cornfield reported nursing "was okay" with the referral and that therapy needed to review it. Writer informed Judeth Cornfield that she would follow up Thursday 6/30.     1:37pm  Writer contacted Melody at [facility] via e-mail inquired an update regarding the referral sent on 6/21.    Lynell Kussman  04/27/2021

## 2021-04-27 NOTE — Progress Notes
Chart check completed

## 2021-04-27 NOTE — Progress Notes
At 23:00, report received from [facility]. Safety round completed. Patient sleeping without distress. Breathing WNL. Chest rising and falling symmetrically. No unsafe behavior noted at this time. Will cont to monitor

## 2021-04-27 NOTE — Care Plan
Problem: Health Maintenance - Impaired  Goal: Establish therapeutic relationships  Outcome: Goal Ongoing  Goal: Knowledge of health maintenance  Outcome: Goal Ongoing     Problem: Coping - Ineffective, Family  Goal: Effective Coping  Outcome: Goal Ongoing  Goal: Communicate with support staff  Outcome: Goal Ongoing  Goal: Knowledge of positive coping methods  Outcome: Goal Ongoing     Problem: Social Interaction - Impaired  Goal: Increased Social Interaction  Outcome: Goal Ongoing  Goal: Knowledge of social interaction  Outcome: Goal Ongoing     Problem: Discharge Planning  Goal: Participation in plan of care  Outcome: Goal Ongoing

## 2021-04-27 NOTE — Care Plan
Problem: Health Maintenance - Impaired  Goal: Establish therapeutic relationships  04/27/2021 1000 by Renee Ramus, RN  Outcome: Goal Ongoing  04/26/2021 2152 by Renee Ramus, RN  Outcome: Goal Ongoing  Goal: Knowledge of health maintenance  04/27/2021 1000 by Renee Ramus, RN  Outcome: Goal Ongoing  04/26/2021 2152 by Renee Ramus, RN  Outcome: Goal Ongoing     Problem: Coping - Ineffective, Family  Goal: Effective Coping  04/27/2021 1000 by Renee Ramus, RN  Outcome: Goal Ongoing  04/26/2021 2152 by Renee Ramus, RN  Outcome: Goal Ongoing  Goal: Communicate with support staff  04/27/2021 1000 by Renee Ramus, RN  Outcome: Goal Ongoing  04/26/2021 2152 by Renee Ramus, RN  Outcome: Goal Ongoing  Goal: Knowledge of positive coping methods  04/27/2021 1000 by Renee Ramus, RN  Outcome: Goal Ongoing  04/26/2021 2152 by Renee Ramus, RN  Outcome: Goal Ongoing     Problem: Social Interaction - Impaired  Goal: Increased Social Interaction  04/27/2021 1000 by Renee Ramus, RN  Outcome: Goal Ongoing  04/26/2021 2152 by Renee Ramus, RN  Outcome: Goal Ongoing  Goal: Knowledge of social interaction  04/27/2021 1000 by Renee Ramus, RN  Outcome: Goal Ongoing  04/26/2021 2152 by Renee Ramus, RN  Outcome: Goal Ongoing     Problem: Discharge Planning  Goal: Participation in plan of care  04/27/2021 1000 by Renee Ramus, RN  Outcome: Goal Ongoing  04/26/2021 2152 by Renee Ramus, RN  Outcome: Goal Ongoing

## 2021-04-27 NOTE — Behavioral Health Treatment Team
TREATMENT TEAM NOTE  Name: Jamie [name]        MRN: [id number]          DOB: 1996-04-04            Age: 25 y.o.  Admission Date: 03/16/2021       LOS: 41 days    Date of Service: 04/27/2021        Attendees:  Nursing: Doristine Mango, RN  Therapy: Clement Husbands, LSCSW, [facility]  Therapy: Marijean Heath, LMLP, LCP  CM: Henderson Cloud, LMSW  CM: Neysa Bonito, LPC  CM: Eden Lathe, LPC  CM: Berle Mull, [facility], MT-BC  Clinical Manager: Lacy Duverney, LSCSW  Pharmacy: Royetta Crochet, PharmD  Psychology: Assunta Gambles. Lowry Bowl, PhD  Psychiatry Resident: Milana Kidney, DO  Psychiatry: Dr. Sherrine Maples, DO  Psychiatry: Nolon Rod, APRN       Significant Points Discussed: medication compliant, calm and cooperative on the milieu, reaching out to [facility], sending out referrals for placement    Treatment Plan Discussion: continue to engage patient in tx

## 2021-04-27 NOTE — Progress Notes
PSYCHIATRIC PROGRESS NOTE     LOS: 41 days  Voluntary/Involuntary Status: Voluntary  Guardian? Yes  Johnathon, Olden (Mother)   [phone]   Jowan, Skillin (Father)  [phone]     MEDICATIONS   Current Psychotropic Medications:   1. [name] IM every 28 days (last injection 03/23/2021)  2. Depakote ER 2500 mg QHS     ASSESSMENT & DIAGNOSES   Vishaal Strollo Dewitt?is a 25 y.o.?Caucasian?adult?with a history of schizoaffective disorder and polysubstance abuse?who presented to [facility] as a transfer from OSH?with manic symptoms in context of methamphetamine use. This appears to be precipitated by?methamphetamine use. Factors that seem to have predisposed?him?to mania?include schizoaffective disorder, substance abuse and uncle with schizophrenia. This current problem is maintained by?ongoing substance abuse, lack of consistent medication compliance, and poor insight. However, protective factors include?good support from parents who are guardians, motivation to seek treatment, and fair insight into current situation. Proposed treatment will consist of?substance detoxification and medication management.  ?  DSM-5 DIAGNOSES:  1. Stimulant use disorder, methamphetamine type, moderate  2. Methamphetamine withdrawal, resolved  3. Schizoaffective disorder, bipolar type  4. Tobacco use disorder (chewing tobacco), severe  5. Stimulant use disorder, cocaine type, unspecified severity     PLAN     ? No indications for medication changes for 04/27/21.   ? Received Invega Sustenna 156 mg IM on 04/20/21  ? Continue Depakote ER PTA dose 2500 mg QHS for schizoaffective disorder and mood stabilization  ? Level 1 CARES assessment completed 6/8. Level 1 recommended Level 2 assessment.?Pending at this time.  ? SW interventions, reaching out to facilities for placement.    Discussed and seen with Dr. Sherrine Maples        Metabolic labs completed on:  Lipid Panel:   LDL   Date Value Ref Range Status   03/19/2021 106 (H) <100 mg/dL Final     HDL Date Value Ref Range Status   03/19/2021 55 >40 MG/DL Final     VLDL   Date Value Ref Range Status   03/19/2021 15 MG/DL Final     Cholesterol   Date Value Ref Range Status   03/19/2021 174 <200 MG/DL Final     Triglycerides   Date Value Ref Range Status   03/19/2021 76 <150 MG/DL Final       Blood Glucose:  Hemoglobin A1C   Date Value Ref Range Status   03/19/2021 5.3 4.0 - 6.0 % Final     Comment:     The ADA recommends that most patients with type 1 and type 2 diabetes maintain   an A1c level <7%.       ______________________________________________________________     Anastasio Auerbach Ewell was seen today for follow up. Reports sleep is good and denies fatigue. Appetite is good. Denies SI/HI/AVH. Tolerating medications well.     Participated in simulated interview today with attending and participated appropriately.        REVIEW OF SYSTEMS   Review of Systems   Constitutional: Negative for fatigue.   Respiratory: Negative for cough and shortness of breath.    Cardiovascular: Negative for chest pain.   Gastrointestinal: Negative for constipation, diarrhea, nausea and vomiting.   Neurological: Negative for headaches.   Psychiatric/Behavioral: Negative for hallucinations and suicidal ideas.          OBJECTIVE                    Vital Signs:  Current  Vital Signs: 24 Hour Range   BP: 103/50 (06/28 0700)  Temp: 36.8 ?C (98.2 ?F) (06/28 0700)  Pulse: 65 (06/28 0700)  Respirations: 18 PER MINUTE (06/28 0700)  SpO2: 95 % (06/28 0700) BP: (103-125)/(50-71)   Temp:  [36.8 ?C (98.2 ?F)-36.9 ?C (98.4 ?F)]   Pulse:  [65-88]   Respirations:  [18 PER MINUTE]   SpO2:  [95 %-98 %]    Intensity Pain Scale (Self Report): (not recorded)      Sleep:  Hours of Sleep: 6   Quality of Sleep: Resting Quietly    Scheduled Medications:  divalproex (DEPAKOTE ER) ER tablet 2,500 mg, 2,500 mg, Oral, QHS  nicotine (NICODERM CQ STEP 1) 21 mg/day patch 1 patch, 1 patch, Transdermal, QDAY  paliperidone palmitate (INVEGA) (+) injection 156 mg, 156 mg, Intramuscular, Q28 Days (4 weeks)        PRN Medications:  acetaminophen Q6H PRN, calcium carbonate TID PRN, nicotine polacrilex Q2H PRN 4 mg at 04/26/21 2147, OLANZapine Q6H PRN **OR** OLANZapine Q6H PRN, polyethylene glycol 3350 QDAY PRN, traZODone QHS PRN 50 mg at 04/24/21 2057    Mental Status Exam:  ? General/Constitutional: 25 y.o. adult, appears stated age, dressed in?hospital?attire, fair hygiene   ? Behavior: calm, cooperative, pleasant  ? Speech/Motor: rrr with fair articulation  ? Eye Contact: good  ? Mood: okay  ? Affect: Content, mood congruent  ? Thought Process: linear and goal directed  ? Associations: Intact  ? Thought Content: Denies SI/HI. Shows no evidence of delusions or paranoia   ? Perception: Denies AVH.   Does not appear to be responding to internal stimuli   ? Insight/Judgment: fair/fair    Focused Physical Exam:  ? Musculoskeletal: able to arise from seated position without assistance  ? Neurological: no UE tremor  ? Pulm: Patient is respirating in no apparent distress, no cough  ? Gait: Ambulates independently without difficulty   ______________________________________________________________  Floreen Comber, DO

## 2021-04-27 NOTE — Progress Notes
PSYCHIATRIC NURSING ASSESSMENT     NAME:Jamie [name]             MRN: [id number]             DOB:1995/12/03          AGE: 25 y.o.  ADMISSION DATE: 03/16/2021             DAYS ADMITTED: LOS: 40 days    Mental Status Exam  Legal Status: Voluntary Admission  General Appearance: Normal  Mood / Affect: Congruent  Speech: Normal  Content Of Thought: Normal  Motor Activity: Normal  Flow of Thought: Normal  Sensorium: Normal  Insight / Judgment: Fair judgment, Fair insight  Behavior: Calm, Cooperative, Follows commands, Normal  Patient Strengths: Assessment of patient optimism that change can occur, Exercising self-direction, Knowledge of medications, Managing surrounding demands and opportunities, Motivation and readiness for change, Setting and pursuing goals    Coran denies depression, anxiety, SI/HI, AVH, and pain. He is at baseline as treatment team is looking for placement. Pt was pacing hallways and chatting with peers.    Monitored on 15 minute checks. Encouraged him to come to staff with concerns.     Renee Ramus, RN  04/26/2021

## 2021-04-28 NOTE — Behavioral Health Treatment Team
TREATMENT TEAM NOTE  Name: Riyan Haile          MRN: [id number]              DOB: 1995-12-29          Age: 25 y.o.  Admission Date: 03/16/2021                 Attendees:   Nursing: Freddie Apley, RN, BSN  Therapy: Clement Husbands, LSCSW, [facility]  Therapy: Marijean Heath, LMLP, LCP  CM: Neysa Bonito, LPC  CM: Eden Lathe, LPC  CM: Berle Mull, [facility], MT-BC  Art Therapy: Judi Cong, Lorrin Jackson, [facility]  Nurse Manager: Ursula Beath, RN  Pharmacy: Blinda Leatherwood, PharmD  Psychology: Assunta Gambles. Lowry Bowl, PhD  Psychiatry: Shella Maxim. Sherrine Maples, DO  Psychiatry: Frankey Poot, APRN-NP    Significant points discussed: Denies all psych, awaiting placement    Potential discharge date: TBD

## 2021-04-28 NOTE — Care Plan
Problem: Health Maintenance - Impaired  Goal: Establish therapeutic relationships  Outcome: Goal Ongoing  Goal: Knowledge of health maintenance  Outcome: Goal Ongoing     Problem: Coping - Ineffective, Family  Goal: Effective Coping  Outcome: Goal Ongoing  Goal: Communicate with support staff  Outcome: Goal Ongoing  Goal: Knowledge of positive coping methods  Outcome: Goal Ongoing     Problem: Social Interaction - Impaired  Goal: Increased Social Interaction  Outcome: Goal Ongoing  Goal: Knowledge of social interaction  Outcome: Goal Ongoing     Problem: Discharge Planning  Goal: Participation in plan of care  Outcome: Goal Ongoing

## 2021-04-28 NOTE — Progress Notes
Chart check complete

## 2021-04-28 NOTE — Progress Notes
Patient is visible in milieu watching television with peers. Medication compliant. Voiced no concerns with this RN. Denies si/hi/avh.

## 2021-04-28 NOTE — Progress Notes
PSYCHIATRIC PROGRESS NOTE     LOS: 42 days  Voluntary/Involuntary Status: Voluntary  Guardian? Yes  Lee, Kuang (Mother)   [phone]   Clanton, Emanuelson (Father)  [phone]     MEDICATIONS   Current Psychotropic Medications:   1. [name] IM every 28 days (last injection 03/23/2021)  2. Depakote ER 2500 mg QHS     ASSESSMENT & DIAGNOSES   Jamie [name]?is a 25 y.o.?Caucasian?adult?with a history of schizoaffective disorder and polysubstance abuse?who presented to [facility] as a transfer from OSH?with manic symptoms in context of methamphetamine use. This appears to be precipitated by?methamphetamine use. Factors that seem to have predisposed?him?to mania?include schizoaffective disorder, substance abuse and uncle with schizophrenia. This current problem is maintained by?ongoing substance abuse, lack of consistent medication compliance, and poor insight. However, protective factors include?good support from parents who are guardians, motivation to seek treatment, and fair insight into current situation. Proposed treatment will consist of?substance detoxification and medication management.    DSM-5 DIAGNOSES:  1. Stimulant use disorder, methamphetamine type, moderate  2. Methamphetamine withdrawal, resolved  3. Schizoaffective disorder, bipolar type  4. Tobacco use disorder (chewing tobacco), severe  5. Stimulant use disorder, cocaine type, unspecified severity     PLAN     ? No indications for medication changes for 04/27/21.   ? Received Invega Sustenna 156 mg IM on 04/20/21  ? Continue Depakote ER PTA dose 2500 mg QHS for schizoaffective disorder and mood stabilization  ? Level 1 CARES assessment completed 6/8. Level 1 recommended Level 2 assessment.?Pending at this time.  ? SW interventions, reaching out to facilities for placement.    Discussed and seen with Dr. Sherrine Maples        Metabolic labs completed on:  Lipid Panel:   LDL   Date Value Ref Range Status   03/19/2021 106 (H) <100 mg/dL Final     HDL Date Value Ref Range Status   03/19/2021 55 >40 MG/DL Final     VLDL   Date Value Ref Range Status   03/19/2021 15 MG/DL Final     Cholesterol   Date Value Ref Range Status   03/19/2021 174 <200 MG/DL Final     Triglycerides   Date Value Ref Range Status   03/19/2021 76 <150 MG/DL Final       Blood Glucose:  Hemoglobin A1C   Date Value Ref Range Status   03/19/2021 5.3 4.0 - 6.0 % Final     Comment:     The ADA recommends that most patients with type 1 and type 2 diabetes maintain   an A1c level <7%.       ______________________________________________________________     Jamie [name] was seen today for follow up. Reports sleep is good and denies fatigue. Appetite is good. Denies SI/HI/AVH. Tolerating medications well.      REVIEW OF SYSTEMS   Review of Systems   Constitutional: Negative for fatigue.   Respiratory: Negative for cough and shortness of breath.    Cardiovascular: Negative for chest pain.   Gastrointestinal: Negative for constipation, diarrhea, nausea and vomiting.   Neurological: Negative for headaches.   Psychiatric/Behavioral: Negative for hallucinations and suicidal ideas.          OBJECTIVE                    Vital Signs:  Current                Vital Signs: 24  Hour Range   BP: 101/49 (06/29 0700)  Temp: 36.9 ?C (98.4 ?F) (06/29 0700)  Pulse: 64 (06/29 0700)  Respirations: 14 PER MINUTE (06/29 0700)  SpO2: 96 % (06/29 0700)  O2 Device: None (Room air) (06/28 1920) BP: (101-113)/(49-67)   Temp:  [36.6 ?C (97.9 ?F)-36.9 ?C (98.4 ?F)]   Pulse:  [64-98]   Respirations:  [14 PER MINUTE-18 PER MINUTE]   SpO2:  [96 %-98 %]   O2 Device: None (Room air)   Intensity Pain Scale (Self Report): (not recorded)      Sleep:  Hours of Sleep: 6.75   Quality of Sleep: Resting Quietly    Scheduled Medications:  divalproex (DEPAKOTE ER) ER tablet 2,500 mg, 2,500 mg, Oral, QHS  nicotine (NICODERM CQ STEP 1) 21 mg/day patch 1 patch, 1 patch, Transdermal, QDAY  paliperidone palmitate (INVEGA) (+) injection 156 mg, 156 mg, Intramuscular, Q28 Days (4 weeks)        PRN Medications:  acetaminophen Q6H PRN, calcium carbonate TID PRN, nicotine polacrilex Q2H PRN 4 mg at 04/27/21 2033, OLANZapine Q6H PRN **OR** OLANZapine Q6H PRN, polyethylene glycol 3350 QDAY PRN, traZODone QHS PRN 50 mg at 04/27/21 2124    Mental Status Exam:  ? General/Constitutional: 25 y.o. adult, appears stated age, dressed in?hospital?attire, fair hygiene   ? Behavior: calm, cooperative, pleasant  ? Speech/Motor: rrr with fair articulation  ? Eye Contact: good  ? Mood: okay  ? Affect: Content, mood congruent  ? Thought Process: linear and goal directed  ? Associations: Intact  ? Thought Content: Denies SI/HI. Shows no evidence of delusions or paranoia   ? Perception: Denies AVH.   Does not appear to be responding to internal stimuli   ? Insight/Judgment: fair/fair    Focused Physical Exam:  ? Musculoskeletal: able to arise from seated position without assistance  ? Neurological: no UE tremor  ? Pulm: Patient is respirating in no apparent distress, no cough  ? Gait: Ambulates independently without difficulty   ______________________________________________________________  [name], MD

## 2021-04-28 NOTE — Progress Notes
PSYCHIATRIC NURSING ASSESSMENT     NAME:Jamie [name]             MRN: [id number]             DOB:1996/03/06          AGE: 25 y.o.  ADMISSION DATE: 03/16/2021             DAYS ADMITTED: LOS: 42 days    Mental Status Exam  Legal Status: Voluntary Admission  General Appearance: Normal  Mood / Affect: Congruent  Speech: Normal  Content Of Thought: Normal  Motor Activity: Normal  Flow of Thought: Normal  Sensorium: Normal  Insight / Judgment: Fair insight, Fair judgment  Behavior: Calm, Follows commands, Cooperative, Normal  Patient Strengths: Exercising self-direction, Knowledge of medications, Managing surrounding demands and opportunities, Motivation and readiness for change    Blue denies depression, anxiety, SI/HI, AVH, and pain. He alternates between sleeping in his room and pacing the hallways. He has been eating all meals. Pt talks with staff a lot during the day. He is polite and pleasant. He says that he is feeling "alright" about his parents coming for a visit this evening. PRN nicotine gum administered per orders for cravings.     Encouraged him to come to staff with concerns. Pt is on 15 minute checks to monitor for safety and behaviors.     Renee Ramus, RN  04/28/2021

## 2021-04-29 NOTE — Progress Notes
Chart check completed

## 2021-04-29 NOTE — Progress Notes
Pt was observed with congruent affect. Pt appeared to have WNL thought.  Pt was  oriented to person, place, time, situation. Pt did not appear to be responding to internal stimuli.  Pt did not appear to be making delusional statements.       Pt appeared to be calm and cooperative. Pt was observed spending the majority of the shift in the day room watching television.  Pt appeared to be calm and cooperative and appeared to be appropriately interactive with staff.     Depression/Anxiety: Pt rated depression as being 0/10 and anxiety being 0/10.     Suicidal Ideation/Homicidal ideation: Pt  denies suicidal ideation. Pt denies homicidal ideations. Pt reports  being able to contract for safety.     Audio/Visual Hallucinations:  Pt reports audio/ visual hallucinations.   Pt reported having audio visual hallucinations in the form of, ' seeing an overlay of the world with stars and red and blue green dots.' Pt stated that this was not new and that he is, 'used to it.'     Pt Vital signs were assessed:  Vitals*  Pulse: 80  BP Patient Position: Chair  BP: 136/80  Respirations: 16 PER MINUTE  O2 Device: None (Room air)  SpO2: 98 %  SpO2 Location: Finger, Second (Index)    Medical Concerns: Pt denies  having new medical concerns.   Pain: Pt  denies having pain.    Pt will continue to be monitored.

## 2021-04-29 NOTE — Progress Notes
Chart check completed.

## 2021-04-29 NOTE — Progress Notes
PSYCHIATRIC PROGRESS NOTE     LOS: 43 days  Voluntary/Involuntary Status: Voluntary  Guardian? Yes  Jamie [name], Jamie [name] (Mother)   [phone]   Jamie [name], Jamie [name] (Father)  [phone]     MEDICATIONS   Current Psychotropic Medications:   1. Invega Sustenna IM every 28 days, last 04/20/21  2. Depakote ER 2500 mg QHS     ASSESSMENT & DIAGNOSES   Jamie Langis a 25 y.o.?Caucasian?adult?with a history of schizoaffective disorder and polysubstance abuse?who presented to [facility] as a transfer from OSH?with manic symptoms in context of methamphetamine use. This appears to be precipitated by?methamphetamine use. Factors that seem to have predisposed?him?to mania?include schizoaffective disorder, substance abuse and uncle with schizophrenia. This current problem is maintained by?ongoing substance abuse, lack of consistent medication compliance, and poor insight. However, protective factors include?good support from parents who are guardians, motivation to seek treatment, and fair insight into current situation. Proposed treatment will consist of?substance detoxification and medication management.    DSM-5 DIAGNOSES:  1. Stimulant use disorder, methamphetamine type, moderate  2. Methamphetamine withdrawal, resolved  3. Schizoaffective disorder, bipolar type  4. Tobacco use disorder (chewing tobacco), severe  5. Stimulant use disorder, cocaine type, unspecified severity     PLAN     ? No indications for medication changes for 04/27/21.   ? Received Invega Sustenna 156 mg IM on 04/20/21  ? Continue Depakote ER PTA dose 2500 mg QHS for schizoaffective disorder and mood stabilization  ? Level 1 CARES assessment completed 6/8. Level 1 recommended Level 2 assessment.?Pending at this time.  ? SW interventions, reaching out to facilities for placement.    Discussed and seen with Dr. Sherrine Maples        Metabolic labs completed on:  Lipid Panel:   LDL   Date Value Ref Range Status   03/19/2021 106 (H) <100 mg/dL Final     HDL   Date Value Ref Range Status   03/19/2021 55 >40 MG/DL Final     VLDL   Date Value Ref Range Status   03/19/2021 15 MG/DL Final     Cholesterol   Date Value Ref Range Status   03/19/2021 174 <200 MG/DL Final     Triglycerides   Date Value Ref Range Status   03/19/2021 76 <150 MG/DL Final       Blood Glucose:  Hemoglobin A1C   Date Value Ref Range Status   03/19/2021 5.3 4.0 - 6.0 % Final     Comment:     The ADA recommends that most patients with type 1 and type 2 diabetes maintain   an A1c level <7%.       ______________________________________________________________     Jamie [name] was seen today for follow up. Reports sleep is good and denies fatigue. Appetite is good. Denies SI/HI/AVH. Tolerating medications well.      REVIEW OF SYSTEMS   Review of Systems   Constitutional: Negative for fatigue.   Respiratory: Negative for cough and shortness of breath.    Cardiovascular: Negative for chest pain.   Gastrointestinal: Negative for constipation, diarrhea, nausea and vomiting.   Neurological: Negative for headaches.   Psychiatric/Behavioral: Negative for hallucinations and suicidal ideas.          OBJECTIVE                    Vital Signs:  Current                Vital Signs:  24 Hour Range   BP: 136/80 (06/29 1900)  Temp: 37 ?C (98.6 ?F) (06/29 1900)  Pulse: 80 (06/29 1900)  Respirations: 16 PER MINUTE (06/29 1900)  SpO2: 98 % (06/29 1900) BP: (136)/(80)   Temp:  [37 ?C (98.6 ?F)]   Pulse:  [80]   Respirations:  [16 PER MINUTE]   SpO2:  [98 %]    Intensity Pain Scale (Self Report): (not recorded)      Sleep:  Hours of Sleep: 6.25   Quality of Sleep: Resting Quietly    Scheduled Medications:  divalproex (DEPAKOTE ER) ER tablet 2,500 mg, 2,500 mg, Oral, QHS  nicotine (NICODERM CQ STEP 1) 21 mg/day patch 1 patch, 1 patch, Transdermal, QDAY  paliperidone palmitate (INVEGA) (+) injection 156 mg, 156 mg, Intramuscular, Q28 Days (4 weeks)        PRN Medications:  acetaminophen Q6H PRN, calcium carbonate TID PRN, nicotine polacrilex Q2H PRN 4 mg at 04/28/21 1949, OLANZapine Q6H PRN **OR** OLANZapine Q6H PRN, polyethylene glycol 3350 QDAY PRN, traZODone QHS PRN 50 mg at 04/27/21 2124    Mental Status Exam:  ? General/Constitutional: 25 y.o. adult, appears stated age, dressed in?hospital?attire, fair hygiene   ? Behavior: calm, cooperative, pleasant  ? Speech/Motor: rrr with fair articulation  ? Eye Contact: good  ? Mood: okay  ? Affect: Content, mood congruent  ? Thought Process: linear and goal directed  ? Associations: Intact  ? Thought Content: Denies SI/HI. Shows no evidence of delusions or paranoia   ? Perception: Denies AVH.   Does not appear to be responding to internal stimuli   ? Insight/Judgment: fair/fair    Focused Physical Exam:  ? Musculoskeletal: able to arise from seated position without assistance  ? Neurological: no UE tremor  ? Pulm: Patient is respirating in no apparent distress, no cough  ? Gait: Ambulates independently without difficulty   ______________________________________________________________  [name], MD

## 2021-04-29 NOTE — Progress Notes
Pt in milieu during assessment.  Pt in milieu for am meal.  Pt denies SI/HI/AH/VH at this time.  Pt denies anxiety/depression/pain.  Pt flat affect, but cooperative.  Pt reports that he is waiting for placement.

## 2021-04-29 NOTE — Progress Notes
PRN Medication Administered:  Nicorette gum  Non pharmacological interventions attempted prior to PRN medication administration:  Pt walking the hallways    Brief narrative of reason for PRN medication administration: pt requested prn med for cravings.

## 2021-04-29 NOTE — Behavioral Health Treatment Team
TREATMENT TEAM NOTE  Name: Jamie [name]        MRN: [id number]          DOB: 22-May-1996            Age: 25 y.o.  Admission Date: 03/16/2021       LOS: 43 days    Date of Service: 04/29/2021        Attendees:  Nursing: Freddie Apley, RN, BSN  Therapy: Clement Husbands, LSCSW, [facility]  Therapy: Marijean Heath, LMLP, LCP  CM: Neysa Bonito, LPC  CM: Eden Lathe, LPC  CM: Berle Mull, Eliezer Bottom, MT-BC  Nurse Manager: Ursula Beath, RN  Pharmacy: Blinda Leatherwood, PharmD  Psychology: Assunta Gambles. Lowry Bowl, PhD  Psychiatry Resident: Karie Fetch, MD   Psychiatry: Dr. Sherrine Maples, DO      Significant Points Discussed: congruent affect, calm, cooperative, endorses AVH, denies SI/HI,     Treatment Plan Discussion: continue to explore placement

## 2021-04-29 NOTE — Care Plan
Problem: Health Maintenance - Impaired  Goal: Establish therapeutic relationships  Outcome: Goal Ongoing  Goal: Knowledge of health maintenance  Outcome: Goal Ongoing     Problem: Coping - Ineffective, Family  Goal: Effective Coping  Outcome: Goal Ongoing  Goal: Communicate with support staff  Outcome: Goal Ongoing  Goal: Knowledge of positive coping methods  Outcome: Goal Ongoing     Problem: Social Interaction - Impaired  Goal: Increased Social Interaction  Outcome: Goal Ongoing  Goal: Knowledge of social interaction  Outcome: Goal Ongoing     Problem: Discharge Planning  Goal: Participation in plan of care  Outcome: Goal Ongoing

## 2021-04-29 NOTE — Care Plan
Problem: Discharge Planning  Goal: Participation in plan of care  Outcome: Goal Ongoing

## 2021-04-30 NOTE — Progress Notes
[id number]  Patient was calm, cooperative and compliant. He denied SI/HI/AH/depression/anxiety/pain/paranoia. Patient endorsed VH and stated calmly "I just see the stuff I normally see." He shared that he slept alright last night. He has had appropriate interactions with staff and peers and is able to state his needs adequately.

## 2021-04-30 NOTE — Progress Notes
Chart check completed.

## 2021-04-30 NOTE — Behavioral Health Treatment Team
TREATMENT TEAM NOTE  Name: Jamie [name]        MRN: [id number]          DOB: 18-Aug-1996            Age: 25 y.o.  Admission Date: 03/16/2021       LOS: 44 days    Date of Service: 04/30/2021        Attendees:  UR: Ival Bible, LMSW  Nursing: Freddie Apley, RN, BSN  Therapy: Clement Husbands, [place], [facility]  Therapy: Marijean Heath, LMLP, LCP  CM: Neysa Bonito, LPC  CM: Eden Lathe, LPC  CM: Berle Mull, [facility], MT-BC  Activities Coordinator: Philippa Chester  Pharmacy: Blinda Leatherwood, PharmD  Psychiatry: Shella Maxim. Sherrine Maples, DO  Psychiatry: Frankey Poot, APRN-NP  Psychiatry Resident: Karie Fetch, MD   Psychiatry Resident: Milana Kidney, DO       Significant Points Discussed: CM working on placement options. Excited to start exercise program with activities coordinator.     Treatment Plan Discussion: No update

## 2021-04-30 NOTE — Care Plan
Problem: Health Maintenance - Impaired  Goal: Establish therapeutic relationships  Outcome: Goal Ongoing  Goal: Knowledge of health maintenance  Outcome: Goal Ongoing     Problem: Coping - Ineffective, Family  Goal: Effective Coping  Outcome: Goal Ongoing  Goal: Communicate with support staff  Outcome: Goal Ongoing  Goal: Knowledge of positive coping methods  Outcome: Goal Ongoing     Problem: Social Interaction - Impaired  Goal: Increased Social Interaction  Outcome: Goal Ongoing  Goal: Knowledge of social interaction  Outcome: Goal Ongoing     Problem: Discharge Planning  Goal: Participation in plan of care  Outcome: Goal Ongoing

## 2021-04-30 NOTE — Progress Notes
PSYCHIATRIC PROGRESS NOTE     LOS: 44 days  Voluntary/Involuntary Status: Voluntary  Guardian? Yes  Jamie [name], Jamie [name] (Mother)   [phone]   Jamie [name], Jamie [name] (Father)  [phone]     MEDICATIONS   Current Psychotropic Medications:   1. Invega Sustenna IM every 28 days, last 04/20/21  2. Depakote ER 2500 mg QHS     ASSESSMENT & DIAGNOSES   Jamie [name] a 25 y.o.?Caucasian?adult?with a history of schizoaffective disorder and polysubstance abuse?who presented to [facility] as a transfer from OSH?with manic symptoms in context of methamphetamine use. This appears to be precipitated by?methamphetamine use. Factors that seem to have predisposed?him?to mania?include schizoaffective disorder, substance abuse and uncle with schizophrenia. This current problem is maintained by?ongoing substance abuse, lack of consistent medication compliance, and poor insight. However, protective factors include?good support from parents who are guardians, motivation to seek treatment, and fair insight into current situation. Proposed treatment will consist of?substance detoxification and medication management.    DSM-5 DIAGNOSES:  1. Stimulant use disorder, methamphetamine type, moderate  2. Methamphetamine withdrawal, resolved  3. Schizoaffective disorder, bipolar type  4. Tobacco use disorder (chewing tobacco), severe  5. Stimulant use disorder, cocaine type, unspecified severity     PLAN     ? No indications for medication changes for 7/1.   ? Received Invega Sustenna 156 mg IM on 04/20/21  ? Continue Depakote ER PTA dose 2500 mg QHS for schizoaffective disorder and mood stabilization  ? Level 1 CARES assessment completed 6/8. Level 1 recommended Level 2 assessment.?Pending at this time.  ? SW interventions, reaching out to facilities for placement.    Discussed and seen with Dr. Sherrine Maples        Metabolic labs completed on:  Lipid Panel:   LDL   Date Value Ref Range Status   03/19/2021 106 (H) <100 mg/dL Final     HDL   Date Value Ref Range Status   03/19/2021 55 >40 MG/DL Final     VLDL   Date Value Ref Range Status   03/19/2021 15 MG/DL Final     Cholesterol   Date Value Ref Range Status   03/19/2021 174 <200 MG/DL Final     Triglycerides   Date Value Ref Range Status   03/19/2021 76 <150 MG/DL Final       Blood Glucose:  Hemoglobin A1C   Date Value Ref Range Status   03/19/2021 5.3 4.0 - 6.0 % Final     Comment:     The ADA recommends that most patients with type 1 and type 2 diabetes maintain   an A1c level <7%.       ______________________________________________________________     Jamie [name] was seen today for follow up. Reports that he slept well, reports good appetite. Mood is alright this morning. Denies SI/HI/AVH. Denies any concerns at this time.      REVIEW OF SYSTEMS   Review of Systems   Respiratory: Negative for cough and shortness of breath.    Cardiovascular: Negative for chest pain.   Gastrointestinal: Negative for constipation, diarrhea, nausea and vomiting.   Genitourinary: Negative for difficulty urinating.   Musculoskeletal: Negative for arthralgias.   Skin: Negative for color change.   Neurological: Negative for headaches.   Psychiatric/Behavioral: Negative for hallucinations and suicidal ideas. The patient is not nervous/anxious.           OBJECTIVE  Vital Signs:  Current                Vital Signs: 24 Hour Range   BP: 91/54 (07/01 0800)  Temp: 36.9 ?C (98.5 ?F) (07/01 0800)  Pulse: 63 (07/01 0800)  Respirations: 15 PER MINUTE (07/01 0800)  SpO2: 99 % (07/01 0800)  O2 Device: None (Room air) (06/30 1919) BP: (91-101)/(52-54)   Temp:  [36.9 ?C (98.4 ?F)-36.9 ?C (98.5 ?F)]   Pulse:  [63-82]   Respirations:  [15 PER MINUTE-18 PER MINUTE]   SpO2:  [98 %-99 %]   O2 Device: None (Room air)   Intensity Pain Scale (Self Report): (not recorded)      Sleep:  Hours of Sleep: 6.25   Quality of Sleep: Resting Quietly    Scheduled Medications:  divalproex (DEPAKOTE ER) ER tablet 2,500 mg, 2,500 mg, Oral, QHS  nicotine (NICODERM CQ STEP 1) 21 mg/day patch 1 patch, 1 patch, Transdermal, QDAY  paliperidone palmitate (INVEGA) (+) injection 156 mg, 156 mg, Intramuscular, Q28 Days (4 weeks)        PRN Medications:  acetaminophen Q6H PRN, calcium carbonate TID PRN, nicotine polacrilex Q2H PRN 4 mg at 04/29/21 2035, OLANZapine Q6H PRN **OR** OLANZapine Q6H PRN, polyethylene glycol 3350 QDAY PRN, traZODone QHS PRN 50 mg at 04/29/21 2129    Mental Status Exam:  ? General/Constitutional: 26 y.o. adult, appears stated age, dressed in?hospital?attire, fair hygiene   ? Behavior: calm, cooperative, pleasant  ? Speech/Motor: rrr with good articulation  ? Eye Contact: good  ? Mood: alright  ? Affect: full, mood congruent  ? Thought Process: linear and goal directed  ? Associations: Intact  ? Thought Content: Denies SI/HI. Shows no evidence of delusions or paranoia   ? Perception: Denies AVH.   Does not appear to be responding to internal stimuli   ? Insight/Judgment: fair/fair    Focused Physical Exam:  ? Musculoskeletal: able to arise from seated position without assistance  ? Neurological: no UE tremor  ? Pulm: Patient is respirating in no apparent distress, no cough  ? Gait: Ambulates independently without difficulty   ______________________________________________________________  Jamie Yoshiko Keleher, MD

## 2021-05-01 NOTE — Progress Notes
Pt was seen in the milieu interacting with peers, playing cards and watching TV throughout the evening. Pt denied depression, anxiety, SI, HI, AH and pain. Pt reported VH that he described as "floaters" that he see's everyday. Pt compliant with prescribed HS medications. Pt will continue to be monitored per protocol. Pt encouraged to come to staff with questions or concerns.    21:35  PRN Medication Administered: Trazodone 50 mg    Non pharmacological interventions attempted prior to PRN medication administration: Decrease stimuli     Brief narrative of reason for PRN medication administration: Pt requested and given trazodone for sleep.

## 2021-05-01 NOTE — Progress Notes
Chart check completed.

## 2021-05-01 NOTE — Progress Notes
Chart check completed

## 2021-05-01 NOTE — Care Plan
Patient seen eating breakfast this morning and then went back to bed to sleep. Patient is pleasant and cooperative with care. Meeting with treatment team daily. Compliant with medication. Will continue to monitor.   Problem: Health Maintenance - Impaired  Goal: Establish therapeutic relationships  Outcome: Goal Ongoing  Goal: Knowledge of health maintenance  Outcome: Goal Ongoing     Problem: Coping - Ineffective, Family  Goal: Effective Coping  Outcome: Goal Ongoing  Goal: Communicate with support staff  Outcome: Goal Ongoing  Goal: Knowledge of positive coping methods  Outcome: Goal Ongoing     Problem: Social Interaction - Impaired  Goal: Increased Social Interaction  Outcome: Goal Ongoing  Goal: Knowledge of social interaction  Outcome: Goal Ongoing     Problem: Discharge Planning  Goal: Participation in plan of care  Outcome: Goal Ongoing

## 2021-05-01 NOTE — Care Plan
Problem: Health Maintenance - Impaired  Goal: Establish therapeutic relationships  Outcome: Goal Ongoing  Goal: Knowledge of health maintenance  Outcome: Goal Ongoing     Problem: Coping - Ineffective, Family  Goal: Effective Coping  Outcome: Goal Ongoing  Goal: Communicate with support staff  Outcome: Goal Ongoing  Goal: Knowledge of positive coping methods  Outcome: Goal Ongoing     Problem: Social Interaction - Impaired  Goal: Increased Social Interaction  Outcome: Goal Ongoing  Goal: Knowledge of social interaction  Outcome: Goal Ongoing     Problem: Discharge Planning  Goal: Participation in plan of care  Outcome: Goal Ongoing

## 2021-05-01 NOTE — Progress Notes
PSYCHIATRIC PROGRESS NOTE     LOS: 45 days  Voluntary/Involuntary Status: Voluntary  Guardian? Yes  Quin, Aborn (Mother)   [phone]   Kyu, Caruth (Father)  [phone]     MEDICATIONS   Current Psychotropic Medications:   1. Invega Sustenna IM every 28 days, last 04/20/21  2. Depakote ER 2500 mg QHS     ASSESSMENT & DIAGNOSES   Jamie [name]?is a 25 y.o.?Caucasian?adult?with a history of schizoaffective disorder and polysubstance abuse?who presented to [facility] as a transfer from OSH?with manic symptoms in context of methamphetamine use. This appears to be precipitated by?methamphetamine use. Factors that seem to have predisposed?him?to mania?include schizoaffective disorder, substance abuse and uncle with schizophrenia. This current problem is maintained by?ongoing substance abuse, lack of consistent medication compliance, and poor insight. However, protective factors include?good support from parents who are guardians, motivation to seek treatment, and fair insight into current situation. Proposed treatment will consist of?substance detoxification and medication management.    DSM-5 DIAGNOSES:  1. Stimulant use disorder, methamphetamine type, moderate  2. Methamphetamine withdrawal, resolved  3. Schizoaffective disorder, bipolar type  4. Tobacco use disorder (chewing tobacco), severe  5. Stimulant use disorder, cocaine type, unspecified severity     PLAN     ? No indications for medication changes for 7/2.   ? Received Invega Sustenna 156 mg IM on 04/20/21  ? Continue Depakote ER PTA dose 2500 mg QHS for schizoaffective disorder and mood stabilization  ? Level 1 CARES assessment completed 6/8. Level 1 recommended Level 2 assessment.?Pending at this time.  ? SW interventions, reaching out to facilities for placement.    Discussed and seen with Dr. Vernon Prey.        Metabolic labs completed on:  Lipid Panel:   LDL   Date Value Ref Range Status   03/19/2021 106 (H) <100 mg/dL Final     HDL   Date Value Ref Range Status   03/19/2021 55 >40 MG/DL Final     VLDL   Date Value Ref Range Status   03/19/2021 15 MG/DL Final     Cholesterol   Date Value Ref Range Status   03/19/2021 174 <200 MG/DL Final     Triglycerides   Date Value Ref Range Status   03/19/2021 76 <150 MG/DL Final       Blood Glucose:  Hemoglobin A1C   Date Value Ref Range Status   03/19/2021 5.3 4.0 - 6.0 % Final     Comment:     The ADA recommends that most patients with type 1 and type 2 diabetes maintain   an A1c level <7%.       ______________________________________________________________     Jamie [name] was seen today for follow up. Reports that he slept well, reports good appetite. Mood is pretty good this morning. Denies SI/HI/AVH.      REVIEW OF SYSTEMS   Review of Systems   Respiratory: Negative for cough and shortness of breath.    Cardiovascular: Negative for chest pain.   Gastrointestinal: Negative for constipation, diarrhea, nausea and vomiting.   Genitourinary: Negative for difficulty urinating.   Musculoskeletal: Negative for arthralgias.   Skin: Negative for color change.   Neurological: Negative for headaches.   Psychiatric/Behavioral: Negative for hallucinations and suicidal ideas. The patient is not nervous/anxious.           OBJECTIVE  Vital Signs:  Current                Vital Signs: 24 Hour Range   BP: 112/73 (07/01 1935)  Temp: 37.1 ?C (98.8 ?F) (07/01 1935)  Pulse: 83 (07/01 1935)  Respirations: 20 PER MINUTE (07/01 1935)  SpO2: 98 % (07/01 1935) BP: (91-112)/(54-73)   Temp:  [36.9 ?C (98.5 ?F)-37.1 ?C (98.8 ?F)]   Pulse:  [63-83]   Respirations:  [15 PER MINUTE-20 PER MINUTE]   SpO2:  [98 %-99 %]    Intensity Pain Scale (Self Report): (not recorded)      Sleep:  Hours of Sleep: 5.75   Quality of Sleep: Resting Quietly    Scheduled Medications:  divalproex (DEPAKOTE ER) ER tablet 2,500 mg, 2,500 mg, Oral, QHS  nicotine (NICODERM CQ STEP 1) 21 mg/day patch 1 patch, 1 patch, Transdermal, QDAY  paliperidone palmitate (INVEGA) (+) injection 156 mg, 156 mg, Intramuscular, Q28 Days (4 weeks)        PRN Medications:  acetaminophen Q6H PRN, calcium carbonate TID PRN, nicotine polacrilex Q2H PRN 4 mg at 04/30/21 1827, OLANZapine Q6H PRN **OR** OLANZapine Q6H PRN, polyethylene glycol 3350 QDAY PRN, traZODone QHS PRN 50 mg at 04/30/21 2135    Mental Status Exam:  ? General/Constitutional: 25 y.o. adult, appears stated age, dressed in?hospital?attire, fair hygiene   ? Behavior: calm, cooperative, pleasant  ? Speech/Motor: rrr with good articulation  ? Eye Contact: good  ? Mood: pretty good  ? Affect: full, mood congruent  ? Thought Process: linear and goal directed  ? Associations: Intact  ? Thought Content: Denies SI/HI. Shows no evidence of delusions or paranoia   ? Perception: Denies AVH. Continues to report floaters. Does not appear to be responding to internal stimuli   ? Insight/Judgment: fair/fair    Focused Physical Exam:  ? Musculoskeletal: able to arise from seated position without assistance  ? Neurological: no UE tremor  ? Pulm: Patient is respirating in no apparent distress, no cough  ? Gait: Ambulates independently without difficulty   ______________________________________________________________  Jamie Duston Smolenski, MD

## 2021-05-02 NOTE — Progress Notes
Chart check completed.

## 2021-05-02 NOTE — Progress Notes
PSYCHIATRIC PROGRESS NOTE     LOS: 46 days  Voluntary/Involuntary Status: Voluntary  Guardian? Yes  Nasser, [place] (Mother)   [phone]   Jerred, [place] (Father)  [phone]     MEDICATIONS   Current Psychotropic Medications:   1. Invega Sustenna IM every 28 days, last 04/20/21  2. Depakote ER 2500 mg QHS     ASSESSMENT & DIAGNOSES   Jamie [name]?is a 25 y.o.?Caucasian?adult?with a history of schizoaffective disorder and polysubstance abuse?who presented to [facility] as a transfer from OSH?with manic symptoms in context of methamphetamine use. This appears to be precipitated by?methamphetamine use. Factors that seem to have predisposed?him?to mania?include schizoaffective disorder, substance abuse and uncle with schizophrenia. This current problem is maintained by?ongoing substance abuse, lack of consistent medication compliance, and poor insight. However, protective factors include?good support from parents who are guardians, motivation to seek treatment, and fair insight into current situation. Proposed treatment will consist of?substance detoxification and medication management.    DSM-5 DIAGNOSES:  1. Stimulant use disorder, methamphetamine type, moderate  2. Methamphetamine withdrawal, resolved  3. Schizoaffective disorder, bipolar type  4. Tobacco use disorder (chewing tobacco), severe  5. Stimulant use disorder, cocaine type, unspecified severity     PLAN     ? No indications for medication changes for 7/3.   ? Received Invega Sustenna 156 mg IM on 04/20/21  ? Continue Depakote ER PTA dose 2500 mg QHS for schizoaffective disorder and mood stabilization  ? Level 1 CARES assessment completed 6/8. Level 1 recommended Level 2 assessment.?Pending at this time.  ? SW interventions, reaching out to facilities for placement.    Discussed and seen with Dr. Vernon Prey.        Metabolic labs completed on:  Lipid Panel:   LDL   Date Value Ref Range Status   03/19/2021 106 (H) <100 mg/dL Final     HDL   Date Value Ref Range Status   03/19/2021 55 >40 MG/DL Final     VLDL   Date Value Ref Range Status   03/19/2021 15 MG/DL Final     Cholesterol   Date Value Ref Range Status   03/19/2021 174 <200 MG/DL Final     Triglycerides   Date Value Ref Range Status   03/19/2021 76 <150 MG/DL Final       Blood Glucose:  Hemoglobin A1C   Date Value Ref Range Status   03/19/2021 5.3 4.0 - 6.0 % Final     Comment:     The ADA recommends that most patients with type 1 and type 2 diabetes maintain   an A1c level <7%.       ______________________________________________________________     Jamie [name] was seen today for follow up. Reports that he slept well, reports good appetite. Mood is good this morning. Denies SI/HI/AVH. Reports he has been working on an individual exercise routine with a staff member, pleased with how it is going.     REVIEW OF SYSTEMS   Review of Systems   Respiratory: Negative for cough and shortness of breath.    Cardiovascular: Negative for chest pain.   Gastrointestinal: Negative for constipation, diarrhea, nausea and vomiting.   Genitourinary: Negative for difficulty urinating.   Musculoskeletal: Negative for arthralgias.   Skin: Negative for color change.   Neurological: Negative for headaches.   Psychiatric/Behavioral: Negative for hallucinations and suicidal ideas. The patient is not nervous/anxious.           OBJECTIVE  Vital Signs:  Current                Vital Signs: 24 Hour Range   BP: 111/68 (07/03 1100)  Temp: 36.5 ?C (97.7 ?F) (07/03 1100)  Pulse: 53 (07/03 1100)  Respirations: 16 PER MINUTE (07/03 1100)  SpO2: 96 % (07/03 1100) BP: (111-128)/(65-68)   Temp:  [36.5 ?C (97.7 ?F)-36.9 ?C (98.4 ?F)]   Pulse:  [53-72]   Respirations:  [16 PER MINUTE-18 PER MINUTE]   SpO2:  [96 %-97 %]    Intensity Pain Scale (Self Report): (not recorded)      Sleep:  Hours of Sleep: 5.75   Quality of Sleep: Resting Quietly    Scheduled Medications:  divalproex (DEPAKOTE ER) ER tablet 2,500 mg, 2,500 mg, Oral, QHS  nicotine (NICODERM CQ STEP 1) 21 mg/day patch 1 patch, 1 patch, Transdermal, QDAY  paliperidone palmitate (INVEGA) (+) injection 156 mg, 156 mg, Intramuscular, Q28 Days (4 weeks)        PRN Medications:  acetaminophen Q6H PRN, calcium carbonate TID PRN, nicotine polacrilex Q2H PRN 4 mg at 05/02/21 1624, OLANZapine Q6H PRN **OR** OLANZapine Q6H PRN, polyethylene glycol 3350 QDAY PRN, traZODone QHS PRN 50 mg at 05/01/21 2127    Mental Status Exam:  ? General/Constitutional: 25 y.o. adult, appears stated age, dressed in?hospital?attire, fair hygiene   ? Behavior: calm, cooperative, pleasant  ? Speech/Motor: rrr with good articulation  ? Eye Contact: good  ? Mood: good  ? Affect: full, mood congruent  ? Thought Process: linear and goal directed  ? Associations: Intact  ? Thought Content: Denies SI/HI. Shows no evidence of delusions or paranoia   ? Perception: Denies AVH. Continues to report floaters. Does not appear to be responding to internal stimuli   ? Insight/Judgment: fair/fair    Focused Physical Exam:  ? Musculoskeletal: able to arise from seated position without assistance  ? Neurological: no UE tremor  ? Pulm: Patient is respirating in no apparent distress, no cough  ? Gait: Ambulates independently without difficulty   ______________________________________________________________  Jamie Sharlette Jansma, MD

## 2021-05-02 NOTE — Care Plan
Problem: Health Maintenance - Impaired  Goal: Knowledge of health maintenance  Outcome: Goal Ongoing     Problem: Coping - Ineffective, Family  Goal: Communicate with support staff  Outcome: Goal Ongoing     Problem: Coping - Ineffective, Family  Goal: Knowledge of positive coping methods  Outcome: Goal Ongoing

## 2021-05-02 NOTE — Care Plan
Pt has been calm and cooperative. Denies all psych. No behavioral changes....      Problem: Health Maintenance - Impaired  Goal: Establish therapeutic relationships  Outcome: Goal Ongoing  Goal: Knowledge of health maintenance  Outcome: Goal Ongoing     Problem: Coping - Ineffective, Family  Goal: Effective Coping  Outcome: Goal Ongoing  Goal: Communicate with support staff  Outcome: Goal Ongoing  Goal: Knowledge of positive coping methods  Outcome: Goal Ongoing     Problem: Social Interaction - Impaired  Goal: Increased Social Interaction  Outcome: Goal Ongoing  Goal: Knowledge of social interaction  Outcome: Goal Ongoing     Problem: Discharge Planning  Goal: Participation in plan of care  Outcome: Goal Ongoing

## 2021-05-03 NOTE — Progress Notes
Patient is calm, cooperative, and medication compliant. Interacts with peers and staff appropriately.

## 2021-05-03 NOTE — Progress Notes
PSYCHIATRIC PROGRESS NOTE     LOS: 47 days  Voluntary/Involuntary Status: Voluntary  Guardian? Yes  Saddiq, Ehrhart (Mother)   [phone]   Marcelous, Seman (Father)  [phone]     MEDICATIONS   Current Psychotropic Medications:   1. Invega Sustenna IM every 28 days, last 04/20/21  2. Depakote ER 2500 mg QHS     ASSESSMENT & DIAGNOSES   Jamie [name]?is a 25 y.o.?Caucasian?adult?with a history of schizoaffective disorder and polysubstance abuse?who presented to [facility] as a transfer from OSH?with manic symptoms in context of methamphetamine use. This appears to be precipitated by?methamphetamine use. Factors that seem to have predisposed?him?to mania?include schizoaffective disorder, substance abuse and uncle with schizophrenia. This current problem is maintained by?ongoing substance abuse, lack of consistent medication compliance, and poor insight. However, protective factors include?good support from parents who are guardians, motivation to seek treatment, and fair insight into current situation. Proposed treatment will consist of?substance detoxification and medication management.    DSM-5 DIAGNOSES:  1. Stimulant use disorder, methamphetamine type, moderate  2. Methamphetamine withdrawal, resolved  3. Schizoaffective disorder, bipolar type  4. Tobacco use disorder (chewing tobacco), severe  5. Stimulant use disorder, cocaine type, unspecified severity     PLAN     ? No indications for medication changes for 7/3.   ? Received Invega Sustenna 156 mg IM on 04/20/21  ? Continue Depakote ER PTA dose 2500 mg QHS for schizoaffective disorder and mood stabilization  ? Level 1 CARES assessment completed 6/8. Level 1 recommended Level 2 assessment.?Pending at this time.  ? SW interventions, reaching out to facilities for placement.    Discussed and seen with Dr. Vernon Prey.        Metabolic labs completed on:  Lipid Panel:   LDL   Date Value Ref Range Status   03/19/2021 106 (H) <100 mg/dL Final     HDL   Date Value Ref Range Status   03/19/2021 55 >40 MG/DL Final     VLDL   Date Value Ref Range Status   03/19/2021 15 MG/DL Final     Cholesterol   Date Value Ref Range Status   03/19/2021 174 <200 MG/DL Final     Triglycerides   Date Value Ref Range Status   03/19/2021 76 <150 MG/DL Final       Blood Glucose:  Hemoglobin A1C   Date Value Ref Range Status   03/19/2021 5.3 4.0 - 6.0 % Final     Comment:     The ADA recommends that most patients with type 1 and type 2 diabetes maintain   an A1c level <7%.       ______________________________________________________________     Jamie [name] was seen today for follow up. Reports that he continues to do well, with good mood, sleep, and appetite. Denies any drug cravings. No SI, HI, AVH.     REVIEW OF SYSTEMS   Review of Systems   Respiratory: Negative for cough and shortness of breath.    Cardiovascular: Negative for chest pain.   Gastrointestinal: Negative for constipation, diarrhea, nausea and vomiting.   Genitourinary: Negative for difficulty urinating.   Musculoskeletal: Negative for arthralgias.   Skin: Negative for color change.   Neurological: Negative for headaches.   Psychiatric/Behavioral: Negative for hallucinations and suicidal ideas. The patient is not nervous/anxious.           OBJECTIVE  Vital Signs:  Current                Vital Signs: 24 Hour Range   BP: 103/55 (07/04 0700)  Temp: 36.2 ?C (97.1 ?F) (07/04 0700)  Pulse: 80 (07/04 0700)  Respirations: 16 PER MINUTE (07/04 0700)  SpO2: 96 % (07/04 0700) BP: (103-115)/(55-63)   Temp:  [36.2 ?C (97.1 ?F)-37.1 ?C (98.8 ?F)]   Pulse:  [70-80]   Respirations:  [16 PER MINUTE-18 PER MINUTE]   SpO2:  [96 %-97 %]    Intensity Pain Scale (Self Report): (not recorded)      Sleep:  Hours of Sleep: 5   Quality of Sleep: Resting Quietly    Scheduled Medications:  divalproex (DEPAKOTE ER) ER tablet 2,500 mg, 2,500 mg, Oral, QHS  nicotine (NICODERM CQ STEP 1) 21 mg/day patch 1 patch, 1 patch, Transdermal, QDAY  paliperidone palmitate (INVEGA) (+) injection 156 mg, 156 mg, Intramuscular, Q28 Days (4 weeks)        PRN Medications:  acetaminophen Q6H PRN, calcium carbonate TID PRN, nicotine polacrilex Q2H PRN 4 mg at 05/02/21 2022, OLANZapine Q6H PRN **OR** OLANZapine Q6H PRN, polyethylene glycol 3350 QDAY PRN, traZODone QHS PRN 50 mg at 05/02/21 2105    Mental Status Exam:  ? General/Constitutional: 25 y.o. adult, appears stated age, dressed in?hospital?attire, fair hygiene   ? Behavior: calm, cooperative, pleasant  ? Speech/Motor: rrr with good articulation  ? Eye Contact: good  ? Mood: good  ? Affect: full, mood congruent  ? Thought Process: linear and goal directed  ? Associations: Intact  ? Thought Content: Denies SI/HI. Shows no evidence of delusions or paranoia   ? Perception: Denies AVH. Continues to report floaters. Does not appear to be responding to internal stimuli   ? Insight/Judgment: fair/fair    Focused Physical Exam:  ? Musculoskeletal: able to arise from seated position without assistance  ? Neurological: no UE tremor  ? Pulm: Patient is respirating in no apparent distress, no cough  ? Gait: Ambulates independently without difficulty   ______________________________________________________________  Audrie Lia, MD

## 2021-05-03 NOTE — Progress Notes
Chart check complete

## 2021-05-03 NOTE — Care Plan
Problem: Health Maintenance - Impaired  Goal: Establish therapeutic relationships  Outcome: Goal Ongoing  Goal: Knowledge of health maintenance  Outcome: Goal Ongoing     Problem: Coping - Ineffective, Family  Goal: Effective Coping  Outcome: Goal Ongoing  Goal: Communicate with support staff  Outcome: Goal Ongoing  Goal: Knowledge of positive coping methods  Outcome: Goal Ongoing     Problem: Social Interaction - Impaired  Goal: Increased Social Interaction  Outcome: Goal Ongoing  Goal: Knowledge of social interaction  Outcome: Goal Ongoing     Problem: Discharge Planning  Goal: Participation in plan of care  Outcome: Goal Ongoing

## 2021-05-04 NOTE — Progress Notes
[facility]  Therapy Progress Note    NAME:Jamie [name] MRN: [id number] DOB:1995-12-04 AGE: 25 y.o.  ADMISSION DATE: 03/16/2021 DAYS ADMITTED: LOS: 48 days    Date of Service:  05/04/21    Active Problems:    Schizoaffective disorder, bipolar type (HCC)    Tobacco use disorder, severe, dependence    Withdrawal from methamphetamine [facility])    Methamphetamine use disorder, moderate (HCC)    Objective:  Therapist joined Patient as he was walking up/down the hallway for exercise.  Eye contact WNL.  Affect full; mood congruent.    Narrative:  Patient asked questions related to placement/discharge.  He expressed understanding that the process takes time.  Some of the relevant factors regarding placement were discussed.  Patient expressed appreciation for the opportunity to work with [facility] and utilize the exercise room.  He denied any significant issues/concerns at this time.      Follow up:  Treatment Team will continue to follow.

## 2021-05-04 NOTE — Progress Notes
Chart check completed.

## 2021-05-04 NOTE — Progress Notes
PSYCHIATRIC PROGRESS NOTE     LOS: 48 days  Voluntary/Involuntary Status: Voluntary  Guardian? Yes  David, Rodriquez (Mother)   [phone]   Mohammad, Granade (Father)  [phone]     MEDICATIONS   Current Psychotropic Medications:   1. Invega Sustenna IM every 28 days, last 04/20/21  2. Depakote ER 2500 mg QHS     ASSESSMENT & DIAGNOSES   Jamie [name]?is a 25 y.o.?Caucasian?adult?with a history of schizoaffective disorder and polysubstance abuse?who presented to [facility] as a transfer from OSH?with manic symptoms in context of methamphetamine use. This appears to be precipitated by?methamphetamine use. Factors that seem to have predisposed?him?to mania?include schizoaffective disorder, substance abuse and uncle with schizophrenia. This current problem is maintained by?ongoing substance abuse, lack of consistent medication compliance, and poor insight. However, protective factors include?good support from parents who are guardians, motivation to seek treatment, and fair insight into current situation. Proposed treatment will consist of?substance detoxification and medication management.    DSM-5 DIAGNOSES:  1. Stimulant use disorder, methamphetamine type, moderate  2. Methamphetamine withdrawal, resolved  3. Schizoaffective disorder, bipolar type  4. Tobacco use disorder (chewing tobacco), severe  5. Stimulant use disorder, cocaine type, unspecified severity     PLAN     ? No indications for medication changes for 7/5.   ? Received Invega Sustenna 156 mg IM on 04/20/21  ? Continue Depakote ER PTA dose 2500 mg QHS for schizoaffective disorder and mood stabilization  ? Level 1 CARES assessment completed 6/8. Level 1 recommended Level 2 assessment.?Pending at this time.  ? SW interventions, reaching out to facilities for placement.    Discussed and seen with Dr. Sherrine Maples.        Metabolic labs completed on:  Lipid Panel:   LDL   Date Value Ref Range Status   03/19/2021 106 (H) <100 mg/dL Final     HDL   Date Value Ref Range Status   03/19/2021 55 >40 MG/DL Final     VLDL   Date Value Ref Range Status   03/19/2021 15 MG/DL Final     Cholesterol   Date Value Ref Range Status   03/19/2021 174 <200 MG/DL Final     Triglycerides   Date Value Ref Range Status   03/19/2021 76 <150 MG/DL Final       Blood Glucose:  Hemoglobin A1C   Date Value Ref Range Status   03/19/2021 5.3 4.0 - 6.0 % Final     Comment:     The ADA recommends that most patients with type 1 and type 2 diabetes maintain   an A1c level <7%.       ______________________________________________________________     Jamie [name] was seen today for follow up. Mood today is good, denies SI/HI/AVH. Good appetite/sleep.      REVIEW OF SYSTEMS   Review of Systems   Respiratory: Negative for cough and shortness of breath.    Cardiovascular: Negative for chest pain.   Gastrointestinal: Negative for constipation, diarrhea, nausea and vomiting.   Genitourinary: Negative for difficulty urinating.   Musculoskeletal: Negative for arthralgias.   Skin: Negative for color change.   Neurological: Negative for headaches.   Psychiatric/Behavioral: Negative for hallucinations and suicidal ideas. The patient is not nervous/anxious.           OBJECTIVE                    Vital Signs:  Current  Vital Signs: 24 Hour Range   BP: 119/65 (07/05 0700)  Temp: 36.4 ?C (97.5 ?F) (07/05 0700)  Pulse: 55 (07/05 0700)  Respirations: 18 PER MINUTE (07/05 0700)  SpO2: 96 % (07/05 0700) BP: (114-119)/(60-65)   Temp:  [36.4 ?C (97.5 ?F)-36.8 ?C (98.2 ?F)]   Pulse:  [55-86]   Respirations:  [16 PER MINUTE-18 PER MINUTE]   SpO2:  [96 %]    Intensity Pain Scale (Self Report): (not recorded)      Sleep:  Hours of Sleep: 6.5   Quality of Sleep: Resting Quietly    Scheduled Medications:  divalproex (DEPAKOTE ER) ER tablet 2,500 mg, 2,500 mg, Oral, QHS  nicotine (NICODERM CQ STEP 1) 21 mg/day patch 1 patch, 1 patch, Transdermal, QDAY  paliperidone palmitate (INVEGA) (+) injection 156 mg, 156 mg, Intramuscular, Q28 Days (4 weeks)        PRN Medications:  acetaminophen Q6H PRN, calcium carbonate TID PRN, nicotine polacrilex Q2H PRN 4 mg at 05/03/21 2013, OLANZapine Q6H PRN **OR** OLANZapine Q6H PRN, polyethylene glycol 3350 QDAY PRN, traZODone QHS PRN 50 mg at 05/03/21 2148    Mental Status Exam:  ? General/Constitutional: 25 y.o. adult, appears stated age, dressed in?hospital?attire, fair hygiene   ? Behavior: calm, cooperative, pleasant  ? Speech/Motor: rrr with good articulation  ? Eye Contact: good  ? Mood: good  ? Affect: full, mood congruent  ? Thought Process: linear and goal directed  ? Associations: Intact  ? Thought Content: Denies SI/HI. Shows no evidence of delusions or paranoia   ? Perception: Denies AVH. Continues to report floaters. Does not appear to be responding to internal stimuli   ? Insight/Judgment: fair/fair    Focused Physical Exam:  ? Musculoskeletal: able to arise from seated position without assistance  ? Neurological: no UE tremor  ? Pulm: Patient is respirating in no apparent distress, no cough  ? Gait: Ambulates independently without difficulty   ______________________________________________________________  Jamie Miela Desjardin, MD

## 2021-05-04 NOTE — Behavioral Health Treatment Team
TREATMENT TEAM NOTE  Name: Jamie [name]        MRN: [id number]          DOB: [birth year]            Age: 25 y.o.  Admission Date: 03/16/2021       LOS: 48 days    Date of Service: 05/04/2021        Attendees:    Therapy: Clement Husbands, Danie Binder, [facility]  Therapy: Marijean Heath, LMLP, LCP  CM: Berle Mull, [facility], MT-BC  Rec Therapy: Clare Charon, CTRS  Pharmacy: Darrin Luis, PharmD  Psychology: Assunta Gambles. Lowry Bowl, PhD  Psychiatry: Shella Maxim. Sherrine Maples, DO      Significant Points Discussed: Waiting for placement, attended a few groups, present in the milieu, interacts with peers. Seems to enjoy doing exercises one on one with staff.     Treatment Plan Discussion: Continue treatment

## 2021-05-04 NOTE — Care Plan
Problem: Health Maintenance - Impaired  Goal: Knowledge of health maintenance  Outcome: Goal Ongoing     Problem: Coping - Ineffective, Family  Goal: Effective Coping  Outcome: Goal Ongoing     Problem: Coping - Ineffective, Family  Goal: Communicate with support staff  Outcome: Goal Ongoing     Problem: Coping - Ineffective, Family  Goal: Knowledge of positive coping methods  Outcome: Goal Ongoing

## 2021-05-04 NOTE — Care Plan
Patient is compliant with his morning medications and vitals. Pt is tolerating meals well, appetite is stable. Pt returned to room after breakfast. Pt's mood and behavior are stable at this time. Pt denies anxiety,depression, pain, SI/HI/AVH at this time. Pt is pleasant, cooperative and follows command during assessment, pt was seen in and out the milieu, interacting well with peers and staffs in appropriate manner. No harm to self or others on unit and no unsafe behavior has been seen from patient. Patient is able to ask to have needs met appropriately and patient is encouraged to come to staff with concerns. Pt remains on 15 minute checks for safety. Pt voices understanding of plan of care and need for continued treatment. Will continue monitoring pt's safety and behavior.

## 2021-05-05 NOTE — Care Plan
Patient is compliant with his morning medications and vitals. Pt is tolerating meals well, appetite is stable.Pt's mood and behavior are stable at this time. Pt denies anxiety, depression, SI/HI/AVH at this time. Pt was seen in and out the milieu, he returned to his room after breakfast to rest, night RN reports pt declined PRN Trazadone yesterday night due to drowsiness he felt during the day time. No behavioral issues noted. Patient is able to ask to have needs met appropriately and patient is encouraged to come to staff with concerns. Pt remains on 15 minute checks for safety. Pt voices understanding of plan of care and need for continued treatment. Will continue monitoring pt's safety and behavior.

## 2021-05-05 NOTE — Progress Notes
PSYCHIATRIC PROGRESS NOTE     LOS: 49 days  Voluntary/Involuntary Status: Voluntary  Guardian? Yes  Jamie [name], Jamie [name] (Mother)   [phone]   Annan, Lemarr (Father)  [phone]     MEDICATIONS   Current Psychotropic Medications:   1. Invega Sustenna IM every 28 days, last 04/20/21  2. Depakote ER 2500 mg QHS     ASSESSMENT & DIAGNOSES   Jamie [name]?is a 25 y.o.?Caucasian?adult?with a history of schizoaffective disorder and polysubstance abuse?who presented to [facility] as a transfer from OSH?with manic symptoms in context of methamphetamine use. This appears to be precipitated by?methamphetamine use. Factors that seem to have predisposed?him?to mania?include schizoaffective disorder, substance abuse and uncle with schizophrenia. This current problem is maintained by?ongoing substance abuse, lack of consistent medication compliance, and poor insight. However, protective factors include?good support from parents who are guardians, motivation to seek treatment, and fair insight into current situation. Proposed treatment will consist of?substance detoxification and medication management.    DSM-5 DIAGNOSES:  1. Stimulant use disorder, methamphetamine type, moderate  2. Methamphetamine withdrawal, resolved  3. Schizoaffective disorder, bipolar type  4. Tobacco use disorder (chewing tobacco), severe  5. Stimulant use disorder, cocaine type, unspecified severity     PLAN     ? No indications for medication changes for 7/6.   ? Received Invega Sustenna 156 mg IM on 04/20/21  ? Continue Depakote ER PTA dose 2500 mg QHS for schizoaffective disorder and mood stabilization  ? Level 1 CARES assessment completed 6/8. Level 1 recommended Level 2 assessment.?Pending at this time.  ? SW interventions, reaching out to facilities for placement.    Discussed and seen with Dr. Sherrine Maples.        Metabolic labs completed on:  Lipid Panel:   LDL   Date Value Ref Range Status   03/19/2021 106 (H) <100 mg/dL Final     HDL   Date Value Ref Range Status   03/19/2021 55 >40 MG/DL Final     VLDL   Date Value Ref Range Status   03/19/2021 15 MG/DL Final     Cholesterol   Date Value Ref Range Status   03/19/2021 174 <200 MG/DL Final     Triglycerides   Date Value Ref Range Status   03/19/2021 76 <150 MG/DL Final       Blood Glucose:  Hemoglobin A1C   Date Value Ref Range Status   03/19/2021 5.3 4.0 - 6.0 % Final     Comment:     The ADA recommends that most patients with type 1 and type 2 diabetes maintain   an A1c level <7%.       ______________________________________________________________     Jamie [name] was seen today for follow up. Reports mood is similar, good today. Denies SI/HI/AVH. Reports good appetite/sleep, says his workout routine is still going well.      REVIEW OF SYSTEMS   Review of Systems   Respiratory: Negative for cough and shortness of breath.    Cardiovascular: Negative for chest pain.   Gastrointestinal: Negative for constipation, diarrhea, nausea and vomiting.   Genitourinary: Negative for difficulty urinating.   Musculoskeletal: Negative for arthralgias.   Skin: Negative for color change.   Neurological: Negative for headaches.   Psychiatric/Behavioral: Negative for hallucinations and suicidal ideas. The patient is not nervous/anxious.           OBJECTIVE  Vital Signs:  Current                Vital Signs: 24 Hour Range   BP: 100/54 (07/06 0700)  Temp: 36.8 ?C (98.2 ?F) (07/06 0700)  Pulse: 62 (07/06 0700)  Respirations: 18 PER MINUTE (07/06 0700)  SpO2: 96 % (07/06 0700)  O2 Device: None (Room air) (07/05 1914) BP: (100-109)/(54-66)   Temp:  [36.8 ?C (98.2 ?F)-37.1 ?C (98.8 ?F)]   Pulse:  [62-88]   Respirations:  [18 PER MINUTE]   SpO2:  [96 %-98 %]   O2 Device: None (Room air)   Intensity Pain Scale (Self Report): (not recorded)      Sleep:  Hours of Sleep: 6.75   Quality of Sleep: Resting Quietly    Scheduled Medications:  divalproex (DEPAKOTE ER) ER tablet 2,500 mg, 2,500 mg, Oral, QHS  nicotine (NICODERM CQ STEP 1) 21 mg/day patch 1 patch, 1 patch, Transdermal, QDAY  paliperidone palmitate (INVEGA) (+) injection 156 mg, 156 mg, Intramuscular, Q28 Days (4 weeks)        PRN Medications:  acetaminophen Q6H PRN, calcium carbonate TID PRN, nicotine polacrilex Q2H PRN 4 mg at 05/04/21 1750, OLANZapine Q6H PRN **OR** OLANZapine Q6H PRN, polyethylene glycol 3350 QDAY PRN, traZODone QHS PRN 50 mg at 05/03/21 2148    Mental Status Exam:  ? General/Constitutional: 25 y.o. adult, appears stated age, dressed in?hospital?attire, fair hygiene   ? Behavior: calm, cooperative, pleasant  ? Speech/Motor: rrr with good articulation  ? Eye Contact: good  ? Mood: good  ? Affect: full, mood congruent  ? Thought Process: linear and goal directed  ? Associations: Intact  ? Thought Content: Denies SI/HI. Shows no evidence of delusions or paranoia   ? Perception: Denies AVH. Continues to report floaters. Does not appear to be responding to internal stimuli   ? Insight/Judgment: fair/fair    Focused Physical Exam:  ? Musculoskeletal: able to arise from seated position without assistance  ? Neurological: no UE tremor  ? Pulm: Patient is respirating in no apparent distress, no cough  ? Gait: Ambulates independently without difficulty   ______________________________________________________________  Raquel Fariha Goto, MD

## 2021-05-05 NOTE — Progress Notes
Chart check completed.

## 2021-05-05 NOTE — Care Coordination-Inpatient
Care Coordination - Inpatient Note      Patient Name:   Jamie [name]                        MRN:  [id number]  Admission Date:  03/16/2021    11:21 am  CM called Shanda Bumps at [facility] [phone]). Shanda Bumps reports that Pt's Level 2 screen will be completed by [facility]. Adriana Reams will be Pt's assessor, Shanda Bumps will give Gregary Signs this CM's contact information for him to reach out and schedule Pt's assessment.    [employer]  05/05/2021

## 2021-05-05 NOTE — Care Plan
Pt visible in the dining area this evening but kept mostly to himself. He stated he was avoiding the day room because of "all the drama." Pt denied any current SI/HI and reported feeling normal and eager to be discharged. He declined earlier requested Trazodone stating he thinks it might e making him groggy in the mornings. Will continue to monitor for safety and behavior.     Problem: Health Maintenance - Impaired  Goal: Establish therapeutic relationships  Outcome: Goal Ongoing  Goal: Knowledge of health maintenance  Outcome: Goal Ongoing     Problem: Social Interaction - Impaired  Goal: Increased Social Interaction  Outcome: Goal Ongoing

## 2021-05-06 MED ORDER — NICOTINE 21 MG/24 HR TD PT24
1 | MEDICATED_PATCH | Freq: Every day | TRANSDERMAL | 0 refills | Status: CN
Start: 2021-05-06 — End: ?

## 2021-05-06 NOTE — Care Plan
Problem: Health Maintenance - Impaired  Goal: Knowledge of health maintenance  Outcome: Goal Ongoing     Problem: Coping - Ineffective, Family  Goal: Communicate with support staff  Outcome: Goal Ongoing     Problem: Coping - Ineffective, Family  Goal: Knowledge of positive coping methods  Outcome: Goal Ongoing

## 2021-05-06 NOTE — Progress Notes
Pt was seen in the milieu most of the evening interacting with peers, playing cards and watching TV. Pt denied depression, anxiety, SI, HI and pain. When asked about AVH pt reported "just the same as always" for [facility]. Pt requested then refused Trazodone for sleep stating he skipped it last night and was able to sleep without it and felt less "groggy" in the morning. Pt compliant with prescribed HS medications and received nicotine gum PRN. Pt will continue to be monitored per protocol. Pt encouraged to come to staff with questions or concerns.

## 2021-05-06 NOTE — Progress Notes
Chart check completed.

## 2021-05-06 NOTE — Progress Notes
[facility]  Therapy Progress Note    NAME:Jamie [name] MRN: [id number] DOB:05/10/1996 AGE: 25 y.o.  ADMISSION DATE: 03/16/2021 DAYS ADMITTED: LOS: 50 days    Date of Service:  05/06/21    Active Problems:    Schizoaffective disorder, bipolar type (HCC)    Tobacco use disorder, severe, dependence    Withdrawal from methamphetamine [facility])    Methamphetamine use disorder, moderate (HCC)    Objective:  Therapist approached Patient as he was walking up/down the hallway.  Eye contact WNL.  Affect full; mood congruent.    Narrative:  Patient reported that he has been accepted to a facility.  He joked, "I go from one locked facility to another."  Although this statement was clearly made in jest, time was spent talking about how Patient needs to maximize his opportunities for success.  Patient acknowledged ongoing structure and routine will be ultimately beneficial.    Patient also talked about some of his past admissions to [facility] and sessions with this Therapist.  Again, he displayed an adaptive/appropriate sense of humor when he acknowledged some of his past problematic behaviors.  Patient was also able to appropriately identify mistakes in judgement he made in the past; he reiterated his desire to try to eliminate (or at least significantly reduce) impulsivity.    Follow up:  Treatment Team will continue to follow.

## 2021-05-06 NOTE — Progress Notes
Chart check completed

## 2021-05-06 NOTE — Care Plan
Patient to participate in level 2 screen Monday.  He is anticipating discharge to [facility].  Has been appropriate in the milieu,interacting with staff, eating and drinking, compliant with medication, pacing the hallways.    Problem: Health Maintenance - Impaired  Goal: Establish therapeutic relationships  Outcome: Goal Ongoing  Goal: Knowledge of health maintenance  Outcome: Goal Ongoing     Problem: Coping - Ineffective, Family  Goal: Effective Coping  Outcome: Goal Ongoing  Goal: Communicate with support staff  Outcome: Goal Ongoing  Goal: Knowledge of positive coping methods  Outcome: Goal Ongoing     Problem: Social Interaction - Impaired  Goal: Increased Social Interaction  Outcome: Goal Ongoing  Goal: Knowledge of social interaction  Outcome: Goal Ongoing     Problem: Discharge Planning  Goal: Participation in plan of care  Outcome: Goal Ongoing

## 2021-05-06 NOTE — Progress Notes
PSYCHIATRIC PROGRESS NOTE     LOS: 50 days  Voluntary/Involuntary Status: Voluntary  Guardian? Yes  Sumedh, Shinsato (Mother)   [phone]   Lamark, Schue (Father)  [phone]     MEDICATIONS   Current Psychotropic Medications:   1. Invega Sustenna IM every 28 days, last 04/20/21  2. Depakote ER 2500 mg QHS     ASSESSMENT & DIAGNOSES   Nishanth Mccaughan Manke?is a 25 y.o.?Caucasian?adult?with a history of schizoaffective disorder and polysubstance abuse?who presented to [facility] as a transfer from OSH?with manic symptoms in context of methamphetamine use. This appears to be precipitated by?methamphetamine use. Factors that seem to have predisposed?him?to mania?include schizoaffective disorder, substance abuse and uncle with schizophrenia. This current problem is maintained by?ongoing substance abuse, lack of consistent medication compliance, and poor insight. However, protective factors include?good support from parents who are guardians, motivation to seek treatment, and fair insight into current situation. Proposed treatment will consist of?substance detoxification and medication management.    DSM-5 DIAGNOSES:  1. Stimulant use disorder, methamphetamine type, moderate  2. Methamphetamine withdrawal, resolved  3. Schizoaffective disorder, bipolar type  4. Tobacco use disorder (chewing tobacco), severe  5. Stimulant use disorder, cocaine type, unspecified severity     PLAN     ? No indications for medication changes for 7/7.   ? Received Invega Sustenna 156 mg IM on 04/20/21  ? Continue Depakote ER PTA dose 2500 mg QHS for schizoaffective disorder and mood stabilization  ? Level 1 CARES assessment completed 6/8. Level 1 recommended Level 2 assessment.?Pending at this time.  ? SW interventions, has placement after level 2 completion.     Discussed and seen with Dr. Sherrine Maples.        Metabolic labs completed on:  Lipid Panel:   LDL   Date Value Ref Range Status   03/19/2021 106 (H) <100 mg/dL Final     HDL   Date Value Ref Range Status   03/19/2021 55 >40 MG/DL Final     VLDL   Date Value Ref Range Status   03/19/2021 15 MG/DL Final     Cholesterol   Date Value Ref Range Status   03/19/2021 174 <200 MG/DL Final     Triglycerides   Date Value Ref Range Status   03/19/2021 76 <150 MG/DL Final       Blood Glucose:  Hemoglobin A1C   Date Value Ref Range Status   03/19/2021 5.3 4.0 - 6.0 % Final     Comment:     The ADA recommends that most patients with type 1 and type 2 diabetes maintain   an A1c level <7%.       ______________________________________________________________     Anastasio Auerbach Gage was seen today for follow up. Mood is good, denies anxiety/depression. Denies SI/HI/AVH. Good sleep/appetite.      REVIEW OF SYSTEMS   Review of Systems   Respiratory: Negative for cough and shortness of breath.    Cardiovascular: Negative for chest pain.   Gastrointestinal: Negative for constipation, diarrhea, nausea and vomiting.   Genitourinary: Negative for difficulty urinating.   Musculoskeletal: Negative for arthralgias.   Skin: Negative for color change.   Neurological: Negative for headaches.   Psychiatric/Behavioral: Negative for hallucinations and suicidal ideas. The patient is not nervous/anxious.           OBJECTIVE                    Vital Signs:  Current  Vital Signs: 24 Hour Range   BP: 100/52 (07/07 0700)  Temp: 36.5 ?C (97.7 ?F) (07/07 0700)  Pulse: 63 (07/07 0700)  Respirations: 16 PER MINUTE (07/07 0700)  SpO2: 99 % (07/07 0700) BP: (100-118)/(52-69)   Temp:  [36.2 ?C (97.2 ?F)-36.5 ?C (97.7 ?F)]   Pulse:  [63-84]   Respirations:  [16 PER MINUTE-17 PER MINUTE]   SpO2:  [96 %-99 %]    Intensity Pain Scale (Self Report): (not recorded)      Sleep:  Hours of Sleep: 5.75   Quality of Sleep: Resting Quietly    Scheduled Medications:  divalproex (DEPAKOTE ER) ER tablet 2,500 mg, 2,500 mg, Oral, QHS  nicotine (NICODERM CQ STEP 1) 21 mg/day patch 1 patch, 1 patch, Transdermal, QDAY  paliperidone palmitate (INVEGA) (+) injection 156 mg, 156 mg, Intramuscular, Q28 Days (4 weeks)        PRN Medications:  acetaminophen Q6H PRN, calcium carbonate TID PRN, nicotine polacrilex Q2H PRN 4 mg at 05/05/21 2016, OLANZapine Q6H PRN **OR** OLANZapine Q6H PRN, polyethylene glycol 3350 QDAY PRN, traZODone QHS PRN 50 mg at 05/03/21 2148    Mental Status Exam:  ? General/Constitutional: 25 y.o. adult, appears stated age, dressed in?hospital?attire, fair hygiene   ? Behavior: calm, cooperative, pleasant  ? Speech/Motor: rrr with good articulation  ? Eye Contact: good  ? Mood: good  ? Affect: full, mood congruent  ? Thought Process: linear and goal directed  ? Associations: Intact  ? Thought Content: Denies SI/HI. Shows no evidence of delusions or paranoia   ? Perception: Denies AVH. Continues to report floaters. Does not appear to be responding to internal stimuli   ? Insight/Judgment: fair/fair    Focused Physical Exam:  ? Musculoskeletal: able to arise from seated position without assistance  ? Neurological: no UE tremor  ? Pulm: Patient is respirating in no apparent distress, no cough  ? Gait: Ambulates independently without difficulty   ______________________________________________________________  Raquel Lenore Moyano, MD

## 2021-05-06 NOTE — Behavioral Health Treatment Team
TREATMENT TEAM NOTE  Name: Leslie Langille        MRN: [id number]          DOB: [birth year]            Age: 25 y.o.  Admission Date: 03/16/2021       LOS: 50 days    Date of Service: 05/06/2021        Attendees:  UR: Ival Bible, LMSW  Nursing: Raynaldo Opitz, RN  Therapy: Marijean Heath, LMLP, LCP  CM: Henderson Cloud, LMSW  CM: Neysa Bonito, LPC  CM: Berle Mull, [facility], MT-BC  Rec Therapy: Clare Charon, CTRS  Nurse Manager: Ursula Beath, RN  Pharmacy: Darrin Luis, PharmD  Psychology: Assunta Gambles. Lowry Bowl, PhD  Psychology: Peter Minium, PhD  Psychiatry: Shella Maxim. Sherrine Maples, DO       Significant Points Discussed: Did not attend groups, med compliant, will have a level 2 screening this Monday at 13:00.    Treatment Plan Discussion: Waiting for placement

## 2021-05-07 NOTE — Behavioral Health Treatment Team
TREATMENT TEAM NOTE  Name: Jamie [name]        MRN: [id number]          DOB: 16-Jul-1996            Age: 25 y.o.  Admission Date: 03/16/2021       LOS: 51 days    Date of Service: 05/07/2021        Attendees:  UR: Ival Bible, LMSW  Nursing: Freddie Apley, RN, BSN  Therapy: Marijean Heath, LMLP, LCP  CM: Neysa Bonito, [facility]  CM: Eden Lathe, LPC  CM: Berle Mull, [facility], MT-BC  Activities Coordinator: Philippa Chester  Pharmacy: Darrin Luis, PharmD  Psychiatry: Shella Maxim. Sherrine Maples, DO  Psychiatry: Frankey Poot, APRN-NP  Psychiatry Resident: Doylene Bode, MD  Psychiatry Resident: Chauncy Passy, DO    Significant Points Discussed: Focused on the future, engaged in groups, was accepted into [facility].     Treatment Plan Discussion: No updates

## 2021-05-07 NOTE — Care Plan
Jamie [name] is compliant with medications.  Pt has visual hallucinations of spots of lights, floaters and stars.  Pt denies AH.   Pt denies SIHISH.  Pt states slept pretty good and his appetite is fine.  Pt states his mood is pretty good r/t discharging soon and finding placement.  Pt denies anxiety and depression. Jamie [name] encouraged to attend groups.  He denies any questions or concerns at this time.   Problem: Health Maintenance - Impaired  Goal: Establish therapeutic relationships  Outcome: Goal Ongoing  Goal: Knowledge of health maintenance  Outcome: Goal Ongoing     Problem: Coping - Ineffective, Family  Goal: Effective Coping  Outcome: Goal Ongoing  Goal: Communicate with support staff  Outcome: Goal Ongoing  Goal: Knowledge of positive coping methods  Outcome: Goal Ongoing     Problem: Social Interaction - Impaired  Goal: Increased Social Interaction  Outcome: Goal Ongoing  Goal: Knowledge of social interaction  Outcome: Goal Ongoing     Problem: Discharge Planning  Goal: Participation in plan of care  Outcome: Goal Ongoing

## 2021-05-07 NOTE — Care Plan
Patient calm, cooperative, and follows commands upon assessment. Patient spent most of the evening in the dayroom socializing with peers. Patient medication compliant. Patient denies A/D/P/SI/HI.      Problem: Health Maintenance - Impaired  Goal: Establish therapeutic relationships  Outcome: Goal Ongoing  Goal: Knowledge of health maintenance  Outcome: Goal Ongoing     Problem: Coping - Ineffective, Family  Goal: Effective Coping  Outcome: Goal Ongoing  Goal: Communicate with support staff  Outcome: Goal Ongoing  Goal: Knowledge of positive coping methods  Outcome: Goal Ongoing     Problem: Social Interaction - Impaired  Goal: Increased Social Interaction  Outcome: Goal Ongoing  Goal: Knowledge of social interaction  Outcome: Goal Ongoing     Problem: Discharge Planning  Goal: Participation in plan of care  Outcome: Goal Ongoing

## 2021-05-07 NOTE — Progress Notes
PSYCHIATRIC PROGRESS NOTE     LOS: 51 days  Voluntary/Involuntary Status: Voluntary  Guardian? Yes  Jamie [name], Jamie (Mother)   [phone]   Jamie [name], Jamie (Father)  [phone]     MEDICATIONS   Current Psychotropic Medications:   1. Invega Sustenna IM every 28 days, last 04/20/21  2. Depakote ER 2500 mg QHS     ASSESSMENT & DIAGNOSES   Jamie [name]?is a 25 y.o.?Caucasian?adult?with a history of schizoaffective disorder and polysubstance abuse?who presented to [facility] as a transfer from OSH?with manic symptoms in context of methamphetamine use. This appears to be precipitated by?methamphetamine use. Factors that seem to have predisposed?him?to mania?include schizoaffective disorder, substance abuse and uncle with schizophrenia. This current problem is maintained by?ongoing substance abuse, lack of consistent medication compliance, and poor insight. However, protective factors include?good support from parents who are guardians, motivation to seek treatment, and fair insight into current situation. Proposed treatment will consist of?substance detoxification and medication management.    DSM-5 DIAGNOSES:  1. Stimulant use disorder, methamphetamine type, moderate  2. Methamphetamine withdrawal, resolved  3. Schizoaffective disorder, bipolar type  4. Tobacco use disorder (chewing tobacco), severe  5. Stimulant use disorder, cocaine type, unspecified severity     PLAN     ? No indications for medication changes for 7/8.   ? Received Invega Sustenna 156 mg IM on 04/20/21  ? Continue Depakote ER PTA dose 2500 mg QHS for schizoaffective disorder and mood stabilization  ? Level 1 CARES assessment completed 6/8. Level 1 recommended Level 2 assessment.?Level 2 scheduled for Monday 7/11.   ? SW interventions, has placement after level 2 completion.     Discussed and seen with Dr. Sherrine Maples.        Metabolic labs completed on:  Lipid Panel:   LDL   Date Value Ref Range Status   03/19/2021 106 (H) <100 mg/dL Final     HDL   Date Value Ref Range Status   03/19/2021 55 >40 MG/DL Final     VLDL   Date Value Ref Range Status   03/19/2021 15 MG/DL Final     Cholesterol   Date Value Ref Range Status   03/19/2021 174 <200 MG/DL Final     Triglycerides   Date Value Ref Range Status   03/19/2021 76 <150 MG/DL Final       Blood Glucose:  Hemoglobin A1C   Date Value Ref Range Status   03/19/2021 5.3 4.0 - 6.0 % Final     Comment:     The ADA recommends that most patients with type 1 and type 2 diabetes maintain   an A1c level <7%.       ______________________________________________________________     Jamie [name] was seen today for follow up. Reports mood is really good today, no concerns. Denies SI/HI/AVH. Sleeping well, continuing his exercise program, reports good appetite. Looking forward to his level 2 assessment being completed Monday.      REVIEW OF SYSTEMS   Review of Systems   Respiratory: Negative for cough and shortness of breath.    Cardiovascular: Negative for chest pain.   Gastrointestinal: Negative for constipation, diarrhea, nausea and vomiting.   Genitourinary: Negative for difficulty urinating.   Musculoskeletal: Negative for arthralgias.   Skin: Negative for color change.   Neurological: Negative for headaches.   Psychiatric/Behavioral: Negative for hallucinations and suicidal ideas. The patient is not nervous/anxious.           OBJECTIVE  Vital Signs:  Current                Vital Signs: 24 Hour Range   BP: 91/46 (07/08 0700)  Temp: 36.8 ?C (98.2 ?F) (07/08 0700)  Pulse: 61 (07/08 0700)  Respirations: 14 PER MINUTE (07/08 0700)  SpO2: 97 % (07/08 0700)  O2 Device: None (Room air) (07/07 1918) BP: (91-137)/(46-76)   Temp:  [36.8 ?C (98.2 ?F)]   Pulse:  [61-66]   Respirations:  [14 PER MINUTE-18 PER MINUTE]   SpO2:  [97 %-98 %]   O2 Device: None (Room air)   Intensity Pain Scale (Self Report): (not recorded)      Sleep:  Hours of Sleep: 5.75   Quality of Sleep: Resting Quietly    Scheduled Medications:  divalproex (DEPAKOTE ER) ER tablet 2,500 mg, 2,500 mg, Oral, QHS  nicotine (NICODERM CQ STEP 1) 21 mg/day patch 1 patch, 1 patch, Transdermal, QDAY  paliperidone palmitate (INVEGA) (+) injection 156 mg, 156 mg, Intramuscular, Q28 Days (4 weeks)        PRN Medications:  acetaminophen Q6H PRN, calcium carbonate TID PRN, nicotine polacrilex Q2H PRN 4 mg at 05/06/21 2022, OLANZapine Q6H PRN **OR** OLANZapine Q6H PRN, polyethylene glycol 3350 QDAY PRN, traZODone QHS PRN 50 mg at 05/03/21 2148    Mental Status Exam:  ? General/Constitutional: 25 y.o. adult, appears stated age, dressed in?hospital?attire, fair hygiene   ? Behavior: calm, cooperative, pleasant  ? Speech/Motor: rrr with good articulation  ? Eye Contact: good  ? Mood: good  ? Affect: full, mood congruent  ? Thought Process: linear and goal directed  ? Associations: Intact  ? Thought Content: Denies SI/HI. Shows no evidence of delusions or paranoia   ? Perception: Denies AVH. Continues to report floaters. Does not appear to be responding to internal stimuli   ? Insight/Judgment: fair/fair    Focused Physical Exam:  ? Musculoskeletal: able to arise from seated position without assistance  ? Neurological: no UE tremor  ? Pulm: Patient is respirating in no apparent distress, no cough  ? Gait: Ambulates independently without difficulty   ______________________________________________________________  Jamie Taos Tapp, MD

## 2021-05-08 NOTE — Care Plan
Patient has been in and out of milieu with minimal group attendance. He denied SI/HI,AVH. He is looking forward to discharge next week. He is cooperative with care and medications.      Problem: Health Maintenance - Impaired  Goal: Establish therapeutic relationships  Outcome: Goal Ongoing     Problem: Coping - Ineffective, Family  Goal: Effective Coping  Outcome: Goal Ongoing     Problem: Social Interaction - Impaired  Goal: Increased Social Interaction  Outcome: Goal Ongoing     Problem: Discharge Planning  Goal: Participation in plan of care  Outcome: Goal Ongoing

## 2021-05-08 NOTE — Progress Notes
PSYCHIATRIC PROGRESS NOTE     LOS: 52 days  Voluntary/Involuntary Status: Voluntary  Guardian? Yes  Wilho, Sharpley (Mother)   [phone]   Karver, Fadden (Father)  [phone]     MEDICATIONS   Current Psychotropic Medications:   Gean Birchwood IM every 28 days, last 04/20/21  Depakote ER 2500 mg QHS     ASSESSMENT & DIAGNOSES   Jamie [name] is a 25 y.o. Caucasian adult with a history of schizoaffective disorder and polysubstance abuse who presented to [facility] as a transfer from OSH with manic symptoms in context of methamphetamine use. This appears to be precipitated by methamphetamine use. Factors that seem to have predisposed him to mania include schizoaffective disorder, substance abuse and uncle with schizophrenia. This current problem is maintained by ongoing substance abuse, lack of consistent medication compliance, and poor insight. However, protective factors include good support from parents who are guardians, motivation to seek treatment, and fair insight into current situation. Proposed treatment will consist of substance detoxification and medication management.    DSM-5 DIAGNOSES:  Stimulant use disorder, methamphetamine type, moderate  Methamphetamine withdrawal, resolved  Schizoaffective disorder, bipolar type  Tobacco use disorder (chewing tobacco), severe  Stimulant use disorder, cocaine type, unspecified severity     PLAN     No indications for medication changes for 7/9.   Received [name] 156 mg IM on 04/20/21  Continue Depakote ER PTA dose 2500 mg QHS for schizoaffective disorder and mood stabilization  Level 1 CARES assessment completed 6/8. Level 1 recommended Level 2 assessment. Level 2 scheduled for Monday 7/11.   SW interventions, has placement after level 2 completion.     Discussed and seen with Dr. Pearlean Brownie.        Metabolic labs completed on:  Lipid Panel:   LDL   Date Value Ref Range Status   03/19/2021 106 (H) <100 mg/dL Final     HDL   Date Value Ref Range Status 03/19/2021 55 >40 MG/DL Final     VLDL   Date Value Ref Range Status   03/19/2021 15 MG/DL Final     Cholesterol   Date Value Ref Range Status   03/19/2021 174 <200 MG/DL Final     Triglycerides   Date Value Ref Range Status   03/19/2021 76 <150 MG/DL Final       Blood Glucose:  Hemoglobin A1C   Date Value Ref Range Status   03/19/2021 5.3 4.0 - 6.0 % Final     Comment:     The ADA recommends that most patients with type 1 and type 2 diabetes maintain   an A1c level <7%.       ______________________________________________________________     Jamie [name] was seen today for follow up. Appears in good spirits. Says that he has no overnight concerns. No agitation overnight. Denies SI HI AH VH.   No concerns with sleep, appetite, energy. Looking forward to his level 2 assessment being completed Monday. Says that he is ready to have a change in scenery.      REVIEW OF SYSTEMS   Review of Systems   Respiratory: Negative for cough and shortness of breath.    Cardiovascular: Negative for chest pain.   Gastrointestinal: Negative for constipation, diarrhea, nausea and vomiting.   Genitourinary: Negative for difficulty urinating.   Musculoskeletal: Negative for arthralgias.   Skin: Negative for color change.   Neurological: Negative for headaches.   Psychiatric/Behavioral: Negative for hallucinations and suicidal  ideas. The patient is not nervous/anxious.           OBJECTIVE                    Vital Signs:  Current                Vital Signs: 24 Hour Range   BP: 128/66 (07/08 1935)  Temp: 36.9 ?C (98.4 ?F) (07/08 1935)  Pulse: 64 (07/08 1935)  Respirations: 16 PER MINUTE (07/08 1935)  SpO2: 98 % (07/08 1935) BP: (128)/(66)   Temp:  [36.9 ?C (98.4 ?F)]   Pulse:  [64]   Respirations:  [16 PER MINUTE]   SpO2:  [98 %]    Intensity Pain Scale (Self Report): (not recorded)      Sleep:  Hours of Sleep: 6.5   Quality of Sleep: Resting Quietly    Scheduled Medications:  divalproex (DEPAKOTE ER) ER tablet 2,500 mg, 2,500 mg, Oral, QHS  nicotine (NICODERM CQ STEP 1) 21 mg/day patch 1 patch, 1 patch, Transdermal, QDAY  paliperidone palmitate (INVEGA) (+) injection 156 mg, 156 mg, Intramuscular, Q28 Days (4 weeks)        PRN Medications:  acetaminophen Q6H PRN, calcium carbonate TID PRN, nicotine polacrilex Q2H PRN 4 mg at 05/07/21 2100, OLANZapine Q6H PRN **OR** OLANZapine Q6H PRN, polyethylene glycol 3350 QDAY PRN, traZODone QHS PRN 50 mg at 05/03/21 2148    Mental Status Exam:  General/Constitutional: 25 y.o. adult, appears stated age, dressed in hospital attire, fair hygiene   Behavior: calm, cooperative, pleasant  Speech/Motor: rrr with good articulation  Eye Contact: good  Mood: fine  Affect: full, mood congruent  Thought Process: linear and goal directed  Associations: Intact  Thought Content: Denies SI/HI. Shows no evidence of delusions or paranoia   Perception: Denies AVH. + visual disturbances of floaters. Does not appear to be responding to internal stimuli   Insight/Judgment: fair/fair    Focused Physical Exam:  Musculoskeletal: able to arise from seated position without assistance, moves all four extremities spontaneously and without difficulty   Neurological: nonfocal grossly   Gait: Ambulates independently without difficulty, nonataxic nonantalgic gait    ______________________________________________________________  Noni Saupe, MD

## 2021-05-08 NOTE — Progress Notes
Chart check completed.

## 2021-05-08 NOTE — Care Plan
Pt was up and about in the milieu.Bright affect.He expressed regret in letting his friends use his apt and that 's how he ended up losing it.He was looking back at how friends had used him as he lend them money and they never gave it back to him. Continues to see dots floating.Denied si/hi/ah/paranoia.He was pleasant,calm, co-perative and med compliant.Will continue to monitor.

## 2021-05-09 NOTE — Care Plan
Pt noted to be up and about in the milieu in bright affect.he smiles and makes needs known politely.He is looking forward to discharge and mentioned to be grateful to be going to a  sort of controlled environment where people/friends will not take advantage of him financially.Continues to see floats and dots  in his visual space. Calm,co-operative and med compliant.Denied si/hi/ah/paranoia/depression.anxiety.Will continue to monitor.

## 2021-05-09 NOTE — Progress Notes
PSYCHIATRIC PROGRESS NOTE     LOS: 53 days  Voluntary/Involuntary Status: Voluntary  Guardian? Yes  Waylin, Dorko (Mother)   [phone]   Ronnell, Clinger (Father)  [phone]     MEDICATIONS   Current Psychotropic Medications:   1. [name] IM q4weeks, last dose 04/20/21, next dose ~ 05/18/21 +/- 4 day window 05/14/21-05/22/21  2. Depakote ER 2500 mg QHS     ASSESSMENT & DIAGNOSES   Garron Eline is a 25 y.o. Caucasian adult with a history of schizoaffective disorder and polysubstance abuse who presented to [facility] as a transfer from OSH with manic symptoms in context of methamphetamine use. This appears to be precipitated by methamphetamine use. Factors that seem to have predisposed him to mania include schizoaffective disorder, substance abuse and uncle with schizophrenia. This current problem is maintained by ongoing substance abuse, lack of consistent medication compliance, and poor insight. However, protective factors include good support from parents who are guardians, motivation to seek treatment, and fair insight into current situation. Proposed treatment will consist of substance detoxification and medication management.    DSM-5 DIAGNOSES:  1. Stimulant use disorder, methamphetamine type, moderate  2. Methamphetamine withdrawal, resolved  3. Schizoaffective disorder, bipolar type  4. Tobacco use disorder (chewing tobacco), severe  5. Stimulant use disorder, cocaine type, unspecified severity     PLAN     ? 05/09/21: no changes, continue plan of care as per primary team outlined below:     ? Received Invega Sustenna 156 mg IM on 04/20/21  ? Continue Depakote ER PTA dose 2500 mg QHS for schizoaffective disorder and mood stabilization  ? Level 1 CARES assessment completed 6/8. Level 1 recommended Level 2 assessment. Level 2 scheduled for Monday 7/11.   ? SW interventions, has placement after level 2 completion.     Discussed and seen with Dr. Pearlean Brownie.        Metabolic labs completed on:  Lipid Panel: LDL   Date Value Ref Range Status   03/19/2021 106 (H) <100 mg/dL Final     HDL   Date Value Ref Range Status   03/19/2021 55 >40 MG/DL Final     VLDL   Date Value Ref Range Status   03/19/2021 15 MG/DL Final     Cholesterol   Date Value Ref Range Status   03/19/2021 174 <200 MG/DL Final     Triglycerides   Date Value Ref Range Status   03/19/2021 76 <150 MG/DL Final       Blood Glucose:  Hemoglobin A1C   Date Value Ref Range Status   03/19/2021 5.3 4.0 - 6.0 % Final     Comment:     The ADA recommends that most patients with type 1 and type 2 diabetes maintain   an A1c level <7%.       ______________________________________________________________     SUBJECTIVE     Chart reviewed: no PRN usage since last progress note documentation; nursing documentation indicates patient slept 6.75 hours and has been eating 100% of his meals     Mr. Sevey interviewed at bedside this morning. Sleeping well, appetite is fine. He has no concerns and denies SI/HI/AVH.      REVIEW OF SYSTEMS   Review of Systems   Respiratory: Negative for cough and shortness of breath.    Cardiovascular: Negative for chest pain.   Gastrointestinal: Negative for constipation, diarrhea, nausea and vomiting.   Genitourinary: Negative for difficulty urinating.   Musculoskeletal: Negative for arthralgias.  Skin: Negative for color change.   Neurological: Negative for headaches.   Psychiatric/Behavioral: Negative for hallucinations and suicidal ideas. The patient is not nervous/anxious.         OBJECTIVE                    Vital Signs:  Current                Vital Signs: 24 Hour Range   BP: 109/68 (07/10 0700)  Temp: 36.6 ?C (97.9 ?F) (07/10 0700)  Pulse: 61 (07/10 0700)  Respirations: 18 PER MINUTE (07/10 0700)  SpO2: 97 % (07/10 0700) BP: (109-135)/(68-90)   Temp:  [36.6 ?C (97.9 ?F)-36.7 ?C (98.1 ?F)]   Pulse:  [61-77]   Respirations:  [18 PER MINUTE]   SpO2:  [97 %-98 %]    Intensity Pain Scale (Self Report): (not recorded)      Sleep:  Hours of Sleep: 6.75   Quality of Sleep: Resting Quietly    Scheduled Medications:  divalproex (DEPAKOTE ER) ER tablet 2,500 mg, 2,500 mg, Oral, QHS  nicotine (NICODERM CQ STEP 1) 21 mg/day patch 1 patch, 1 patch, Transdermal, QDAY  paliperidone palmitate (INVEGA) (+) injection 156 mg, 156 mg, Intramuscular, Q28 Days (4 weeks)        PRN Medications:  acetaminophen Q6H PRN, calcium carbonate TID PRN, nicotine polacrilex Q2H PRN 4 mg at 05/08/21 2030, OLANZapine Q6H PRN **OR** OLANZapine Q6H PRN, polyethylene glycol 3350 QDAY PRN, traZODone QHS PRN 50 mg at 05/03/21 2148    Mental Status Exam:  ? General/Constitutional: 25 y.o. adult, appears stated age, dressed in hospital attire, fair hygiene   ? Behavior: calm, cooperative, pleasant  ? Speech/Motor: rrr with good articulation  ? Eye Contact: good  ? Mood: fine  ? Affect: full, mood congruent  ? Thought Process: linear and goal directed  ? Associations: Intact  ? Thought Content: Denies SI/HI. Shows no evidence of delusions or paranoia   ? Perception: Denies AVH. + visual disturbances of floaters. Does not appear to be responding to internal stimuli   ? Insight/Judgment: fair/fair    Focused Physical Exam:  ? Gait: no ataxia, normal arm sway   ? Neurological: no tic or tremor    ______________________________________________________________  Leticia Clas, DO

## 2021-05-09 NOTE — Progress Notes
Chart check completed.

## 2021-05-09 NOTE — Progress Notes
Chart check completed

## 2021-05-10 MED ORDER — NICOTINE 14 MG/24 HR TD PT24
1 | Freq: Every day | TRANSDERMAL | 0 refills | Status: DC
Start: 2021-05-10 — End: 2021-06-11
  Administered 2021-05-10 – 2021-06-11 (×33): 1 via TRANSDERMAL

## 2021-05-10 NOTE — Progress Notes
PSYCHIATRIC PROGRESS NOTE     LOS: 54 days  Voluntary/Involuntary Status: Voluntary  Guardian? Yes  Shah, Charon (Mother)   [phone]   Urho, Mandel (Father)  [phone]     MEDICATIONS   Current Psychotropic Medications:   1. [name] IM q4weeks, last dose 04/20/21, next dose ~ 05/18/21 +/- 4 day window 05/14/21-05/22/21  2. Depakote ER 2500 mg QHS     ASSESSMENT & DIAGNOSES   Jamie [name] is a 25 y.o. Caucasian adult with a history of schizoaffective disorder and polysubstance abuse who presented to [facility] as a transfer from OSH with manic symptoms in context of methamphetamine use. This appears to be precipitated by methamphetamine use. Factors that seem to have predisposed him to mania include schizoaffective disorder, substance abuse and uncle with schizophrenia. This current problem is maintained by ongoing substance abuse, lack of consistent medication compliance, and poor insight. However, protective factors include good support from parents who are guardians, motivation to seek treatment, and fair insight into current situation. Proposed treatment will consist of substance detoxification and medication management.    DSM-5 DIAGNOSES:  1. Stimulant use disorder, methamphetamine type, moderate  2. Methamphetamine withdrawal, resolved  3. Schizoaffective disorder, bipolar type  4. Tobacco use disorder (chewing tobacco), severe  5. Stimulant use disorder, cocaine type, unspecified severity     PLAN     ? No indication for changes to psychotropic medications on 7/11.   ? Received Invega Sustenna 156 mg IM on 04/20/21  ? Continue Depakote ER PTA dose 2500 mg QHS for schizoaffective disorder and mood stabilization  ? Level 1 CARES assessment completed 6/8. Level 1 recommended Level 2 assessment. Level 2 scheduled for 7/11.   ? SW interventions, has placement after level 2 completion.     Discussed and seen with Dr. Sherrine Maples.        Metabolic labs completed on:  Lipid Panel:   LDL   Date Value Ref Range Status   03/19/2021 106 (H) <100 mg/dL Final     HDL   Date Value Ref Range Status   03/19/2021 55 >40 MG/DL Final     VLDL   Date Value Ref Range Status   03/19/2021 15 MG/DL Final     Cholesterol   Date Value Ref Range Status   03/19/2021 174 <200 MG/DL Final     Triglycerides   Date Value Ref Range Status   03/19/2021 76 <150 MG/DL Final       Blood Glucose:  Hemoglobin A1C   Date Value Ref Range Status   03/19/2021 5.3 4.0 - 6.0 % Final     Comment:     The ADA recommends that most patients with type 1 and type 2 diabetes maintain   an A1c level <7%.       ______________________________________________________________     SUBJECTIVE     Mr. Jamie [name] interviewed at bedside this morning. Reports he is excited for level 2 assessment today to get closer to his placement. Mood today is hopeful, denies depression/anxiety. Denies SI/HI/AVH. Reports good sleep/appetite.      REVIEW OF SYSTEMS   Review of Systems   Respiratory: Negative for cough and shortness of breath.    Cardiovascular: Negative for chest pain.   Gastrointestinal: Negative for constipation, diarrhea, nausea and vomiting.   Genitourinary: Negative for difficulty urinating.   Musculoskeletal: Negative for arthralgias.   Skin: Negative for color change.   Neurological: Negative for headaches.   Psychiatric/Behavioral: Negative for hallucinations and suicidal ideas.  The patient is not nervous/anxious.         OBJECTIVE                    Vital Signs:  Current                Vital Signs: 24 Hour Range   BP: 118/59 (07/11 0712)  Temp: 36.5 ?C (97.7 ?F) (07/11 1610)  Pulse: 60 (07/11 0712)  Respirations: 18 PER MINUTE (07/11 0712)  SpO2: 97 % (07/11 0712) BP: (118-124)/(59-62)   Temp:  [36.5 ?C (97.7 ?F)-37.1 ?C (98.8 ?F)]   Pulse:  [60-96]   Respirations:  [18 PER MINUTE]   SpO2:  [96 %-97 %]    Intensity Pain Scale (Self Report): (not recorded)      Sleep:  Hours of Sleep: 7.25   Quality of Sleep: Resting Quietly    Scheduled Medications:  divalproex (DEPAKOTE ER) ER tablet 2,500 mg, 2,500 mg, Oral, QHS  nicotine (NICODERM CQ STEP 2) 14 mg/day patch 1 patch, 1 patch, Transdermal, QDAY  paliperidone palmitate (INVEGA) (+) injection 156 mg, 156 mg, Intramuscular, Q28 Days (4 weeks)        PRN Medications:  acetaminophen Q6H PRN, calcium carbonate TID PRN, nicotine polacrilex Q2H PRN 4 mg at 05/10/21 0814, OLANZapine Q6H PRN **OR** OLANZapine Q6H PRN, polyethylene glycol 3350 QDAY PRN, traZODone QHS PRN 50 mg at 05/03/21 2148    Mental Status Exam:  ? General/Constitutional: 25 y.o. adult, appears stated age, dressed in hospital attire, fair hygiene   ? Behavior: calm, cooperative, pleasant  ? Speech/Motor: rrr with good articulation  ? Eye Contact: good  ? Mood: hopeful  ? Affect: full, mood congruent  ? Thought Process: linear and goal directed  ? Associations: Intact  ? Thought Content: Denies SI/HI. Shows no evidence of delusions or paranoia   ? Perception: Denies AVH. + visual disturbances of floaters. Does not appear to be responding to internal stimuli   ? Insight/Judgment: fair/fair    Focused Physical Exam:  ? Gait: no ataxia, normal arm sway   ? Neurological: no tic or tremor    ______________________________________________________________  Raquel Shenee Wignall, MD

## 2021-05-10 NOTE — Care Plan
Problem: Social Interaction - Impaired  Goal: Increased Social Interaction  Outcome: Goal Ongoing

## 2021-05-10 NOTE — Progress Notes
Pharmacy Nicotine Replacement Therapy  Pharmacy was consulted to assist with management of nicotine replacement therapy (NRT).    Jamie [name] is a 25 y.o.adult who reports the following home nicotine usage:  Social History     Tobacco Use   Smoking Status Never Smoker   Smokeless Tobacco Current User      Plan:  Nicotine Replacement Therapy Active IP Orders (From admission, onward)     Start     Ordered Stop    05/10/21 0915  nicotine (NICODERM CQ STEP 2) 14 mg/day patch 1 patch  1 patch,   Transdermal,   DAILY        Route Transdermal  Dose 1 patch  Start 05/10/21 0915  End Until Discontinued  Last Admin --     05/10/21 0802 --    03/17/21 1641  nicotine polacrilex (NICORETTE) gum 4 mg  4 mg,   Buccal,   EVERY  2 HOURS PRN        Route Buccal  Dose 4 mg  Start 03/17/21 1641  End Until Discontinued  Last Admin 05/09/21 2125     03/17/21 1641 --              Since patient was receiving the 21 mg patch for over 6 weeks, we will decrease to the 14 mg patch per smoking cessation NRT recommendations.  We will continue to monitor increased use of nicotine gum prn.     Pharmacy will continue to monitor [name] for signs and symptoms of nicotine withdrawal or excess and adjust nicotine replacement therapy accordingly.      Pat Kocher, [facility]  05/10/2021

## 2021-05-10 NOTE — Behavioral Health Treatment Team
TREATMENT TEAM NOTE  Name: Jamie [name]        MRN: [id number]          DOB: July 24, 1996            Age: 25 y.o.  Admission Date: 03/16/2021       LOS: 54 days    Date of Service: 05/10/2021        Attendees:  UR: Ival Bible, LMSW  Nursing: Romilda Joy, BSN, RN  Therapy: Marijean Heath, LMLP, LCP  CM: Neysa Bonito, [facility]  CM: Eden Lathe, [facility]  Activities Coordinator: Philippa Chester  Pharmacy: Blinda Leatherwood, PharmD  Psychiatry: Shella Maxim. Sherrine Maples, DO  Psychiatry Resident: Chauncy Passy, DO  Psychiatry Resident: Doylene Bode, MD      Significant Points Discussed: Meeting with a placement today at 1pm.     Treatment Plan Discussion: No updates

## 2021-05-10 NOTE — Progress Notes
Pt was observed with congruent affect. Pt appeared to have WNL thought.  Pt was  oriented to person, place, time, situation. Pt did not appear to be responding to internal stimuli.  Pt did not appear to be making delusional statements.       Pt appeared to be calm and cooperative. Pt was observed spending most of the evening shift in the day room watching television and playing solitaire.  Pt appeared to be in a good mood. Pt was observed interacting with patients and staff and appeared to be interacting appropriately.     Depression/Anxiety: Pt rated depression as being 0/10 and anxiety being 0/10.     Suicidal Ideation/Homicidal ideation: Pt  denies suicidal ideation. Pt denies homicidal ideations. Pt reports  being able to contract for safety.     Audio/Visual Hallucinations:  Pt reports audio/ visual hallucinations.   Pt reported having visual hallucinations of, 'colors and the usual stuff I always see.'    Pt Vital signs were assessed:  Vitals*  Pulse: 96  BP Patient Position: Chair  BP: 124/62  Respirations: 18 PER MINUTE  O2 Device: None (Room air)  SpO2: 96 %  SpO2 Location: Finger, Second (Index)    Medical Concerns: Pt denies  having new medical concerns.     Pain: Pt  denies having pain.    Pt will continue to be monitored.

## 2021-05-10 NOTE — Progress Notes
Chart check completed

## 2021-05-10 NOTE — Care Plan
Patient is compliant with his morning medications and vitals. Pt is tolerating meals well, appetite is stable.Pt's mood and behavior are stable at this time. Pt denies pain, anxiety, depression, SI/HI/AVH at this time. No behavioral issues noted. Patient is able to ask to have needs met appropriately and patient is encouraged to come to staff with concerns. Pt remains on 15 minute checks for safety. Pt voices understanding of plan of care and need for continued treatment. Will continue monitoring pt's safety and behavior.

## 2021-05-10 NOTE — Progress Notes
[facility]  Therapy Progress Note    NAME:Jamie [name] MRN: [id number] DOB:Nov 08, 1995 AGE: 25 y.o.  ADMISSION DATE: 03/16/2021 DAYS ADMITTED: LOS: 54 days    Date of Service:  05/10/21    Active Problems:    Schizoaffective disorder, bipolar type (HCC)    Tobacco use disorder, severe, dependence    Withdrawal from methamphetamine (HCC)    Methamphetamine use disorder, moderate (HCC)    Objective:  Therapist approached Patient as he was walking back/forth in the hallway.  Eye contact WNL.  Affect full; mood congruent.    Narrative:  Therapist asked Patient about his Level II interview.  He acknowledged it was not easy to talk about how his bad choices, past substance use, impulsivity, and past medication noncompliance led to this situation.  He discussed long-term goals including living on his own at some point.  Patient was future focused, positive, and goal directed.  Therapist reinforced his dedication in getting more exercise while on the Unit.    Follow up:  Treatment Team will continue to follow.

## 2021-05-11 NOTE — Care Plan
RN Shift Assessment    Pt isolated to room for the evening. A/Ox 4. Endorses AVH of "the stuff I usually have". Denied depression/anxiety/SI/HI and pain. Compliant with medications. Will continue to monitor pt per protocol.    Mental Status Exam  Legal Status: Voluntary Admission  General Appearance: Normal  Mood / Affect: Congruent  Speech: Normal  Content Of Thought: Normal  Motor Activity: Normal  Flow of Thought: Linear  Sensorium: Orientation to person, Orientation to place, Orientation to situation, Orientation to time  Insight / Judgment: Fair judgment, Fair insight  Behavior: Calm, Cooperative  Patient Strengths: Motivation and readiness for change    Vitals*  Pulse: 92  BP Patient Position: Chair  BP: 126/67  Respirations: 18 PER MINUTE  O2 Device: None (Room air)  SpO2: 96 %  SpO2 Location: Finger, Second (Index)     PRN Medication Administered:  Trazodone 50 mg tablet  Non pharmacological interventions attempted prior to PRN medication administration:  Decrease stimuli  Brief narrative of reason for PRN medication administration:   Pt requested sleep aid     Problem: Health Maintenance - Impaired  Goal: Establish therapeutic relationships  Outcome: Goal Ongoing  Goal: Knowledge of health maintenance  Outcome: Goal Ongoing     Problem: Coping - Ineffective, Family  Goal: Effective Coping  Outcome: Goal Ongoing  Goal: Communicate with support staff  Outcome: Goal Ongoing  Goal: Knowledge of positive coping methods  Outcome: Goal Ongoing     Problem: Social Interaction - Impaired  Goal: Increased Social Interaction  Outcome: Goal Ongoing  Goal: Knowledge of social interaction  Outcome: Goal Ongoing     Problem: Discharge Planning  Goal: Participation in plan of care  Outcome: Goal Ongoing

## 2021-05-11 NOTE — Progress Notes
Psychiatry Progress Note  Name: Jamie [name]          MRN: [id number] DOB: 1996/09/19         Age: 25 y.o.  Admission Date: 03/16/2021  LOS: 55 days    Service: [facility] Adult Psych 1    Active Problems:    Schizoaffective disorder, bipolar type (HCC)    Tobacco use disorder, severe, dependence    Withdrawal from methamphetamine (HCC)    Methamphetamine use disorder, moderate (HCC)    PLAN  1. Continue current medications.      REASONS FOR CONTINUED HOSPITALIZATION  Psychiatric stabilization and medication management. Patient with decreased social support and at risk for recidivism if discharged at this time.       ______________________________________________________________  SUBJECTIVE  Patient seen for follow up. Patient reports doing well overall, is happy the level 2 assessment is complete. He denies changes in sleep, appetite, or energy. Denies SI/HI/AH/VH.     REVIEW OF SYSTEMS  Review of Systems   Constitutional: Negative for appetite change.   Respiratory: Negative for shortness of breath.    Cardiovascular: Negative for chest pain.   Gastrointestinal: Negative for nausea and vomiting.   Psychiatric/Behavioral: Negative for hallucinations and suicidal ideas.     ______________________________________________________________  OBJECTIVE                 Vital Signs:  Current                Vital Signs: 24 Hour Range   BP: 93/57 (07/12 0800)  Temp: 36.5 ?C (97.7 ?F) (07/12 0800)  Pulse: 55 (07/12 0800)  Respirations: 16 PER MINUTE (07/12 0800)  SpO2: 95 % (07/12 0800) BP: (93-126)/(57-67)   Temp:  [36.5 ?C (97.7 ?F)-36.8 ?C (98.2 ?F)]   Pulse:  [55-92]   Respirations:  [16 PER MINUTE-18 PER MINUTE]   SpO2:  [95 %-96 %]    Intensity Pain Scale (Self Report): (not recorded)      Scheduled Medications:  divalproex (DEPAKOTE ER) ER tablet 2,500 mg, 2,500 mg, Oral, QHS  nicotine (NICODERM CQ STEP 2) 14 mg/day patch 1 patch, 1 patch, Transdermal, QDAY  paliperidone palmitate (INVEGA) (+) injection 156 mg, 156 mg, Intramuscular, Q28 Days (4 weeks)        PRN Medications:  acetaminophen Q6H PRN, calcium carbonate TID PRN, nicotine polacrilex Q2H PRN 4 mg at 05/11/21 0823, OLANZapine Q6H PRN **OR** OLANZapine Q6H PRN, polyethylene glycol 3350 QDAY PRN, traZODone QHS PRN 50 mg at 05/10/21 2129    Mental Status Exam:    General/Constitutional: caucasian male  Speech: normal rate, rhythm, volume, and tone  Thought Process: linear and goal directed  Thought Content: Denies SI/HI  Perception: denies AH/VH  Associations: intact  Mood: good  Affect: euthymic  Insight/Judgement: partial/fair    Orientation: grossly intact  Recent and remote memory: intact  Attention span and concentration: appropriate  Language: fluent, English speaker  [employer] of knowledge and vocabulary: appropriate    Focused Physical Exam:  Musculoskeletal: normal gait      ______________________________________________________________  Alyce Pagan, DO

## 2021-05-11 NOTE — Behavioral Health Treatment Team
TREATMENT TEAM NOTE  Name: Osmar Howton        MRN: [id number]          DOB: July 21, 1996            Age: 25 y.o.  Admission Date: 03/16/2021       LOS: 55 days    Date of Service: 05/11/2021        Attendees:  UR: Ival Bible, LMSW  Nursing: Freddie Apley, RN, BSN  Therapy: Marijean Heath, LMLP, LCP  CM: Neysa Bonito, [facility]  CM: Eden Lathe, LPC  Rec Therapy: Clare Charon, CTRS  Pharmacy: Blinda Leatherwood, PharmD  Psychology: Assunta Gambles. Lowry Bowl, PhD  Psychology: Peter Minium, PhD  Psychiatry: Shella Maxim. Sherrine Maples, DO       Significant Points Discussed: Level 2 screening yesterday, attending few groups, denies SI,HIAVH.    Treatment Plan Discussion: Waiting for placement

## 2021-05-11 NOTE — Care Coordination-Inpatient
Care Coordination - Inpatient Note      Patient Name:   Jamie [name]                        MRN:  [id number]  Admission Date:  03/16/2021    1:26pm  Writer emailed Melton Alar with [facility] requested a time for patient to call her and ask questions about placement.    1:29pm  Writer contacted patient's mother Treylin Burtch at [phone] regarding discharge planning. Writer informed guardian Logan Bores accepted patient and the screener recommended Level II placement. Ms. Sollenberger received Administrator, Melton Alar phone number [phone] to call with any questions she has. Ms. Axel reported she was okay with patient being discharge to [facility], stated "It might be sometime this week right?" Writer explained discharge could happen by the end of this week or beginging of the follow week, either way a CM will update her on when discharge to placement is occurring. Ms. Marcucci verbalized understanding.     Libbi Towner  05/11/2021

## 2021-05-11 NOTE — Progress Notes
Chart check completed.

## 2021-05-11 NOTE — Care Plan
Patient is compliant with his morning medications and vitals. Pt is tolerating meals well, appetite is stable. Pt's mood and behavior are stable at this time. Pt denies pain, depression, anxiety, SI/HI/AVH at this time. No behavioral issues noted. Patient is able to ask to have needs met appropriately and patient is encouraged to come to staff with concerns. Pt remains on 15 minute checks for safety. Pt voices understanding of plan of care and need for continued treatment. Will continue monitoring pt's safety and behavior.

## 2021-05-12 NOTE — Progress Notes
Case Management Progress Note    NAME:Jamie [name] MRN: [id number] DOB:1995/12/10 AGE: 25 y.o.  ADMISSION DATE: 03/16/2021 DAYS ADMITTED: LOS: 56 days     Date of Service: 05/12/2021         1:09pm  CM met with patient to ask him if he has received the Covid Vaccine, informing patient that most facilities will require the vaccine for admission. Patient reported he has not but would be willing to get the "one shot one". CM updated patient's treatment team regarding vaccine status. Provider reported they will follow up with guardian on concent for the vaccination.

## 2021-05-12 NOTE — Progress Notes
Jamie [name] is seen for med pass today. He denies anxiety, depression, SI/HI. He reports that he "always" sees "floaters and stars" in his field of vision. He reports stable sleep and appetite. He is med compliant and denies side effects. No evidence of paranoia or delusions present. Jamie [name] is pleasant and voices no concerns with treatment. No agitation or behavior concerns present.

## 2021-05-12 NOTE — Care Plan
Pt noted to be out in the dayroom watching tv and socializing with peers.Bright affect,smiling. He was encouraging other pts as he walked up and down the hallway as a way of exercising.Calm,co-operative and med compliant.Denied si/hi/ah//paranoia/feeling depressed /anxious.Continues to see floats in the periphery.Pt wishes he would have been given a chance to go for alcohol rehab instead of the level 2 placement but he shruggedly stated he doesn't have a choice at this point as his parents have finalized on it.Will continue to monitor.

## 2021-05-12 NOTE — Behavioral Health Treatment Team
TREATMENT TEAM NOTE  Name: Jamie [name]        MRN: [id number]          DOB: June 05, 1996            Age: 25 y.o.  Admission Date: 03/16/2021       LOS: 56 days    Date of Service: 05/12/2021        Attendees:  UR: Ival Bible, LMSW  Nursing: Freddie Apley, RN, BSN  Therapy: Marijean Heath, LMLP, LCP  CM: Neysa Bonito, LPC  CM: Eden Lathe, [facility]  Rec Therapy: Clare Charon, CTRS  Nurse Manager: Ursula Beath, RN  Pharmacy: Blinda Leatherwood, PharmD  Psychology: Assunta Gambles. Lowry Bowl, PhD  Psychology: Peter Minium, PhD  Psychology: Cyd Silence, MS4  Psychiatry: Shella Maxim. Sherrine Maples, DO  Psychiatry: Raquel James, MD  Psychiatry Resident: Wilma Flavin, MD  Psychiatry Resident: Antony Odea, DO       Significant Points Discussed: Denies SI,HI,AVH, attending some groups.    Treatment Plan Discussion: Possible discharge this week to [facility].

## 2021-05-12 NOTE — Care Plan
Problem: Coping - Ineffective, Family  Goal: Effective Coping  Outcome: Goal Ongoing  Flowsheets (Taken 05/12/2021 1134)  Effective coping: Provide coping support     Problem: Health Maintenance - Impaired  Goal: Establish therapeutic relationships  Outcome: Goal Ongoing  Flowsheets (Taken 05/12/2021 1134)  Establish therapeutic relationships:   Assist patient in setting realistic expectations   Encourage expressions of feelings     Problem: Social Interaction - Impaired  Goal: Increased Social Interaction  Outcome: Goal Ongoing  Flowsheets (Taken 05/12/2021 1134)  Increased social interaction:   Socialization promotion   Establish therapeutic relationships   Assess impaired social interaction signs and symptoms     Problem: Discharge Planning  Goal: Participation in plan of care  Outcome: Goal Ongoing  Flowsheets (Taken 05/12/2021 1134)  Participation in Plan of Care: Involve patient/caregiver in care planning decision making

## 2021-05-12 NOTE — Progress Notes
PSYCHIATRIC PROGRESS NOTE     LOS: 56 days  Voluntary/Involuntary Status: Voluntary  Guardian? Yes  Koran, Seabrook (Mother)   [phone]   Yonah, Tangeman (Father)  [phone]     MEDICATIONS   Current Psychotropic Medications:   1. [name] IM q4weeks, last dose 04/20/21, next dose ~ 05/18/21 +/- 4 day window 05/14/21-05/22/21  2. Depakote ER 2500 mg QHS     ASSESSMENT & DIAGNOSES   Jamie [name] is a 25 y.o. Caucasian adult with a history of schizoaffective disorder and polysubstance abuse who presented to [facility] as a transfer from OSH with manic symptoms in context of methamphetamine use. This appears to be precipitated by methamphetamine use. Factors that seem to have predisposed him to mania include schizoaffective disorder, substance abuse and uncle with schizophrenia. This current problem is maintained by ongoing substance abuse, lack of consistent medication compliance, and poor insight. However, protective factors include good support from parents who are guardians, motivation to seek treatment, and fair insight into current situation. Proposed treatment will consist of substance detoxification and medication management.    DSM-5 DIAGNOSES:  1. Stimulant use disorder, methamphetamine type, moderate  2. Methamphetamine withdrawal, resolved  3. Schizoaffective disorder, bipolar type  4. Tobacco use disorder (chewing tobacco), severe  5. Stimulant use disorder, cocaine type, unspecified severity     PLAN     ? No indication for changes to psychotropic medications on 7/13.   ? Received Invega Sustenna 156 mg IM on 04/20/21  ? Continue Depakote ER PTA dose 2500 mg QHS for schizoaffective disorder and mood stabilization  ? Level 1 CARES assessment completed 6/8. Level 1 recommended Level 2 assessment. Level 2 completed 7/11.   ? SW interventions, has placement after level 2 completion.     Discussed and seen with Dr. Sherrine Maples.        Metabolic labs completed on:  Lipid Panel:   LDL   Date Value Ref Range Status   03/19/2021 106 (H) <100 mg/dL Final     HDL   Date Value Ref Range Status   03/19/2021 55 >40 MG/DL Final     VLDL   Date Value Ref Range Status   03/19/2021 15 MG/DL Final     Cholesterol   Date Value Ref Range Status   03/19/2021 174 <200 MG/DL Final     Triglycerides   Date Value Ref Range Status   03/19/2021 76 <150 MG/DL Final       Blood Glucose:  Hemoglobin A1C   Date Value Ref Range Status   03/19/2021 5.3 4.0 - 6.0 % Final     Comment:     The ADA recommends that most patients with type 1 and type 2 diabetes maintain   an A1c level <7%.       ______________________________________________________________     SUBJECTIVE     Jamie [name] seen for follow up in group room today. He reports sleeping well, good appetite. Mood is good, denies depression/anxiety. Denies SI/HI/AVH. Reports he is being patient about placement, but looking forward to it still.      REVIEW OF SYSTEMS   Review of Systems   Respiratory: Negative for cough and shortness of breath.    Cardiovascular: Negative for chest pain.   Gastrointestinal: Negative for constipation, diarrhea, nausea and vomiting.   Genitourinary: Negative for difficulty urinating.   Musculoskeletal: Negative for arthralgias.   Skin: Negative for color change.   Neurological: Negative for headaches.   Psychiatric/Behavioral: Negative for hallucinations and  suicidal ideas. The patient is not nervous/anxious.         OBJECTIVE                    Vital Signs:  Current                Vital Signs: 24 Hour Range   BP: 107/67 (07/13 0718)  Temp: 36.7 ?C (98.1 ?F) (07/13 1610)  Pulse: 66 (07/13 0718)  Respirations: 18 PER MINUTE (07/13 0718)  SpO2: 97 % (07/13 0718) BP: (107-139)/(67-75)   Temp:  [36.6 ?C (97.9 ?F)-36.7 ?C (98.1 ?F)]   Pulse:  [66-75]   Respirations:  [17 PER MINUTE-18 PER MINUTE]   SpO2:  [97 %-99 %]    Intensity Pain Scale (Self Report): (not recorded)      Sleep:  Hours of Sleep: 5.25   Quality of Sleep: Resting Quietly    Scheduled Medications:  divalproex (DEPAKOTE ER) ER tablet 2,500 mg, 2,500 mg, Oral, QHS  nicotine (NICODERM CQ STEP 2) 14 mg/day patch 1 patch, 1 patch, Transdermal, QDAY  paliperidone palmitate (INVEGA) (+) injection 156 mg, 156 mg, Intramuscular, Q28 Days (4 weeks)        PRN Medications:  acetaminophen Q6H PRN, calcium carbonate TID PRN, nicotine polacrilex Q2H PRN 4 mg at 05/11/21 2058, OLANZapine Q6H PRN **OR** OLANZapine Q6H PRN, polyethylene glycol 3350 QDAY PRN, traZODone QHS PRN 50 mg at 05/10/21 2129    Mental Status Exam:  ? General/Constitutional: 25 y.o. adult, appears stated age, dressed in hospital attire, fair hygiene   ? Behavior: calm, cooperative, pleasant  ? Speech/Motor: normal rate/tone/rhythm/volume, no PMA/PMR  ? Eye Contact: good  ? Mood: good  ? Affect: full, mood congruent  ? Thought Process: linear and goal directed  ? Associations: Intact  ? Thought Content: Denies SI/HI. Shows no evidence of delusions or paranoia   ? Perception: Denies AVH. + visual disturbances of floaters. Does not appear to be responding to internal stimuli   ? Insight/Judgment: fair/fair    Focused Physical Exam:  ? Gait: no ataxia, normal arm sway   ? Neurological: no tic or tremor    ______________________________________________________________  Raquel Seyed Heffley, MD

## 2021-05-12 NOTE — Progress Notes
Chart check completed.

## 2021-05-13 MED ORDER — COVID-19 VAC,AD26(JANSSEN)-PF 0.5 ML IM SUSP
.5 mL | Freq: Once | INTRAMUSCULAR | 0 refills | Status: CP
Start: 2021-05-13 — End: ?
  Administered 2021-05-13: 19:00:00 0.5 mL via INTRAMUSCULAR

## 2021-05-13 NOTE — Care Plan
Pt presented with bright affect.He was social in the milieu as he watched tv.He denied si/hi/ah//paranoia. Continues to endorse seeing floaters and dots. Calm,co-operative and med compliant.Will continue to monitor.

## 2021-05-13 NOTE — Progress Notes
Chart check completed.

## 2021-05-13 NOTE — Care Plan
Problem: Health Maintenance - Impaired  Goal: Establish therapeutic relationships  Outcome: Goal Ongoing  Goal: Knowledge of health maintenance  Outcome: Goal Ongoing     Problem: Coping - Ineffective, Family  Goal: Effective Coping  Outcome: Goal Ongoing  Goal: Communicate with support staff  Outcome: Goal Ongoing  Goal: Knowledge of positive coping methods  Outcome: Goal Ongoing     Problem: Social Interaction - Impaired  Goal: Increased Social Interaction  Outcome: Goal Ongoing  Goal: Knowledge of social interaction  Outcome: Goal Ongoing     Problem: Discharge Planning  Goal: Participation in plan of care  Outcome: Goal Ongoing

## 2021-05-13 NOTE — Progress Notes
PSYCHIATRIC PROGRESS NOTE     LOS: 57 days  Voluntary/Involuntary Status: Voluntary  Guardian? Yes  Jamie, [name] (Mother)   [phone]   Jamie, [name] (Father)  [phone]     MEDICATIONS   Current Psychotropic Medications:   1. [name] IM q4weeks, last dose 04/20/21, next dose ~ 05/18/21 +/- 4 day window 05/14/21-05/22/21  2. Depakote ER 2500 mg QHS     ASSESSMENT & DIAGNOSES   Jamie [name] is a 25 y.o. Caucasian adult with a history of schizoaffective disorder and polysubstance abuse who presented to [facility] as a transfer from OSH with manic symptoms in context of methamphetamine use. This appears to be precipitated by methamphetamine use. Factors that seem to have predisposed him to mania include schizoaffective disorder, substance abuse and uncle with schizophrenia. This current problem is maintained by ongoing substance abuse, lack of consistent medication compliance, and poor insight. However, protective factors include good support from parents who are guardians, motivation to seek treatment, and fair insight into current situation. Proposed treatment will consist of substance detoxification and medication management.    DSM-5 DIAGNOSES:  1. Stimulant use disorder, methamphetamine type, moderate  2. Methamphetamine withdrawal, resolved  3. Schizoaffective disorder, bipolar type  4. Tobacco use disorder (chewing tobacco), severe  5. Stimulant use disorder, cocaine type, unspecified severity     PLAN     ? No indication for changes to psychotropic medications on 7/14.   ? Received Invega Sustenna 156 mg IM on 04/20/21  ? Continue Depakote ER PTA dose 2500 mg QHS for schizoaffective disorder and mood stabilization  ? Level 1 CARES assessment completed 6/8. Level 1 recommended Level 2 assessment. Level 2 completed 7/11.   ? SW interventions, has placement after level 2 completion.   ? Spoke with patient and guardian about vaccination status for placement, with options for vaccination vs isolation upon arrival. Guardian/patient consented to patient being vaccinated prior to placement.     Discussed and seen with Dr. Sherrine Maples.        Metabolic labs completed on:  Lipid Panel:   LDL   Date Value Ref Range Status   03/19/2021 106 (H) <100 mg/dL Final     HDL   Date Value Ref Range Status   03/19/2021 55 >40 MG/DL Final     VLDL   Date Value Ref Range Status   03/19/2021 15 MG/DL Final     Cholesterol   Date Value Ref Range Status   03/19/2021 174 <200 MG/DL Final     Triglycerides   Date Value Ref Range Status   03/19/2021 76 <150 MG/DL Final       Blood Glucose:  Hemoglobin A1C   Date Value Ref Range Status   03/19/2021 5.3 4.0 - 6.0 % Final     Comment:     The ADA recommends that most patients with type 1 and type 2 diabetes maintain   an A1c level <7%.       ______________________________________________________________     SUBJECTIVE     Jamie. [name] seen for follow up in his room today. He reports he slept well. Mood is good today, denies SI/HI/AVH. Denies any issues with appetite. No concerns today, continues to look forward to placement.      REVIEW OF SYSTEMS   Review of Systems   Respiratory: Negative for cough and shortness of breath.    Cardiovascular: Negative for chest pain.   Gastrointestinal: Negative for constipation, diarrhea, nausea and vomiting.   Genitourinary: Negative  for difficulty urinating.   Musculoskeletal: Negative for arthralgias.   Skin: Negative for color change.   Neurological: Negative for headaches.   Psychiatric/Behavioral: Negative for hallucinations and suicidal ideas. The patient is not nervous/anxious.         OBJECTIVE                    Vital Signs:  Current                Vital Signs: 24 Hour Range   BP: 105/52 (07/14 0700)  Temp: 36.4 ?C (97.5 ?F) (07/14 0700)  Pulse: 66 (07/14 0700)  Respirations: 18 PER MINUTE (07/14 0700)  SpO2: 97 % (07/14 0700)  O2 Device: None (Room air) (07/13 1919) BP: (105-135)/(52-75)   Temp:  [36.4 ?C (97.5 ?F)-37.3 ?C (99.1 ?F)]   Pulse: [65-66]   Respirations:  [18 PER MINUTE]   SpO2:  [97 %-98 %]   O2 Device: None (Room air)   Intensity Pain Scale (Self Report): (not recorded)      Sleep:  Hours of Sleep: 6.25   Quality of Sleep: Resting Quietly    Scheduled Medications:  divalproex (DEPAKOTE ER) ER tablet 2,500 mg, 2,500 mg, Oral, QHS  nicotine (NICODERM CQ STEP 2) 14 mg/day patch 1 patch, 1 patch, Transdermal, QDAY  paliperidone palmitate (INVEGA) (+) injection 156 mg, 156 mg, Intramuscular, Q28 Days (4 weeks)        PRN Medications:  acetaminophen Q6H PRN, calcium carbonate TID PRN, nicotine polacrilex Q2H PRN 4 mg at 05/12/21 2011, OLANZapine Q6H PRN **OR** OLANZapine Q6H PRN, polyethylene glycol 3350 QDAY PRN, traZODone QHS PRN 50 mg at 05/10/21 2129    Mental Status Exam:  ? General/Constitutional: 25 y.o. adult, appears stated age, dressed in hospital attire, fair hygiene   ? Behavior: calm, cooperative, pleasant  ? Speech/Motor: normal rate/tone/rhythm/volume, no PMA/PMR  ? Eye Contact: good  ? Mood: good  ? Affect: full, mood congruent  ? Thought Process: linear and goal directed  ? Associations: Intact  ? Thought Content: Denies SI/HI. Shows no evidence of delusions or paranoia   ? Perception: Denies AVH. + visual disturbances of floaters. Does not appear to be responding to internal stimuli   ? Insight/Judgment: fair/fair    Focused Physical Exam:  ? Gait: no ataxia, normal arm sway   ? Neurological: no tic or tremor    ______________________________________________________________  Raquel Annamae Shivley, MD

## 2021-05-14 NOTE — Progress Notes
Chart check completed.

## 2021-05-14 NOTE — Behavioral Health Treatment Team
TREATMENT TEAM NOTE  Name: Jamie [name]          MRN: [id number]              DOB: 01-29-1996          Age: 25 y.o.  Admission Date: 03/16/2021                 Attendees:   UR: Ival Bible, LMSW  Nursing: Annamaria Boots, RN  Therapy: Clement Husbands, [place], [facility]  Therapy: Marijean Heath, LMLP, LCP  CM: Eden Lathe, LPC  Art Therapy: Judi Cong, Lorrin Jackson, [facility]  Pharmacy: Blinda Leatherwood, PharmD  Psychiatry: Shella Maxim. Sherrine Maples, DO    Significant Points Discussed: Calm and cooperative, denies all psych, agreed to vaccine    Potential Discharge Date: Waiting on placement

## 2021-05-14 NOTE — Care Plan
Pt presented with bright affect.He was out in the milieu socializing.Calm,co-operative and med compliant.Continues to see floaters; something he has been experiencing for a long time.Denied si/hi/ah/vh/paranoia.Will continue to monitor.

## 2021-05-14 NOTE — Progress Notes
PSYCHIATRIC PROGRESS NOTE     LOS: 58 days  Voluntary/Involuntary Status: Voluntary  Guardian? Yes  Jamie, [name] (Mother)   [phone]   Jamie, [name] (Father)  [phone]     MEDICATIONS   Current Psychotropic Medications:   1. [name] IM q4weeks, last dose 04/20/21, next dose ~ 05/18/21 +/- 4 day window 05/14/21-05/22/21  2. Depakote ER 2500 mg QHS     ASSESSMENT & DIAGNOSES   Jamie [name] is a 25 y.o. Caucasian adult with a history of schizoaffective disorder and polysubstance abuse who presented to [facility] as a transfer from OSH with manic symptoms in context of methamphetamine use. This appears to be precipitated by methamphetamine use. Factors that seem to have predisposed him to mania include schizoaffective disorder, substance abuse and uncle with schizophrenia. This current problem is maintained by ongoing substance abuse, lack of consistent medication compliance, and poor insight. However, protective factors include good support from parents who are guardians, motivation to seek treatment, and fair insight into current situation. Proposed treatment will consist of substance detoxification and medication management.    DSM-5 DIAGNOSES:  1. Stimulant use disorder, methamphetamine type, moderate  2. Methamphetamine withdrawal, resolved  3. Schizoaffective disorder, bipolar type  4. Tobacco use disorder (chewing tobacco), severe  5. Stimulant use disorder, cocaine type, unspecified severity     PLAN     ? No indication for changes to psychotropic medications on 7/15.   ? Received Invega Sustenna 156 mg IM on 04/20/21  ? Continue Depakote ER PTA dose 2500 mg QHS for schizoaffective disorder and mood stabilization  ? Level 1 CARES assessment completed 6/8. Level 1 recommended Level 2 assessment. Level 2 completed 7/11.   ? SW interventions, has placement after level 2 completion.   ? Spoke with patient and guardian about vaccination status for placement, with options for vaccination vs isolation upon arrival. Guardian/patient consented to patient being vaccinated prior to placement.     Discussed and seen with Dr. Sherrine Maples.        Metabolic labs completed on:  Lipid Panel:   LDL   Date Value Ref Range Status   03/19/2021 106 (H) <100 mg/dL Final     HDL   Date Value Ref Range Status   03/19/2021 55 >40 MG/DL Final     VLDL   Date Value Ref Range Status   03/19/2021 15 MG/DL Final     Cholesterol   Date Value Ref Range Status   03/19/2021 174 <200 MG/DL Final     Triglycerides   Date Value Ref Range Status   03/19/2021 76 <150 MG/DL Final       Blood Glucose:  Hemoglobin A1C   Date Value Ref Range Status   03/19/2021 5.3 4.0 - 6.0 % Final     Comment:     The ADA recommends that most patients with type 1 and type 2 diabetes maintain   an A1c level <7%.       ______________________________________________________________     SUBJECTIVE     Mr. Jamie [name] seen for follow up in his room today. He reports good sleep/appetite this morning. Mood is fine today, denies SI/HI/AVH. Feels physically well after receiving COVID vaccination yesterday.     REVIEW OF SYSTEMS   Review of Systems   Respiratory: Negative for cough and shortness of breath.    Cardiovascular: Negative for chest pain.   Gastrointestinal: Negative for constipation, diarrhea, nausea and vomiting.   Genitourinary: Negative for difficulty urinating.   Musculoskeletal:  Negative for arthralgias.   Skin: Negative for color change.   Neurological: Negative for headaches.   Psychiatric/Behavioral: Negative for hallucinations and suicidal ideas. The patient is not nervous/anxious.         OBJECTIVE                    Vital Signs:  Current                Vital Signs: 24 Hour Range   BP: 104/62 (07/15 0700)  Temp: 36.3 ?C (97.4 ?F) (07/15 0700)  Pulse: 85 (07/15 0700)  Respirations: 16 PER MINUTE (07/15 0700)  SpO2: 98 % (07/15 0700) BP: (104-131)/(62-71)   Temp:  [36.3 ?C (97.4 ?F)-37.1 ?C (98.7 ?F)]   Pulse:  [57-85]   Respirations:  [16 PER MINUTE]   SpO2: [98 %-99 %]    Intensity Pain Scale (Self Report): (not recorded)      Sleep:  Hours of Sleep: 6.25   Quality of Sleep: Resting Quietly    Scheduled Medications:  divalproex (DEPAKOTE ER) ER tablet 2,500 mg, 2,500 mg, Oral, QHS  nicotine (NICODERM CQ STEP 2) 14 mg/day patch 1 patch, 1 patch, Transdermal, QDAY  paliperidone palmitate (INVEGA) (+) injection 156 mg, 156 mg, Intramuscular, Q28 Days (4 weeks)        PRN Medications:  acetaminophen Q6H PRN, calcium carbonate TID PRN, nicotine polacrilex Q2H PRN 4 mg at 05/13/21 2010, OLANZapine Q6H PRN **OR** OLANZapine Q6H PRN, polyethylene glycol 3350 QDAY PRN, traZODone QHS PRN 50 mg at 05/10/21 2129    Mental Status Exam:  ? General/Constitutional: 25 y.o. adult, appears stated age, dressed in hospital attire, fair hygiene   ? Behavior: calm, cooperative, pleasant  ? Speech/Motor: normal rate/tone/rhythm/volume, no PMA/PMR  ? Eye Contact: good  ? Mood: fine  ? Affect: full, mood congruent  ? Thought Process: linear and goal directed  ? Associations: Intact  ? Thought Content: Denies SI/HI. Shows no evidence of delusions or paranoia   ? Perception: Denies AVH. + visual disturbances of floaters. Does not appear to be responding to internal stimuli   ? Insight/Judgment: fair/fair    Focused Physical Exam:  ? Gait: no ataxia, normal arm sway   ? Neurological: no tic or tremor    ______________________________________________________________  Raquel Navina Wohlers, MD

## 2021-05-15 NOTE — Progress Notes
Patient calm and cooperative. Patient denies SI, HI, AVH, and pain. Patient compliant with scheduled evening medications. Will continue to monitor and observe

## 2021-05-16 NOTE — Progress Notes
Chart check completed.

## 2021-05-16 NOTE — Care Plan
Patient resting in bed after breakfast. He is cooperative with care. He denied SI/HI, AH but endorses VH of floaters.         Problem: Health Maintenance - Impaired  Goal: Establish therapeutic relationships  Outcome: Goal Ongoing     Problem: Coping - Ineffective, Family  Goal: Effective Coping  Outcome: Goal Ongoing     Problem: Social Interaction - Impaired  Goal: Increased Social Interaction  Outcome: Goal Ongoing     Problem: Discharge Planning  Goal: Participation in plan of care  Outcome: Goal Ongoing

## 2021-05-16 NOTE — Progress Notes
Calm and cooperative this evening. Affect a bit bright, out in the milieu interacting with peers, no behavioral issues thus far. Denies si/hi/avh and pain. Med and snack complaint. Resting comfortably at this time. Will  continue to monitor pt for safety

## 2021-05-16 NOTE — Progress Notes
PSYCHIATRIC PROGRESS NOTE     LOS: 59 days  Voluntary/Involuntary Status: Voluntary  Guardian? Yes  Jamie [name], Jamie [name] (Mother)   [phone]   Jamie [name], Jamie [name] (Father)  [phone]     MEDICATIONS   Current Psychotropic Medications:   1. Jamie [name] Sustenna IM q4weeks, last dose 04/20/21, next dose ~ 05/18/21 +/- 4 day window 05/14/21-05/22/21  2. Depakote ER 2500 mg QHS     ASSESSMENT & DIAGNOSES   Jamie [name] is a 25 y.o. Caucasian adult with a history of schizoaffective disorder and polysubstance abuse who presented to [facility] as a transfer from OSH with manic symptoms in context of methamphetamine use. This appears to be precipitated by methamphetamine use. Factors that seem to have predisposed him to mania include schizoaffective disorder, substance abuse and uncle with schizophrenia. This current problem is maintained by ongoing substance abuse, lack of consistent medication compliance, and poor insight. However, protective factors include good support from parents who are guardians, motivation to seek treatment, and fair insight into current situation. Proposed treatment will consist of substance detoxification and medication management.    DSM-5 DIAGNOSES:  1. Stimulant use disorder, methamphetamine type, moderate  2. Methamphetamine withdrawal, resolved  3. Schizoaffective disorder, bipolar type  4. Tobacco use disorder (chewing tobacco), severe  5. Stimulant use disorder, cocaine type, unspecified severity     PLAN     ? No indication for changes to psychotropic medications on 7/15.   ? Received Invega Sustenna 156 mg IM on 04/20/21  ? Level 1 CARES assessment completed 6/8. Level 1 recommended Level 2 assessment. Level 2 completed 7/11.   ? SW interventions, has placement after level 2 completion.   ? Spoke with patient and guardian about vaccination status for placement, with options for vaccination vs isolation upon arrival. Guardian/patient consented to patient being vaccinated prior to placement. Discussed and seen with Dr. Catha [name].        Metabolic labs completed on:  Lipid Panel:   LDL   Date Value Ref Range Status   03/19/2021 106 (H) <100 mg/dL Final     HDL   Date Value Ref Range Status   03/19/2021 55 >40 MG/DL Final     VLDL   Date Value Ref Range Status   03/19/2021 15 MG/DL Final     Cholesterol   Date Value Ref Range Status   03/19/2021 174 <200 MG/DL Final     Triglycerides   Date Value Ref Range Status   03/19/2021 76 <150 MG/DL Final       Blood Glucose:  Hemoglobin A1C   Date Value Ref Range Status   03/19/2021 5.3 4.0 - 6.0 % Final     Comment:     The ADA recommends that most patients with type 1 and type 2 diabetes maintain   an A1c level <7%.       ______________________________________________________________     SUBJECTIVE     Jamie [name] is a 25 yo Caucasian male seen in the milieu for follow-up today per patient request. Patient presented as cooperative during the interview. Patient had no complaints. Patient describes mood as good with affect congruent with mood.Patient endorsed having a good appetite. Patient states he slept well. Patient reports energy as good. Patient denies adverse effects to medications. Patient denies SI/HI/AVH. Patient describes what may be a pathologic ophthalmologic process where he persistently sees floaters that change color, exacerbated when in the presence of increased light intensity.       REVIEW OF SYSTEMS  Review of Systems   HENT: Negative for hearing loss.    Eyes:        Reports seeing floaters that change color   Respiratory: Negative for shortness of breath.    Cardiovascular: Negative for chest pain.   Gastrointestinal: Negative for abdominal pain.   Genitourinary: Negative for frequency.   Musculoskeletal: Negative for myalgias.   Neurological: Negative for sensory change and headaches.   Psychiatric/Behavioral: Negative for depression and suicidal ideas.        OBJECTIVE                    Vital Signs:  Current Vital Signs: 24 Hour Range   BP: 136/71 (07/16 1900)  Temp: 36.3 ?C (97.4 ?F) (07/16 1900)  Pulse: 89 (07/16 1900)  Respirations: 16 PER MINUTE (07/16 1900)  SpO2: 99 % (07/16 1900) BP: (100-136)/(62-71)   Temp:  [36.3 ?C (97.4 ?F)-36.4 ?C (97.6 ?F)]   Pulse:  [74-89]   Respirations:  [16 PER MINUTE]   SpO2:  [98 %-99 %]    Intensity Pain Scale (Self Report): (not recorded)      Sleep:  Hours of Sleep: 4.25   Quality of Sleep: Restless    Scheduled Medications:  divalproex (DEPAKOTE ER) ER tablet 2,500 mg, 2,500 mg, Oral, QHS  nicotine (NICODERM CQ STEP 2) 14 mg/day patch 1 patch, 1 patch, Transdermal, QDAY  paliperidone palmitate (INVEGA) (+) injection 156 mg, 156 mg, Intramuscular, Q28 Days (4 weeks)        PRN Medications:  acetaminophen Q6H PRN, calcium carbonate TID PRN, nicotine polacrilex Q2H PRN 4 mg at 05/15/21 1822, OLANZapine Q6H PRN **OR** OLANZapine Q6H PRN, polyethylene glycol 3350 QDAY PRN, traZODone QHS PRN 50 mg at 05/10/21 2129    Mental Status Exam:  ? General/Constitutional: 25 y.o. adult, appears stated age, dressed in hospital attire, fair hygiene   ? Behavior: Calm, cooperative, pleasant  ? Speech/Motor: Normal rate/tone/rhythm/volume, no PMA/PMR  ? Eye Contact: Good  ? Mood: Good  ? Affect: Full range, mood congruent  ? Thought Process: Linear, logical, and goal-directed  ? Associations: Intact  ? Thought Content: Denies SI/HI. Shows no evidence of delusions or paranoia   ? Perception: Denies AVH. + visual disturbances of floaters. Does not appear to be responding to internal stimuli   ? Insight/Judgment: Fair/Fair    Focused Physical Exam:  ? Gait: no ataxia, normal arm sway   ? Neurological: no tic or tremor    ______________________________________________________________  Jamie Passy, DO

## 2021-05-16 NOTE — Progress Notes
PSYCHIATRIC PROGRESS NOTE     LOS: 60 days  Voluntary/Involuntary Status: Voluntary  Guardian? Yes  Burnie, Poulter (Mother)   [phone]   Brettley, Nieves (Father)  [phone]     MEDICATIONS   Current Psychotropic Medications:   1. [name] IM q4weeks, last dose 04/20/21, next dose ~ 05/18/21 +/- 4 day window 05/14/21-05/22/21  2. Depakote ER 2500 mg QHS     ASSESSMENT & DIAGNOSES   Jamie [name] is a 25 y.o. Caucasian adult with a history of schizoaffective disorder and polysubstance abuse who presented to [facility] as a transfer from OSH with manic symptoms in context of methamphetamine use. This appears to be precipitated by methamphetamine use. Factors that seem to have predisposed him to mania include schizoaffective disorder, substance abuse and uncle with schizophrenia. This current problem is maintained by ongoing substance abuse, lack of consistent medication compliance, and poor insight. However, protective factors include good support from parents who are guardians, motivation to seek treatment, and fair insight into current situation. Proposed treatment will consist of substance detoxification and medication management.    DSM-5 DIAGNOSES:  1. Stimulant use disorder, methamphetamine type, moderate  2. Methamphetamine withdrawal, resolved  3. Schizoaffective disorder, bipolar type  4. Tobacco use disorder (chewing tobacco), severe  5. Stimulant use disorder, cocaine type, unspecified severity     PLAN     ? No indication for changes to psychotropic medications on 05/16/21.   ? Received Invega Sustenna 156 mg IM on 04/20/21  ? Level 1 CARES assessment completed 6/8. Level 1 recommended Level 2 assessment. Level 2 completed 7/11.   ? SW interventions, has placement after level 2 completion.   ? Spoke with patient and guardian about vaccination status for placement, with options for vaccination vs isolation upon arrival. Guardian/patient consented to patient being vaccinated prior to placement. Discussed with Dr. Catha Nottingham.        Metabolic labs completed on:  Lipid Panel:   LDL   Date Value Ref Range Status   03/19/2021 106 (H) <100 mg/dL Final     HDL   Date Value Ref Range Status   03/19/2021 55 >40 MG/DL Final     VLDL   Date Value Ref Range Status   03/19/2021 15 MG/DL Final     Cholesterol   Date Value Ref Range Status   03/19/2021 174 <200 MG/DL Final     Triglycerides   Date Value Ref Range Status   03/19/2021 76 <150 MG/DL Final       Blood Glucose:  Hemoglobin A1C   Date Value Ref Range Status   03/19/2021 5.3 4.0 - 6.0 % Final     Comment:     The ADA recommends that most patients with type 1 and type 2 diabetes maintain   an A1c level <7%.       ______________________________________________________________     SUBJECTIVE     Per nursing, NAEO.    Patient seen in his room this morning. Denies any issues. Sleeping and eating well, no depression, no anxiety.    Denies SI, HI, AVH.     REVIEW OF SYSTEMS     Review of Systems   HENT: Negative for hearing loss.    Eyes:        Reports seeing floaters that change color   Respiratory: Negative for shortness of breath.    Cardiovascular: Negative for chest pain.   Gastrointestinal: Negative for abdominal pain.   Genitourinary: Negative for frequency.   Musculoskeletal:  Negative for myalgias.   Neurological: Negative for sensory change and headaches.   Psychiatric/Behavioral: Negative for depression and suicidal ideas.        OBJECTIVE                    Vital Signs:  Current                Vital Signs: 24 Hour Range   BP: 103/63 (07/17 0700)  Temp: 36.8 ?C (98.2 ?F) (07/17 0700)  Pulse: 59 (07/17 0700)  Respirations: 16 PER MINUTE (07/17 0700)  SpO2: 94 % (07/17 0700) BP: (103-136)/(63-71)   Temp:  [36.3 ?C (97.4 ?F)-36.8 ?C (98.2 ?F)]   Pulse:  [59-89]   Respirations:  [16 PER MINUTE]   SpO2:  [94 %-99 %]    Intensity Pain Scale (Self Report): (not recorded)      Sleep:  Hours of Sleep: 6   Quality of Sleep: Resting Quietly    Scheduled Medications:  divalproex (DEPAKOTE ER) ER tablet 2,500 mg, 2,500 mg, Oral, QHS  nicotine (NICODERM CQ STEP 2) 14 mg/day patch 1 patch, 1 patch, Transdermal, QDAY  paliperidone palmitate (INVEGA) (+) injection 156 mg, 156 mg, Intramuscular, Q28 Days (4 weeks)        PRN Medications:  acetaminophen Q6H PRN, calcium carbonate TID PRN, nicotine polacrilex Q2H PRN 4 mg at 05/16/21 1314, OLANZapine Q6H PRN **OR** OLANZapine Q6H PRN, polyethylene glycol 3350 QDAY PRN, traZODone QHS PRN 50 mg at 05/10/21 2129    Mental Status Exam:  ? General/Constitutional: 25 y.o. adult, appears stated age, dressed in hospital attire, fair hygiene   ? Behavior: Calm, cooperative, pleasant  ? Speech/Motor: Normal rate/tone/rhythm/volume, no PMA/PMR  ? Eye Contact: Good  ? Mood: alright  ? Affect: Full range, mood congruent  ? Thought Process: Linear, logical, and goal-directed  ? Associations: Intact  ? Thought Content: Denies SI/HI. Shows no evidence of delusions or paranoia   ? Perception: Denies AVH. + visual disturbances of floaters. Does not appear to be responding to internal stimuli   ? Insight/Judgment: Fair/Fair    Focused Physical Exam:  ? Gait: no ataxia, normal arm sway   ? Neurological: no tic or tremor    ______________________________________________________________  Audrie Lia, MD

## 2021-05-17 NOTE — Progress Notes
PSYCHIATRIC PROGRESS NOTE     LOS: 61 days  Voluntary/Involuntary Status: Voluntary  Guardian? Yes  Tora, Rhatigan (Mother)   [phone]   Makoto, Forsberg (Father)  [phone]     MEDICATIONS   Current Psychotropic Medications:   1. [name] IM q4weeks, last dose 04/20/21, next dose ~ 05/18/21 +/- 4 day window 05/14/21-05/22/21  2. Depakote ER 2500 mg QHS     ASSESSMENT & DIAGNOSES   Jamie [name] is a 25 y.o. Caucasian adult with a history of schizoaffective disorder and polysubstance abuse who presented to [facility] as a transfer from OSH with manic symptoms in context of methamphetamine use. This appears to be precipitated by methamphetamine use. Factors that seem to have predisposed him to mania include schizoaffective disorder, substance abuse and uncle with schizophrenia. This current problem is maintained by ongoing substance abuse, lack of consistent medication compliance, and poor insight. However, protective factors include good support from parents who are guardians, motivation to seek treatment, and fair insight into current situation. Proposed treatment will consist of substance detoxification and medication management.    DSM-5 DIAGNOSES:  1. Stimulant use disorder, methamphetamine type, moderate  2. Methamphetamine withdrawal, resolved  3. Schizoaffective disorder, bipolar type  4. Tobacco use disorder (chewing tobacco), severe  5. Stimulant use disorder, cocaine type, unspecified severity     PLAN     ? No indication for changes to psychotropic medications on 05/17/21.   ? Received [name] 156 mg IM on 04/20/21, plan to administer next monthly dose at [facility] if prior to patient going to placement.   ? Level 1 CARES assessment completed 6/8. Level 1 recommended Level 2 assessment. Level 2 completed 7/11.   ? SW interventions, has placement after level 2 completion.     Seen and discussed with Dr. Sherrine Maples.        Metabolic labs completed on:  Lipid Panel:   LDL   Date Value Ref Range Status 03/19/2021 106 (H) <100 mg/dL Final     HDL   Date Value Ref Range Status   03/19/2021 55 >40 MG/DL Final     VLDL   Date Value Ref Range Status   03/19/2021 15 MG/DL Final     Cholesterol   Date Value Ref Range Status   03/19/2021 174 <200 MG/DL Final     Triglycerides   Date Value Ref Range Status   03/19/2021 76 <150 MG/DL Final       Blood Glucose:  Hemoglobin A1C   Date Value Ref Range Status   03/19/2021 5.3 4.0 - 6.0 % Final     Comment:     The ADA recommends that most patients with type 1 and type 2 diabetes maintain   an A1c level <7%.       ______________________________________________________________     SUBJECTIVE     Patient seen for follow up in his room. Mood is pretty good today, reports weekend went well. Denies SI/HI/AVH. Reports good sleep/appetite. No reported concerns today     REVIEW OF SYSTEMS     Review of Systems   HENT: Negative for hearing loss.    Eyes:        Reports seeing floaters that change color   Respiratory: Negative for shortness of breath.    Cardiovascular: Negative for chest pain.   Gastrointestinal: Negative for abdominal pain.   Genitourinary: Negative for frequency.   Musculoskeletal: Negative for myalgias.   Neurological: Negative for sensory change and headaches.   Psychiatric/Behavioral: Negative  for depression and suicidal ideas.        OBJECTIVE                    Vital Signs:  Current                Vital Signs: 24 Hour Range   BP: 87/43 (07/18 0715)  Temp: 36.5 ?C (97.7 ?F) (07/18 0715)  Pulse: 63 (07/18 0715)  Respirations: 16 PER MINUTE (07/18 0715)  SpO2: 95 % (07/18 0715) BP: (87-139)/(43-82)   Temp:  [36.5 ?C (97.7 ?F)-37.3 ?C (99.2 ?F)]   Pulse:  [63-72]   Respirations:  [16 PER MINUTE]   SpO2:  [95 %-98 %]    Intensity Pain Scale (Self Report): (not recorded)      Sleep:  Hours of Sleep: 5   Quality of Sleep: Resting Quietly    Scheduled Medications:  divalproex (DEPAKOTE ER) ER tablet 2,500 mg, 2,500 mg, Oral, QHS  nicotine (NICODERM CQ STEP 2) 14 mg/day patch 1 patch, 1 patch, Transdermal, QDAY  paliperidone palmitate (INVEGA) (+) injection 156 mg, 156 mg, Intramuscular, Q28 Days (4 weeks)        PRN Medications:  acetaminophen Q6H PRN, calcium carbonate TID PRN, nicotine polacrilex Q2H PRN 4 mg at 05/16/21 2048, OLANZapine Q6H PRN **OR** OLANZapine Q6H PRN, polyethylene glycol 3350 QDAY PRN, traZODone QHS PRN 50 mg at 05/10/21 2129    Mental Status Exam:  ? General/Constitutional: 25 y.o. adult, appears stated age, dressed in hospital attire, fair hygiene   ? Behavior: Calm, cooperative, pleasant  ? Speech/Motor: Normal rate/tone/rhythm/volume, no PMA/PMR  ? Eye Contact: Good  ? Mood: pretty good  ? Affect: euthymic, mood congruent  ? Thought Process: linear, goal directed  ? Associations: Intact  ? Thought Content: Denies SI/HI  ? Perception: Denies AVH. + visual disturbances of floaters. Does not appear to be responding to internal stimuli   ? Insight/Judgment: Fair/Fair    Focused Physical Exam:  ? Gait: no ataxia, normal arm sway   ? Neurological: no tic or tremor    ______________________________________________________________  Raquel Terek Bee, MD

## 2021-05-17 NOTE — Behavioral Health Treatment Team
TREATMENT TEAM NOTE  Name: Jamie [name]        MRN: [id number]          DOB: 1996/05/06            Age: 25 y.o.  Admission Date: 03/16/2021       LOS: 61 days    Date of Service: 05/17/2021        Attendees:  UR: Ival Bible, LMSW  Nursing: Romilda Joy, BSN, RN  Therapy: Clement Husbands, [place], [facility]  Therapy: Marijean Heath, LMLP, LCP  CM: Neysa Bonito, LPC  CM: Eden Lathe, LPC  Rec Therapy: Clare Charon, CTRS  Nurse Manager: Ursula Beath, RN  Pharmacy: Dolphus Jenny, PharmD  Psychology: Peter Minium, PhD  Psychiatry: Shella Maxim. Sherrine Maples, DO      Significant Points Discussed: Attended one group, out in the milieu interacting with peers, calm and cooperative.    Treatment Plan Discussion: Waiting for placement

## 2021-05-17 NOTE — Care Plan
Problem: Health Maintenance - Impaired  Goal: Establish therapeutic relationships  Outcome: Goal Ongoing  Flowsheets (Taken 05/17/2021 0959)  Establish therapeutic relationships:   Assist patient in setting realistic expectations   Encourage expressions of feelings     Problem: Coping - Ineffective, Family  Goal: Effective Coping  Outcome: Goal Ongoing  Flowsheets (Taken 05/17/2021 0959)  Effective coping: Provide coping support     Problem: Social Interaction - Impaired  Goal: Increased Social Interaction  Outcome: Goal Ongoing  Flowsheets (Taken 05/17/2021 0959)  Increased social interaction: Socialization promotion  Goal: Knowledge of social interaction  Flowsheets (Taken 05/17/2021 [phone])  Knowledge of social interaction: Provide problem-solving skills education

## 2021-05-17 NOTE — Care Plan
Patient denies pain, anxiety, depression, SI/HI/AH. Pt endorses VH.Marland Kitchen Mood and behavior have been stable. Pt has been calm and cooperative. Pt out in the milieu, watching TV, and interacting with peers. Pt compliant with HS medications and evening snack. Pt remains on 15 minute checks for safety.       Problem: Health Maintenance - Impaired  Goal: Establish therapeutic relationships  Outcome: Goal Ongoing  Goal: Knowledge of health maintenance  Outcome: Goal Ongoing     Problem: Coping - Ineffective, Family  Goal: Effective Coping  Outcome: Goal Ongoing  Goal: Communicate with support staff  Outcome: Goal Ongoing  Goal: Knowledge of positive coping methods  Outcome: Goal Ongoing     Problem: Social Interaction - Impaired  Goal: Increased Social Interaction  Outcome: Goal Ongoing  Goal: Knowledge of social interaction  Outcome: Goal Ongoing     Problem: Discharge Planning  Goal: Participation in plan of care  Outcome: Goal Ongoing

## 2021-05-17 NOTE — Progress Notes
Patient remains on 15 min checks for safety. AOx4. Reports sleeping "pretty well" and states his mood is "good". Denies anxiety or depression. Denies SI/HI. Endorses VH stating "I always see a little bit of stuff, like floaters and things". Denies AH. Patient presents with euthymic mood and congruent affect. Appearance and hygiene fair. Gait steady and no abnormal movements noted. Patient spent the morning isolated to room resting. Visible on the milieu interacting with peers in the afternoon. Encouraged group attendance and to come to staff with any concerns. Will continue to monitor for safety and behavior.

## 2021-05-17 NOTE — Progress Notes
Chart check completed.

## 2021-05-17 NOTE — Progress Notes
Psychology [phone])    Service(s): Individual Counseling and Psychotherapy    Presentation: Met with Jamie [name] for individual psychotherapy. He reported that he has begun to take responsibility for many of the choices he has made. He discussed how he now has an understanding that not taking his medications is not an option for him. We discussed some of the delusional ideas he has exhibited during previous admissions. Raji was able to discuss his prior delusions (e.g., believing his father was selling drugs) with humor and good insight. He reported that he is now simply waiting for his level 2 placement. He discussed his family dynamics and feeling inadequate relative to those in his family. We discussed his self-concept and thoughts and feelings surrounding his change in mood. He noted that he feels "more clear" than he has in very long time. He was somewhat concerned with his weight gain since admission, about which we engaged in some cognitive reframing. Overall, Laiken continues to cope well with his current circumstances is is future-oriented.     MSE: Casually dressed and groomed, A+Ox3, mood "really good," with a good range of affect, euthymic, appropriate humor,eye contact was WNL, speech WNL, TP linear and goal-directed, TC was negative forSI,HI, AVH, insightand judgement weregood, actively engaged in session.    Psychotherapy:Engaged patient in individual psychotherapy, with primary use of brief psychodynamic interventions. He was receptive to intervention.     Plan: Will continue to follow    Winifred Olive, Ph.D.   Psychology Postdoctoral Fellow  Pager [phone]  On [place]

## 2021-05-18 MED ORDER — INVEGA SUSTENNA 156 MG/ML IM SYRG
156 mg | INTRAMUSCULAR | 0 refills | Status: CN
Start: 2021-05-18 — End: ?

## 2021-05-18 MED ORDER — DIVALPROEX 500 MG PO TBEC
2500 mg | ORAL_TABLET | Freq: Every evening | ORAL | 0 refills | Status: CN
Start: 2021-05-18 — End: ?

## 2021-05-18 NOTE — Care Plan
Pt has been calm, cooperative and compliant. Denies all psych. Mood and affect are bright. Med compliant. No behavioral issues, will continue to monitor....      Problem: Health Maintenance - Impaired  Goal: Establish therapeutic relationships  Outcome: Goal Ongoing  Goal: Knowledge of health maintenance  Outcome: Goal Ongoing     Problem: Coping - Ineffective, Family  Goal: Effective Coping  Outcome: Goal Ongoing  Goal: Communicate with support staff  Outcome: Goal Ongoing  Goal: Knowledge of positive coping methods  Outcome: Goal Ongoing     Problem: Social Interaction - Impaired  Goal: Increased Social Interaction  Outcome: Goal Ongoing  Goal: Knowledge of social interaction  Outcome: Goal Ongoing     Problem: Discharge Planning  Goal: Participation in plan of care  Outcome: Goal Ongoing

## 2021-05-18 NOTE — Progress Notes
Chart check completed.

## 2021-05-18 NOTE — Progress Notes
Psychiatry Progress Note  Name: Jamie [name]          MRN: [id number] DOB: [birth year]         Age: 25 y.o.  Admission Date: 03/16/2021  LOS: 62 days    Service: [facility] 1    Active Problems:    Schizoaffective disorder, bipolar type (HCC)    Tobacco use disorder, severe, dependence    Withdrawal from methamphetamine (HCC)    Methamphetamine use disorder, moderate (HCC)    PLAN  1. Continue current medications.      REASONS FOR CONTINUED HOSPITALIZATION  Psychiatric stabilization and medication management. Patient with decreased social support and at risk for recidivism if discharged at this time.       ______________________________________________________________  SUBJECTIVE  Patient seen for follow up. No psychiatric concerns at this time, eating and sleeping well. Denies SI/HI/AH/VH.    REVIEW OF SYSTEMS  Review of Systems   Constitutional: Negative for appetite change.   Respiratory: Negative for shortness of breath.    Cardiovascular: Negative for chest pain.   Gastrointestinal: Negative for nausea and vomiting.   Psychiatric/Behavioral: Negative for hallucinations and suicidal ideas.     ______________________________________________________________  OBJECTIVE                 Vital Signs:  Current                Vital Signs: 24 Hour Range   BP: 105/55 (07/19 0700)  Temp: 36.8 ?C (98.2 ?F) (07/19 0700)  Pulse: 79 (07/19 0700)  Respirations: 16 PER MINUTE (07/19 0700)  SpO2: 97 % (07/19 0700) BP: (105-130)/(55-59)   Temp:  [36.8 ?C (98.2 ?F)-36.9 ?C (98.4 ?F)]   Pulse:  [77-79]   Respirations:  [16 PER MINUTE]   SpO2:  [97 %]    Intensity Pain Scale (Self Report): (not recorded)      Scheduled Medications:  divalproex (DEPAKOTE ER) ER tablet 2,500 mg, 2,500 mg, Oral, QHS  nicotine (NICODERM CQ STEP 2) 14 mg/day patch 1 patch, 1 patch, Transdermal, QDAY  paliperidone palmitate (INVEGA) (+) injection 156 mg, 156 mg, Intramuscular, Q28 Days (4 weeks)        PRN Medications:  acetaminophen Q6H PRN, calcium carbonate TID PRN, nicotine polacrilex Q2H PRN 4 mg at 05/17/21 1513, OLANZapine Q6H PRN **OR** OLANZapine Q6H PRN, polyethylene glycol 3350 QDAY PRN, traZODone QHS PRN 50 mg at 05/10/21 2129    Mental Status Exam:    General/Constitutional: caucasian male  Speech: normal rate, rhythm, volume, and tone  Thought Process: linear and goal directed  Thought Content: Denies SI/HI  Perception: denies AH/VH  Associations: intact  Mood: good  Affect: euthymic  Insight/Judgement: partial/fair    Orientation: grossly intact  Recent and remote memory: intact  Attention span and concentration: appropriate  Language: fluent, English speaker  [employer] of knowledge and vocabulary: appropriate    Focused Physical Exam:  Musculoskeletal: normal gait      ______________________________________________________________  Alyce Pagan, DO

## 2021-05-18 NOTE — Care Plan
Pt meeting with treatment team daily.  Patient denies pain, SI/HI, endorses some AVH.   Mood and behavior have been stable. Pt compliant with HS medication. Pt out in the milieu, watched tv, and interacting with peers.  Pt has been calm and cooperative.  Discharge planning ongoing.        Problem: Health Maintenance - Impaired  Goal: Establish therapeutic relationships  Outcome: Goal Ongoing  Goal: Knowledge of health maintenance  Outcome: Goal Ongoing     Problem: Coping - Ineffective, Family  Goal: Effective Coping  Outcome: Goal Ongoing  Goal: Communicate with support staff  Outcome: Goal Ongoing  Goal: Knowledge of positive coping methods  Outcome: Goal Ongoing     Problem: Social Interaction - Impaired  Goal: Increased Social Interaction  Outcome: Goal Ongoing  Goal: Knowledge of social interaction  Outcome: Goal Ongoing     Problem: Discharge Planning  Goal: Participation in plan of care  Outcome: Goal Ongoing

## 2021-05-18 NOTE — Behavioral Health Treatment Team
TREATMENT TEAM NOTE  Name: Jamie [name]          MRN: [id number]              DOB: 11-16-1995          Age: 25 y.o.  Admission Date: 03/16/2021                 Attendees:   UR: Ival Bible, LMSW  Nursing: Freddie Apley, RN, BSN  Therapy: Marijean Heath, LMLP, LCP  CM: Henderson Cloud, LMSW  CM: Neysa Bonito, LPC  CM: Eden Lathe, LPC  Art Therapy: Judi Cong, Lorrin Jackson, [place]  Pharmacy: Dolphus Jenny, PharmD  Psychology: Dr Lowry Bowl, PhD  Psychiatry:  Dr Sherrine Maples, DO    Significant Points Discussed: Denies all psych, second Invega injection scheduled for today    Potential Discharge Date: Awaiting placement

## 2021-05-19 NOTE — Progress Notes
PSYCHIATRIC PROGRESS NOTE     LOS: 63 days  Voluntary/Involuntary Status: Voluntary  Guardian? Yes  [name], [name] (Mother)   [phone]   [name], [name] (Father)  [phone]     MEDICATIONS   Current Psychotropic Medications:   1. [name] IM q4weeks, last dose 05/18/21  2. Depakote ER 2500 mg QHS for mood stability     ASSESSMENT & DIAGNOSES   Jamie [name] is a 25 y.o. Caucasian adult with a history of schizoaffective disorder and polysubstance abuse who presented to [facility] as a transfer from [facility] with manic symptoms in context of methamphetamine use. This appears to be precipitated by methamphetamine use. Factors that seem to have predisposed him to mania include schizoaffective disorder, substance abuse and uncle with schizophrenia. This current problem is maintained by ongoing substance abuse, lack of consistent medication compliance, and poor insight. However, protective factors include good support from parents who are guardians, motivation to seek treatment, and fair insight into current situation. Proposed treatment will consist of substance detoxification and medication management.    DSM-5 DIAGNOSES:  1. Stimulant use disorder, methamphetamine type, moderate  2. Methamphetamine withdrawal, resolved  3. Schizoaffective disorder, bipolar type  4. Tobacco use disorder (chewing tobacco), severe  5. Stimulant use disorder, cocaine type, unspecified severity     PLAN     ? No indication for changes to psychotropic medications on 05/19/21.   ? Received Invega Sustenna 156 mg IM on 05/18/21.    ? Level 1 CARES assessment completed 6/8. Level 1 recommended Level 2 assessment. Level 2 completed 7/11.   ? SW interventions, has placement after level 2 completion.     Seen and discussed with Dr. Sherrine Maples.        Metabolic labs completed on:  Lipid Panel:   LDL   Date Value Ref Range Status   03/19/2021 106 (H) <100 mg/dL Final     HDL   Date Value Ref Range Status   03/19/2021 55 >40 MG/DL Final     VLDL Date Value Ref Range Status   03/19/2021 15 MG/DL Final     Cholesterol   Date Value Ref Range Status   03/19/2021 174 <200 MG/DL Final     Triglycerides   Date Value Ref Range Status   03/19/2021 76 <150 MG/DL Final       Blood Glucose:  Hemoglobin A1C   Date Value Ref Range Status   03/19/2021 5.3 4.0 - 6.0 % Final     Comment:     The ADA recommends that most patients with type 1 and type 2 diabetes maintain   an A1c level <7%.       ______________________________________________________________     SUBJECTIVE     Patient seen for follow up in common area. Reports good sleep/appetite. Mood is good, denies depression/anxiety, denies SI/HI/AVH. Reports he is patiently waiting for placement, no acute concerns.      REVIEW OF SYSTEMS     Review of Systems   HENT: Negative for hearing loss.    Eyes:        Reports seeing floaters that change color   Respiratory: Negative for shortness of breath.    Cardiovascular: Negative for chest pain.   Gastrointestinal: Negative for abdominal pain.   Genitourinary: Negative for frequency.   Musculoskeletal: Negative for myalgias.   Neurological: Negative for sensory change and headaches.   Psychiatric/Behavioral: Negative for depression and suicidal ideas.        OBJECTIVE  Vital Signs:  Current                Vital Signs: 24 Hour Range   BP: 120/67 (07/20 0743)  Temp: 36.5 ?C (97.7 ?F) (07/20 0981)  Pulse: 60 (07/20 0743)  Respirations: 16 PER MINUTE (07/20 0743)  SpO2: 96 % (07/20 0743) BP: (120-132)/(67-80)   Temp:  [36.4 ?C (97.5 ?F)-36.5 ?C (97.7 ?F)]   Pulse:  [60-87]   Respirations:  [16 PER MINUTE-18 PER MINUTE]   SpO2:  [96 %]    Intensity Pain Scale (Self Report): (not recorded)      Sleep:  Hours of Sleep: 6.75   Quality of Sleep: Resting Quietly    Scheduled Medications:  divalproex (DEPAKOTE ER) ER tablet 2,500 mg, 2,500 mg, Oral, QHS  nicotine (NICODERM CQ STEP 2) 14 mg/day patch 1 patch, 1 patch, Transdermal, QDAY  paliperidone palmitate (INVEGA) (+) injection 156 mg, 156 mg, Intramuscular, Q28 Days (4 weeks)        PRN Medications:  acetaminophen Q6H PRN, calcium carbonate TID PRN, nicotine polacrilex Q2H PRN 4 mg at 05/18/21 2058, OLANZapine Q6H PRN **OR** OLANZapine Q6H PRN, polyethylene glycol 3350 QDAY PRN, traZODone QHS PRN 50 mg at 05/10/21 2129    Mental Status Exam:  ? General/Constitutional: 25 y.o. adult, appears stated age, dressed in hospital attire, fair hygiene   ? Behavior: Calm, cooperative, pleasant  ? Speech/Motor: Normal rate/tone/rhythm/volume, no PMA/PMR  ? Eye Contact: Good  ? Mood: good  ? Affect: euthymic, mood congruent  ? Thought Process: linear, goal directed  ? Associations: Intact  ? Thought Content: Denies SI/HI  ? Perception: Denies AVH. + visual disturbances of floaters. Does not appear to be responding to internal stimuli   ? Insight/Judgment: Fair/Fair    Focused Physical Exam:  ? Gait: no ataxia, ambulates without assistance  ? Neurological: no tic or tremor    ______________________________________________________________  Raquel Maryella Abood, MD

## 2021-05-19 NOTE — Care Plan
Problem: Social Interaction - Impaired  Goal: Increased Social Interaction  Outcome: Goal Ongoing

## 2021-05-19 NOTE — Progress Notes
Chart check completed

## 2021-05-19 NOTE — Progress Notes
[facility]  Therapy Progress Note    NAME:Roberto Caldwell Kronenberger MRN: [id number] DOB:[birth year] AGE: 25 y.o.  ADMISSION DATE: 03/16/2021 DAYS ADMITTED: LOS: 63 days    Date of Service:  05/19/21    Active Problems:    Schizoaffective disorder, bipolar type (HCC)    Tobacco use disorder, severe, dependence    Withdrawal from methamphetamine (HCC)    Methamphetamine use disorder, moderate (HCC)    Objective:  Therapist approached Patient in the milieu as he was walking up/down the hallway.  He agreed to a session.  Eye contact WNL.  Affect full; mood congruent.    Narrative:  Patient reported that he talked to a friend, who visited someone in the same facility that Patient is considering following discharge-.  This friend reported the facility looks good.  Residents have the freedom to go outdoors and there are individuals close to Patient's age.  As a result of this information, Patient said he was feeling more positive about the placement.      Patient also talked about taking greater responsibility for his life.  He said he recognizes mistakes and misjudgments he made in the past.  He identified several decisions he wished he could reverse.  Patient described the importance of learning from past mistakes without "beating myself up for it."  Therapist reinforced these insights and Patient's desire to learn from his past.  The importance of sobriety was emphasized.      Follow up:  Treatment Team will continue to follow.

## 2021-05-19 NOTE — Care Plan
Cainan is compliant with medications.  Pt denies AH.  Pt states he sees floaters.  Pt denies SIHISH.  Pt states slept pretty good and appetite is good.  Pt states mood is good and is waiting for state to approve his level two.  Pt denies anxiety and depression.  Pt denies pain.  Pt attends some groups and encouraged him to attend groups.    Problem: Health Maintenance - Impaired  Goal: Establish therapeutic relationships  Outcome: Goal Ongoing  Goal: Knowledge of health maintenance  Outcome: Goal Ongoing     Problem: Coping - Ineffective, Family  Goal: Effective Coping  Outcome: Goal Ongoing  Goal: Communicate with support staff  Outcome: Goal Ongoing  Goal: Knowledge of positive coping methods  Outcome: Goal Ongoing     Problem: Social Interaction - Impaired  Goal: Increased Social Interaction  Outcome: Goal Ongoing  Goal: Knowledge of social interaction  Outcome: Goal Ongoing     Problem: Discharge Planning  Goal: Participation in plan of care  Outcome: Goal Ongoing

## 2021-05-19 NOTE — Progress Notes
Pt was observed with congruent affect. Pt appeared to have WNL thought.  Pt was  oriented to person, place, time, situation. Pt did not appear to be responding to internal stimuli.  Pt did not appear to be making delusional statements.       Pt appeared to be calm and cooperative. Pt was observed spending the majority of the evening in the day room watching television and interacting with staff and other patients. Pt appeared to be interacting appropriately.     Depression/Anxiety: Pt rated depression as being 0/10 and anxiety being 0/10.     Suicidal Ideation/Homicidal ideation: Pt  denies suicidal ideation. Pt denies homicidal ideations. Pt reports  being able to contract for safety.     Audio/Visual Hallucinations:  Pt reports audio/ visual hallucinations.  Pt stated that he was experiencing, 'the usual', in regards to AVH.    Pt Vital signs were assessed:  Vitals*  Pulse: 87  BP Patient Position:  (sitting up in bed)  BP: 132/80  Respirations: 18 PER MINUTE  O2 Device: None (Room air)  SpO2: 96 %  SpO2 Location: Finger, Second (Index)    Medical Concerns: Pt denies  having new medical concerns.     Pain: Pt  denies having pain.    Pt will continue to be monitored.

## 2021-05-19 NOTE — Behavioral Health Treatment Team
.  TREATMENT TEAM NOTE  Name: Jamie [name]        MRN: [id number]          DOB: 11-08-1995            Age: 25 y.o.  Admission Date: 03/16/2021       LOS: 63 days    Date of Service: 05/19/2021        Attendees:  UR: Ival Bible, LMSW  Nursing: Freddie Apley, RN, BSN  Therapy: Clement Husbands, [place], [facility]  Therapy: Marijean Heath, LMLP, LCP  CM: Neysa Bonito, LPC  CM: Eden Lathe, LPC  Rec Therapy: Clare Charon, CTRS  Pharmacy: Dolphus Jenny, PharmD  Psychology: Assunta Gambles. Lowry Bowl, PhD  Psychology: Peter Minium, PhD  Psychiatry: Shella Maxim. Sherrine Maples, DO      Significant Points Discussed: Attends some groups, calm and cooperative, interacts with his peers while out in the milieu.    Treatment Plan Discussion: Waiting for placement

## 2021-05-20 NOTE — Care Plan
Problem: Health Maintenance - Impaired  Goal: Knowledge of health maintenance  Outcome: Goal Ongoing

## 2021-05-20 NOTE — Progress Notes
Chart check completed

## 2021-05-20 NOTE — Care Plan
Problem: Health Maintenance - Impaired  Goal: Establish therapeutic relationships  Outcome: Goal Ongoing  Goal: Knowledge of health maintenance  Outcome: Goal Ongoing     Problem: Coping - Ineffective, Family  Goal: Effective Coping  Outcome: Goal Ongoing  Goal: Communicate with support staff  Outcome: Goal Ongoing  Goal: Knowledge of positive coping methods  Outcome: Goal Ongoing     Problem: Social Interaction - Impaired  Goal: Increased Social Interaction  Outcome: Goal Ongoing  Goal: Knowledge of social interaction  Outcome: Goal Ongoing     Problem: Discharge Planning  Goal: Participation in plan of care  Outcome: Goal Ongoing

## 2021-05-20 NOTE — Progress Notes
PSYCHIATRIC PROGRESS NOTE     LOS: 64 days  Voluntary/Involuntary Status: Voluntary  Guardian? Yes  Jamie [name], Jamie [name] (Mother)   [phone]   Jamie [name], Jamie [name] (Father)  [phone]     MEDICATIONS   Current Psychotropic Medications:   1. [name] IM q4weeks, last dose 05/18/21  2. Depakote ER 2500 mg QHS for mood stability     ASSESSMENT & DIAGNOSES   Jamie [name] is a 25 y.o. Caucasian adult with a history of schizoaffective disorder and polysubstance abuse who presented to [facility] as a transfer from OSH with manic symptoms in context of methamphetamine use. This appears to be precipitated by methamphetamine use. Factors that seem to have predisposed him to mania include schizoaffective disorder, substance abuse and uncle with schizophrenia. This current problem is maintained by ongoing substance abuse, lack of consistent medication compliance, and poor insight. However, protective factors include good support from parents who are guardians, motivation to seek treatment, and fair insight into current situation. Proposed treatment will consist of substance detoxification and medication management.    DSM-5 DIAGNOSES:  1. Stimulant use disorder, methamphetamine type, moderate  2. Methamphetamine withdrawal, resolved  3. Schizoaffective disorder, bipolar type  4. Tobacco use disorder (chewing tobacco), severe  5. Stimulant use disorder, cocaine type, unspecified severity     PLAN     ? No indication for changes to psychotropic medications on 05/20/21.   ? Received Invega Sustenna 156 mg IM on 05/18/21.    ? Level 1 CARES assessment completed 6/8. Level 1 recommended Level 2 assessment. Level 2 completed 7/11.   ? SW interventions, has placement after level 2 processed.     Seen and discussed with Dr. Sherrine Maples.        Metabolic labs completed on:  Lipid Panel:   LDL   Date Value Ref Range Status   03/19/2021 106 (H) <100 mg/dL Final     HDL   Date Value Ref Range Status   03/19/2021 55 >40 MG/DL Final     VLDL Date Value Ref Range Status   03/19/2021 15 MG/DL Final     Cholesterol   Date Value Ref Range Status   03/19/2021 174 <200 MG/DL Final     Triglycerides   Date Value Ref Range Status   03/19/2021 76 <150 MG/DL Final       Blood Glucose:  Hemoglobin A1C   Date Value Ref Range Status   03/19/2021 5.3 4.0 - 6.0 % Final     Comment:     The ADA recommends that most patients with type 1 and type 2 diabetes maintain   an A1c level <7%.       ______________________________________________________________     SUBJECTIVE     Patient seen for follow up in common area. Reports good sleep, good appetite this morning and enjoying breakfast. Mood is pretty good, he says he is more excited about placement after talking with a friend about it. Denies SI/HI/AVH.      REVIEW OF SYSTEMS     Review of Systems   HENT: Negative for hearing loss.    Eyes:        Reports seeing floaters that change color   Respiratory: Negative for shortness of breath.    Cardiovascular: Negative for chest pain.   Gastrointestinal: Negative for abdominal pain.   Genitourinary: Negative for frequency.   Musculoskeletal: Negative for myalgias.   Neurological: Negative for sensory change and headaches.   Psychiatric/Behavioral: Negative for depression and suicidal ideas.  OBJECTIVE                    Vital Signs:  Current                Vital Signs: 24 Hour Range   BP: 107/56 (07/21 0700)  Temp: 36.7 ?C (98.1 ?F) (07/21 0700)  Pulse: 60 (07/21 0700)  Respirations: 16 PER MINUTE (07/21 0700)  SpO2: 96 % (07/21 0700) BP: (107-129)/(56-71)   Temp:  [36.7 ?C (98.1 ?F)-37.2 ?C (99 ?F)]   Pulse:  [60-82]   Respirations:  [16 PER MINUTE]   SpO2:  [96 %-100 %]    Intensity Pain Scale (Self Report): (not recorded)      Sleep:  Hours of Sleep: 6.5   Quality of Sleep: Resting Quietly    Scheduled Medications:  divalproex (DEPAKOTE ER) ER tablet 2,500 mg, 2,500 mg, Oral, QHS  nicotine (NICODERM CQ STEP 2) 14 mg/day patch 1 patch, 1 patch, Transdermal, QDAY  paliperidone palmitate (INVEGA) (+) injection 156 mg, 156 mg, Intramuscular, Q28 Days (4 weeks)        PRN Medications:  acetaminophen Q6H PRN, calcium carbonate TID PRN, nicotine polacrilex Q2H PRN 4 mg at 05/19/21 2049, OLANZapine Q6H PRN **OR** OLANZapine Q6H PRN, polyethylene glycol 3350 QDAY PRN, traZODone QHS PRN 50 mg at 05/10/21 2129    Mental Status Exam:  ? General/Constitutional: 25 y.o. adult, appears stated age, dressed in hospital attire, fair hygiene   ? Behavior: Calm, cooperative, pleasant  ? Speech/Motor: Normal rate/tone/rhythm/volume, no PMA/PMR  ? Eye Contact: Good  ? Mood: pretty good  ? Affect: euthymic, mood congruent  ? Thought Process: linear, goal directed  ? Associations: Intact  ? Thought Content: Denies SI/HI  ? Perception: Denies AVH. + visual disturbances of floaters. Does not appear to be responding to internal stimuli   ? Insight/Judgment: Fair/Fair    Focused Physical Exam:  ? Gait: no ataxia, ambulates without assistance  ? Neurological: no tic or tremor    ______________________________________________________________  Raquel Shivani Barrantes, MD

## 2021-05-20 NOTE — Progress Notes
Chart check completed.

## 2021-05-20 NOTE — Behavioral Health Treatment Team
TREATMENT TEAM NOTE  Name: Parv Manthey        MRN: [id number]          DOB: 1996-06-28            Age: 25 y.o.  Admission Date: 03/16/2021       LOS: 64 days    Date of Service: 05/20/2021        Attendees:  UR: Ival Bible, LMSW   Nursing: Pearline Cables RN  Therapy: Clement Husbands, LSCSW, [facility]  Therapy: Marijean Heath, LMLP, LCP  CM: Neysa Bonito, LPC  CM: Eden Lathe, LPC  Rec Therapy: Clare Charon, CTRS  Nurse Manager: Ursula Beath, RN  Pharmacy: Dolphus Jenny, PharmD  Psychology: Assunta Gambles. Lowry Bowl, PhD  Psychiatry: Shella Maxim. Sherrine Maples, DO       Significant Points Discussed: Calm and cooperative, did not attend groups, went out side with staff.    Treatment Plan Discussion: Waiting for placement

## 2021-05-20 NOTE — Progress Notes
[facility]  Therapy Progress Note    NAME:Jamie [name] MRN: [id number] DOB:04-06-1996 AGE: 25 y.o.  ADMISSION DATE: 03/16/2021 DAYS ADMITTED: LOS: 64 days    Date of Service:  05/20/21    Active Problems:    Schizoaffective disorder, bipolar type (HCC)    Tobacco use disorder, severe, dependence    Withdrawal from methamphetamine [facility])    Methamphetamine use disorder, moderate (HCC)    Objective:  Therapist approached Patient as he was walking up/down the hallway; he agreed to engage in a brief session.  Eye contact WNL.  Affect full; mood positive.    Narrative:  Therapist reinforced Patient's positive attitude regarding placement and the time it has taken.  Some discussion also took place, comparing some of Patient's past challenges and the significant improvement he has obtained.  Discussion also focused on the inherent challenges of breaking a nicotine addiction.  Patient appropriately joked with this Therapist when he pointed out that nicotine is less problematic than methamphetamines.  Patient's attitude and determination to be more physically active was reinforced.    Follow up:  Treatment Team will continue to follow.

## 2021-05-20 NOTE — Progress Notes
Pt was observed with congruent affect. Pt appeared to have WNL thought.  Pt was  oriented to person, place, time, situation. Pt did not appear to be responding to internal stimuli.  Pt did not appear to be making delusional statements.       Pt appeared to be calm and cooperative. Pt was observed spending the majority of the evening in the day room watching television and playing cards. Pt was observed interacting with staff and patients and appeared to be interacting appropriately.     Depression/Anxiety: Pt rated depression as being 0/10 and anxiety being 0/10.     Suicidal Ideation/Homicidal ideation: Pt  denies suicidal ideation. Pt denies homicidal ideations. Pt reports  being able to contract for safety.     Audio/Visual Hallucinations:  Pt reports audio/ visual hallucinations.  When asked pt stated that he was experiencing, 'the usual.'   Pt Vital signs were assessed:  Vitals*  Pulse: 82  BP Patient Position: Chair  BP: 129/71  Respirations: 16 PER MINUTE  O2 Device: None (Room air)  SpO2: 100 %  SpO2 Location: Finger, Second (Index)    Medical Concerns: Pt denies  having new medical concerns.     Pain: Pt  denies having pain.    Pt will continue to be monitored.

## 2021-05-20 NOTE — Care Coordination-Inpatient
Care Coordination - Inpatient Note      Patient Name:   Jamie [name]                        MRN:  [id number]  Admission Date:  03/16/2021    11:00am  CM emailed patient's mother [name] at [email or web address] with a letter for court regarding patient's length of hospitalization. Updated guardian on waiting for the [employer] to approve the Level II assessment.     Durelle Zepeda  05/20/2021

## 2021-05-20 NOTE — Progress Notes
PSYCHIATRIC NURSING ASSESSMENT     NAME:Mancel Telesforo Brosnahan             MRN: [id number]             DOB:05-17-1996          AGE: 25 y.o.  ADMISSION DATE: 03/16/2021             DAYS ADMITTED: LOS: 64 days    Mental Status Exam  Legal Status: Voluntary Admission  General Appearance: Normal  Mood / Affect: Congruent  Speech: Normal  Content Of Thought: Normal  Motor Activity: Normal  Flow of Thought: Normal  Sensorium: Normal  Insight / Judgment: Fair judgment, Fair insight  Behavior: Calm, Cooperative, Follows commands  Patient Strengths: Exercising self-direction, Knowledge of medications, Managing surrounding demands and opportunities, Assessment of patient optimism that change can occur    Dravin denies depression, anxiety, SI/HI, AVH, and pain. He walks around the hallways during the day. Pt socializes appropriately with peers on the unit. His appetite and sleep have been within normal limits. Pt does report some frustration related to waiting for placement and lack of updates throughout the process. PRN nicotine gum administered per orders for cravings.     Encouraged him to come to staff with concerns. Pt is on 15 minute checks to monitor for safety and behaviors.     Renee Ramus, RN  05/20/2021

## 2021-05-21 NOTE — Progress Notes
Chart check completed.

## 2021-05-21 NOTE — Progress Notes
PSYCHIATRIC PROGRESS NOTE     LOS: 65 days  Voluntary/Involuntary Status: Voluntary  Guardian? Yes  Lesly, Joslyn (Mother)   [phone]   Alika, Saladin (Father)  [phone]     MEDICATIONS   Current Psychotropic Medications:   1. [name] IM q4weeks, last dose 05/18/21  2. Depakote ER 2500 mg QHS for mood stability     ASSESSMENT & DIAGNOSES   Jamie [name] is a 25 y.o. Caucasian adult with a history of schizoaffective disorder and polysubstance abuse who presented to [facility] as a transfer from OSH with manic symptoms in context of methamphetamine use. This appears to be precipitated by methamphetamine use. Factors that seem to have predisposed him to mania include schizoaffective disorder, substance abuse and uncle with schizophrenia. This current problem is maintained by ongoing substance abuse, lack of consistent medication compliance, and poor insight. However, protective factors include good support from parents who are guardians, motivation to seek treatment, and fair insight into current situation. Proposed treatment will consist of substance detoxification and medication management.    DSM-5 DIAGNOSES:  1. Stimulant use disorder, methamphetamine type, moderate  2. Methamphetamine withdrawal, resolved  3. Schizoaffective disorder, bipolar type  4. Tobacco use disorder (chewing tobacco), severe  5. Stimulant use disorder, cocaine type, unspecified severity     PLAN     ? No indication for changes to psychotropic medications on 05/21/21.   ? Received Invega Sustenna 156 mg IM on 05/18/21.    ? Level 1 CARES assessment completed 6/8. Level 1 recommended Level 2 assessment. Level 2 completed 7/11.   ? SW interventions, has placement after level 2 processed.     Seen and discussed with Dr. Sherrine Maples.        Metabolic labs completed on:  Lipid Panel:   LDL   Date Value Ref Range Status   03/19/2021 106 (H) <100 mg/dL Final     HDL   Date Value Ref Range Status   03/19/2021 55 >40 MG/DL Final     VLDL Date Value Ref Range Status   03/19/2021 15 MG/DL Final     Cholesterol   Date Value Ref Range Status   03/19/2021 174 <200 MG/DL Final     Triglycerides   Date Value Ref Range Status   03/19/2021 76 <150 MG/DL Final       Blood Glucose:  Hemoglobin A1C   Date Value Ref Range Status   03/19/2021 5.3 4.0 - 6.0 % Final     Comment:     The ADA recommends that most patients with type 1 and type 2 diabetes maintain   an A1c level <7%.       ______________________________________________________________     SUBJECTIVE     Patient seen for follow up in common area. Mood is pretty good today, denies SI/HI/AVH. Slept well, reports good appetite. No concerns, awaiting placement.      REVIEW OF SYSTEMS     Review of Systems   HENT: Negative for hearing loss.    Eyes:        Reports seeing floaters that change color   Respiratory: Negative for shortness of breath.    Cardiovascular: Negative for chest pain.   Gastrointestinal: Negative for abdominal pain.   Genitourinary: Negative for frequency.   Musculoskeletal: Negative for myalgias.   Neurological: Negative for sensory change and headaches.   Psychiatric/Behavioral: Negative for depression and suicidal ideas.        OBJECTIVE  Vital Signs:  Current                Vital Signs: 24 Hour Range   BP: 100/50 (07/22 0722)  Temp: 36.7 ?C (98.1 ?F) (07/22 2841)  Pulse: 79 (07/22 0722)  Respirations: 18 PER MINUTE (07/22 0722)  SpO2: 95 % (07/22 0722) BP: (100-122)/(50-68)   Temp:  [36.7 ?C (98.1 ?F)-36.9 ?C (98.4 ?F)]   Pulse:  [73-79]   Respirations:  [18 PER MINUTE]   SpO2:  [95 %-98 %]    Intensity Pain Scale (Self Report): (not recorded)      Sleep:  Hours of Sleep: 5.25   Quality of Sleep: Resting Quietly    Scheduled Medications:  divalproex (DEPAKOTE ER) ER tablet 2,500 mg, 2,500 mg, Oral, QHS  nicotine (NICODERM CQ STEP 2) 14 mg/day patch 1 patch, 1 patch, Transdermal, QDAY  paliperidone palmitate (INVEGA) (+) injection 156 mg, 156 mg, Intramuscular, Q28 Days (4 weeks)        PRN Medications:  acetaminophen Q6H PRN, calcium carbonate TID PRN, nicotine polacrilex Q2H PRN 4 mg at 05/20/21 1611, OLANZapine Q6H PRN **OR** OLANZapine Q6H PRN, polyethylene glycol 3350 QDAY PRN, traZODone QHS PRN 50 mg at 05/10/21 2129    Mental Status Exam:  ? General/Constitutional: 25 y.o. adult, appears stated age, dressed in hospital attire, fair hygiene   ? Behavior: Calm, cooperative, pleasant  ? Speech/Motor: Normal rate/tone/rhythm/volume, no PMA/PMR  ? Eye Contact: Good  ? Mood: pretty good  ? Affect: euthymic, mood congruent  ? Thought Process: linear, goal directed  ? Associations: Intact  ? Thought Content: Denies SI/HI  ? Perception: Denies AVH. + visual disturbances of floaters. Does not appear to be responding to internal stimuli   ? Insight/Judgment: Fair/Fair    Focused Physical Exam:  ? Gait: no ataxia, ambulates without assistance  ? Neurological: no tic or tremor    ______________________________________________________________  Raquel Aoife Bold, MD

## 2021-05-21 NOTE — Care Plan
Problem: Health Maintenance - Impaired  Goal: Establish therapeutic relationships  Outcome: Goal Ongoing  Goal: Knowledge of health maintenance  Outcome: Goal Ongoing     Problem: Coping - Ineffective, Family  Goal: Effective Coping  Outcome: Goal Ongoing  Goal: Communicate with support staff  Outcome: Goal Ongoing  Goal: Knowledge of positive coping methods  Outcome: Goal Ongoing     Problem: Social Interaction - Impaired  Goal: Increased Social Interaction  Outcome: Goal Ongoing  Goal: Knowledge of social interaction  Outcome: Goal Ongoing     Problem: Discharge Planning  Goal: Participation in plan of care  Outcome: Goal Ongoing

## 2021-05-21 NOTE — Progress Notes
Pt was present in the milieu. Pt denied depression, anxiety, SI, HI, AVH and pain. Pt shared with RN he was waiting on a letter from the state for placement. Pt compliant with prescribed HS medications and received no PRNs. Pt will continue to be monitored per protocol. Pt encouraged to come to staff with questions or concerns.

## 2021-05-21 NOTE — Care Plan
Problem: Health Maintenance - Impaired  Goal: Knowledge of health maintenance  Outcome: Goal Ongoing     Problem: Coping - Ineffective, Family  Goal: Communicate with support staff  Outcome: Goal Ongoing     Problem: Discharge Planning  Goal: Participation in plan of care  Outcome: Goal Ongoing

## 2021-05-22 NOTE — Progress Notes
Chart check completed.

## 2021-05-22 NOTE — Progress Notes
Pt has been calm, cooperative and compliant with all aspects of care. Pt expresses that his mood is great, behaviors are well controlled. Paces with peer part of this shift. He denies any SI, HI, AH and VH. Will continue to monitor.

## 2021-05-22 NOTE — Care Plan
Patient resting in bed after breakfast. Awaiting placement. Minimal group attendance. He interacts well with peers and staff.      Problem: Health Maintenance - Impaired  Goal: Establish therapeutic relationships  Outcome: Goal Ongoing     Problem: Coping - Ineffective, Family  Goal: Effective Coping  Outcome: Goal Ongoing     Problem: Social Interaction - Impaired  Goal: Increased Social Interaction  Outcome: Goal Ongoing     Problem: Discharge Planning  Goal: Participation in plan of care  Outcome: Goal Ongoing

## 2021-05-22 NOTE — Progress Notes
PSYCHIATRIC PROGRESS NOTE     LOS: 66 days  Voluntary/Involuntary Status: Voluntary  Guardian? Yes  Craig, Ionescu (Mother)   [phone]   Mykah, Shin (Father)  [phone]     MEDICATIONS   Current Psychotropic Medications:   1. [name] IM q4weeks, last dose 05/18/21  2. Depakote ER 2500 mg QHS for mood stability     ASSESSMENT & DIAGNOSES   Jamie [name] is a 25 y.o. Caucasian adult with a history of schizoaffective disorder and polysubstance abuse who presented to [facility] as a transfer from OSH with manic symptoms in context of methamphetamine use. This appears to be precipitated by methamphetamine use. Factors that seem to have predisposed him to mania include schizoaffective disorder, substance abuse and uncle with schizophrenia. This current problem is maintained by ongoing substance abuse, lack of consistent medication compliance, and poor insight. However, protective factors include good support from parents who are guardians, motivation to seek treatment, and fair insight into current situation. Proposed treatment will consist of substance detoxification and medication management.    DSM-5 DIAGNOSES:  1. Stimulant use disorder, methamphetamine type, moderate  2. Methamphetamine withdrawal, resolved  3. Schizoaffective disorder, bipolar type  4. Tobacco use disorder (chewing tobacco), severe  5. Stimulant use disorder, cocaine type, unspecified severity     PLAN     ? No indication for changes to psychotropic medications on 05/22/21.   ? Received Invega Sustenna 156 mg IM on 05/18/21.    ? Continue Depakote 2500mg  QHS.  ? PRNs for agitation, anxiety, sleep  ? Level 1 CARES assessment completed 6/8. Level 1 recommended Level 2 assessment. Level 2 completed 7/11.   ? SW interventions, has placement after level 2 processed.     Seen and discussed with Dr. Gilmore Laroche         Metabolic labs completed on:  Lipid Panel:   LDL   Date Value Ref Range Status   03/19/2021 106 (H) <100 mg/dL Final     HDL Date Value Ref Range Status   03/19/2021 55 >40 MG/DL Final     VLDL   Date Value Ref Range Status   03/19/2021 15 MG/DL Final     Cholesterol   Date Value Ref Range Status   03/19/2021 174 <200 MG/DL Final     Triglycerides   Date Value Ref Range Status   03/19/2021 76 <150 MG/DL Final       Blood Glucose:  Hemoglobin A1C   Date Value Ref Range Status   03/19/2021 5.3 4.0 - 6.0 % Final     Comment:     The ADA recommends that most patients with type 1 and type 2 diabetes maintain   an A1c level <7%.       ______________________________________________________________     SUBJECTIVE     [name] was seen today in his room. Reports that he is doing well. Able to converse linearly. Reports always seeing stars/floaters overlaying his vision, but this is the same as his baseline.      REVIEW OF SYSTEMS     Review of Systems   Constitutional: Negative for weight loss.   Psychiatric/Behavioral: Negative for suicidal ideas.        OBJECTIVE                    Vital Signs:  Current                Vital Signs: 24 Hour Range   BP: 120/68 (07/23  0700)  Temp: 36.7 ?C (98 ?F) (07/23 0700)  Pulse: 58 (07/23 0700)  Respirations: 14 PER MINUTE (07/23 0700)  SpO2: 96 % (07/23 0700) BP: (120-123)/(67-68)   Temp:  [36.6 ?C (97.9 ?F)-36.7 ?C (98 ?F)]   Pulse:  [58-74]   Respirations:  [14 PER MINUTE-16 PER MINUTE]   SpO2:  [96 %-98 %]    Intensity Pain Scale (Self Report): (not recorded)      Sleep:  Hours of Sleep: 5.25   Quality of Sleep: Resting Quietly    Scheduled Medications:  divalproex (DEPAKOTE ER) ER tablet 2,500 mg, 2,500 mg, Oral, QHS  nicotine (NICODERM CQ STEP 2) 14 mg/day patch 1 patch, 1 patch, Transdermal, QDAY  paliperidone palmitate (INVEGA) (+) injection 156 mg, 156 mg, Intramuscular, Q28 Days (4 weeks)        PRN Medications:  acetaminophen Q6H PRN, calcium carbonate TID PRN, nicotine polacrilex Q2H PRN 4 mg at 05/21/21 2014, OLANZapine Q6H PRN **OR** OLANZapine Q6H PRN, polyethylene glycol 3350 QDAY PRN, traZODone QHS PRN 50 mg at 05/10/21 2129    Mental Status Exam:  ? General/Constitutional: 25 y.o. adult, appears stated age, wearing personal attire and hospital attire. moderately groomed.  ? Behavior: Overall calm, cooperative, appropriate for conversation and lying in bed  ? Speech/Motor: regular in rate, rhythm, volume, frequency. normal tone and good articulation  ? Eye Contact: good  ? Mood: pretty good  ? Affect: mood congurent. euthymic  ? Thought Process: linear and goal directed  ? Associations: Intact  ? Thought Content: denies suicidal ideations.  denies homicidal ideations.   ? Perception: endorses chronic visual Hallucinations, denies auditory Hallucinations. Doesn't appear to be responding to internal stimuli  ? Insight/Judgment: fair/fair    ? Orientation: appears alert and oriented, though not formally evaluated  ? Recent and Remote Memory: Intact  ? Attention span and concentration: appropriate for conversation  ? Language: fluent, English  ? Fund of knowledge and vocabulary: average    Focused Physical Exam:  ? Neurological: no tic or tremor    ______________________________________________________________  Deirdre Evener, MD

## 2021-05-23 NOTE — Progress Notes
Chart check completed.

## 2021-05-23 NOTE — Progress Notes
PSYCHIATRIC PROGRESS NOTE     LOS: 67 days  Voluntary/Involuntary Status: Voluntary  Guardian? Yes  Jamie [name], Jamie [name] (Mother)   [phone]   Jamie [name], Jamie [name] (Father)  [phone]     MEDICATIONS   Current Psychotropic Medications:   1. [name] IM q4weeks, last dose 05/18/21  2. Depakote ER 2500 mg QHS for mood stability     ASSESSMENT & DIAGNOSES   Jamie [name] is a 25 y.o. Caucasian adult with a history of schizoaffective disorder and polysubstance abuse who presented to [facility] as a transfer from OSH with manic symptoms in context of methamphetamine use. This appears to be precipitated by methamphetamine use. Factors that seem to have predisposed him to mania include schizoaffective disorder, substance abuse and uncle with schizophrenia. This current problem is maintained by ongoing substance abuse, lack of consistent medication compliance, and poor insight. However, protective factors include good support from parents who are guardians, motivation to seek treatment, and fair insight into current situation. Proposed treatment will consist of substance detoxification and medication management.    DSM-5 DIAGNOSES:  1. Stimulant use disorder, methamphetamine type, moderate  2. Methamphetamine withdrawal, resolved  3. Schizoaffective disorder, bipolar type  4. Tobacco use disorder (chewing tobacco), severe  5. Stimulant use disorder, cocaine type, unspecified severity     PLAN     ? No indication for changes to psychotropic medications on 05/23/21.   ? Received Invega Sustenna 156 mg IM on 05/18/21.    ? Continue Depakote 2500mg  QHS.  ? PRNs for agitation, anxiety, sleep  ? Level 1 CARES assessment completed 6/8. Level 1 recommended Level 2 assessment. Level 2 completed 7/11.   ? SW interventions, has placement after level 2 processed.     Seen and discussed with Dr. Gilmore Laroche         Metabolic labs completed on:  Lipid Panel:   LDL   Date Value Ref Range Status   03/19/2021 106 (H) <100 mg/dL Final     HDL Date Value Ref Range Status   03/19/2021 55 >40 MG/DL Final     VLDL   Date Value Ref Range Status   03/19/2021 15 MG/DL Final     Cholesterol   Date Value Ref Range Status   03/19/2021 174 <200 MG/DL Final     Triglycerides   Date Value Ref Range Status   03/19/2021 76 <150 MG/DL Final       Blood Glucose:  Hemoglobin A1C   Date Value Ref Range Status   03/19/2021 5.3 4.0 - 6.0 % Final     Comment:     The ADA recommends that most patients with type 1 and type 2 diabetes maintain   an A1c level <7%.       ______________________________________________________________     SUBJECTIVE     Jamie [name] was seen today in his room. Denies any physical concerns. Slept well. Tolerating appetite without concerns and reports good energy. Denies SI HI AH VH.      REVIEW OF SYSTEMS     Review of Systems   Constitutional: Negative for weight loss.   Psychiatric/Behavioral: Negative for suicidal ideas.        OBJECTIVE                    Vital Signs:  Current                Vital Signs: 24 Hour Range   BP: 123/68 (07/23 2000)  Temp: 36.6 ?C (97.9 ?  F) (07/23 2000)  Pulse: 85 (07/23 2000)  Respirations: 17 PER MINUTE (07/23 2000)  SpO2: 97 % (07/23 2000) BP: (123)/(68)   Temp:  [36.6 ?C (97.9 ?F)]   Pulse:  [85]   Respirations:  [17 PER MINUTE]   SpO2:  [97 %]    Intensity Pain Scale (Self Report): (not recorded)      Sleep:  Hours of Sleep: 6.50   Quality of Sleep: Resting Quietly    Scheduled Medications:  divalproex (DEPAKOTE ER) ER tablet 2,500 mg, 2,500 mg, Oral, QHS  nicotine (NICODERM CQ STEP 2) 14 mg/day patch 1 patch, 1 patch, Transdermal, QDAY  paliperidone palmitate (INVEGA) (+) injection 156 mg, 156 mg, Intramuscular, Q28 Days (4 weeks)        PRN Medications:  acetaminophen Q6H PRN, calcium carbonate TID PRN, nicotine polacrilex Q2H PRN 4 mg at 05/22/21 2021, OLANZapine Q6H PRN **OR** OLANZapine Q6H PRN, polyethylene glycol 3350 QDAY PRN, traZODone QHS PRN 50 mg at 05/10/21 2129    Mental Status Exam:  ? General/Constitutional: 25 y.o. adult, appears stated age, wearing personal attire and hospital attire. moderately groomed.  ? Behavior: Overall calm, cooperative, appropriate for conversation and lying in bed  ? Speech/Motor: regular in rate, rhythm, volume, frequency. normal tone and good articulation  ? Eye Contact: good  ? Mood: good  ? Affect: mood congurent. euthymic  ? Thought Process: linear and goal directed  ? Associations: Intact  ? Thought Content: denies suicidal ideations.  denies homicidal ideations.   ? Perception: endorses chronic visual Hallucinations, denies auditory Hallucinations. Doesn't appear to be responding to internal stimuli  ? Insight/Judgment: fair/fair    ? Orientation: appears alert and oriented, though not formally evaluated  ? Recent and Remote Memory: Intact  ? Attention span and concentration: appropriate for conversation  ? Language: fluent, English  ? Fund of knowledge and vocabulary: average    Focused Physical Exam:  ? Neurological: no tic or tremor    ______________________________________________________________  Noni Saupe, MD

## 2021-05-23 NOTE — Care Plan
Jamie [name] is compliant with medications.  Pt denies SIHISH.  Pt has visual hallucinations of floaters.  Pt denies AH. Pt states it was hard to get to sleep and appetite is pretty good.  Pt states his mood is fine and feels good, there are nice people and nice facility..  Pt denies anxiety and depression.  Pt denies pain.  Pt encouraged to attend groups.  Pt denies any questions or concerns at this time.    Problem: Health Maintenance - Impaired  Goal: Establish therapeutic relationships  Outcome: Goal Ongoing  Goal: Knowledge of health maintenance  Outcome: Goal Ongoing     Problem: Coping - Ineffective, Family  Goal: Effective Coping  Outcome: Goal Ongoing  Goal: Communicate with support staff  Outcome: Goal Ongoing  Goal: Knowledge of positive coping methods  Outcome: Goal Ongoing     Problem: Social Interaction - Impaired  Goal: Increased Social Interaction  Outcome: Goal Ongoing  Goal: Knowledge of social interaction  Outcome: Goal Ongoing     Problem: Discharge Planning  Goal: Participation in plan of care  Outcome: Goal Ongoing

## 2021-05-23 NOTE — Progress Notes
Pt calm and cooperative during assessment. Compliant with meds, refuses to attend/participate wrap up groups. Denies SI/HI/AVH and pain. Mood and behavior appeared stable. Interact appropriately with staff and peers. No new concern verbalized. Will cont to monitor

## 2021-05-24 NOTE — Behavioral Health Treatment Team
TREATMENT TEAM NOTE  Name: Benno Brensinger        MRN: [id number]          DOB: 12-27-1995            Age: 25 y.o.  Admission Date: 03/16/2021       LOS: 68 days    Date of Service: 05/24/2021        Attendees:  UR: Ival Bible, LMSW  Nursing: Romilda Joy, BSN, RN  Therapy: Clement Husbands, [place], [facility]  Therapy: Marijean Heath, LMLP, LCP  CM: Neysa Bonito, LPC  CM: Eden Lathe, [facility]  Activities Coordinator: Philippa Chester  Nurse Manager: Ursula Beath, RN  Pharmacy: Shelda Altes, PharmD  Psychology: Peter Minium, PhD  Psychiatry: Shella Maxim. Sherrine Maples, DO  Psychiatry: Frankey Poot, APRN-NP  Psychiatry Resident: Doylene Bode, MD  Psychiatry Resident: Chauncy Passy, MD    Significant Points Discussed: No updates    Treatment Plan Discussion: No updates

## 2021-05-24 NOTE — Progress Notes
PSYCHIATRIC PROGRESS NOTE     LOS: 68 days  Voluntary/Involuntary Status: Voluntary  Guardian? Yes  Delawrence, Fridman (Mother)   [phone]   Carnell, Beavers (Father)  [phone]     MEDICATIONS   Current Psychotropic Medications:   1. [name] IM q4weeks, last dose 05/18/21  2. Depakote ER 2500 mg QHS for mood stability     ASSESSMENT & DIAGNOSES   Jamie [name] is a 25 y.o. Caucasian adult with a history of schizoaffective disorder and polysubstance abuse who presented to [facility] as a transfer from OSH with manic symptoms in context of methamphetamine use. This appears to be precipitated by methamphetamine use. Factors that seem to have predisposed him to mania include schizoaffective disorder, substance abuse and uncle with schizophrenia. This current problem is maintained by ongoing substance abuse, lack of consistent medication compliance, and poor insight. However, protective factors include good support from parents who are guardians, motivation to seek treatment, and fair insight into current situation. Proposed treatment will consist of substance detoxification and medication management.    DSM-5 DIAGNOSES:  1. Stimulant use disorder, methamphetamine type, moderate  2. Methamphetamine withdrawal, resolved  3. Schizoaffective disorder, bipolar type  4. Tobacco use disorder (chewing tobacco), severe  5. Stimulant use disorder, cocaine type, unspecified severity     PLAN     ? No indication for changes to psychotropic medications on 05/24/21.   ? Received Invega Sustenna 156 mg IM on 05/18/21.    ? Continue Depakote 2500mg  QHS.  ? PRNs for agitation, anxiety, sleep  ? Level 1 CARES assessment completed 6/8. Level 1 recommended Level 2 assessment. Level 2 completed 7/11.   ? SW interventions, has placement after level 2 processed.     Seen and discussed with Dr. Sherrine Maples        Metabolic labs completed on:  Lipid Panel:   LDL   Date Value Ref Range Status   03/19/2021 106 (H) <100 mg/dL Final     HDL Date Value Ref Range Status   03/19/2021 55 >40 MG/DL Final     VLDL   Date Value Ref Range Status   03/19/2021 15 MG/DL Final     Cholesterol   Date Value Ref Range Status   03/19/2021 174 <200 MG/DL Final     Triglycerides   Date Value Ref Range Status   03/19/2021 76 <150 MG/DL Final       Blood Glucose:  Hemoglobin A1C   Date Value Ref Range Status   03/19/2021 5.3 4.0 - 6.0 % Final     Comment:     The ADA recommends that most patients with type 1 and type 2 diabetes maintain   an A1c level <7%.       ______________________________________________________________     SUBJECTIVE     Jamie [name] was seen for follow up in his room today. Mood is good, denies SI/HI/AVH. No acute concerns, still awaiting placement. Sleeping well, good appetite.      REVIEW OF SYSTEMS     Review of Systems   Respiratory: Negative for shortness of breath.    Cardiovascular: Negative for chest pain.   Psychiatric/Behavioral: Negative for suicidal ideas. The patient does not have insomnia.         OBJECTIVE                    Vital Signs:  Current                Vital Signs:  24 Hour Range   BP: 105/63 (07/25 0700)  Temp: 36.5 ?C (97.7 ?F) (07/25 0700)  Pulse: 53 (07/25 0700)  Respirations: 14 PER MINUTE (07/25 0700)  SpO2: 95 % (07/25 0700) BP: (105-108)/(63-64)   Temp:  [36.5 ?C (97.7 ?F)-36.9 ?C (98.4 ?F)]   Pulse:  [53-97]   Respirations:  [14 PER MINUTE-16 PER MINUTE]   SpO2:  [95 %-97 %]    Intensity Pain Scale (Self Report): (not recorded)      Sleep:  Hours of Sleep: 3.75   Quality of Sleep: Resting Quietly    Scheduled Medications:  divalproex (DEPAKOTE ER) ER tablet 2,500 mg, 2,500 mg, Oral, QHS  nicotine (NICODERM CQ STEP 2) 14 mg/day patch 1 patch, 1 patch, Transdermal, QDAY  paliperidone palmitate (INVEGA) (+) injection 156 mg, 156 mg, Intramuscular, Q28 Days (4 weeks)        PRN Medications:  acetaminophen Q6H PRN, calcium carbonate TID PRN, nicotine polacrilex Q2H PRN 4 mg at 05/23/21 2046, OLANZapine Q6H PRN **OR** OLANZapine Q6H PRN, polyethylene glycol 3350 QDAY PRN, traZODone QHS PRN 50 mg at 05/10/21 2129    Mental Status Exam:  ? General/Constitutional: 25 y.o. adult, appears stated age, wearing personal attire and hospital attire. moderately groomed.  ? Behavior: Overall calm, cooperative, appropriate for conversation and lying in bed  ? Speech/Motor: regular in rate, rhythm, volume, frequency. normal tone and good articulation  ? Eye Contact: good  ? Mood: good  ? Affect: mood congurent. euthymic  ? Thought Process: linear and goal directed  ? Associations: Intact  ? Thought Content: denies suicidal ideations.  denies homicidal ideations.   ? Perception: endorses chronic visual Hallucinations, denies auditory Hallucinations. Doesn't appear to be responding to internal stimuli  ? Insight/Judgment: fair/fair    ? Orientation: appears alert and oriented, though not formally evaluated  ? Recent and Remote Memory: Intact  ? Attention span and concentration: appropriate for conversation  ? Language: fluent, English  ? Fund of knowledge and vocabulary: average    Focused Physical Exam:  ? Neurological: no tic or tremor    ______________________________________________________________  Raquel Gilmer Kaminsky, MD

## 2021-05-24 NOTE — Progress Notes
Chart check completed

## 2021-05-24 NOTE — Care Plan
Pt has been calm and cooperative. Mood and behavior have been stable. Denies all except visual hallucinations. Med compliant. No behavioral issues, will continue to monitor...      Problem: Health Maintenance - Impaired  Goal: Establish therapeutic relationships  Outcome: Goal Ongoing  Goal: Knowledge of health maintenance  Outcome: Goal Ongoing     Problem: Coping - Ineffective, Family  Goal: Effective Coping  Outcome: Goal Ongoing  Goal: Communicate with support staff  Outcome: Goal Ongoing  Goal: Knowledge of positive coping methods  Outcome: Goal Ongoing     Problem: Social Interaction - Impaired  Goal: Increased Social Interaction  Outcome: Goal Ongoing  Goal: Knowledge of social interaction  Outcome: Goal Ongoing     Problem: Discharge Planning  Goal: Participation in plan of care  Outcome: Goal Ongoing

## 2021-05-25 NOTE — Care Plan
Pt has been calm, cooperative and compliant. Appropriate behaviors. Denies all psych. Med compliant, will continue to monitor...      Problem: Health Maintenance - Impaired  Goal: Establish therapeutic relationships  Outcome: Goal Ongoing  Goal: Knowledge of health maintenance  Outcome: Goal Ongoing     Problem: Coping - Ineffective, Family  Goal: Effective Coping  Outcome: Goal Ongoing  Goal: Communicate with support staff  Outcome: Goal Ongoing  Goal: Knowledge of positive coping methods  Outcome: Goal Ongoing     Problem: Social Interaction - Impaired  Goal: Increased Social Interaction  Outcome: Goal Ongoing  Goal: Knowledge of social interaction  Outcome: Goal Ongoing     Problem: Discharge Planning  Goal: Participation in plan of care  Outcome: Goal Ongoing

## 2021-05-25 NOTE — Care Plan
Patient spent the evening in the day room socializing with peers. Patient calm, cooperative, and friendly with staff. Patient has no medical concerns at this time. Patient requested nicotine gum 4mg  for cravings. Patient denies all psych. Will continue to monitor per protocol.       Problem: Health Maintenance - Impaired  Goal: Establish therapeutic relationships  Outcome: Goal Ongoing  Goal: Knowledge of health maintenance  Outcome: Goal Ongoing     Problem: Coping - Ineffective, Family  Goal: Effective Coping  Outcome: Goal Ongoing  Goal: Communicate with support staff  Outcome: Goal Ongoing  Goal: Knowledge of positive coping methods  Outcome: Goal Ongoing     Problem: Social Interaction - Impaired  Goal: Increased Social Interaction  Outcome: Goal Ongoing  Goal: Knowledge of social interaction  Outcome: Goal Ongoing     Problem: Discharge Planning  Goal: Participation in plan of care  Outcome: Goal Ongoing

## 2021-05-25 NOTE — Progress Notes
Psychiatry Progress Note  Name: Raynold Blankenbaker          MRN: [id number] DOB: February 21, 1996         Age: 25 y.o.  Admission Date: 03/16/2021  LOS: 69 days    Service: [facility] Adult [facility] 1    Active Problems:    Schizoaffective disorder, bipolar type (HCC)    Tobacco use disorder, severe, dependence    Withdrawal from methamphetamine (HCC)    Methamphetamine use disorder, moderate (HCC)    PLAN  1. Continue current medication.      REASONS FOR CONTINUED HOSPITALIZATION  Psychiatric stabilization and medication management. Patient with decreased social support and at risk for recidivism if discharged at this time.       ______________________________________________________________  SUBJECTIVE  Patient seen for follow up. Patient reports his mood as good, denies significant issues with sleep, appetite or energy. Denies SI/HI/AH/VH.    REVIEW OF SYSTEMS  Review of Systems   Constitutional: Negative for appetite change.   Respiratory: Negative for shortness of breath.    Cardiovascular: Negative for chest pain.   Gastrointestinal: Negative for nausea and vomiting.   Psychiatric/Behavioral: Negative for hallucinations and suicidal ideas.     ______________________________________________________________  OBJECTIVE                 Vital Signs:  Current                Vital Signs: 24 Hour Range   BP: 103/53 (07/26 0700)  Temp: 36.7 ?C (98.1 ?F) (07/26 0700)  Pulse: 60 (07/26 0700)  Respirations: 16 PER MINUTE (07/26 0700)  SpO2: 96 % (07/26 0700) BP: (103-126)/(53-78)   Temp:  [36.7 ?C (98.1 ?F)-37.1 ?C (98.8 ?F)]   Pulse:  [60-98]   Respirations:  [16 PER MINUTE]   SpO2:  [96 %]    Intensity Pain Scale (Self Report): (not recorded)      Scheduled Medications:  divalproex (DEPAKOTE ER) ER tablet 2,500 mg, 2,500 mg, Oral, QHS  nicotine (NICODERM CQ STEP 2) 14 mg/day patch 1 patch, 1 patch, Transdermal, QDAY  paliperidone palmitate (INVEGA) (+) injection 156 mg, 156 mg, Intramuscular, Q28 Days (4 weeks)        PRN Medications:  acetaminophen Q6H PRN, calcium carbonate TID PRN, nicotine polacrilex Q2H PRN 4 mg at 05/24/21 2101, OLANZapine Q6H PRN **OR** OLANZapine Q6H PRN, polyethylene glycol 3350 QDAY PRN, traZODone QHS PRN 50 mg at 05/10/21 2129    Mental Status Exam:    General/Constitutional: adult male  Speech: normal rate, rhythm, volume, and tone  Thought Process: linear and goal directed  Thought Content: Denies SI/HI  Perception: denies AH/VH  Associations: intact  Mood: good  Affect: euthymic  Insight/Judgement: partial/fair    Orientation: grossly intact  Recent and remote memory: intact  Attention span and concentration: appropriate  Language: fluent, English speaker  [employer] of knowledge and vocabulary: appropriate    Focused Physical Exam:  Musculoskeletal: normal gait      ______________________________________________________________  Alyce Pagan, DO

## 2021-05-25 NOTE — Behavioral Health Treatment Team
TREATMENT TEAM NOTE  Name: Jamie [name]        MRN: [id number]          DOB: 1995-11-10            Age: 25 y.o.  Admission Date: 03/16/2021       LOS: 69 days    Date of Service: 05/25/2021        Attendees:  UR: Ival Bible, LMSW  Nursing: Pearline Cables RN  Therapy: Clement Husbands, LSCSW, [facility]  Therapy: Marijean Heath, LMLP, LCP  CM: Neysa Bonito, LPC  CM: Eden Lathe, [facility]  Activities Coordinator: Philippa Chester  Pharmacy: Shelda Altes, PharmD  Psychology: Assunta Gambles. Lowry Bowl, PhD  Psychiatry: Shella Maxim. Sherrine Maples, DO  Psychiatry: Frankey Poot, APRN-NP        Significant Points Discussed: No update    Treatment Plan Discussion: No update

## 2021-05-26 NOTE — Care Plan
Patient spent the evening in the day room watching television and socializing with peers. Patient calm, cooperative, and follows commands. Patient medication compliant . Patient has no medical concerns at this time. Will continue to monitor per protocol.       Problem: Health Maintenance - Impaired  Goal: Establish therapeutic relationships  Outcome: Goal Ongoing  Goal: Knowledge of health maintenance  Outcome: Goal Ongoing     Problem: Coping - Ineffective, Family  Goal: Effective Coping  Outcome: Goal Ongoing  Goal: Communicate with support staff  Outcome: Goal Ongoing  Goal: Knowledge of positive coping methods  Outcome: Goal Ongoing     Problem: Social Interaction - Impaired  Goal: Increased Social Interaction  Outcome: Goal Ongoing  Goal: Knowledge of social interaction  Outcome: Goal Ongoing     Problem: Discharge Planning  Goal: Participation in plan of care  Outcome: Goal Ongoing

## 2021-05-26 NOTE — Progress Notes
PSYCHIATRIC PROGRESS NOTE     LOS: 70 days  Voluntary/Involuntary Status: Voluntary  Guardian? Yes  Jamie, [name] (Mother)   [phone]   Jamie, [name] (Father)  [phone]     MEDICATIONS   Current Psychotropic Medications:   1. [name] IM q4weeks, last dose 05/18/21  2. Depakote ER 2500 mg QHS for mood stability     ASSESSMENT & DIAGNOSES   Jamie [name] is a 25 y.o. Caucasian adult with a history of schizoaffective disorder and polysubstance abuse who presented to [facility] as a transfer from OSH with manic symptoms in context of methamphetamine use. This appears to be precipitated by methamphetamine use. Factors that seem to have predisposed him to mania include schizoaffective disorder, substance abuse and uncle with schizophrenia. This current problem is maintained by ongoing substance abuse, lack of consistent medication compliance, and poor insight. However, protective factors include good support from parents who are guardians, motivation to seek treatment, and fair insight into current situation. Proposed treatment will consist of substance detoxification and medication management.    DSM-5 DIAGNOSES:  1. Stimulant use disorder, methamphetamine type, moderate  2. Methamphetamine withdrawal, resolved  3. Schizoaffective disorder, bipolar type  4. Tobacco use disorder (chewing tobacco), severe  5. Stimulant use disorder, cocaine type, unspecified severity     PLAN     ? No indication for changes to psychotropic medications on 05/26/21.   ? Received Invega Sustenna 156 mg IM on 05/18/21.    ? Continue Depakote 2500mg  QHS.  ? PRNs for agitation, anxiety, sleep  ? Level 1 CARES assessment completed 6/8. Level 1 recommended Level 2 assessment. Level 2 completed 7/11.   ? SW interventions, has placement after level 2 processed.     Seen and discussed with Dr. Sherrine Maples        Metabolic labs completed on:  Lipid Panel:   LDL   Date Value Ref Range Status   03/19/2021 106 (H) <100 mg/dL Final     HDL Date Value Ref Range Status   03/19/2021 55 >40 MG/DL Final     VLDL   Date Value Ref Range Status   03/19/2021 15 MG/DL Final     Cholesterol   Date Value Ref Range Status   03/19/2021 174 <200 MG/DL Final     Triglycerides   Date Value Ref Range Status   03/19/2021 76 <150 MG/DL Final       Blood Glucose:  Hemoglobin A1C   Date Value Ref Range Status   03/19/2021 5.3 4.0 - 6.0 % Final     Comment:     The ADA recommends that most patients with type 1 and type 2 diabetes maintain   an A1c level <7%.       ______________________________________________________________     SUBJECTIVE     Patient seen for follow up this morning. Jamie [name] reports good mood today. Denies any concerns. Reports mild difficulty sleeping last night. Endorses adequate appetite and energy. Adherent to medications and treatment. Denies SI/HI/AVH     REVIEW OF SYSTEMS     Review of Systems   Respiratory: Negative for shortness of breath.    Cardiovascular: Negative for chest pain.   Psychiatric/Behavioral: Negative for suicidal ideas. The patient does not have insomnia.         OBJECTIVE                    Vital Signs:  Current  Vital Signs: 24 Hour Range   BP: 105/61 (07/27 0800)  Temp: 37 ?C (98.6 ?F) (07/27 0800)  Pulse: 86 (07/27 0800)  Respirations: 18 PER MINUTE (07/27 0800)  SpO2: 97 % (07/27 0800) BP: (105-120)/(61)   Temp:  [37 ?C (98.6 ?F)]   Pulse:  [75-86]   Respirations:  [18 PER MINUTE]   SpO2:  [97 %-98 %]    Intensity Pain Scale (Self Report): (not recorded)      Sleep:  Hours of Sleep: 6.25   Quality of Sleep: Resting Quietly    Scheduled Medications:  divalproex (DEPAKOTE ER) ER tablet 2,500 mg, 2,500 mg, Oral, QHS  nicotine (NICODERM CQ STEP 2) 14 mg/day patch 1 patch, 1 patch, Transdermal, QDAY  paliperidone palmitate (INVEGA) (+) injection 156 mg, 156 mg, Intramuscular, Q28 Days (4 weeks)        PRN Medications:  acetaminophen Q6H PRN, calcium carbonate TID PRN, nicotine polacrilex Q2H PRN 4 mg at 05/25/21 2049, OLANZapine Q6H PRN **OR** OLANZapine Q6H PRN, polyethylene glycol 3350 QDAY PRN, traZODone QHS PRN 50 mg at 05/10/21 2129    Mental Status Exam:  ? General/Constitutional: 25 y.o. adult, appears stated age, wearing personal attire and hospital attire. moderately groomed.  ? Behavior: Overall calm, cooperative, appropriate for conversation and lying in bed  ? Speech/Motor: regular in rate, rhythm, volume, frequency. normal tone and good articulation  ? Eye Contact: good  ? Mood: good  ? Affect: mood congurent. euthymic  ? Thought Process: linear and goal directed  ? Associations: Intact  ? Thought Content: denies suicidal ideations.  denies homicidal ideations.   ? Perception: endorses chronic visual Hallucinations, denies auditory Hallucinations. Doesn't appear to be responding to internal stimuli  ? Insight/Judgment: fair/fair    ? Orientation: appears alert and oriented, though not formally evaluated  ? Recent and Remote Memory: Intact  ? Attention span and concentration: appropriate for conversation  ? Language: fluent, English  ? Fund of knowledge and vocabulary: average    Focused Physical Exam:  ? Neurological: no tic or tremor    ______________________________________________________________  Wilma Flavin, MD

## 2021-05-26 NOTE — Behavioral Health Treatment Team
TREATMENT TEAM NOTE  Name: Jamie [name]        MRN: [id number]          DOB: 04/15/1996            Age: 25 y.o.  Admission Date: 03/16/2021       LOS: 70 days    Date of Service: 05/26/2021        Attendees:  UR: Ival Bible, LMSW  Nursing: Doristine Mango, RN  Therapy: Clement Husbands, LSCSW, [facility]  Therapy: Marijean Heath, LMLP, LCP  CM: Henderson Cloud, LMSW  CM: Neysa Bonito, LPC  CM: Eden Lathe, LPC  Rec Therapy: Clare Charon, CTRS  Pharmacy: Shelda Altes, PharmD  Psychology: Assunta Gambles. Lowry Bowl, PhD  Psychiatry: Shella Maxim. Glenn, DO         Significant Points Discussed: No updates, did not attend groups.    Treatment Plan Discussion: Continue treament

## 2021-05-26 NOTE — Progress Notes
0745  Patient was calm, cooperative and compliant. He denied SI/HI/AH/pain/paranoia. Patient denied VH. Patient shared that he slept well last night. He has had appropriate interactions with staff and peers and is able to state his needs adequately.

## 2021-05-26 NOTE — Progress Notes
Chart check completed.

## 2021-05-26 NOTE — Care Plan
Problem: Health Maintenance - Impaired  Goal: Establish therapeutic relationships  Outcome: Goal Ongoing  Goal: Knowledge of health maintenance  Outcome: Goal Ongoing     Problem: Coping - Ineffective, Family  Goal: Effective Coping  Outcome: Goal Ongoing  Goal: Communicate with support staff  Outcome: Goal Ongoing  Goal: Knowledge of positive coping methods  Outcome: Goal Ongoing     Problem: Social Interaction - Impaired  Goal: Increased Social Interaction  Outcome: Goal Ongoing  Goal: Knowledge of social interaction  Outcome: Goal Ongoing     Problem: Discharge Planning  Goal: Participation in plan of care  Outcome: Goal Ongoing

## 2021-05-27 NOTE — Care Plan
Problem: Coping - Ineffective, Family  Goal: Communicate with support staff  Outcome: Goal Ongoing     Problem: Discharge Planning  Goal: Participation in plan of care  Outcome: Goal Ongoing

## 2021-05-27 NOTE — Progress Notes
Chart check completed.

## 2021-05-27 NOTE — Progress Notes
A medication education group was performed today, and Lawrnce Reyez attended the entire duration of the group.  They frequently participated in group and demonstrated good understanding of material discussed.    A psychotropic bingo game was played with the group. Common psychiatric medications were reviewed for indications, side effects, and onset of effect.     The patient did not have specific questions.    Will discuss with team in rounds.    Group Leader:  Marijo File, PHARMD

## 2021-05-27 NOTE — Progress Notes
Pt was seen in the milieu throughout the evening interacting with peers, playing card games and walking. Pt denied depression, anxiety, SI, HI, AVH and pain. Pt shared with RN he was doing "even better" because he found out the place he was going to would have people his age. Pt compliant with prescribed HS medications and received PRN nicotine gum. Pt will continue to be monitored per protocol. Pt encouraged to come to staff with questions or concerns.

## 2021-05-27 NOTE — Behavioral Health Treatment Team
TREATMENT TEAM NOTE  Name: Jordell Outten          MRN: [id number]              DOB: 08/08/1996          Age: 25 y.o.  Admission Date: 03/16/2021                 Attendees:   UR: Ival Bible, LMSW  Nursing: Doristine Mango, RN  Therapy: Clement Husbands, LSCSW, [facility]  Therapy: Marijean Heath, LMLP, LCP  CM: Henderson Cloud, LMSW  CM: Neysa Bonito, [facility]  Art Therapy: Judi Cong, Lorrin Jackson, [facility]  Pharmacy: Shelda Altes, PharmD  Psychology: Assunta Gambles. Lowry Bowl, PhD  Psychology: Peter Minium, PhD  Psychiatry: Shella Maxim. Sherrine Maples, DO  Psychiatry: Frankey Poot, APRN-NP    Significant Points Discussed: No update    Potential Discharge Date: Continue to monitor

## 2021-05-27 NOTE — Progress Notes
PSYCHIATRIC PROGRESS NOTE     LOS: 71 days  Voluntary/Involuntary Status: Voluntary  Guardian? Yes  Deric, Bocock (Mother)   [phone]   Arash, Karstens (Father)  [phone]     MEDICATIONS   Current Psychotropic Medications:   1. [name] IM q4weeks, last dose 05/18/21  2. Depakote ER 2500 mg QHS for mood stability     ASSESSMENT & DIAGNOSES     Jamie [name] is a 25 y.o. Caucasian adult with a history of schizoaffective disorder and polysubstance abuse who presented to [facility] as a transfer from OSH with manic symptoms in context of methamphetamine use. This appears to be precipitated by methamphetamine use. Factors that seem to have predisposed him to mania include schizoaffective disorder, substance abuse and uncle with schizophrenia. This current problem is maintained by ongoing substance abuse, lack of consistent medication compliance, and poor insight. However, protective factors include good support from parents who are guardians, motivation to seek treatment, and fair insight into current situation. Proposed treatment will consist of substance detoxification and medication management.    DSM-5 DIAGNOSES:  1. Schizoaffective disorder, bipolar type  2. Stimulant use disorder, methamphetamine type, moderate  3. Methamphetamine withdrawal, resolved  4. Tobacco use disorder (chewing tobacco), severe  5. Stimulant use disorder, cocaine type, unspecified severity     PLAN     ? No indication for changes to psychotropic medications on 05/27/21.   ? Received Invega Sustenna 156 mg IM on 05/18/21.    ? Continue Depakote 2500mg  QHS.  ? PRNs for agitation, anxiety, sleep  ? Level 1 CARES assessment completed 6/8. Level 1 recommended Level 2 assessment. Level 2 completed 7/11.   ? SW interventions, has placement after level 2 processed.     Seen and discussed with Dr. Sherrine Maples        Metabolic labs completed on:  Lipid Panel:   LDL   Date Value Ref Range Status   03/19/2021 106 (H) <100 mg/dL Final     HDL Date Value Ref Range Status   03/19/2021 55 >40 MG/DL Final     VLDL   Date Value Ref Range Status   03/19/2021 15 MG/DL Final     Cholesterol   Date Value Ref Range Status   03/19/2021 174 <200 MG/DL Final     Triglycerides   Date Value Ref Range Status   03/19/2021 76 <150 MG/DL Final       Blood Glucose:  Hemoglobin A1C   Date Value Ref Range Status   03/19/2021 5.3 4.0 - 6.0 % Final     Comment:     The ADA recommends that most patients with type 1 and type 2 diabetes maintain   an A1c level <7%.       ______________________________________________________________     SUBJECTIVE     Patient seen for follow up this morning. He reports good mood today. Denies any concerns. Sleeping well. Endorses adequate appetite and energy. Adherent to medications and treatment. Denies SI/HI/AVH. No questions today.     REVIEW OF SYSTEMS     Review of Systems   Respiratory: Negative for shortness of breath.    Cardiovascular: Negative for chest pain.   Psychiatric/Behavioral: Negative for suicidal ideas. The patient does not have insomnia.         OBJECTIVE                    Vital Signs:  Current  Vital Signs: 24 Hour Range   BP: 107/72 (07/28 0700)  Temp: 36.5 ?C (97.7 ?F) (07/28 0700)  Pulse: 84 (07/28 0700)  Respirations: 16 PER MINUTE (07/28 0700)  SpO2: 98 % (07/28 0700) BP: (107-130)/(72-88)   Temp:  [36.5 ?C (97.7 ?F)-37.2 ?C (98.9 ?F)]   Pulse:  [84-92]   Respirations:  [16 PER MINUTE-18 PER MINUTE]   SpO2:  [97 %-98 %]    Intensity Pain Scale (Self Report): (not recorded)      Sleep:  Hours of Sleep: 5   Quality of Sleep: Resting Quietly    Scheduled Medications:  divalproex (DEPAKOTE ER) ER tablet 2,500 mg, 2,500 mg, Oral, QHS  nicotine (NICODERM CQ STEP 2) 14 mg/day patch 1 patch, 1 patch, Transdermal, QDAY  paliperidone palmitate (INVEGA) (+) injection 156 mg, 156 mg, Intramuscular, Q28 Days (4 weeks)        PRN Medications:  acetaminophen Q6H PRN, calcium carbonate TID PRN, nicotine polacrilex Q2H PRN 4 mg at 05/26/21 2038, OLANZapine Q6H PRN **OR** OLANZapine Q6H PRN, polyethylene glycol 3350 QDAY PRN, traZODone QHS PRN 50 mg at 05/10/21 2129    Mental Status Exam:  ? General/Constitutional: 25 y.o. adult, appears stated age, wearing personal attire and hospital attire. moderately groomed.  ? Behavior: Overall calm, cooperative, appropriate for conversation and lying in bed  ? Speech/Motor: regular in rate, rhythm, volume, frequency. normal tone and good articulation  ? Eye Contact: good  ? Mood: good  ? Affect: mood congurent. euthymic  ? Thought Process: linear and goal directed  ? Associations: Intact  ? Thought Content: denies suicidal ideations.  denies homicidal ideations.   ? Perception: endorses chronic visual Hallucinations, denies auditory Hallucinations. Doesn't appear to be responding to internal stimuli  ? Insight/Judgment: fair/fair    ? Orientation: appears alert and oriented, though not formally evaluated  ? Recent and Remote Memory: Intact  ? Attention span and concentration: appropriate for conversation  ? Language: fluent, English  ? Fund of knowledge and vocabulary: average    Focused Physical Exam:  ? Neurological: no tic or tremor   ? Respiratory: unlabored respirations   ______________________________________________________________  Wilma Flavin, MD

## 2021-05-28 NOTE — Progress Notes
Chart check completed.

## 2021-05-28 NOTE — Care Plan
Problem: Health Maintenance - Impaired  Goal: Establish therapeutic relationships  Outcome: Goal Ongoing  Goal: Knowledge of health maintenance  Outcome: Goal Ongoing     Problem: Coping - Ineffective, Family  Goal: Effective Coping  Outcome: Goal Ongoing  Goal: Communicate with support staff  Outcome: Goal Ongoing  Goal: Knowledge of positive coping methods  Outcome: Goal Ongoing     Problem: Social Interaction - Impaired  Goal: Increased Social Interaction  Outcome: Goal Ongoing  Goal: Knowledge of social interaction  Outcome: Goal Ongoing     Problem: Discharge Planning  Goal: Participation in plan of care  Outcome: Goal Ongoing

## 2021-05-28 NOTE — Care Plan
Problem: Health Maintenance - Impaired  Goal: Knowledge of health maintenance  Outcome: Goal Ongoing     Problem: Coping - Ineffective, Family  Goal: Communicate with support staff  Outcome: Goal Ongoing     Problem: Discharge Planning  Goal: Participation in plan of care  Outcome: Goal Ongoing

## 2021-05-28 NOTE — Progress Notes
2353  Patient was calm, cooperative and compliant. Patient denied all psych/pain/paranoia. He has had appropriate interactions with staff and peers and is able to state his needs adequately.

## 2021-05-28 NOTE — Behavioral Health Treatment Team
TREATMENT TEAM NOTE  Name: Clemmie Marxen        MRN: [id number]          DOB: Jul 17, 1996            Age: 25 y.o.  Admission Date: 03/16/2021       LOS: 72 days    Date of Service: 05/28/2021        Attendees:  UR: Ival Bible, LMSW  Nursing: Flonnie Overman, RN  Therapy: Clement Husbands, [place], [facility]  Therapy: Marijean Heath, LMLP, LCP  CM: Neysa Bonito, LPC  CM: Eden Lathe, [facility]  Activities Coordinator: Philippa Chester  Pharmacy: Shelda Altes, PharmD  Psychiatry: Shella Maxim. Sherrine Maples, DO  Psychiatry: Frankey Poot, APRN-NP  Psychiatry Resident: Wilma Flavin, MD  Psychiatry Resident: Doylene Bode, MD         Significant Points Discussed: No update    Treatment Plan Discussion: No update

## 2021-05-28 NOTE — Progress Notes
PSYCHIATRIC PROGRESS NOTE     LOS: 72 days  Voluntary/Involuntary Status: Voluntary  Guardian? Yes  Nirav, Semmler (Mother)   [phone]   Jvaughn, [place]antassell (Father)  [phone]     MEDICATIONS   Current Psychotropic Medications:   1. [name] IM q4weeks, last dose 05/18/21  2. Depakote ER 2500 mg QHS for mood stability     ASSESSMENT & DIAGNOSES     Jamie [name] is a 25 y.o. Caucasian adult with a history of schizoaffective disorder and polysubstance abuse who presented to [facility] as a transfer from OSH with manic symptoms in context of methamphetamine use. This appears to be precipitated by methamphetamine use. Factors that seem to have predisposed him to mania include schizoaffective disorder, substance abuse and uncle with schizophrenia. This current problem is maintained by ongoing substance abuse, lack of consistent medication compliance, and poor insight. However, protective factors include good support from parents who are guardians, motivation to seek treatment, and fair insight into current situation. Proposed treatment will consist of substance detoxification and medication management.    DSM-5 DIAGNOSES:  1. Schizoaffective disorder, bipolar type  2. Stimulant use disorder, methamphetamine type, moderate  3. Methamphetamine withdrawal, resolved  4. Tobacco use disorder (chewing tobacco), severe  5. Stimulant use disorder, cocaine type, unspecified severity     PLAN     ? No indication for changes to psychotropic medications on 05/28/21.   ? Received Invega Sustenna 156 mg IM on 05/18/21.    ? Continue Depakote 2500mg  QHS.  ? PRNs for agitation, anxiety, sleep  ? Level 1 CARES assessment completed 6/8. Level 1 recommended Level 2 assessment. Level 2 completed 7/11.   ? SW interventions, has placement after level 2 processed.     Seen and discussed with Dr. Sherrine Maples        Metabolic labs completed on:  Lipid Panel:   LDL   Date Value Ref Range Status   03/19/2021 106 (H) <100 mg/dL Final     HDL Date Value Ref Range Status   03/19/2021 55 >40 MG/DL Final     VLDL   Date Value Ref Range Status   03/19/2021 15 MG/DL Final     Cholesterol   Date Value Ref Range Status   03/19/2021 174 <200 MG/DL Final     Triglycerides   Date Value Ref Range Status   03/19/2021 76 <150 MG/DL Final       Blood Glucose:  Hemoglobin A1C   Date Value Ref Range Status   03/19/2021 5.3 4.0 - 6.0 % Final     Comment:     The ADA recommends that most patients with type 1 and type 2 diabetes maintain   an A1c level <7%.       ______________________________________________________________     SUBJECTIVE     Patient seen for follow up this morning. He reports good mood today. Denies any concerns. Sleeping well. Endorses adequate appetite and energy. Adherent to medications and treatment. Denies SI/HI/AVH. No questions today. He inquires about discharge.     REVIEW OF SYSTEMS     Review of Systems   Respiratory: Negative for shortness of breath.    Cardiovascular: Negative for chest pain.   Psychiatric/Behavioral: Negative for suicidal ideas. The patient does not have insomnia.         OBJECTIVE                    Vital Signs:  Current  Vital Signs: 24 Hour Range   BP: 103/53 (07/29 0715)  Temp: 36 ?C (96.8 ?F) (07/29 0715)  Pulse: 63 (07/29 0715)  Respirations: 15 PER MINUTE (07/29 0715)  SpO2: 95 % (07/29 0715) BP: (103-129)/(53-66)   Temp:  [36 ?C (96.8 ?F)-36.8 ?C (98.2 ?F)]   Pulse:  [63-86]   Respirations:  [15 PER MINUTE-18 PER MINUTE]   SpO2:  [95 %-97 %]    Intensity Pain Scale (Self Report): (not recorded)      Sleep:  Hours of Sleep: 5.5   Quality of Sleep: Resting Quietly    Scheduled Medications:  divalproex (DEPAKOTE ER) ER tablet 2,500 mg, 2,500 mg, Oral, QHS  nicotine (NICODERM CQ STEP 2) 14 mg/day patch 1 patch, 1 patch, Transdermal, QDAY  paliperidone palmitate (INVEGA) (+) injection 156 mg, 156 mg, Intramuscular, Q28 Days (4 weeks)        PRN Medications:  acetaminophen Q6H PRN, calcium carbonate TID PRN, nicotine polacrilex Q2H PRN 4 mg at 05/27/21 2007, OLANZapine Q6H PRN **OR** OLANZapine Q6H PRN, polyethylene glycol 3350 QDAY PRN, traZODone QHS PRN 50 mg at 05/10/21 2129    Mental Status Exam:  ? General/Constitutional: 25 y.o. adult, appears stated age, wearing personal attire and hospital attire. moderately groomed.  ? Behavior: Overall calm, cooperative, appropriate for conversation and lying in bed  ? Speech/Motor: regular in rate, rhythm, volume, frequency. normal tone and good articulation  ? Eye Contact: good  ? Mood: good  ? Affect: mood congurent. euthymic  ? Thought Process: linear and goal directed  ? Associations: Intact  ? Thought Content: denies suicidal ideations.  denies homicidal ideations.   ? Perception: endorses chronic visual Hallucinations, denies auditory Hallucinations. Doesn't appear to be responding to internal stimuli  ? Insight/Judgment: fair/fair    ? Orientation: appears alert and oriented, though not formally evaluated  ? Recent and Remote Memory: Intact  ? Attention span and concentration: appropriate for conversation  ? Language: fluent, English  ? Fund of knowledge and vocabulary: average    Focused Physical Exam:  ? Neurological: no tic or tremor   ? Respiratory: unlabored respirations   ______________________________________________________________  Wilma Flavin, MD

## 2021-05-28 NOTE — Progress Notes
Pt was present in the milieu throughout the evening. Pt was seen walking, interacting with peers and watching TV. Pt denied depression, anxiety, SI, HI, AVH and pain. Pt compliant with prescribed HS medication and received PRN nicotine gum. Pt will continue to be monitored per protocol. Pt encouraged to come to staff with questions or concerns.

## 2021-05-29 NOTE — Progress Notes
Chart check completed.

## 2021-05-29 NOTE — Care Plan
Pt is calm, cooperative and compliant.  Denies SI AVH.  Attends offered groups and participates in group activities.      Problem: Health Maintenance - Impaired  Goal: Establish therapeutic relationships  Outcome: Goal Ongoing  Goal: Knowledge of health maintenance  Outcome: Goal Ongoing     Problem: Coping - Ineffective, Family  Goal: Effective Coping  Outcome: Goal Ongoing  Goal: Communicate with support staff  Outcome: Goal Ongoing  Goal: Knowledge of positive coping methods  Outcome: Goal Ongoing     Problem: Social Interaction - Impaired  Goal: Increased Social Interaction  Outcome: Goal Ongoing  Goal: Knowledge of social interaction  Outcome: Goal Ongoing     Problem: Discharge Planning  Goal: Participation in plan of care  Outcome: Goal Ongoing

## 2021-05-29 NOTE — Progress Notes
PSYCHIATRIC PROGRESS NOTE     LOS: 73 days  Voluntary/Involuntary Status: Voluntary  Guardian? Yes  Walther, Sanagustin (Mother)   [phone]   Ousman, Dise (Father)  [phone]     MEDICATIONS   Current Psychotropic Medications:   1. [name] IM q4weeks, last dose 05/18/21  2. Depakote ER 2500 mg QHS for mood stability     ASSESSMENT & DIAGNOSES     Jamie [name] is a 25 y.o. Caucasian adult with a history of schizoaffective disorder and polysubstance abuse who presented to [facility] as a transfer from OSH with manic symptoms in context of methamphetamine use. This appears to be precipitated by methamphetamine use. Factors that seem to have predisposed him to mania include schizoaffective disorder, substance abuse and uncle with schizophrenia. This current problem is maintained by ongoing substance abuse, lack of consistent medication compliance, and poor insight. However, protective factors include good support from parents who are guardians, motivation to seek treatment, and fair insight into current situation. Proposed treatment will consist of substance detoxification and medication management.    DSM-5 DIAGNOSES:  1. Schizoaffective disorder, bipolar type  2. Stimulant use disorder, methamphetamine type, moderate  3. Methamphetamine withdrawal, resolved  4. Tobacco use disorder (chewing tobacco), severe  5. Stimulant use disorder, cocaine type, unspecified severity     PLAN     ? No indication for changes to psychotropic medications on 05/29/21.   ? Received Invega Sustenna 156 mg IM on 05/18/21.    ? Continue Depakote 2500mg  QHS.  ? PRNs for agitation, anxiety, sleep  ? Level 1 CARES assessment completed 6/8. Level 1 recommended Level 2 assessment. Level 2 completed 7/11.   ? SW interventions, has placement after level 2 processed.     Seen and discussed with Dr. Izell Carolina        Metabolic labs completed on:  Lipid Panel:   LDL   Date Value Ref Range Status   03/19/2021 106 (H) <100 mg/dL Final     HDL Date Value Ref Range Status   03/19/2021 55 >40 MG/DL Final     VLDL   Date Value Ref Range Status   03/19/2021 15 MG/DL Final     Cholesterol   Date Value Ref Range Status   03/19/2021 174 <200 MG/DL Final     Triglycerides   Date Value Ref Range Status   03/19/2021 76 <150 MG/DL Final       Blood Glucose:  Hemoglobin A1C   Date Value Ref Range Status   03/19/2021 5.3 4.0 - 6.0 % Final     Comment:     The ADA recommends that most patients with type 1 and type 2 diabetes maintain   an A1c level <7%.       ______________________________________________________________     SUBJECTIVE     Patient seen for follow up this morning. He reports good mood today. Denies any concerns. Sleeping well. Endorses adequate appetite and energy. Adherent to medications and treatment. Denies SI/HI/AVH. No questions today.      REVIEW OF SYSTEMS     Review of Systems   Respiratory: Negative for shortness of breath.    Cardiovascular: Negative for chest pain.   Psychiatric/Behavioral: Negative for suicidal ideas. The patient does not have insomnia.         OBJECTIVE                    Vital Signs:  Current  Vital Signs: 24 Hour Range   BP: 109/59 (07/30 0730)  Temp: 36.4 ?C (97.5 ?F) (07/30 0730)  Pulse: 78 (07/30 0730)  Respirations: 18 PER MINUTE (07/30 0730)  SpO2: 98 % (07/30 0730) BP: (109-122)/(59-66)   Temp:  [36.4 ?C (97.5 ?F)-37 ?C (98.6 ?F)]   Pulse:  [78-88]   Respirations:  [18 PER MINUTE]   SpO2:  [98 %]    Intensity Pain Scale (Self Report): (not recorded)      Sleep:  Hours of Sleep: 6.75   Quality of Sleep: Resting Quietly    Scheduled Medications:  divalproex (DEPAKOTE ER) ER tablet 2,500 mg, 2,500 mg, Oral, QHS  nicotine (NICODERM CQ STEP 2) 14 mg/day patch 1 patch, 1 patch, Transdermal, QDAY  paliperidone palmitate (INVEGA) (+) injection 156 mg, 156 mg, Intramuscular, Q28 Days (4 weeks)        PRN Medications:  acetaminophen Q6H PRN, calcium carbonate TID PRN, nicotine polacrilex Q2H PRN 4 mg at 05/28/21 1941, OLANZapine Q6H PRN **OR** OLANZapine Q6H PRN, polyethylene glycol 3350 QDAY PRN, traZODone QHS PRN 50 mg at 05/10/21 2129    Mental Status Exam:  ? General/Constitutional: 25 y.o. adult, appears stated age, wearing personal attire and hospital attire. moderately groomed.  ? Behavior: Overall calm, cooperative, appropriate for conversation and lying in bed  ? Speech/Motor: regular in rate, rhythm, volume, frequency. normal tone and good articulation  ? Eye Contact: good  ? Mood: good  ? Affect: mood congurent. euthymic  ? Thought Process: linear and goal directed  ? Associations: Intact  ? Thought Content: denies suicidal ideations.  denies homicidal ideations.   ? Perception: endorses chronic visual Hallucinations, denies auditory Hallucinations. Doesn't appear to be responding to internal stimuli  ? Insight/Judgment: fair/fair    ? Orientation: appears alert and oriented, though not formally evaluated  ? Recent and Remote Memory: Intact  ? Attention span and concentration: appropriate for conversation  ? Language: fluent, English  ? Fund of knowledge and vocabulary: average    Focused Physical Exam:  ? Neurological: no tic or tremor   ? Respiratory: unlabored respirations   ______________________________________________________________  Raquel Massa Pe, MD

## 2021-05-30 NOTE — Progress Notes
Pt was present in the milieu throughout the evening. Pt denied depression, anxiety, SI, HI, AVH and pain. Pt shared with RN that he was doing great and had visitors today (grandparents and aunt). Pt compliant with prescribed HS medication and received nicotine gum. Pt will continue to be monitored per protocol. Pt encouraged to come to staff with questions or concerns.

## 2021-05-30 NOTE — Care Plan
Problem: Health Maintenance - Impaired  Goal: Knowledge of health maintenance  Outcome: Goal Ongoing     Problem: Coping - Ineffective, Family  Goal: Communicate with support staff  Outcome: Goal Ongoing     Problem: Discharge Planning  Goal: Participation in plan of care  Outcome: Goal Ongoing

## 2021-05-30 NOTE — Progress Notes
Chart check completed.

## 2021-05-30 NOTE — Care Plan
Patient on unit and interacts well with staff and peers. Mood and behavior are stable. He denies all psych. Minimal group attendance but participates on unit activities.      Problem: Health Maintenance - Impaired  Goal: Establish therapeutic relationships  Outcome: Goal Ongoing     Problem: Coping - Ineffective, Family  Goal: Effective Coping  Outcome: Goal Ongoing     Problem: Social Interaction - Impaired  Goal: Increased Social Interaction  Outcome: Goal Ongoing     Problem: Discharge Planning  Goal: Participation in plan of care  Outcome: Goal Ongoing

## 2021-05-30 NOTE — Progress Notes
PSYCHIATRIC PROGRESS NOTE     LOS: 74 days  Voluntary/Involuntary Status: Voluntary  Guardian? Yes  Estefan, Gilleland (Mother)   [phone]   Ransome, Benedix (Father)  [phone]     MEDICATIONS   Current Psychotropic Medications:   1. [name] IM q4weeks, last dose 05/18/21  2. Depakote ER 2500 mg QHS for mood stability     ASSESSMENT & DIAGNOSES     Jamie [name] is a 25 y.o. Caucasian adult with a history of schizoaffective disorder and polysubstance abuse who presented to [facility] as a transfer from OSH with manic symptoms in context of methamphetamine use. This appears to be precipitated by methamphetamine use. Factors that seem to have predisposed him to mania include schizoaffective disorder, substance abuse and uncle with schizophrenia. This current problem is maintained by ongoing substance abuse, lack of consistent medication compliance, and poor insight. However, protective factors include good support from parents who are guardians, motivation to seek treatment, and fair insight into current situation. Proposed treatment will consist of substance detoxification and medication management.    DSM-5 DIAGNOSES:  1. Schizoaffective disorder, bipolar type  2. Stimulant use disorder, methamphetamine type, moderate  3. Methamphetamine withdrawal, resolved  4. Tobacco use disorder (chewing tobacco), severe  5. Stimulant use disorder, cocaine type, unspecified severity     PLAN     ? No indication for changes to psychotropic medications on 05/30/21.   ? Invega Sustenna 156 mg IM; last on 05/18/21.    ? Depakote 2500mg  QHS  ? VPA level (5/25) 102  ? PRNs available for agitation, anxiety, sleep  ? Level 1 CARES assessment completed 6/8. Level 1 recommended Level 2 assessment. Level 2 completed 7/11.   ? SW interventions, has placement after level 2 processed.     Seen and discussed with Dr. Izell Carolina        Metabolic labs completed on:  Lipid Panel:   LDL   Date Value Ref Range Status   03/19/2021 106 (H) <100 mg/dL Final     HDL   Date Value Ref Range Status   03/19/2021 55 >40 MG/DL Final     VLDL   Date Value Ref Range Status   03/19/2021 15 MG/DL Final     Cholesterol   Date Value Ref Range Status   03/19/2021 174 <200 MG/DL Final     Triglycerides   Date Value Ref Range Status   03/19/2021 76 <150 MG/DL Final       Blood Glucose:  Hemoglobin A1C   Date Value Ref Range Status   03/19/2021 5.3 4.0 - 6.0 % Final     Comment:     The ADA recommends that most patients with type 1 and type 2 diabetes maintain   an A1c level <7%.       ______________________________________________________________     SUBJECTIVE     Groups: No     Patient interviewed laying in bed.  Patient reports feeling congested and with sore throat.  Denies any fever, headache, body aches, fever, and chills.    Reports mood as sick.  Denies SI HI.  Endorses baseline AVH.  Reports good sleep and appetite.     REVIEW OF SYSTEMS      Review of Systems   Constitutional: Positive for fever. Negative for chills, diaphoresis and malaise/fatigue.   HENT: Positive for congestion and sore throat.    Respiratory: Negative for cough and shortness of breath.    Cardiovascular: Negative for chest pain.   Gastrointestinal:  Negative for abdominal pain.   Neurological: Negative for headaches.   Psychiatric/Behavioral: Negative for hallucinations and suicidal ideas.          OBJECTIVE                    Vital Signs:  Current                Vital Signs: 24 Hour Range   BP: 119/53 (07/31 0712)  Temp: 36.6 ?C (97.9 ?F) (07/31 3016)  Pulse: 75 (07/31 0712)  Respirations: 18 PER MINUTE (07/31 0712)  SpO2: 97 % (07/31 0712) BP: (119-135)/(53-71)   Temp:  [36.6 ?C (97.9 ?F)-36.8 ?C (98.2 ?F)]   Pulse:  [65-75]   Respirations:  [18 PER MINUTE]   SpO2:  [97 %-98 %]    Intensity Pain Scale (Self Report): (not recorded)      Sleep:  Hours of Sleep: 6.25   Quality of Sleep: Resting Quietly    Scheduled Medications:  divalproex (DEPAKOTE ER) ER tablet 2,500 mg, 2,500 mg, Oral, QHS  nicotine (NICODERM CQ STEP 2) 14 mg/day patch 1 patch, 1 patch, Transdermal, QDAY  paliperidone palmitate (INVEGA) (+) injection 156 mg, 156 mg, Intramuscular, Q28 Days (4 weeks)        PRN Medications:  acetaminophen Q6H PRN, calcium carbonate TID PRN, nicotine polacrilex Q2H PRN 4 mg at 05/29/21 1930, OLANZapine Q6H PRN **OR** OLANZapine Q6H PRN, polyethylene glycol 3350 QDAY PRN, traZODone QHS PRN 50 mg at 05/10/21 2129    Mental Status Exam:  ? General/Constitutional: 25 y.o. adult, appears stated age, wearing personal attire and hospital attire. moderately groomed.  ? Behavior: Overall calm, cooperative, appropriate for conversation and lying in bed  ? Speech/Motor: regular in rate, rhythm, volume, frequency. normal tone and good articulation  ? Eye Contact: good  ? Mood: sick  ? Affect: mood congurent. euthymic  ? Thought Process: linear and goal directed  ? Associations: Intact  ? Thought Content: denies suicidal ideations.  denies homicidal ideations.   ? Perception: endorses chronic visual hallucinations, denies auditory hallucinations. Does not appear to be responding to internal stimuli  ? Insight/Judgment: fair/fair    ? Orientation: appears alert and oriented, though not formally evaluated  ? Recent and Remote Memory: Intact  ? Attention span and concentration: appropriate for conversation  ? Language: fluent, English  ? Fund of knowledge and vocabulary: average    Focused Physical Exam:  ? Neurological: no tic or tremor   ? Respiratory: unlabored respirations   ______________________________________________________________  Maura Crandall, DO

## 2021-05-31 MED ORDER — EUCALYPTUS-MENTHOL MM LOZG
1 | ORAL | 0 refills | Status: DC | PRN
Start: 2021-05-31 — End: 2021-06-11
  Administered 2021-05-31: 15:00:00 1 via ORAL

## 2021-05-31 NOTE — Behavioral Health Treatment Team
TREATMENT TEAM NOTE  Name: Jamie [name]        MRN: [id number]          DOB: [birth year]            Age: 25 y.o.  Admission Date: 03/16/2021       LOS: 75 days    Date of Service: 05/31/2021        Attendees:  UR: Ival Bible, LMSW  Nursing: Romilda Joy, BSN, RN  Therapy: Clement Husbands, LSCSW, [facility]  Lead Clinical Case Mangager: Eden Lathe, [facility]  Case Manager: Maryjane Hurter, LMSW  Activities Coordinator: Philippa Chester  Pharmacy: Dario Guardian  PharmD  Pharmacy Resident: Duwayne Heck Dauchot   Psychology: Peter Minium, PhD  Psychiatry: Shella Maxim. Sherrine Maples, DO  Psychiatry Resident: Aquilla Hacker, MD  Psychiatry Resident:Scott Rayvon Char, MD        Significant Points Discussed: Awaiting placement    Treatment Plan Discussion: Awaiting placement

## 2021-05-31 NOTE — Care Plan
Jamie [name] is compliant with medications.  Pt denies SIHISH.  Pt states he has visual hallucinations of floaters and red, blue and yellow dots.  Pt states it was hard to get to sleep r/t patient yelling in the red zone.  Pt states appetite is good. Pt states his mood is good r/t talking with staff.  Pt denies anxiety and depression. Pt denies AH.  Pt denies pain.  Pt encouraged to attend groups.  Jamie [name] denies any questions or concerns at this time.    Problem: Health Maintenance - Impaired  Goal: Establish therapeutic relationships  Outcome: Goal Ongoing  Goal: Knowledge of health maintenance  Outcome: Goal Ongoing     Problem: Coping - Ineffective, Family  Goal: Effective Coping  Outcome: Goal Ongoing  Goal: Communicate with support staff  Outcome: Goal Ongoing  Goal: Knowledge of positive coping methods  Outcome: Goal Ongoing     Problem: Social Interaction - Impaired  Goal: Increased Social Interaction  Outcome: Goal Ongoing  Goal: Knowledge of social interaction  Outcome: Goal Ongoing     Problem: Discharge Planning  Goal: Participation in plan of care  Outcome: Goal Ongoing

## 2021-05-31 NOTE — Care Plan
Pt has been calm, cooperative and compliant. Mood and behavior have been stable.  Med compliant, denies all psych. Will continue to monitor....      Problem: Health Maintenance - Impaired  Goal: Establish therapeutic relationships  Outcome: Goal Ongoing  Goal: Knowledge of health maintenance  Outcome: Goal Ongoing     Problem: Coping - Ineffective, Family  Goal: Effective Coping  Outcome: Goal Ongoing  Goal: Communicate with support staff  Outcome: Goal Ongoing  Goal: Knowledge of positive coping methods  Outcome: Goal Ongoing     Problem: Social Interaction - Impaired  Goal: Increased Social Interaction  Outcome: Goal Ongoing  Goal: Knowledge of social interaction  Outcome: Goal Ongoing     Problem: Discharge Planning  Goal: Participation in plan of care  Outcome: Goal Ongoing

## 2021-05-31 NOTE — Care Coordination-Inpatient
Care Coordination - Inpatient Note      Patient Name:   Jamie [name]                        MRN:  [id number]  Admission Date:  03/16/2021    11:10am  CM e-mailed [name] and Felix Ahmadi regarding update on Level II and the best option for communication with [facility]. Will continue to follow up for an update on the status of patient's Level II assessment.     Chidinma Clites  05/31/2021

## 2021-05-31 NOTE — Progress Notes
PSYCHIATRIC PROGRESS NOTE     LOS: 75 days  Voluntary/Involuntary Status: Voluntary  Guardian? Yes  Marcial, Pless (Mother)   [phone]   Camden, Knotek (Father)  [phone]     MEDICATIONS   Current Psychotropic Medications:   1. [name] IM q4weeks, last dose 05/18/21  2. Depakote ER 2500 mg QHS for mood stability     ASSESSMENT & DIAGNOSES     Jamie [name] is a 25 y.o. Caucasian adult with a history of schizoaffective disorder and polysubstance abuse who presented to [facility] as a transfer from OSH with manic symptoms in context of methamphetamine use. This appears to be precipitated by methamphetamine use. Factors that seem to have predisposed him to mania include schizoaffective disorder, substance abuse and uncle with schizophrenia. This current problem is maintained by ongoing substance abuse, lack of consistent medication compliance, and poor insight. However, protective factors include good support from parents who are guardians, motivation to seek treatment, and fair insight into current situation. Proposed treatment will consist of substance detoxification and medication management.    DSM-5 DIAGNOSES:  1. Schizoaffective disorder, bipolar type  2. Stimulant use disorder, methamphetamine type, moderate  3. Methamphetamine withdrawal, resolved  4. Tobacco use disorder (chewing tobacco), severe  5. Stimulant use disorder, cocaine type, unspecified severity     PLAN     ? No indication for changes to psychotropic medications on 05/31/21.   ? Invega Sustenna 156 mg IM; last on 05/18/21.    ? Depakote 2500mg  QHS  ? VPA level (5/25) 102  ? PRNs available for agitation, anxiety, sleep  ? Level 1 CARES assessment completed 6/8. Level 1 recommended Level 2 assessment. Level 2 completed 7/11.   ? SW interventions, has placement after level 2 processed.     Seen and discussed with Dr. Sherrine Maples        Metabolic labs completed on:  Lipid Panel:   LDL   Date Value Ref Range Status   03/19/2021 106 (H) <100 mg/dL Final     HDL   Date Value Ref Range Status   03/19/2021 55 >40 MG/DL Final     VLDL   Date Value Ref Range Status   03/19/2021 15 MG/DL Final     Cholesterol   Date Value Ref Range Status   03/19/2021 174 <200 MG/DL Final     Triglycerides   Date Value Ref Range Status   03/19/2021 76 <150 MG/DL Final       Blood Glucose:  Hemoglobin A1C   Date Value Ref Range Status   03/19/2021 5.3 4.0 - 6.0 % Final     Comment:     The ADA recommends that most patients with type 1 and type 2 diabetes maintain   an A1c level <7%.       ______________________________________________________________     SUBJECTIVE     Groups: No     Patient interviewed laying in bed.  Patient reports his sleep is ok and he slept 8 hours. His appetite and mood are both good. He reports that he thinks his medications are working well and they are well tolerated. Patient denies SI, HI and AH. When asked about VH, he describes floaters which are more likely visual disturbances than VH.      REVIEW OF SYSTEMS      Review of Systems   Constitutional: Negative for chills, diaphoresis and malaise/fatigue.   Respiratory: Negative for cough and shortness of breath.    Cardiovascular: Negative for chest pain.  Gastrointestinal: Negative for abdominal pain.   Neurological: Negative for headaches.   Psychiatric/Behavioral: Negative for hallucinations and suicidal ideas.        OBJECTIVE                    Vital Signs:  Current                Vital Signs: 24 Hour Range   BP: 120/53 (08/01 0727)  Temp: 36.3 ?C (97.3 ?F) (08/01 2725)  Pulse: 60 (08/01 0727)  Respirations: 18 PER MINUTE (08/01 0727)  SpO2: 97 % (08/01 0727) BP: (120-152)/(53-69)   Temp:  [36.3 ?C (97.3 ?F)-37.6 ?C (99.7 ?F)]   Pulse:  [60-98]   Respirations:  [18 PER MINUTE]   SpO2:  [97 %-100 %]    Intensity Pain Scale (Self Report): (not recorded)      Sleep:  Hours of Sleep: 5.50   Quality of Sleep: Resting Quietly    Scheduled Medications:  divalproex (DEPAKOTE ER) ER tablet 2,500 mg, 2,500 mg, Oral, QHS  nicotine (NICODERM CQ STEP 2) 14 mg/day patch 1 patch, 1 patch, Transdermal, QDAY  paliperidone palmitate (INVEGA) (+) injection 156 mg, 156 mg, Intramuscular, Q28 Days (4 weeks)      PRN Medications:  acetaminophen Q6H PRN, calcium carbonate TID PRN, eucalyptus-menthol Q2H PRN 1 lozenge at 05/31/21 1024, nicotine polacrilex Q2H PRN 4 mg at 05/31/21 1306, OLANZapine Q6H PRN **OR** OLANZapine Q6H PRN, polyethylene glycol 3350 QDAY PRN, traZODone QHS PRN 50 mg at 05/30/21 2315    Mental Status Exam:  ? General/Constitutional: 25 y.o. adult, appears stated age, wearing personal attire and hospital attire. moderately groomed.  ? Behavior: Overall calm, cooperative, appropriate for conversation and lying in bed  ? Speech/Motor: regular in rate, rhythm, volume, frequency. normal tone and good articulation  ? Eye Contact: good  ? Mood: sick  ? Affect: mood congurent. euthymic  ? Thought Process: linear and goal directed  ? Associations: Intact  ? Thought Content: denies suicidal ideations.  denies homicidal ideations.   ? Perception: endorses chronic visual hallucinations, denies auditory hallucinations. Does not appear to be responding to internal stimuli  ? Insight/Judgment: fair/fair    ? Orientation: appears alert and oriented, though not formally evaluated  ? Recent and Remote Memory: Intact  ? Attention span and concentration: appropriate for conversation  ? Language: fluent, English  ? Fund of knowledge and vocabulary: average    Focused Physical Exam:  ? Neurological: no tic or tremor   ? Respiratory: unlabored respirations   ______________________________________________________________  Aquilla Hacker, MD

## 2021-05-31 NOTE — Progress Notes
[facility]  Therapy Progress Note    NAME:Jamie [name] MRN: [id number] DOB:[birth year] AGE: 25 y.o.  ADMISSION DATE: 03/16/2021 DAYS ADMITTED: LOS: 75 days    Date of Service:  05/31/21    Active Problems:    Schizoaffective disorder, bipolar type (HCC)    Tobacco use disorder, severe, dependence    Withdrawal from methamphetamine (HCC)    Methamphetamine use disorder, moderate (HCC)    Objective:  Therapist approached Patient as he was walking up/down the hallway.  Eye contact WNL.  Affect full; mood positive.    Narrative:  Patient continues to be eager about discharge.  He talked about a recent family visit and said it went well.  He continues to process mistakes and decisions he regrets from the past.  Relevant lessons were identified.  Patient talked about the importance of having and maintaining structure in his daily routine.  Patient denied any significant new issues/concerns.    Follow up:  Therapist reinforced Patient's pleasant/calm demeanor and his positive attitude. Treatment Team will continue to follow.

## 2021-06-01 NOTE — Care Plan
Hensley denies SIHISH.  Pt has visual hallucinations of floaters and red, blue and green dots.  Pt denies AH.  Pt states slept okay and has a good appetite.  Pt states his mood is average r/t being here so long.  Pt denies anxiety and depression.  Pt denies pain.  Pt encouraged to attend groups.  Javoni denies any questions or concerns at this time.    Problem: Health Maintenance - Impaired  Goal: Establish therapeutic relationships  Outcome: Goal Ongoing  Goal: Knowledge of health maintenance  Outcome: Goal Ongoing     Problem: Coping - Ineffective, Family  Goal: Effective Coping  Outcome: Goal Ongoing  Goal: Communicate with support staff  Outcome: Goal Ongoing  Goal: Knowledge of positive coping methods  Outcome: Goal Ongoing     Problem: Social Interaction - Impaired  Goal: Increased Social Interaction  Outcome: Goal Ongoing  Goal: Knowledge of social interaction  Outcome: Goal Ongoing     Problem: Discharge Planning  Goal: Participation in plan of care  Outcome: Goal Ongoing

## 2021-06-01 NOTE — Progress Notes
Chart check completed

## 2021-06-01 NOTE — Care Plan
The pt was out in the milieu all evening. The pt was observed interacting with multiple peers in a positive manner. The pt went to all of the groups in the afternoon. Pt denies any SI or HI thoughts. The pt states he experiences floaters that he feels are VH. The pt denies any AH. The pt rates both Anxiety and Depression level at zero. The pt requested something to help him sleep. The pt received Trazodone 50 mg po at 2203. The pt is currently resting quietly.  Problem: Health Maintenance - Impaired  Goal: Establish therapeutic relationship   Outcome: Goal Ongoing     Problem: Coping - Ineffective, Family  Goal: Effective Coping  Outcome: Goal Ongoing     Problem: Social Interaction - Impaired  Goal: Increased Social Interaction  Outcome: Goal Ongoing     Problem: Discharge Planning  Goal: Participation in plan of care  Outcome: Goal Ongoing

## 2021-06-01 NOTE — Progress Notes
Psychiatry Progress Note  Name: Sabrina Arriaga          MRN: [id number] DOB: May 28, 1996         Age: 25 y.o.  Admission Date: 03/16/2021  LOS: 76 days    Service: [facility] 1    Active Problems:    Schizoaffective disorder, bipolar type (HCC)    Tobacco use disorder, severe, dependence    Withdrawal from methamphetamine (HCC)    Methamphetamine use disorder, moderate (HCC)    PLAN  1. Continue current medication.      REASONS FOR CONTINUED HOSPITALIZATION  Psychiatric stabilization and medication management. Patient with decreased social support and at risk for recidivism if discharged at this time.       ______________________________________________________________  SUBJECTIVE  Patient seen for follow up. Patient denies any issues at this time, good sleep, appetite, energy. Denies SI/HI/AH/VH.    REVIEW OF SYSTEMS  Review of Systems   Constitutional: Negative for appetite change.   Respiratory: Negative for shortness of breath.    Cardiovascular: Negative for chest pain.   Psychiatric/Behavioral: Negative for hallucinations and suicidal ideas.     ______________________________________________________________  OBJECTIVE                 Vital Signs:  Current                Vital Signs: 24 Hour Range   BP: 96/63 (08/02 0800)  Temp: 36.6 ?C (97.9 ?F) (08/02 0800)  Pulse: 80 (08/02 0800)  Respirations: 16 PER MINUTE (08/02 0800)  SpO2: 98 % (08/02 0800) BP: (96-138)/(63-71)   Temp:  [36.6 ?C (97.9 ?F)-37.1 ?C (98.8 ?F)]   Pulse:  [80-90]   Respirations:  [16 PER MINUTE-18 PER MINUTE]   SpO2:  [98 %]    Intensity Pain Scale (Self Report): (not recorded)      Scheduled Medications:  divalproex (DEPAKOTE ER) ER tablet 2,500 mg, 2,500 mg, Oral, QHS  nicotine (NICODERM CQ STEP 2) 14 mg/day patch 1 patch, 1 patch, Transdermal, QDAY  paliperidone palmitate (INVEGA) (+) injection 156 mg, 156 mg, Intramuscular, Q28 Days (4 weeks)        PRN Medications:  acetaminophen Q6H PRN, calcium carbonate TID PRN, eucalyptus-menthol Q2H PRN 1 lozenge at 05/31/21 1024, nicotine polacrilex Q2H PRN 4 mg at 05/31/21 1813, OLANZapine Q6H PRN **OR** OLANZapine Q6H PRN, polyethylene glycol 3350 QDAY PRN, traZODone QHS PRN 50 mg at 05/31/21 2203    Mental Status Exam:    General/Constitutional: adult male  Speech: normal rate, rhythm, volume, and tone  Thought Process: linear and goal directed  Thought Content: Denies SI/HI  Perception: denies AH/VH  Associations: intact  Mood: good  Affect: euthymic  Insight/Judgement: good/good    Orientation: grossly intact  Recent and remote memory: intact  Attention span and concentration: appropriate  Language: fluent, [place] speaker  [employer] of knowledge and vocabulary: appropriate    Focused Physical Exam:  Musculoskeletal: normal gait      ______________________________________________________________  Alyce Pagan, DO

## 2021-06-01 NOTE — Behavioral Health Treatment Team
TREATMENT TEAM NOTE  Name: Anmol Fleck        MRN: [id number]          DOB: Jan 16, 1996            Age: 25 y.o.  Admission Date: 03/16/2021       LOS: 76 days    Date of Service: 06/01/2021        Attendees:  UR: Ival Bible, LMSW  Nursing: Flonnie Overman RN  Therapy: Clement Husbands, [place], [facility]  Therapy: Marijean Heath, LMLP, LCP  CM: Neysa Bonito, LPC  CM: Eden Lathe, [facility]  Activities Coordinator: Philippa Chester  Nurse Manager: Ursula Beath, RN  Pharmacy: Shelda Altes, PharmD  Psychology: Assunta Gambles. Lowry Bowl, PhD  Psychiatry: Shella Maxim. Sherrine Maples, DO  Psychiatry: Frankey Poot, APRN-NP        Significant Points Discussed: CM continues to work on KDADS approval.     Treatment Plan Discussion: No update

## 2021-06-02 NOTE — Care Plan
Pt meeting with treatment team daily.  Patient denies pain, SI/HI/AVH.  Pt out in the milieu, watched tv, and interacting with peers.  Pt compliant with HS medication.  Pt has been calm and cooperative.  Discharge planning ongoing.        Problem: Health Maintenance - Impaired  Goal: Establish therapeutic relationships  Outcome: Goal Ongoing  Goal: Knowledge of health maintenance  Outcome: Goal Ongoing     Problem: Coping - Ineffective, Family  Goal: Effective Coping  Outcome: Goal Ongoing  Goal: Communicate with support staff  Outcome: Goal Ongoing  Goal: Knowledge of positive coping methods  Outcome: Goal Ongoing     Problem: Social Interaction - Impaired  Goal: Increased Social Interaction  Outcome: Goal Ongoing  Goal: Knowledge of social interaction  Outcome: Goal Ongoing     Problem: Discharge Planning  Goal: Participation in plan of care  Outcome: Goal Ongoing

## 2021-06-02 NOTE — Progress Notes
Chart check completed.

## 2021-06-02 NOTE — Behavioral Health Treatment Team
TREATMENT TEAM NOTE  Name: Inri Sobieski        MRN: [id number]          DOB: August 08, 1996            Age: 25 y.o.  Admission Date: 03/16/2021       LOS: 77 days    Date of Service: 06/02/2021        Attendees:  UR: Ival Bible, LMSW  Nursing: Doristine Mango, RN  Therapy: Clement Husbands, [place], [facility]  Therapy: Marijean Heath, LMLP, LCP  CM: Eden Lathe, LPC  Rec Therapy: Clare Charon, CTRS  Pharmacy: Shelda Altes, PharmD  Psychology: Assunta Gambles. Lowry Bowl, PhD  Psychiatry: Shella Maxim. Sherrine Maples, DO        Significant Points Discussed: No updates, patient not attending group but exercised with a staff.     Treatment Plan Discussion: Waiting for placement

## 2021-06-02 NOTE — Progress Notes
PSYCHIATRIC PROGRESS NOTE     LOS: 77 days  Voluntary/Involuntary Status: Voluntary  Guardian? Yes  Qadree, Florey (Mother)   [phone]   Abishai, [place] (Father)  [phone]     MEDICATIONS   Current Psychotropic Medications:   1. [name] IM q4weeks, last dose 05/18/21 for schizoaffective disorder, bipolar type  2. Depakote ER 2500 mg QHS for mood stability     ASSESSMENT & DIAGNOSES     Jamie [name] is a 25 y.o. Caucasian adult with a history of schizoaffective disorder and polysubstance abuse who presented to [facility] as a transfer from OSH with manic symptoms in context of methamphetamine use. This appears to be precipitated by methamphetamine use. Factors that seem to have predisposed him to mania include schizoaffective disorder, substance abuse and uncle with schizophrenia. This current problem is maintained by ongoing substance abuse, lack of consistent medication compliance, and poor insight. However, protective factors include good support from parents who are guardians, motivation to seek treatment, and fair insight into current situation. Proposed treatment will consist of substance detoxification and medication management.    DSM-5 DIAGNOSES:  1. Schizoaffective disorder, bipolar type  2. Stimulant use disorder, methamphetamine type, moderate  3. Methamphetamine withdrawal, resolved  4. Tobacco use disorder (chewing tobacco), severe  5. Stimulant use disorder, cocaine type, unspecified severity     PLAN     ? No indication for changes to psychotropic medications on 06/02/21.   ? Invega Sustenna 156 mg IM; last on 05/18/21.    ? Depakote 2500mg  QHS  ? VPA level (5/25) 102  ? PRNs available for agitation, anxiety, sleep  ? Level 1 CARES assessment completed 6/8. Level 1 recommended Level 2 assessment. Level 2 completed 7/11.   ? SW interventions, has placement after level 2 processed.     Seen and discussed with Dr. Sherrine Maples        Metabolic labs completed on:  Lipid Panel:   LDL   Date Value Ref Range Status   03/19/2021 106 (H) <100 mg/dL Final     HDL   Date Value Ref Range Status   03/19/2021 55 >40 MG/DL Final     VLDL   Date Value Ref Range Status   03/19/2021 15 MG/DL Final     Cholesterol   Date Value Ref Range Status   03/19/2021 174 <200 MG/DL Final     Triglycerides   Date Value Ref Range Status   03/19/2021 76 <150 MG/DL Final       Blood Glucose:  Hemoglobin A1C   Date Value Ref Range Status   03/19/2021 5.3 4.0 - 6.0 % Final     Comment:     The ADA recommends that most patients with type 1 and type 2 diabetes maintain   an A1c level <7%.       ______________________________________________________________     SUBJECTIVE     Groups: No     Patient interviewed laying in bed. Patient reports his sleep is pretty good. His appetite and mood are both good. He denies any physical symptoms or complaints. Patient denies SI, HI and AH.      REVIEW OF SYSTEMS      Review of Systems   Constitutional: Negative for chills, diaphoresis and malaise/fatigue.   Respiratory: Negative for cough and shortness of breath.    Cardiovascular: Negative for chest pain.   Gastrointestinal: Negative for abdominal pain.   Neurological: Negative for headaches.   Psychiatric/Behavioral: Negative for hallucinations and suicidal ideas.  OBJECTIVE                    Vital Signs:  Current                Vital Signs: 24 Hour Range   BP: 104/69 (08/03 0700)  Temp: 36.6 ?C (97.9 ?F) (08/03 0700)  Pulse: 68 (08/03 0700)  Respirations: 18 PER MINUTE (08/03 0700)  SpO2: 99 % (08/03 0700) BP: (104-106)/(65-69)   Temp:  [36.6 ?C (97.9 ?F)-37.3 ?C (99.1 ?F)]   Pulse:  [68-104]   Respirations:  [16 PER MINUTE-18 PER MINUTE]   SpO2:  [99 %]    Intensity Pain Scale (Self Report): (not recorded)      Sleep:  Hours of Sleep: 4.5   Quality of Sleep: Resting Quietly    Scheduled Medications:  divalproex (DEPAKOTE ER) ER tablet 2,500 mg, 2,500 mg, Oral, QHS  nicotine (NICODERM CQ STEP 2) 14 mg/day patch 1 patch, 1 patch, Transdermal, QDAY  paliperidone palmitate (INVEGA) (+) injection 156 mg, 156 mg, Intramuscular, Q28 Days (4 weeks)      PRN Medications:  acetaminophen Q6H PRN, calcium carbonate TID PRN, eucalyptus-menthol Q2H PRN 1 lozenge at 05/31/21 1024, nicotine polacrilex Q2H PRN 4 mg at 06/02/21 1314, OLANZapine Q6H PRN **OR** OLANZapine Q6H PRN, polyethylene glycol 3350 QDAY PRN, traZODone QHS PRN 50 mg at 05/31/21 2203    Mental Status Exam:  ? General/Constitutional: 25 y.o. adult, appears stated age, wearing personal attire and hospital attire. moderately groomed.  ? Behavior: Overall calm, cooperative, appropriate for conversation and lying in bed  ? Speech/Motor: regular in rate, rhythm, volume, frequency. normal tone and good articulation  ? Eye Contact: good  ? Mood: sick  ? Affect: mood congurent. euthymic  ? Thought Process: linear and goal directed  ? Associations: Intact  ? Thought Content: denies suicidal ideations.  denies homicidal ideations.   ? Perception: endorses chronic visual hallucinations, denies auditory hallucinations. Does not appear to be responding to internal stimuli  ? Insight/Judgment: fair/fair    ? Orientation: appears alert and oriented, though not formally evaluated  ? Recent and Remote Memory: Intact  ? Attention span and concentration: appropriate for conversation  ? Language: fluent, English  ? Fund of knowledge and vocabulary: average    Focused Physical Exam:  ? Neurological: no tic or tremor   ? Respiratory: unlabored respirations   ______________________________________________________________  Aquilla Hacker, MD

## 2021-06-02 NOTE — Progress Notes
0092  Patient was calm, cooperative and compliant. He denied SI/HI/VH/depression/anxiety. Patient endorsed VH. He has had appropriate interactions with staff and peers and is able to state his needs adequately.

## 2021-06-02 NOTE — Care Plan
Problem: Health Maintenance - Impaired  Goal: Establish therapeutic relationships  Outcome: Goal Ongoing  Goal: Knowledge of health maintenance  Outcome: Goal Ongoing     Problem: Coping - Ineffective, Family  Goal: Effective Coping  Outcome: Goal Ongoing  Goal: Communicate with support staff  Outcome: Goal Ongoing  Goal: Knowledge of positive coping methods  Outcome: Goal Ongoing     Problem: Social Interaction - Impaired  Goal: Increased Social Interaction  Outcome: Goal Ongoing  Goal: Knowledge of social interaction  Outcome: Goal Ongoing     Problem: Discharge Planning  Goal: Participation in plan of care  Outcome: Goal Ongoing

## 2021-06-03 NOTE — Care Plan
RN Shift Assessment    Pt seen in milieu in milieu socializing with peers and staff. A/Ox 4. Endorsed VH of "what I usually see". Denied anxiety/depression/SI/HI/AH/pain. Compliant with medication. Will continue to monitor pt per protocol.    Mental Status Exam  Legal Status: Voluntary Admission  General Appearance: Normal  Mood / Affect: Congruent  Speech: Normal  Content Of Thought: Visual Hallucinations  Motor Activity: Normal  Flow of Thought: Linear  Sensorium: Orientation to person, Orientation to place, Orientation to situation  Insight / Judgment: Fair judgment, Fair insight  Behavior: Calm, Cooperative  Medical / Physical Concerns: n  Patient Strengths: Interpersonal relationships and supports, i.e. family, friends, peers    Vitals*  Pulse: 82  BP Patient Position: Chair  BP: 124/58  Respirations: 16 PER MINUTE  O2 Device: None (Room air)  SpO2: 99 %  SpO2 Location: Finger, Second (Index)     Problem: Health Maintenance - Impaired  Goal: Establish therapeutic relationships  Outcome: Goal Ongoing  Goal: Knowledge of health maintenance  Outcome: Goal Ongoing     Problem: Coping - Ineffective, Family  Goal: Effective Coping  Outcome: Goal Ongoing  Goal: Communicate with support staff  Outcome: Goal Ongoing  Goal: Knowledge of positive coping methods  Outcome: Goal Ongoing     Problem: Social Interaction - Impaired  Goal: Increased Social Interaction  Outcome: Goal Ongoing  Goal: Knowledge of social interaction  Outcome: Goal Ongoing     Problem: Discharge Planning  Goal: Participation in plan of care  Outcome: Goal Ongoing     Problem: Moderate Fall Risk  Goal: Moderate Fall Risk  Outcome: Goal Ongoing

## 2021-06-03 NOTE — Progress Notes
Chart check completed.

## 2021-06-03 NOTE — Progress Notes
7473  Patient was calm, cooperative and compliant. He denied SI/HI/AH/depression/anxiety/pain/paranoia. Patient endorsed VH. He shared that he slept well last night. He has had appropriate interactions with staff and peers and is able to state his needs adequately.

## 2021-06-03 NOTE — Progress Notes
PSYCHIATRIC PROGRESS NOTE     LOS: 78 days  Voluntary/Involuntary Status: Voluntary  Guardian? Yes  Meziah, [place] (Mother)   [phone]   Riyan, Haile (Father)  [phone]     MEDICATIONS   Current Psychotropic Medications:   1. [name] IM q4weeks, last dose 05/18/21 for schizoaffective disorder, bipolar type  2. Depakote ER 2500 mg QHS for mood stability     ASSESSMENT & DIAGNOSES     Jamie [name] is a 25 y.o. Caucasian adult with a history of schizoaffective disorder and polysubstance abuse who presented to [facility] as a transfer from OSH with manic symptoms in context of methamphetamine use. This appears to be precipitated by methamphetamine use. Factors that seem to have predisposed him to mania include schizoaffective disorder, substance abuse and uncle with schizophrenia. This current problem is maintained by ongoing substance abuse, lack of consistent medication compliance, and poor insight. However, protective factors include good support from parents who are guardians, motivation to seek treatment, and fair insight into current situation. Proposed treatment will consist of substance detoxification and medication management.    DSM-5 DIAGNOSES:  1. Schizoaffective disorder, bipolar type  2. Stimulant use disorder, methamphetamine type, moderate  3. Methamphetamine withdrawal, resolved  4. Tobacco use disorder (chewing tobacco), severe  5. Stimulant use disorder, cocaine type, unspecified severity     PLAN     ? No indication for changes to psychotropic medications on 06/03/21.   ? Invega Sustenna 156 mg IM; last on 05/18/21.    ? Depakote 2500mg  QHS  ? VPA level (5/25) 102  ? PRNs available for agitation, anxiety, sleep  ? Level 1 CARES assessment completed 6/8. Level 1 recommended Level 2 assessment. Level 2 completed 7/11.   ? SW interventions, has placement after level 2 processed.     Seen and discussed with Dr. Sherrine Maples        Metabolic labs completed on:  Lipid Panel:   LDL   Date Value Ref Range Status   03/19/2021 106 (H) <100 mg/dL Final     HDL   Date Value Ref Range Status   03/19/2021 55 >40 MG/DL Final     VLDL   Date Value Ref Range Status   03/19/2021 15 MG/DL Final     Cholesterol   Date Value Ref Range Status   03/19/2021 174 <200 MG/DL Final     Triglycerides   Date Value Ref Range Status   03/19/2021 76 <150 MG/DL Final       Blood Glucose:  Hemoglobin A1C   Date Value Ref Range Status   03/19/2021 5.3 4.0 - 6.0 % Final     Comment:     The ADA recommends that most patients with type 1 and type 2 diabetes maintain   an A1c level <7%.       ______________________________________________________________     SUBJECTIVE     Groups: No     Patient interviewed at bedside for follow up this morning. Patient reports no new concerns today. His mood is pretty good and his sleep is good. His appetite is decent. He denies any physical complaints. Patient denies HI, SI, AVH.      REVIEW OF SYSTEMS      Review of Systems   Constitutional: Negative for chills, diaphoresis and malaise/fatigue.   Respiratory: Negative for cough and shortness of breath.    Cardiovascular: Negative for chest pain.   Gastrointestinal: Negative for abdominal pain.   Neurological: Negative for headaches.   Psychiatric/Behavioral:  Negative for hallucinations and suicidal ideas.        OBJECTIVE                    Vital Signs:  Current                Vital Signs: 24 Hour Range   BP: 109/70 (08/04 0700)  Temp: 36.4 ?C (97.5 ?F) (08/04 0700)  Pulse: 82 (08/03 1915)  Respirations: 16 PER MINUTE (08/04 0700)  SpO2: 99 % (08/04 0700)  O2 Device: None (Room air) (08/03 1915) BP: (109-124)/(58-70)   Temp:  [36.4 ?C (97.5 ?F)-36.7 ?C (98.1 ?F)]   Pulse:  [82]   Respirations:  [16 PER MINUTE]   SpO2:  [99 %]   O2 Device: None (Room air)   Intensity Pain Scale (Self Report): (not recorded)      Sleep:  Hours of Sleep: 4.5   Quality of Sleep: Resting Quietly    Scheduled Medications:  divalproex (DEPAKOTE ER) ER tablet 2,500 mg, 2,500 mg, Oral, QHS  nicotine (NICODERM CQ STEP 2) 14 mg/day patch 1 patch, 1 patch, Transdermal, QDAY  paliperidone palmitate (INVEGA) (+) injection 156 mg, 156 mg, Intramuscular, Q28 Days (4 weeks)      PRN Medications:  acetaminophen Q6H PRN, calcium carbonate TID PRN, eucalyptus-menthol Q2H PRN 1 lozenge at 05/31/21 1024, nicotine polacrilex Q2H PRN 4 mg at 06/02/21 2147, OLANZapine Q6H PRN **OR** OLANZapine Q6H PRN, polyethylene glycol 3350 QDAY PRN, traZODone QHS PRN 50 mg at 05/31/21 2203    Mental Status Exam:  ? General/Constitutional: 25 y.o. adult, appears stated age, wearing personal attire and hospital attire. moderately groomed.  ? Behavior: Overall calm, cooperative, appropriate for conversation and lying in bed  ? Speech/Motor: regular in rate, rhythm, volume, frequency. normal tone and good articulation  ? Eye Contact: good  ? Mood: sick  ? Affect: mood congurent. euthymic  ? Thought Process: linear and goal directed  ? Associations: Intact  ? Thought Content: denies suicidal ideations.  denies homicidal ideations.   ? Perception: endorses chronic visual hallucinations, denies auditory hallucinations. Does not appear to be responding to internal stimuli  ? Insight/Judgment: fair/fair    ? Orientation: appears alert and oriented, though not formally evaluated  ? Recent and Remote Memory: Intact  ? Attention span and concentration: appropriate for conversation  ? Language: fluent, English  ? Fund of knowledge and vocabulary: average    Focused Physical Exam:  ? Neurological: no tic or tremor   ? Respiratory: unlabored respirations   ______________________________________________________________  Aquilla Hacker, MD

## 2021-06-03 NOTE — Care Plan
Problem: Health Maintenance - Impaired  Goal: Establish therapeutic relationships  Outcome: Goal Ongoing  Goal: Knowledge of health maintenance  Outcome: Goal Ongoing     Problem: Coping - Ineffective, Family  Goal: Effective Coping  Outcome: Goal Ongoing  Goal: Communicate with support staff  Outcome: Goal Ongoing  Goal: Knowledge of positive coping methods  Outcome: Goal Ongoing     Problem: Social Interaction - Impaired  Goal: Increased Social Interaction  Outcome: Goal Ongoing  Goal: Knowledge of social interaction  Outcome: Goal Ongoing     Problem: Discharge Planning  Goal: Participation in plan of care  Outcome: Goal Ongoing     Problem: Moderate Fall Risk  Goal: Moderate Fall Risk  Outcome: Goal Ongoing

## 2021-06-03 NOTE — Behavioral Health Treatment Team
TREATMENT TEAM NOTE  Name: Lleyton Byers        MRN: [id number]          DOB: [birth year]            Age: 25 y.o.  Admission Date: 03/16/2021       LOS: 78 days    Date of Service: 06/03/2021        Attendees:  UR: Ival Bible, LMSW  Nursing: Doristine Mango, RN  Therapy: Clement Husbands, [place], [facility]  Therapy: Marijean Heath, [facility], LCP  Lead Clinical Case Mangager: Eden Lathe, [facility]  Case Manager: Maryjane Hurter, LMSW  Rec Therapy: Clare Charon, CTRS  Pharmacy: Darrin Luis, PharmD  Psychology: Assunta Gambles. Lowry Bowl, PhD  Psychology: Peter Minium, PhD  Psychiatry: Shella Maxim. Sherrine Maples, DO      Significant Points Discussed: No updates, not attending groups.    Treatment Plan Discussion: Waiting for placement

## 2021-06-04 NOTE — Progress Notes
PSYCHIATRIC PROGRESS NOTE     LOS: 79 days  Voluntary/Involuntary Status: Voluntary  Guardian? Yes  Jamie [name], Jamie [name] (Mother)   [phone]   Jamie [name], Jamie [name] (Father)  [phone]     MEDICATIONS   Current Psychotropic Medications:   1. [name] IM q4weeks, last dose 05/18/21 for schizoaffective disorder, bipolar type  2. Depakote ER 2500 mg QHS for mood stability     ASSESSMENT & DIAGNOSES     Jamie [name] is a 25 y.o. Caucasian adult with a history of schizoaffective disorder and polysubstance abuse who presented to [facility] as a transfer from OSH with manic symptoms in context of methamphetamine use. This appears to be precipitated by methamphetamine use. Factors that seem to have predisposed him to mania include schizoaffective disorder, substance abuse and uncle with schizophrenia. This current problem is maintained by ongoing substance abuse, lack of consistent medication compliance, and poor insight. However, protective factors include good support from parents who are guardians, motivation to seek treatment, and fair insight into current situation. Proposed treatment will consist of substance detoxification and medication management.    DSM-5 DIAGNOSES:  1. Schizoaffective disorder, bipolar type  2. Stimulant use disorder, methamphetamine type, moderate  3. Methamphetamine withdrawal, resolved  4. Tobacco use disorder (chewing tobacco), severe  5. Stimulant use disorder, cocaine type, unspecified severity     PLAN     ? No indication for changes to psychotropic medications on 06/04/21.   ? Invega Sustenna 156 mg IM; last on 05/18/21.    ? Depakote 2500mg  QHS  ? VPA level (5/25) 102  ? PRNs available for agitation, anxiety, sleep  ? Level 1 CARES assessment completed 6/8. Level 1 recommended Level 2 assessment. Level 2 completed 7/11.   ? SW interventions, has placement after level 2 processed.     Seen and discussed with Dr. Sherrine Maples       Metabolic labs completed on:  Lipid Panel:   LDL   Date Value Ref Range Status   03/19/2021 106 (H) <100 mg/dL Final     HDL   Date Value Ref Range Status   03/19/2021 55 >40 MG/DL Final     VLDL   Date Value Ref Range Status   03/19/2021 15 MG/DL Final     Cholesterol   Date Value Ref Range Status   03/19/2021 174 <200 MG/DL Final     Triglycerides   Date Value Ref Range Status   03/19/2021 76 <150 MG/DL Final       Blood Glucose:  Hemoglobin A1C   Date Value Ref Range Status   03/19/2021 5.3 4.0 - 6.0 % Final     Comment:     The ADA recommends that most patients with type 1 and type 2 diabetes maintain   an A1c level <7%.       ______________________________________________________________     SUBJECTIVE     Groups: No     Patient interviewed at bedside for follow up this morning. Patient denies any new concerns. Patient reports his sleep and appetite have been good. He states his mood is alright.  He denies any physical complaints. Patient denies HI, SI, AVH.      REVIEW OF SYSTEMS      Review of Systems   Constitutional: Negative for malaise/fatigue.   Respiratory: Negative for shortness of breath.    Cardiovascular: Negative for chest pain.   Gastrointestinal: Negative for abdominal pain.   Neurological: Negative for headaches.   Psychiatric/Behavioral: Negative for hallucinations and suicidal  ideas.        OBJECTIVE                    Vital Signs:  Current                Vital Signs: 24 Hour Range   BP: 110/54 (08/05 0725)  Temp: 36.5 ?C (97.7 ?F) (08/05 0725)  Pulse: 70 (08/05 0725)  Respirations: 17 PER MINUTE (08/05 0725)  SpO2: 99 % (08/05 0725) BP: (110-123)/(54-65)   Temp:  [36.5 ?C (97.7 ?F)]   Pulse:  [70-78]   Respirations:  [17 PER MINUTE-18 PER MINUTE]   SpO2:  [97 %-99 %]    Intensity Pain Scale (Self Report): (not recorded)      Sleep:  Hours of Sleep: 6.5   Quality of Sleep: Resting Quietly    Scheduled Medications:  divalproex (DEPAKOTE ER) ER tablet 2,500 mg, 2,500 mg, Oral, QHS  nicotine (NICODERM CQ STEP 2) 14 mg/day patch 1 patch, 1 patch, Transdermal, QDAY  paliperidone palmitate (INVEGA) (+) injection 156 mg, 156 mg, Intramuscular, Q28 Days (4 weeks)      PRN Medications:  acetaminophen Q6H PRN, calcium carbonate TID PRN, eucalyptus-menthol Q2H PRN 1 lozenge at 05/31/21 1024, nicotine polacrilex Q2H PRN 4 mg at 06/03/21 2018, OLANZapine Q6H PRN **OR** OLANZapine Q6H PRN, polyethylene glycol 3350 QDAY PRN, traZODone QHS PRN 50 mg at 05/31/21 2203    Mental Status Exam:  ? General/Constitutional: 25 y.o. adult, appears stated age, wearing personal attire and hospital attire. moderately groomed.  ? Behavior: Overall calm, cooperative, appropriate for conversation and lying in bed  ? Speech/Motor: regular in rate, rhythm, volume, frequency. normal tone and good articulation  ? Eye Contact: good  ? Mood: alright  ? Affect: mood congurent. euthymic  ? Thought Process: linear and goal directed  ? Associations: Intact  ? Thought Content: denies suicidal ideations.  denies homicidal ideations.   ? Perception: endorses chronic visual hallucinations, denies auditory hallucinations. Does not appear to be responding to internal stimuli  ? Insight/Judgment: fair/fair    ? Orientation: appears alert and oriented, though not formally evaluated  ? Recent and Remote Memory: Intact  ? Attention span and concentration: appropriate for conversation  ? Language: fluent, English  ? Fund of knowledge and vocabulary: average    Focused Physical Exam:  ? Neurological: no tic or tremor   ? Respiratory: unlabored respirations   ______________________________________________________________  Aquilla Hacker, MD

## 2021-06-04 NOTE — Progress Notes
Chart check completed.

## 2021-06-04 NOTE — Care Plan
RN Shift Assessment    Pt seen in milieu socializing with peers and staff throughout evening. A/Ox 4. Endorsed no change in [facility]. Denied SI/HI/AH/pain. Compliant with medications. Will continue to monitor pt per protocol.    Mental Status Exam  Legal Status: Voluntary Admission  General Appearance: Normal  Mood / Affect: Congruent  Speech: Normal  Content Of Thought: Visual Hallucinations  Motor Activity: Normal  Flow of Thought: Linear  Sensorium: Normal  Insight / Judgment: Fair judgment, Fair insight  Behavior: Calm, Combative  Medical / Physical Concerns: n  Patient Strengths: Interpersonal relationships and supports, i.e. family, friends, peers, Awareness of substance use issues    Vitals*  Pulse: 78  BP Patient Position: Chair  BP: 123/65  Respirations: 18 PER MINUTE  O2 Device: None (Room air)  SpO2: 97 %  SpO2 Location: Finger, Second (Index)     Problem: Health Maintenance - Impaired  Goal: Establish therapeutic relationships  Outcome: Goal Ongoing  Goal: Knowledge of health maintenance  Outcome: Goal Ongoing     Problem: Coping - Ineffective, Family  Goal: Effective Coping  Outcome: Goal Ongoing  Goal: Communicate with support staff  Outcome: Goal Ongoing  Goal: Knowledge of positive coping methods  Outcome: Goal Ongoing     Problem: Social Interaction - Impaired  Goal: Increased Social Interaction  Outcome: Goal Ongoing  Goal: Knowledge of social interaction  Outcome: Goal Ongoing     Problem: Discharge Planning  Goal: Participation in plan of care  Outcome: Goal Ongoing     Problem: Moderate Fall Risk  Goal: Moderate Fall Risk  Outcome: Goal Ongoing

## 2021-06-05 NOTE — Progress Notes
Chart check completed.

## 2021-06-05 NOTE — Progress Notes
PSYCHIATRIC PROGRESS NOTE     LOS: 80 days  Voluntary/Involuntary Status: Voluntary  Guardian? Carroll, Bumpus (Mother) [phone]; Kaiwen, Nulph (Father) [phone]     MEDICATIONS   Current Psychotropic Medications:   divalproex (DEPAKOTE ER) ER tablet 2,500 mg, 2,500 mg, Oral, QHS  nicotine (NICODERM CQ STEP 2) 14 mg/day patch 1 patch, 1 patch, Transdermal, QDAY  paliperidone palmitate (INVEGA) (+) injection 156 mg, 156 mg, Intramuscular, Q28 Days (4 weeks)    Psychotropic PRNs (Past 24H):   None     ASSESSMENT & DIAGNOSES   Jamie [name] Mabile?is a 25 y.o.?Caucasian?adult?with a history of schizoaffective disorder and polysubstance abuse?who presented to [facility] as a transfer from OSH?with manic symptoms in context of methamphetamine use. This appears to be precipitated by?methamphetamine use. Factors that seem to have predisposed?him?to mania?include schizoaffective disorder, substance abuse and uncle with schizophrenia. This current problem is maintained by?ongoing substance abuse, lack of consistent medication compliance, and poor insight. However, protective factors include?good support from parents who are guardians, motivation to seek treatment, and fair insight into current situation. Proposed treatment will consist of?substance detoxification and medication management.  ?  DSM-5 DIAGNOSES:  1. Schizoaffective disorder, bipolar type  2. Stimulant use disorder, methamphetamine type, moderate  3. Methamphetamine withdrawal, resolved  4. Tobacco use disorder (chewing tobacco), severe  5. Stimulant use disorder, cocaine type, unspecified severity   PLAN     ? Continue the following psychotropic medications:  o Invega Sustenna 156mg  IM; last given on 05/18/21  o Depakote 2500mg  QHS  - VPA lvl (03/24/21): 102   ? PRNs available for pain, anxiety, sleep, and severe agitation.   ? Encourage participation in ward milieu and therapies.  ? Level 1 CARES assessment completed 6/8. Level 1 recommended Level 2 assessment.?Level 2 completed 7/11.   ? SW interventions, has placement after level 2 processed.     Disposition: Maintain admission for safety and stabilization.  Placement pending    Seen and discussed with Dr. Sharlynn Oliphant, M.D.   Psychiatry, PGY-2  Pager: [phone]  ______________________________________________________________     SUBJECTIVE   Per nursing, no acute events overnight. Ohm slept 6.5hrs last night and ate 100% of meals.    Jandell was seen for follow-up this morning. He reports that his mood is great today. He feels that he's on a stable medication regimen and remains motivated for placement. Otherwise, he denies SI, HI, AVH. No other concerns at this time.      REVIEW OF SYSTEMS   Review of Systems   Constitutional: Negative.    Musculoskeletal: Negative.    Psychiatric/Behavioral: Negative.       OBJECTIVE                    Vital Signs:  Current                Vital Signs: 24 Hour Range   BP: 112/56 (08/06 0725)  Temp: 36.6 ?C (97.9 ?F) (08/06 0725)  Pulse: 57 (08/06 0725)  Respirations: 18 PER MINUTE (08/06 0725)  SpO2: 98 % (08/06 0725) BP: (112-123)/(56-58)   Temp:  [36.6 ?C (97.9 ?F)-37.1 ?C (98.8 ?F)]   Pulse:  [57-80]   Respirations:  [18 PER MINUTE]   SpO2:  [98 %]    Intensity Pain Scale (Self Report): (not recorded)      Mental Status Exam:   General: Alert male wearing hospital attire, good hygiene  Behavior: calm, cooperative. no evidence of PMA/PMR. no strange  mannerisms. good eye contact   Speech/Language: RRR with fair articulation. normal speech volume. fluent in [place]  Mood: great  Affect: normal range, euthymic , mood congruent  Thought Process: Linear and goal directed  Thought Content: Denies suicidal ideation. Denies homicidal ideation. No evidence of delusions.   Perception: Denies auditory hallucinations, Denies visual hallucinations. Denies dissociative symptoms  Cognition: Memory, attention, and fund of knowledge not formally assessed but patient is tracking with conversation and appears oriented.   Insight: Fair  Judgment: Fair     Physical Exam:  Neuro: No gross neurologic deficits.   Musculoskeletal: Moves all four extremities spontaneously    All scheduled Medications:  divalproex (DEPAKOTE ER) ER tablet 2,500 mg, 2,500 mg, Oral, QHS  nicotine (NICODERM CQ STEP 2) 14 mg/day patch 1 patch, 1 patch, Transdermal, QDAY  paliperidone palmitate (INVEGA) (+) injection 156 mg, 156 mg, Intramuscular, Q28 Days (4 weeks)      All available PRN Medications:  acetaminophen Q6H PRN, calcium carbonate TID PRN, eucalyptus-menthol Q2H PRN 1 lozenge at 05/31/21 1024, nicotine polacrilex Q2H PRN 4 mg at 06/05/21 0832, OLANZapine Q6H PRN **OR** OLANZapine Q6H PRN, polyethylene glycol 3350 QDAY PRN, traZODone QHS PRN 50 mg at 05/31/21 2203    Metabolic labs:  Lipid Panel:   LDL   Date Value Ref Range Status   03/19/2021 106 (H) <100 mg/dL Final     HDL   Date Value Ref Range Status   03/19/2021 55 >40 MG/DL Final     VLDL   Date Value Ref Range Status   03/19/2021 15 MG/DL Final     Cholesterol   Date Value Ref Range Status   03/19/2021 174 <200 MG/DL Final     Triglycerides   Date Value Ref Range Status   03/19/2021 76 <150 MG/DL Final       Blood Glucose:  Hemoglobin A1C   Date Value Ref Range Status   03/19/2021 5.3 4.0 - 6.0 % Final     Comment:     The ADA recommends that most patients with type 1 and type 2 diabetes maintain   an A1c level <7%.       _________________________________________________________  Lance Sell, MD

## 2021-06-05 NOTE — Progress Notes
Pt has been calm, cooperative and compliant with all aspects of care. Pt expresses that his mood is okay. He denies any SI, HI, AH and VH. Will continue to monitor.

## 2021-06-05 NOTE — Care Plan
Patient reports he will try to stay in  milieu after meals. He is cooperative and compliant with care and medications. He interacts well with staff and peers. He attends some groups. Mood is stable and behavior appropriate for the unit. He denied all psych.        Problem: Health Maintenance - Impaired  Goal: Establish therapeutic relationships  Outcome: Goal Ongoing     Problem: Coping - Ineffective, Family  Goal: Effective Coping  Outcome: Goal Ongoing     Problem: Social Interaction - Impaired  Goal: Increased Social Interaction  Outcome: Goal Ongoing     Problem: Discharge Planning  Goal: Participation in plan of care  Outcome: Goal Ongoing     Problem: Moderate Fall Risk  Goal: Moderate Fall Risk  Outcome: Goal Ongoing

## 2021-06-06 NOTE — Care Plan
Patient slept in after breakfast and stayed up after lunch interacting and playing cards with peers. He is calm, cooperative and compliant. Mood and behavior stable.      Problem: Health Maintenance - Impaired  Goal: Establish therapeutic relationships  Outcome: Goal Ongoing     Problem: Coping - Ineffective, Family  Goal: Effective Coping  Outcome: Goal Ongoing     Problem: Social Interaction - Impaired  Goal: Increased Social Interaction  Outcome: Goal Ongoing     Problem: Discharge Planning  Goal: Participation in plan of care  Outcome: Goal Ongoing     Problem: Moderate Fall Risk  Goal: Moderate Fall Risk  Outcome: Goal Ongoing

## 2021-06-06 NOTE — Progress Notes
PSYCHIATRIC PROGRESS NOTE     LOS: 81 days  Voluntary/Involuntary Status: Voluntary  Guardian? Stepen, Prins (Mother) [phone]; Martine, Trageser (Father) [phone]     MEDICATIONS   Current Psychotropic Medications:   divalproex (DEPAKOTE ER) ER tablet 2,500 mg, 2,500 mg, Oral, QHS  nicotine (NICODERM CQ STEP 2) 14 mg/day patch 1 patch, 1 patch, Transdermal, QDAY  paliperidone palmitate (INVEGA) (+) injection 156 mg, 156 mg, Intramuscular, Q28 Days (4 weeks)    Psychotropic PRNs (Past 24H):   None     ASSESSMENT & DIAGNOSES   Jamie [name]?is a 25 y.o.?Caucasian?adult?with a history of schizoaffective disorder and polysubstance abuse?who presented to [facility] as a transfer from OSH?with manic symptoms in context of methamphetamine use. This appears to be precipitated by?methamphetamine use. Factors that seem to have predisposed?him?to mania?include schizoaffective disorder, substance abuse and uncle with schizophrenia. This current problem is maintained by?ongoing substance abuse, lack of consistent medication compliance, and poor insight. However, protective factors include?good support from parents who are guardians, motivation to seek treatment, and fair insight into current situation. Proposed treatment will consist of?substance detoxification and medication management.  ?  DSM-5 DIAGNOSES:  1. Schizoaffective disorder, bipolar type  2. Stimulant use disorder, methamphetamine type, moderate  3. Methamphetamine withdrawal, resolved  4. Tobacco use disorder (chewing tobacco), severe  5. Stimulant use disorder, cocaine type, unspecified severity   PLAN     ? Continue the following psychotropic medications:  o Invega Sustenna 156mg  IM; last given on 05/18/21  o Depakote 2500mg  QHS  - VPA lvl (03/24/21): 102   ? PRNs available for pain, anxiety, sleep, and severe agitation.   ? Encourage participation in ward milieu and therapies.  ? Level 1 CARES assessment completed 6/8. Level 1 recommended Level 2 assessment.?Level 2 completed 7/11.   ? SW interventions, has placement after level 2 processed.     Disposition: Maintain admission for safety and stabilization.  Placement pending    Seen and discussed with Dr. Sherrine Maples     SUBJECTIVE   Per nursing, no acute events overnight. Patient interviewed for follow up at bedside. Patient reports his sleep was pretty good and his appetite and mood are good. He has no new concerns and denies SI, HI, AVH.      REVIEW OF SYSTEMS   Review of Systems   Constitutional: Negative.    Musculoskeletal: Negative.    Psychiatric/Behavioral: Negative.       OBJECTIVE                    Vital Signs:  Current                Vital Signs: 24 Hour Range   BP: 118/64 (08/07 0700)  Temp: 36.6 ?C (97.9 ?F) (08/07 0700)  Pulse: 59 (08/07 0700)  Respirations: 18 PER MINUTE (08/07 0700)  SpO2: 96 % (08/07 0700) BP: (118-121)/(64-65)   Temp:  [36.6 ?C (97.9 ?F)]   Pulse:  [59-69]   Respirations:  [18 PER MINUTE]   SpO2:  [96 %-97 %]    Intensity Pain Scale (Self Report): (not recorded)      Mental Status Exam:   General: Alert male wearing hospital attire, good hygiene  Behavior: calm, cooperative. no evidence of PMA/PMR. no strange mannerisms. good eye contact   Speech/Language: RRR with fair articulation. normal speech volume. fluent in [place]  Mood: mood  Affect: normal range, euthymic , mood congruent  Thought Process: Linear and goal directed  Thought Content: Denies suicidal  ideation. Denies homicidal ideation. No evidence of delusions.   Perception: Denies auditory hallucinations, Denies visual hallucinations. Denies dissociative symptoms  Cognition: Memory, attention, and fund of knowledge not formally assessed but patient is tracking with conversation and appears oriented.   Insight: Fair  Judgment: Fair     Physical Exam:  Neuro: No gross neurologic deficits.   Musculoskeletal: Moves all four extremities spontaneously    All scheduled Medications:  divalproex (DEPAKOTE ER) ER tablet 2,500 mg, 2,500 mg, Oral, QHS  nicotine (NICODERM CQ STEP 2) 14 mg/day patch 1 patch, 1 patch, Transdermal, QDAY  paliperidone palmitate (INVEGA) (+) injection 156 mg, 156 mg, Intramuscular, Q28 Days (4 weeks)      All available PRN Medications:  acetaminophen Q6H PRN, calcium carbonate TID PRN, eucalyptus-menthol Q2H PRN 1 lozenge at 05/31/21 1024, nicotine polacrilex Q2H PRN 4 mg at 06/06/21 1324, OLANZapine Q6H PRN **OR** OLANZapine Q6H PRN, polyethylene glycol 3350 QDAY PRN, traZODone QHS PRN 50 mg at 05/31/21 2203    Metabolic labs:  Lipid Panel:   LDL   Date Value Ref Range Status   03/19/2021 106 (H) <100 mg/dL Final     HDL   Date Value Ref Range Status   03/19/2021 55 >40 MG/DL Final     VLDL   Date Value Ref Range Status   03/19/2021 15 MG/DL Final     Cholesterol   Date Value Ref Range Status   03/19/2021 174 <200 MG/DL Final     Triglycerides   Date Value Ref Range Status   03/19/2021 76 <150 MG/DL Final       Blood Glucose:  Hemoglobin A1C   Date Value Ref Range Status   03/19/2021 5.3 4.0 - 6.0 % Final     Comment:     The ADA recommends that most patients with type 1 and type 2 diabetes maintain   an A1c level <7%.       _________________________________________________________  Aquilla Hacker, MD

## 2021-06-06 NOTE — Progress Notes
Pt denies anxiety, depression, SI/HI/AH and physical complaints. He is visible in milieu during evening hours. Interactive appropriately with staff and peers. He voices no new concerns.  Pt reports that his appetite and sleep have been appropriate. Will cont to monitor

## 2021-06-07 NOTE — Care Coordination-Inpatient
Care Coordination - Inpatient Note      Patient Name:   Jamie [name]                        MRN:  [id number]  Admission Date:  03/16/2021    CM reviewed chart. Patient had Level II screen on 05/10/21. [facility], [name] reported in a e-mail stating, "it is not uncommon for assessments to take up to 30 days for [facility] to process. On 06/10/21 it will be 30 days from initial Level II screen. CM will follow up with [facility] this Thursday.        Jamie [name]  06/07/2021

## 2021-06-07 NOTE — Progress Notes
Chart check completed.

## 2021-06-07 NOTE — Care Plan
Jamie [name] is compliant with medications. Pt denies SIHISH.  Pt has VH of floaters.  Pt denies AH.  Pt states it was hard to get to sleep and appetite is fine.  Pt states mood is not bad and feels like Groundhog Day and is reliving the same day over and over again.  Pt states he is learning as much as he can, staff is really good and is wanting to leave.  Pt denies anxiety and depression.  Pt denies pain.  Remijio encouraged to attend groups.  Dee denies any questions or concerns at this time.    Problem: Health Maintenance - Impaired  Goal: Establish therapeutic relationships  Outcome: Goal Ongoing  Goal: Knowledge of health maintenance  Outcome: Goal Ongoing     Problem: Coping - Ineffective, Family  Goal: Effective Coping  Outcome: Goal Ongoing  Goal: Communicate with support staff  Outcome: Goal Ongoing  Goal: Knowledge of positive coping methods  Outcome: Goal Ongoing     Problem: Social Interaction - Impaired  Goal: Increased Social Interaction  Outcome: Goal Ongoing  Goal: Knowledge of social interaction  Outcome: Goal Ongoing     Problem: Discharge Planning  Goal: Participation in plan of care  Outcome: Goal Ongoing     Problem: Moderate Fall Risk  Goal: Moderate Fall Risk  Outcome: Goal Ongoing

## 2021-06-07 NOTE — Progress Notes
PSYCHIATRIC PROGRESS NOTE     LOS: 82 days  Voluntary/Involuntary Status: Voluntary  Guardian? Joaovictor, Krone (Mother) [phone]; Hassaan, Crite (Father) [phone]     MEDICATIONS   Current Psychotropic Medications:   divalproex (DEPAKOTE ER) ER tablet 2,500 mg, 2,500 mg, Oral, QHS  nicotine (NICODERM CQ STEP 2) 14 mg/day patch 1 patch, 1 patch, Transdermal, QDAY  paliperidone palmitate (INVEGA) (+) injection 156 mg, 156 mg, Intramuscular, Q28 Days (4 weeks)    Psychotropic PRNs (Past 24H):   None     ASSESSMENT & DIAGNOSES   Jamie [name]?is a 25 y.o.?Caucasian?adult?with a history of schizoaffective disorder and polysubstance abuse?who presented to [facility] as a transfer from OSH?with manic symptoms in context of methamphetamine use. This appears to be precipitated by?methamphetamine use. Factors that seem to have predisposed?him?to mania?include schizoaffective disorder, substance abuse and uncle with schizophrenia. This current problem is maintained by?ongoing substance abuse, lack of consistent medication compliance, and poor insight. However, protective factors include?good support from parents who are guardians, motivation to seek treatment, and fair insight into current situation. Proposed treatment will consist of?substance detoxification and medication management.  ?  DSM-5 DIAGNOSES:  1. Schizoaffective disorder, bipolar type  2. Stimulant use disorder, methamphetamine type, moderate  3. Methamphetamine withdrawal, resolved  4. Tobacco use disorder (chewing tobacco), severe  5. Stimulant use disorder, cocaine type, unspecified severity   PLAN     ? Continue the following psychotropic medications:  o [place] 156mg  IM; last given on 05/18/21  o Depakote 2500mg  QHS  - VPA lvl (03/24/21): 102   ? PRNs available for pain, anxiety, sleep, and severe agitation.   ? Encourage participation in ward milieu and therapies.  ? Level 1 CARES assessment completed 6/8. Level 1 recommended Level 2 assessment.?Level 2 completed 7/11.   ? SW interventions, has placement after level 2 processed.     Disposition: Maintain admission for safety and stabilization.  Placement pending    Seen and discussed with Dr. Sherrine Maples     SUBJECTIVE   Per nursing, no acute events overnight. Patient interviewed at bedside for follow up. Patient reports he slept ok and his appetite is fine. He describes his mood as not bad. He denies any physical complaints or new concerns. He denies SI, HI, AVH.        REVIEW OF SYSTEMS   Review of Systems   Constitutional: Negative.    Eyes: Negative for visual disturbance.   Respiratory: Negative for shortness of breath.    Cardiovascular: Negative for chest pain.   Gastrointestinal: Negative for abdominal pain.   Musculoskeletal: Negative.    Neurological: Negative for light-headedness.   Psychiatric/Behavioral: Negative.       OBJECTIVE                    Vital Signs:  Current                Vital Signs: 24 Hour Range   BP: 111/57 (08/08 0714)  Temp: 36.5 ?C (97.7 ?F) (08/08 5784)  Pulse: 50 (08/08 0714)  Respirations: 18 PER MINUTE (08/08 0714)  SpO2: 96 % (08/08 0714) BP: (111-141)/(57-71)   Temp:  [36.5 ?C (97.7 ?F)-37.3 ?C (99.1 ?F)]   Pulse:  [50-74]   Respirations:  [18 PER MINUTE]   SpO2:  [96 %-97 %]    Intensity Pain Scale (Self Report): (not recorded)      Mental Status Exam:   General: Alert male wearing hospital attire, good hygiene  Behavior: calm,  cooperative. no evidence of PMA/PMR. no strange mannerisms. good eye contact   Speech/Language: RRR with fair articulation. normal speech volume. fluent in [place]  Mood: not bad  Affect: normal range, euthymic , mood congruent  Thought Process: Linear and goal directed  Thought Content: Denies suicidal ideation. Denies homicidal ideation. No evidence of delusions.   Perception: Denies auditory hallucinations, Denies visual hallucinations. Denies dissociative symptoms  Cognition: Memory, attention, and fund of knowledge not formally assessed but patient is tracking with conversation and appears oriented.   Insight: Fair  Judgment: Fair     Physical Exam:  Neuro: No gross neurologic deficits.   Musculoskeletal: Moves all four extremities spontaneously    All scheduled Medications:  divalproex (DEPAKOTE ER) ER tablet 2,500 mg, 2,500 mg, Oral, QHS  nicotine (NICODERM CQ STEP 2) 14 mg/day patch 1 patch, 1 patch, Transdermal, QDAY  paliperidone palmitate (INVEGA) (+) injection 156 mg, 156 mg, Intramuscular, Q28 Days (4 weeks)      All available PRN Medications:  acetaminophen Q6H PRN, calcium carbonate TID PRN, eucalyptus-menthol Q2H PRN 1 lozenge at 05/31/21 1024, nicotine polacrilex Q2H PRN 4 mg at 06/06/21 2116, OLANZapine Q6H PRN **OR** OLANZapine Q6H PRN, polyethylene glycol 3350 QDAY PRN, traZODone QHS PRN 50 mg at 05/31/21 2203    Metabolic labs:  Lipid Panel:   LDL   Date Value Ref Range Status   03/19/2021 106 (H) <100 mg/dL Final     HDL   Date Value Ref Range Status   03/19/2021 55 >40 MG/DL Final     VLDL   Date Value Ref Range Status   03/19/2021 15 MG/DL Final     Cholesterol   Date Value Ref Range Status   03/19/2021 174 <200 MG/DL Final     Triglycerides   Date Value Ref Range Status   03/19/2021 76 <150 MG/DL Final       Blood Glucose:  Hemoglobin A1C   Date Value Ref Range Status   03/19/2021 5.3 4.0 - 6.0 % Final     Comment:     The ADA recommends that most patients with type 1 and type 2 diabetes maintain   an A1c level <7%.       _________________________________________________________  Aquilla Hacker, MD

## 2021-06-07 NOTE — Care Plan
Pt meeting with treatment team daily.  Patient denies pain, SI/HI/AVH.   Mood and behavior have been stable.  Pt out in the milieu, watched tv, and interacting with peers.  Pt compliant with HS medication.        Problem: Health Maintenance - Impaired  Goal: Establish therapeutic relationships  Outcome: Goal Ongoing  Goal: Knowledge of health maintenance  Outcome: Goal Ongoing     Problem: Coping - Ineffective, Family  Goal: Effective Coping  Outcome: Goal Ongoing  Goal: Communicate with support staff  Outcome: Goal Ongoing  Goal: Knowledge of positive coping methods  Outcome: Goal Ongoing     Problem: Social Interaction - Impaired  Goal: Increased Social Interaction  Outcome: Goal Ongoing  Goal: Knowledge of social interaction  Outcome: Goal Ongoing     Problem: Discharge Planning  Goal: Participation in plan of care  Outcome: Goal Ongoing     Problem: Moderate Fall Risk  Goal: Moderate Fall Risk  Outcome: Goal Ongoing

## 2021-06-08 NOTE — Progress Notes
Pt was present in the milieu throughout the evening interacting with peers, playing cards and watching TV. Pt denied pain, depression, anxiety and SI/HI/AVH. Pt was compliant with prescribed HS medications and received nicotine gum 1x. Pt will continue to be monitored per protocol. Pt encouraged to come to staff with questions or concerns.

## 2021-06-08 NOTE — Behavioral Health Treatment Team
TREATMENT TEAM NOTE  Name: Jamie [name]        MRN: [id number]          DOB: 1996/03/12            Age: 25 y.o.  Admission Date: 03/16/2021       LOS: 83 days    Date of Service: 06/08/2021        Attendees:  Nursing: Raynaldo Opitz, BSN, RN  Therapy: Clement Husbands, LSCSW, [facility]  Therapy: Marijean Heath, LMSW  Lead Clinical Case Mangager: Eden Lathe, [facility]  Case Manager: Maryjane Hurter, LMSW  Activities Coordinator: Philippa Chester  Pharmacy: Dario Guardian  PharmD  Pharmacy Resident: Duwayne Heck Dauchot   Psychology: Alleen Borne PhD  Psychiatry: Shella Maxim. Sherrine Maples, DO  Psychiatry: Frankey Poot, APRN-NP      Significant Points Discussed: No update    Treatment Plan Discussion: No update

## 2021-06-08 NOTE — Progress Notes
Psychiatry Progress Note  Name: Jamie [name]          MRN: [id number] DOB: 03/24/1996         Age: 25 y.o.  Admission Date: 03/16/2021  LOS: 83 days    Service: [facility] Adult Psych 1    Principal Problem:    Schizoaffective disorder, bipolar type (HCC)  Active Problems:    Tobacco use disorder, severe, dependence    Withdrawal from methamphetamine (HCC)    Methamphetamine use disorder, moderate (HCC)    PLAN  1. Continue current medication      REASONS FOR CONTINUED HOSPITALIZATION  Psychiatric stabilization and medication management. Patient with decreased social support and at risk for recidivism if discharged at this time.       ______________________________________________________________  SUBJECTIVE  Patient seen for follow up. No significant changes in sleep, appetite, energy, or mood. Denies SI/HI/AH/VH.    REVIEW OF SYSTEMS  Review of Systems   Constitutional: Negative for appetite change.   Respiratory: Negative for shortness of breath.    Cardiovascular: Negative for chest pain.   Gastrointestinal: Negative for nausea and vomiting.   Psychiatric/Behavioral: Negative for hallucinations and suicidal ideas.     ______________________________________________________________  OBJECTIVE                 Vital Signs:  Current                Vital Signs: 24 Hour Range   BP: 115/64 (08/09 0730)  Temp: 36.3 ?C (97.3 ?F) (08/09 0730)  Pulse: 51 (08/09 0730)  Respirations: 16 PER MINUTE (08/09 0730)  SpO2: 97 % (08/09 0730)  O2 Device: None (Room air) (08/08 1915) BP: (115-138)/(64-66)   Temp:  [36.3 ?C (97.3 ?F)-36.8 ?C (98.2 ?F)]   Pulse:  [51-87]   Respirations:  [16 PER MINUTE-18 PER MINUTE]   SpO2:  [97 %-100 %]   O2 Device: None (Room air)   Intensity Pain Scale (Self Report): (not recorded)      Scheduled Medications:  divalproex (DEPAKOTE ER) ER tablet 2,500 mg, 2,500 mg, Oral, QHS  nicotine (NICODERM CQ STEP 2) 14 mg/day patch 1 patch, 1 patch, Transdermal, QDAY  paliperidone palmitate (INVEGA) (+) injection 156 mg, 156 mg, Intramuscular, Q28 Days (4 weeks)        PRN Medications:  acetaminophen Q6H PRN, calcium carbonate TID PRN, eucalyptus-menthol Q2H PRN 1 lozenge at 05/31/21 1024, nicotine polacrilex Q2H PRN 4 mg at 06/08/21 0825, OLANZapine Q6H PRN **OR** OLANZapine Q6H PRN, polyethylene glycol 3350 QDAY PRN, traZODone QHS PRN 50 mg at 05/31/21 2203    Mental Status Exam:    General/Constitutional: adult male   Speech: normal rate, rhythm, volume, and tone  Thought Process: linear and goal directed  Thought Content: Denies SI/HI  Perception: denies AH/VH  Associations: intact  Mood: good  Affect: euthymic  Insight/Judgement: partial/fair    Orientation: grossly intact  Recent and remote memory: intact  Attention span and concentration: appropriate  Language: fluent, English speaker  Progress Energy of knowledge and vocabulary: appropriate    Focused Physical Exam:  Musculoskeletal: normal gait      ______________________________________________________________  Alyce Pagan, DO

## 2021-06-08 NOTE — Care Plan
Problem: Health Maintenance - Impaired  Goal: Knowledge of health maintenance  Outcome: Goal Ongoing     Problem: Coping - Ineffective, Family  Goal: Communicate with support staff  Outcome: Goal Ongoing     Problem: Discharge Planning  Goal: Participation in plan of care  Outcome: Goal Ongoing

## 2021-06-08 NOTE — Care Plan
Problem: Health Maintenance - Impaired  Goal: Establish therapeutic relationships  Outcome: Goal Ongoing  Goal: Knowledge of health maintenance  Outcome: Goal Ongoing     Problem: Coping - Ineffective, Family  Goal: Effective Coping  Outcome: Goal Ongoing  Goal: Communicate with support staff  Outcome: Goal Ongoing  Goal: Knowledge of positive coping methods  Outcome: Goal Ongoing     Problem: Social Interaction - Impaired  Goal: Increased Social Interaction  Outcome: Goal Ongoing  Goal: Knowledge of social interaction  Outcome: Goal Ongoing     Problem: Discharge Planning  Goal: Participation in plan of care  Outcome: Goal Ongoing     Problem: Moderate Fall Risk  Goal: Moderate Fall Risk  Outcome: Goal Ongoing

## 2021-06-08 NOTE — Progress Notes
PSYCHIATRIC NURSING ASSESSMENT     NAME:Jamie [name]             MRN: [id number]             DOB:Apr 24, 1996          AGE: 25 y.o.  ADMISSION DATE: 03/16/2021             DAYS ADMITTED: LOS: 83 days    Mental Status Exam  Legal Status: Voluntary Admission  General Appearance: Normal  Mood / Affect: Congruent  Speech: Normal  Content Of Thought: Normal  Motor Activity: Normal  Flow of Thought: Linear, Goal directed  Sensorium: Normal  Insight / Judgment: Good judgment, Good insight  Behavior: Calm, Cooperative, Follows commands, Normal  Medical / Physical Concerns: n  Patient Strengths: Setting and pursuing goals, Managing surrounding demands and opportunities, Knowledge of medications, Interpersonal relationships and supports, i.e. family, friends, peers, Exercising self-direction    [name] denies depression, anxiety, SI/HI, AVH, and pain. He says his mood is "pretty good" and "tired." Pt is polite and jokes around with staff and peers during the day. He spends a lot of time sleeping, and he says that he has already attended all of the groups offered at [facility]. Appropriate appetite and sleep per pt report. Pt is waiting for placement. PRN nicotine gum administered per orders for cravings.     Encouraged him to come to staff with concerns. Pt is on 15 minute checks to monitor for safety and behaviors.     Renee Ramus, RN  06/08/2021

## 2021-06-08 NOTE — Progress Notes
(  Psychology [phone]     Service(s): Individual Counseling and Psychotherapy    Presentation: Met with Jamie [name] to provide individual counseling. He reported his mood as "good." Patient described his plan after inpatient care which included gaining independency, medication compliance, obtaining employment, volunteering, and physical exercise. Patient identified his mental health risks which included medication non-compliance, substance use, interpersonal relationships, and impulsive behaviors. Patient and therapist identified interpersonal assertiveness skills as an area of significance as it pertains to his substance use history and risks. Patient stated that certain friend groups and situations/settings encourage substance use and at times he is faced with difficulty refusing drugs and/or being in an environment where drugs are easily accessible.Overall, patient was insightful, motivated, and engaged throughout the session. He was receptive and open to practicing the interpersonal assertiveness skills with fellow peers on the unit to encourage skill acquisition.       MSE:?Casually dressed and groomed, A+Ox4, mood "good, positive affect, eye contact WNL, speech WNL, TP linear and goal-directed, TC denies SI, HI, AH, VH, insight was fair-good, judgment was good, actively engaged in session     Psychotherapy: Individual counseling was provided. Session focused on rapport building through reflective listening and insight-oriented questioning. Interpersonal assertiveness skills were introduced during the session with psychoeducation and exercised through a brief role play with patient. Response to session was positive.      Plan:?Will continue to follow.     Barbee Cough, M.A./[employer]

## 2021-06-08 NOTE — Progress Notes
Chart check completed.

## 2021-06-09 NOTE — Behavioral Health Treatment Team
TREATMENT TEAM NOTE  Name: Jamie [name]        MRN: [id number]          DOB: October 14, 1996            Age: 25 y.o.  Admission Date: 03/16/2021       LOS: 84 days    Date of Service: 06/09/2021        Attendees:  UR: Ival Bible, LMSW  Nursing: Doristine Mango, RN  Therapy: Clement Husbands, [place], [facility]  Therapy: Marijean Heath, [facility], LCP  Lead Clinical Case Mangager: Eden Lathe, [facility]  Case Manager: Maryjane Hurter, LMSW  Rec Therapy: Clare Charon, CTRS  Pharmacy: Dario Guardian, PharmD  Psychology: Peter Minium, PhD  Psychiatry: Shella Maxim. Sherrine Maples, DO      Significant Points Discussed: Attended a group in the afternoon. Cooperative and calm.    Treatment Plan Discussion: Waiting for placement

## 2021-06-09 NOTE — Progress Notes
Chart check completed

## 2021-06-09 NOTE — Progress Notes
Pt was present in the milieu throughout the evening. Pt denied pain, depression, anxiety and SI/HI/AVH. Pt was compliant with prescribed HS medications and received nicotine gum 1x. Pt reported having a good day since it was taco Tuesday. Pt shared he had been drinking sugar free drinks to lose weight. Pt will continue to be monitored per protocol. Pt encouraged to come to staff with questions or concerns.

## 2021-06-09 NOTE — Progress Notes
Chart check completed.

## 2021-06-09 NOTE — Progress Notes
[facility]  Therapy Progress Note    NAME:Jamie [name] MRN: [id number] DOB:09-17-1996 AGE: 25 y.o.  ADMISSION DATE: 03/16/2021 DAYS ADMITTED: LOS: 84 days    Date of Service:  06/09/21    Principal Problem:    Schizoaffective disorder, bipolar type ([place])  Active Problems:    Tobacco use disorder, severe, dependence    Withdrawal from methamphetamine [facility])    Methamphetamine use disorder, moderate (HCC)    Objective:  Therapist had a brief interaction with Patient as he was walking up/down the hallway.  Eye contact WNL.  Affect full; mood congruent.    Narrative:  Patient talked about how he views events beyond his control and related to discharge.  He was able to identify some positive traits such as patience and recognizing that all of his critical needs are still being met while he awaits placement.  Patient also expressed appreciation for his ability to maintain sobriety.       Follow up:  Therapist reinforced Patient's positive attitude regarding his situation.  He displayed an adaptive sense of humor during the session.  He was also observed interacting appropriately with his Peers.  Treatment Team will continue to follow.

## 2021-06-09 NOTE — Care Plan
Problem: Health Maintenance - Impaired  Goal: Knowledge of health maintenance  Outcome: Goal Ongoing     Problem: Coping - Ineffective, Family  Goal: Communicate with support staff  Outcome: Goal Ongoing     Problem: Discharge Planning  Goal: Participation in plan of care  Outcome: Goal Ongoing

## 2021-06-09 NOTE — Care Plan
Denies SI/HI/AVH. Attending groups. Pleasant. Goal directed. No changes.   Problem: Health Maintenance - Impaired  Goal: Establish therapeutic relationships  Outcome: Goal Ongoing  Goal: Knowledge of health maintenance  Outcome: Goal Ongoing     Problem: Coping - Ineffective, Family  Goal: Effective Coping  Outcome: Goal Ongoing  Goal: Communicate with support staff  Outcome: Goal Ongoing  Goal: Knowledge of positive coping methods  Outcome: Goal Ongoing     Problem: Social Interaction - Impaired  Goal: Increased Social Interaction  Outcome: Goal Ongoing  Goal: Knowledge of social interaction  Outcome: Goal Ongoing     Problem: Discharge Planning  Goal: Participation in plan of care  Outcome: Goal Ongoing     Problem: Moderate Fall Risk  Goal: Moderate Fall Risk  Outcome: Goal Ongoing

## 2021-06-09 NOTE — Group Note
Name: Delvecchio Madole        MRN: [id number]          DOB: 1996-04-17            Age: 25 y.o.  Admission Date: 03/16/2021       LOS: 84 days    Date of Service: 06/09/2021      Group Topic: BH Leisure Activities  Group Date: 06/09/2021  Start Time: 1300  End Time: 1400  Facilitators: Clare Charon          Number of Participants: 8  Group Focus: leisure skills and social skills  Treatment Modality: Leisure Development  Interventions utilized were group exercise, leisure development, mental fitness, and other Ice Breaker: Two Truths and a Lie and Immunologist.  Purpose: enhance coping skills, express feelings, improve communication skills, regain self-worth, and reinforce self-care          Name: Konrad Date of Birth: 1995/11/17   MR: [id number]      Level of Participation: patient declined to participate in group

## 2021-06-09 NOTE — Progress Notes
Psychiatry Progress Note  Name: Jamie [name]          MRN: [id number] DOB: December 18, 1995         Age: 25 y.o.  Admission Date: 03/16/2021  LOS: 84 days    Service: [facility] Adult Psych 1    Principal Problem:    Schizoaffective disorder, bipolar type (HCC)  Active Problems:    Tobacco use disorder, severe, dependence    Withdrawal from methamphetamine (HCC)    Methamphetamine use disorder, moderate (HCC)    PLAN  1. Continue current medication      REASONS FOR CONTINUED HOSPITALIZATION  Psychiatric stabilization and medication management. Patient with decreased social support and at risk for recidivism if discharged at this time.       ______________________________________________________________  SUBJECTIVE  No acute events overnight, per nursing. Patient seen for follow up at bedside. Patient reports his sleep was not too great and his appetite is really good. His mood is good and he denies any anxiety or depression. He denies AH but endorses seeing floaters and lines, which are likely visual disturbances. He denies any physical complaints. Denies SI/HI.     REVIEW OF SYSTEMS  Review of Systems   Respiratory: Negative for shortness of breath.    Cardiovascular: Negative for chest pain.   Gastrointestinal: Negative for abdominal pain.   Psychiatric/Behavioral: Negative for hallucinations and suicidal ideas.     ______________________________________________________________  OBJECTIVE                 Vital Signs:  Current                Vital Signs: 24 Hour Range   BP: 109/55 (08/10 0700)  Temp: 36.3 ?C (97.4 ?F) (08/10 0700)  Pulse: 53 (08/10 0700)  Respirations: 18 PER MINUTE (08/10 0700)  SpO2: 97 % (08/10 0700) BP: (109-120)/(55-64)   Temp:  [36.3 ?C (97.4 ?F)-36.9 ?C (98.4 ?F)]   Pulse:  [53-82]   Respirations:  [16 PER MINUTE-18 PER MINUTE]   SpO2:  [97 %-99 %]    Intensity Pain Scale (Self Report): (not recorded)      Scheduled Medications:  divalproex (DEPAKOTE ER) ER tablet 2,500 mg, 2,500 mg, Oral, QHS  nicotine (NICODERM CQ STEP 2) 14 mg/day patch 1 patch, 1 patch, Transdermal, QDAY  paliperidone palmitate (INVEGA) (+) injection 156 mg, 156 mg, Intramuscular, Q28 Days (4 weeks)        PRN Medications:  acetaminophen Q6H PRN, calcium carbonate TID PRN, eucalyptus-menthol Q2H PRN 1 lozenge at 05/31/21 1024, nicotine polacrilex Q2H PRN 4 mg at 06/08/21 2039, OLANZapine Q6H PRN **OR** OLANZapine Q6H PRN, polyethylene glycol 3350 QDAY PRN, traZODone QHS PRN 50 mg at 05/31/21 2203    Mental Status Exam:    General/Constitutional: adult male   Speech: normal rate, rhythm, volume, and tone  Thought Process: linear and goal directed  Thought Content: Denies SI/HI  Perception: denies AH/VH  Associations: intact  Mood: good  Affect: euthymic  Insight/Judgement: partial/fair    Orientation: grossly intact  Recent and remote memory: intact  Attention span and concentration: appropriate  Language: fluent, English speaker  Progress Energy of knowledge and vocabulary: appropriate    Focused Physical Exam:  Musculoskeletal: normal gait      ______________________________________________________________  Aquilla Hacker, MD

## 2021-06-09 NOTE — Group Note
Name: Jamie [name]        MRN: [id number]          DOB: 02-01-1996            Age: 26 y.o.  Admission Date: 03/16/2021       LOS: 84 days    Date of Service: 06/09/2021      Group Topic: BH Leisure Activities  Group Date: 06/09/2021  Start Time: 1030  End Time: 1130  Facilitators: Judi Cong          Number of Participants: 11  Group Focus: coping skills  Treatment Modality: Art Therapy  Interventions utilized were assignment and exploration  Purpose: reinforce self-care          Name: Jamie [name] Date of Birth: 07/15/1996   MR: [id number]      Level of Participation: patient not present for group

## 2021-06-10 NOTE — Progress Notes
Psychiatry Progress Note  Name: Jamie [name]          MRN: [id number] DOB: 06/01/1996         Age: 25 y.o.  Admission Date: 03/16/2021  LOS: 85 days    Service: [facility]    Principal Problem:    Schizoaffective disorder, bipolar type (HCC)  Active Problems:    Tobacco use disorder, severe, dependence    Withdrawal from methamphetamine (HCC)    Methamphetamine use disorder, moderate (HCC)    PLAN  1. Continue current medication      REASONS FOR CONTINUED HOSPITALIZATION  Psychiatric stabilization and medication management. Patient with decreased social support and at risk for recidivism if discharged at this time.       ______________________________________________________________  [name] has no new concerns today. He states that his sleep was good and his appetite is not bad. His mood is not too bad. Patient denies any feelings of anxiety or depression. Patient denies SI, HI, AVH.     REVIEW OF SYSTEMS  Review of Systems   Respiratory: Negative for shortness of breath.    Cardiovascular: Negative for chest pain.   Gastrointestinal: Negative for abdominal pain.   Musculoskeletal: Negative for myalgias.   Psychiatric/Behavioral: Negative for suicidal ideas.     ______________________________________________________________  OBJECTIVE                 Vital Signs:  Current                Vital Signs: 24 Hour Range   BP: 107/61 (08/11 0700)  Temp: 36.4 ?C (97.5 ?F) (08/11 0700)  Pulse: 52 (08/11 0700)  Respirations: 14 PER MINUTE (08/11 0700)  SpO2: 97 % (08/11 0700) BP: (107-128)/(61-65)   Temp:  [36.4 ?C (97.5 ?F)-36.6 ?C (97.9 ?F)]   Pulse:  [52-79]   Respirations:  [14 PER MINUTE-16 PER MINUTE]   SpO2:  [97 %]    Intensity Pain Scale (Self Report): (not recorded)      Scheduled Medications:  divalproex (DEPAKOTE ER) ER tablet 2,500 mg, 2,500 mg, Oral, QHS  nicotine (NICODERM CQ STEP 2) 14 mg/day patch 1 patch, 1 patch, Transdermal, QDAY  paliperidone palmitate (INVEGA) (+) injection 156 mg, 156 mg, Intramuscular, Q28 Days (4 weeks)        PRN Medications:  acetaminophen Q6H PRN, calcium carbonate TID PRN, eucalyptus-menthol Q2H PRN 1 lozenge at 05/31/21 1024, nicotine polacrilex Q2H PRN 4 mg at 06/09/21 2018, OLANZapine Q6H PRN **OR** OLANZapine Q6H PRN, polyethylene glycol 3350 QDAY PRN, traZODone QHS PRN 50 mg at 05/31/21 2203    Mental Status Exam:    General/Constitutional: adult male   Speech: normal rate, rhythm, volume, and tone  Thought Process: linear and goal directed  Thought Content: Denies SI/HI  Perception: denies AH/VH  Associations: intact  Mood: not too bad  Affect: euthymic  Insight/Judgement: partial/fair    Orientation: grossly intact  Recent and remote memory: intact  Attention span and concentration: appropriate  Language: fluent, English speaker  [employer] of knowledge and vocabulary: appropriate    Focused Physical Exam:  Musculoskeletal: normal gait      ______________________________________________________________  Aquilla Hacker, MD

## 2021-06-10 NOTE — Progress Notes
Chart check completed.

## 2021-06-10 NOTE — Care Coordination-Inpatient
Care Coordination - Inpatient Note      Patient Name:   Jamie [name]                        MRN:  [id number]  Admission Date:  03/16/2021    CM emailed [facility] for an update on the status of the Level II assessment.     Dayanara Sherrill  06/10/2021

## 2021-06-10 NOTE — Progress Notes
[facility]  Therapy Progress Note    NAME:[name] MRN: [id number] DOB:Jul 15, 1996 AGE: 25 y.o.  ADMISSION DATE: 03/16/2021 DAYS ADMITTED: LOS: 85 days    Date of Service:  06/10/21    Principal Problem:    Schizoaffective disorder, bipolar type (HCC)  Active Problems:    Tobacco use disorder, severe, dependence    Withdrawal from methamphetamine [facility])    Methamphetamine use disorder, moderate (HCC)    Objective:  Therapist joined Patient as he was walking up/down the hallway.  Eye contact WNL.  Affect full: mood congruent.    Narrative:  Patient is aware that he will be discharging tomorrow.  Coping skills and lessons learned while on this Unit were reviewed.  Patient said he feels more stable than at any other time when facing discharge.  The importance of maintaining sobriety and not making impulsive decisions were discussed.  Red flags/early warning signs related to mania and delusional thinking were reviewed.  Patient expressed confidence that he would be able to make new friendships.  Critical thinking and pragmatic problem solving skills were also reviewed.    Follow up:  Treatment Team will continue to follow.

## 2021-06-10 NOTE — Progress Notes
Chart check completed

## 2021-06-10 NOTE — Behavioral Health Treatment Team
TREATMENT TEAM NOTE  Name: Jamie [name]          MRN: [id number]              DOB: [birth year]          Age: 25 y.o.  Admission Date: 03/16/2021                 Attendees:   UR: Ival Bible, LMSW  Nursing: Doristine Mango, RN  Therapy: Marijean Heath, [facility], LCP  Lead Clinical Case Mangager: Eden Lathe, [facility]  Case Manager: Maryjane Hurter, LMSW  Case Manager: Rickey Barbara, LCSW  Art Therapy: Judi Cong, Lorrin Jackson, [facility]  Pharmacy: Justin Mend, PharmD  Psychology: Peter Minium, PhD  Psychiatry: Shella Maxim. Sherrine Maples, DO  Psychiatry: Frankey Poot, APRN-NP    Significant Points Discussed: Will follow up on [facility] referral    Potential Discharge Date:

## 2021-06-10 NOTE — Care Plan
Problem: Health Maintenance - Impaired  Goal: Establish therapeutic relationships  Outcome: Goal Ongoing  Goal: Knowledge of health maintenance  Outcome: Goal Ongoing     Problem: Coping - Ineffective, Family  Goal: Effective Coping  Outcome: Goal Ongoing  Goal: Communicate with support staff  Outcome: Goal Ongoing  Goal: Knowledge of positive coping methods  Outcome: Goal Ongoing     Problem: Social Interaction - Impaired  Goal: Increased Social Interaction  Outcome: Goal Ongoing  Goal: Knowledge of social interaction  Outcome: Goal Ongoing     Problem: Discharge Planning  Goal: Participation in plan of care  Outcome: Goal Ongoing     Problem: Moderate Fall Risk  Goal: Moderate Fall Risk  Outcome: Goal Ongoing

## 2021-06-10 NOTE — Care Plan
Pt social and appropriate in the milieu. He continues to be without complaint or concern and verbalized and understanding that he is continuing to await placement. Will continue to monitor.    Problem: Health Maintenance - Impaired  Goal: Establish therapeutic relationships  Outcome: Goal Ongoing     Problem: Coping - Ineffective, Family  Goal: Effective Coping  Outcome: Goal Ongoing  Goal: Communicate with support staff  Outcome: Goal Ongoing

## 2021-06-11 DIAGNOSIS — Z9114 Patient's other noncompliance with medication regimen: Secondary | ICD-10-CM

## 2021-06-11 DIAGNOSIS — I10 Essential (primary) hypertension: Secondary | ICD-10-CM

## 2021-06-11 DIAGNOSIS — R45851 Suicidal ideations: Secondary | ICD-10-CM

## 2021-06-11 DIAGNOSIS — Z818 Family history of other mental and behavioral disorders: Secondary | ICD-10-CM

## 2021-06-11 DIAGNOSIS — H539 Unspecified visual disturbance: Secondary | ICD-10-CM

## 2021-06-11 DIAGNOSIS — F1523 Other stimulant dependence with withdrawal: Secondary | ICD-10-CM

## 2021-06-11 MED ORDER — TRAZODONE 50 MG PO TAB
50 mg | ORAL_TABLET | Freq: Every evening | ORAL | 0 refills | Status: AC | PRN
Start: 2021-06-11 — End: ?

## 2021-06-11 MED ORDER — DIVALPROEX 500 MG PO TB24
2500 mg | ORAL_TABLET | Freq: Every evening | ORAL | 0 refills | 30.00000 days | Status: AC
Start: 2021-06-11 — End: ?

## 2021-06-11 MED ORDER — NICOTINE (POLACRILEX) 4 MG BU GUM
4 mg | BUCCAL | 0 refills | Status: AC | PRN
Start: 2021-06-11 — End: ?

## 2021-06-11 MED ORDER — INVEGA SUSTENNA 156 MG/ML IM SYRG
156 mg | INTRAMUSCULAR | 0 refills | 28.00000 days | Status: AC
Start: 2021-06-11 — End: ?

## 2021-06-11 MED ORDER — NICOTINE 14 MG/24 HR TD PT24
1 | MEDICATED_PATCH | Freq: Every day | TRANSDERMAL | 0 refills | Status: AC
Start: 2021-06-11 — End: ?

## 2021-06-11 NOTE — Progress Notes
Chart check completed

## 2021-06-11 NOTE — Care Plan
Pt continues to be medication compliant and pleasant and cooperative. He denied any SI/HI and reported he is eager for expected discharge to [facility]. Will continue to monitor.     Problem: Health Maintenance - Impaired  Goal: Establish therapeutic relationships  Outcome: Goal Ongoing     Problem: Coping - Ineffective, Family  Goal: Effective Coping  Outcome: Goal Ongoing  Goal: Communicate with support staff  Outcome: Goal Ongoing     Problem: Social Interaction - Impaired  Goal: Increased Social Interaction  Outcome: Goal Ongoing

## 2021-06-11 NOTE — Group Note
Name: Marjorie Lussier        MRN: [id number]          DOB: 08/31/1996            Age: 25 y.o.  Admission Date: 03/16/2021       LOS: 86 days    Date of Service: 06/11/2021      Group Topic: BH Emotion Regulation (DBT Skill)  Group Date: 06/10/2021  Start Time: 1500  End Time: 1600  Facilitators: Sherilyn Banker      Number of Participants: 9  Group Focus: coping skills, feeling awareness/expression, and self-awareness  Treatment Modality: Recreation Therapy  Interventions utilized were group exercise, patient education, and support  Purpose: enhance coping skills, express feelings, and increase insight    Recreation therapist provided a worksheet with the numbers +5 to -5. Patients were then prompted to identify songs that they would listen to when they would feel that intensity of emotion. 0 being neutral. Patients were then prompted to share their neutral song and then their current intensity of feeling song with the group and discuss if they felt comfortable. Therapist then educated patients on how to utilize this scale after discharge.        Name: Dakhari Date of Birth: December 31, 1995   MR: [id number]      Level of Participation: active   Quality of Participation: attentive, cooperative and engaged  Interactions with others: gave feedback  Mood/Affect: appropriate and euthymic  Triggers (if applicable): not applicable  Cognition: coherent/clear  Progress: Moderate  Response: Patient participated fully in all parts of group.  Plan: to continue treatment

## 2021-06-11 NOTE — Discharge Instructions - Appointments
Case Management Discharge Instructions:  -Take medications as prescribed  -Follow up with medication management as recommended.  -Utilize individual and group therapy as needed.  -Continue using daily coping skills.   -In case of crisis contact 911 or nearest hospital emergency room.     Appointment Information:  Follow up with [facility] for on going psychiatry needs    [phone]  [address], [place] [address]    Other Follow Up Information:  National Suicide Prevention Lifeline: 1-800-273-8255 or you can text 988.   We can all help prevent suicide. The Lifeline provides 24/7, free and confidential support for people in distress, prevention and crisis resources for you or your loved ones, and best practices for professionals.      Crisis Text Line: Text HELLO or HOME to 988  Crisis Text Line serves anyone, in any type of crisis, providing access to free, 24/7 support and information via a medium people already use and trust: text.

## 2021-06-11 NOTE — Progress Notes
Chart check completed.

## 2021-06-11 NOTE — Other
Safety Plan   Name: Jamie [name]          MRN: [id number]              DOB: [birth year]        Age: 25 y.o.  Admission Date: 03/16/2021             Step 1: WHAT I LOOK FORWARD TO IN MY FUTURE are (future accomplishments):   1. Get my life back together; back on track  2. Getting situated in my new surroundings  3. Getting back into the workforce    Step 2: What are my protective factors? (loved ones, pets, things you look forward to, faith, job, supportive people, fear of death)   1. Medication  2. My values  3. Family support  4. Self-control    Step 3: What are TRIGGERS that I can avoid that may result in thoughts of harm to myself or others? (situations, people, behaviors, places, substance)  1. Anxiety  2. Alcohol  3. Other substance use.  The temptation to use is also a trigger    Step 4: What are my internal WARNING SIGNS of distress? (thoughts, images, moods, memories)  1. Restlessness, agitation  2. Excessive talking  3. Hyperactivity  4. Sleeplessness    Step 5: COPING STRATEGIES - What can I do on my own to help prevent myself from acting out on my thoughts to harm myself or others?     1. Music  2. Call a friend  3. I really like to cook    Step 6: How can I attempt to have safer surroundings in my environment to prevent a crisis?  1. I need to be cautious about the people I trust  2. I need to avoid being around negative people, bad influences  3. Need to maintain self-control  4. Medication compliance  5. Know my limits.  For example, not overdoing it with work.  Not being spread too thin       NATIONAL SUICIDE PREVENTION LIFELINE:  988   Call or Text, 24-hours a day/7-days a week   Or 1-800-SUICIDE (1.(854)602-0946)  For people who suffer from hearing impairment:  6600772340      This resource is available nationwide.  Call or text during a mental health, substance use, or suicide crisis.       If a crisis develops, and/or I want to harm myself or others, and using the above safety plan is NOT EFFECTIVE, I agree to immediately do the following:     1. Either remove myself from the presence of weapons, medications, or other means of harming myself, or ask a trusted friend/family member to remove such things from my home.   2. Call 911 or go the nearest hospital emergency room.      Additional Contacts for myself and my loved ones:   The Veterans Suicide Hotline (Veterans Crisis Line): 781-479-7558, press 1 or text to (850)643-4263 (available 24 hours a day, seven days a week):  o [employer] & online chat: https://sampson.info/  o Lesbian, Gay, Bisexual, Transgender and Questioning (LGBTQ) Suicide Hotline (the [employer]):   952-876-6247 available 24 hours a day, seven days a week   [employer]:  Text ?Start? to 906-539-8489  o [employer] National Helpline - 1-800-662-HELP 415 401 0016)  o The Disaster Distress Helpline (262)794-3547 or text TalkWithUs to (509)015-5773  o Sexual assault hotline: 1.443-246-4644 available 24/7  o Domestic Violence hotline: 1-800-799-SAFE 575-499-2948) available 24/7.  o The [employer] on Mental Illness (NAMI) website

## 2021-06-11 NOTE — Group Note
Name: Jamie [name]        MRN: [id number]          DOB: August 26, 1996            Age: 25 y.o.  Admission Date: 03/16/2021       LOS: 86 days    Date of Service: 06/11/2021      Group Topic: BH Relaxation and Self-Soothing  Group Date: 06/11/2021  Start Time: 1015  End Time: 1100  Facilitators: Judi Cong          Number of Participants: 8  Group Focus: relaxation  Treatment Modality: Art Therapy  Interventions utilized were assignment and exploration  Purpose: enhance coping skills, express feelings, and reinforce self-care          Name: Jamie [name] Date of Birth: December 15, 1995   MR: [id number]      Level of Participation: patient not present for group

## 2021-06-11 NOTE — Care Plan
Pt d/c to treatment facility. Pt transported by [facility]. Discharge instructions completed. Belongings returned      Problem: Health Maintenance - Impaired  Goal: Establish therapeutic relationships  06/11/2021 1328 by Carlene Coria, RN  Outcome: Goal Achieved  06/11/2021 1047 by Carlene Coria, RN  Outcome: Goal Ongoing  Goal: Knowledge of health maintenance  06/11/2021 1328 by Carlene Coria, RN  Outcome: Goal Achieved  06/11/2021 1047 by Carlene Coria, RN  Outcome: Goal Ongoing     Problem: Coping - Ineffective, Family  Goal: Effective Coping  06/11/2021 1328 by Carlene Coria, RN  Outcome: Goal Achieved  06/11/2021 1047 by Carlene Coria, RN  Outcome: Goal Ongoing  Goal: Communicate with support staff  06/11/2021 1328 by Carlene Coria, RN  Outcome: Goal Achieved  06/11/2021 1047 by Carlene Coria, RN  Outcome: Goal Ongoing  Goal: Knowledge of positive coping methods  06/11/2021 1328 by Carlene Coria, RN  Outcome: Goal Achieved  06/11/2021 1047 by Carlene Coria, RN  Outcome: Goal Ongoing     Problem: Social Interaction - Impaired  Goal: Increased Social Interaction  06/11/2021 1328 by Carlene Coria, RN  Outcome: Goal Achieved  06/11/2021 1047 by Carlene Coria, RN  Outcome: Goal Ongoing  Goal: Knowledge of social interaction  06/11/2021 1328 by Carlene Coria, RN  Outcome: Goal Achieved  06/11/2021 1047 by Carlene Coria, RN  Outcome: Goal Ongoing     Problem: Discharge Planning  Goal: Participation in plan of care  06/11/2021 1328 by Carlene Coria, RN  Outcome: Goal Achieved  06/11/2021 1047 by Carlene Coria, RN  Outcome: Goal Ongoing     Problem: Moderate Fall Risk  Goal: Moderate Fall Risk  06/11/2021 1328 by Carlene Coria, RN  Outcome: Goal Achieved  06/11/2021 1047 by Carlene Coria, RN  Outcome: Goal Ongoing

## 2021-06-11 NOTE — Discharge Instructions
Case Management Discharge Instructions:  -Take medications as prescribed  -Follow up with medication management as recommended.  -Utilize individual and group therapy as needed.  -Continue using daily coping skills.   -In case of crisis contact 911 or nearest hospital emergency room.     Appointment Information:  Follow up with [facility] for on going psychiatry needs    [phone]  [address] [place], [place] [address]    Other Follow Up Information:  National Suicide Prevention Lifeline: 878-293-0676 or you can text 988.   We can all help prevent suicide. The Lifeline provides 24/7, free and confidential support for people in distress, prevention and crisis resources for you or your loved ones, and best practices for professionals.      Crisis Text Line: Text HELLO or HOME to 71  Crisis Text Line serves anyone, in any type of crisis, providing access to free, 24/7 support and information via a medium people already use and trust: text.

## 2021-06-11 NOTE — Care Plan
Pt has been calm, cooperative and compliant. Mood and behavior have been stable. Denies all psych. Med compliant. No issues...      Problem: Health Maintenance - Impaired  Goal: Establish therapeutic relationships  Outcome: Goal Ongoing  Goal: Knowledge of health maintenance  Outcome: Goal Ongoing     Problem: Coping - Ineffective, Family  Goal: Effective Coping  Outcome: Goal Ongoing  Goal: Communicate with support staff  Outcome: Goal Ongoing  Goal: Knowledge of positive coping methods  Outcome: Goal Ongoing     Problem: Social Interaction - Impaired  Goal: Increased Social Interaction  Outcome: Goal Ongoing  Goal: Knowledge of social interaction  Outcome: Goal Ongoing     Problem: Discharge Planning  Goal: Participation in plan of care  Outcome: Goal Ongoing     Problem: Moderate Fall Risk  Goal: Moderate Fall Risk  Outcome: Goal Ongoing

## 2021-06-11 NOTE — Discharge Instructions - Pharmacy
Discharge Summary      Name: Jamie [name]  Medical Record Number: [id number]        Account Number:  [id number]  Date Of Birth:  1996/08/22                         Age:  25 y.o.   Admit date:  03/16/2021                     Discharge date:  06/11/2021    Discharge Attending:  Dr. Sherrine Maples  Discharge Summary Completed By: Aquilla Hacker, MD    Service: [facility] Adult Psych 1    Reason for hospitalization:  Manic, AVH    Primary Discharge Diagnosis:   Schizoaffective disorder, bipolar type [facility])      Hospital Diagnoses:  Hospital Problems        Active Problems    * (Principal) Schizoaffective disorder, bipolar type (HCC)    Tobacco use disorder, severe, dependence    Withdrawal from methamphetamine (HCC)    Methamphetamine use disorder, moderate (HCC)        Significant Past Medical History        Alcohol dependence (HCC)  Hypertension  Myocarditis (HCC)  Schizoaffective disorder, bipolar type (HCC)  Tobacco use disorder, moderate, dependence    Allergies   Clozapine and Haldol [haloperidol lactate]    Brief Hospital Course   The patient was admitted and the following issues were addressed during this hospitalization:     Jamie [name] is a 25 year old male with Schizoaffective Disorder, Tobacco Use Disorder, and Stimulant Use Disorder who presented with a chief complaint of mania and symptoms of disorganization in the setting of methamphetamine use. Initially patient was restarted on Depakote 1000mg  QHS and started on risperidone 2mg  QHS. The depakote was eventually titrated to up to 2500mg  and patient was switched to [employer] (156 dose).     During this admission, the patient had no behavioral disturbances and participated fully in treatment. Medications were adjusted and the patient tolerated the above regimen well. The patient reported distress over substance abuse and medication compliance but throughout the admission discussed these stressors and felt more prepared to cope with these upon discharge. Patient had a rather long stay here while placement was arranged.     The day of discharge, the patient reported that his mood was greatly improved, and displayed a forward thinking thought process with plans to get a job and make friends.   At discharge, the patient denied SI/HI/AVH.      Items Needing Follow Up   Pending items or areas that need to be addressed at follow up: substance use    Pending Labs and Follow Up Radiology   Pending labs and/or radiology review at this time of discharge are listed below: if this area is blank, there are no items for review.         Medications   Is the patient being discharged on two or more antipsychotic medications? No    Metabolic Monitoring:         Body mass index is 36.5 kg/m?Marland Kitchen  Wt Readings from Last 3 Encounters:   04/21/21 110.5 kg (243 lb 9.6 oz)   01/21/21 108.4 kg (239 lb)   12/30/20 90.7 kg (200 lb)     BP Readings from Last 3 Encounters:   06/11/21 98/38   01/21/21 122/72   12/30/20 134/73     Lab Results  Component Value Date    CHOL 174 03/19/2021    TRIG 76 03/19/2021    HDL 55 03/19/2021    LDL 106 (H) 03/19/2021    VLDL 15 03/19/2021    NONHDLCHOL 119 03/19/2021    CHOLHDLC 3.5 10/08/2015     Lab Results   Component Value Date    HGBA1C 5.3 03/19/2021       Patient instructions/medications:   Medication List      START taking these medications    ? divalproex 500 mg ER tablet; Commonly known as: DEPAKOTE ER; Dose: 2,500   mg; Take five tablets by mouth at bedtime daily. Take with food.;   Quantity: 150 tablet; Refills: 0; Replaces: divalproex 500 mg tablet,   delayed release  ? nicotine 14 mg/day patch; Commonly known as: NICODERM CQ; Dose: 1 patch;   Apply one patch to top of skin as directed daily. Rotate patch location.    Indications: stop smoking; For: stop smoking; Quantity: 28 patch; Refills:   0  ? nicotine polacrilex 4 mg gum; Commonly known as: NICORETTE; Dose: 4 mg;   Place one each inside cheek (side of mouth) every 2 hours as needed. Chew   to soften and park in mouth between lip and gum. May use 1 piece per hour,   not to exceed 24 per day for 12 weeks. May be used longer, if needed.    Indications: stop smoking; For: stop smoking; Quantity: 220 each; Refills:   0  ? traZODone 50 mg tablet; Commonly known as: DESYREL; Dose: 50 mg; Take   one tablet by mouth at bedtime as needed. Indications: insomnia; For:   insomnia; Quantity: 30 tablet; Refills: 0     CONTINUE taking these medications    ? INVEGA SUSTENNA 156 mg/1 mL injection; Generic drug: paliperidone   palmitate; Dose: 156 mg; Doctor's comments: LAST DOSE 05/18/21. NEXT DOSE   DUE 06/15/21; Inject 1 mL into the muscle every 28 days. Indications:   schizophrenia with mood changes; For: schizophrenia with mood changes;   Quantity: 1 mL; Refills: 0     STOP taking these medications    ? divalproex 500 mg tablet, delayed release; Commonly known as: DEPAKOTE;   Replaced by: divalproex 500 mg ER tablet  ? ibuprofen 800 mg tablet; Commonly known as: MOTRIN  ? paliperidone 6 mg ER tablet; Commonly known as: INVEGA       Return Appointments and Scheduled Appointments   No appointment scheduled    Consults, Procedures, Diagnostics, Micro, Pathology   Consults: Internal Medicine  Surgical Procedures & Dates: None  Significant Diagnostic Studies, Micro and Procedures: none  Significant Pathology: none  Nutrition: No Dietitian Consult    Discharge Disposition, Condition   Patient Disposition: Rehab Facility (Not [place]) [62]  Condition at Discharge: Stable    Code Status     Code Status History     Date Active Date Inactive Code Status Order ID          03/17/2021 [id number] 06/11/2021 1536 Full Code [id number]  Paris Lore, RN Inpatient    11/29/2020 1109 12/09/2020 1549 Full Code [id number]  Forde Radon, DO Inpatient    Only showing the last 2 code statuses.          Patient Instructions        Regular Diet    You have no dietary restriction. Please continue with a healthy balanced diet.     Activity as Tolerated    It is important  to keep increasing your activity level after you leave the hospital.  Moving around can help prevent blood clots, lung infection (pneumonia) and other problems.  Gradually increasing the number of times you are up moving around will help you return to your normal activity level more quickly.  Continue to increase the number of times you are up to the chair and walking daily to return to your normal activity level. Begin to work toward your normal activity level at discharge     Report These Signs and Symptoms    Please contact your doctor if you have any of the following symptoms: suicidal or homicidal thoughts     Opioid (Narcotic) Safety Information    OPIOID (NARCOTIC) PAIN MEDICATION SAFETY    We care about your comfort, and believe you need opioid medications at this time to treat your pain.  An opioid is a strong pain medication.  It is only available by prescription for moderate to severe pain.  Usually these medications are used for only a short time to treat pain, but sometimes will be prescribed for longer.  Talk with your doctor or nurse about how long they expect you to need this medication.    When used the right way, opioids are safe and effective medications to treat your pain, even when used for a long time.  Yet, when used in the wrong way, opioids can be dangerous for you or others.  Opioids do not work for everyone.  Most patients do not get full relief of their pain from opioid medication; full relief of your pain may not be possible.     For your safety, we ask you to follow these instructions:    *Only take your opioid medication as prescribed.  If your pain is not controlled with the prescribed dose, or the medication is not lasting long enough, call your doctor.  *Do not break or crush your opioid medication unless your doctor or pharmacist says you can.  With certain medications, this can be dangerous, and may cause death.  *Never share your medications with others, even if they appear to have a good reason.  Never take someone else's pain medication-this is dangerous, and illegal (a crime).  Overdoses and deaths have occurred.  *Keep your opioid medications safe, as you would with cash, in a lock box or similar container.  *Make sure your opioids are going to be secure, especially if you are around children or teens.  *Talk with your doctor or pharmacist before you take other medications.  *Avoid driving, operating machinery, or drinking alcohol while taking opioid pain medication.  This may be unsafe.    Pain medications can cause constipation. Constipation is bowel movements that are less often than normal. Stools often become very hard and difficult to pass. This may lead to stomach pain and bloating. It may also cause pain when trying to use the bathroom. Constipation may be treated with suppositories, laxatives or stool softeners. A diet high in fiber with plenty of fluids helps to maintain regular, soft bowel movements.     Naloxone Nasal Spray Instructions    Naloxone nasal spray  Brand Name: Narcan  What is this medicine?  NALOXONE (nal OX one) is a narcotic blocker. It is used to treat narcotic drug overdose.    What are the signs of narcotic drug overdose?  The person does not wake up after shaking their shoulders, shouting their name or firmly rubbing the middle of their chest; very slow breathing  or no breaths at all; center black part of the eye (pupil) is very small or pinpoint.    How should I use this medicine?  This medicine is for use in the nose. Lay the person on their back. Support their neck with your hand and allow the head to tilt back before giving the medicine. The nasal spray should be given into 1 nostril. After giving the medicine, move the person onto their side. Do not remove or test the nasal spray until ready to use. If the person is not awake after 2-3 minutes, give a second dose in the other nostril.   Get emergency medical help right away after giving the first dose of this medicine, even if the person wakes up. You should be familiar with how to recognize the signs and symptoms of a narcotic overdose.  Talk to your pediatrician regarding the use of this medicine in children. While this drug may be prescribed for children as young as newborns for selected conditions, precautions do apply.    What side effects may I notice from receiving this medicine?  Side effects that you should report to your doctor or health care professional as soon as possible:  ? allergic reactions like skin rash, itching or hives, swelling of the face, lips, or tongue  ? breathing problems  ? fast, irregular heartbeat  ? high blood pressure  ? pain that was controlled by narcotic pain medicine  ? seizures  Side effects that usually do not require medical attention (report to your doctor or health care professional if they continue or are bothersome):  ? anxious  ? chills  ? diarrhea  ? fever  ? headache  ? muscle pain  ? nausea, vomiting  ? nose irritation like dryness, congestion, and swelling  ? sweating  What may interact with this medicine?  This medicine is only used during an emergency. No interactions are expected during emergency use.  What if I miss a dose?  This does not apply.  Where should I keep my medicine?  Keep out of the reach of children.  Store between 4 and 40 degrees C (39 and 104 degrees F). Do not freeze. Throw away any unused medicine after the expiration date. Keep in original box until ready to use.  What should I tell my health care provider before I take this medicine?  They need to know if you have any of these conditions:  ? drug abuse or addiction  ? heart disease  ? an unusual or allergic reaction to naloxone, other medicines, foods, dyes, or preservatives  ? pregnant or trying to get pregnant  ? breast-feeding  What should I watch for while using this medicine?  Keep this medicine ready for use in the case of a narcotic overdose. Make sure that you have the phone number of your doctor or health care professional and local hospital ready. You may need to have additional doses of this medicine. Each nasal spray contains a single dose. Some emergencies may require additional doses.  After use, bring the treated person to the nearest hospital or call 911. Make sure the treating health care professional knows that the person has received an injection of this medicine. You will receive additional instructions on what to do during and after use of this medicine before an emergency occurs.  NOTE: This sheet is a summary. It may not cover all possible information. If you have questions about this medicine, talk to your doctor, pharmacist, or health  care provider.     Questions About Your Stay    For questions or concerns regarding your hospital stay, call [phone].     Discharging attending physician: Alyce Pagan [phone]        Additional Orders: Case Management, Supplies, Home Health     Home Health/DME     None            Signed:  Aquilla Hacker, MD  06/11/2021      cc:  Primary Care Physician:  Alroy Bailiff   Verified  Referring physicians:  No ref. provider found   Additional provider(s):        Did we miss something? If additional records are needed, please fax a request on office letterhead to [phone]. Please include the patient's name, date of birth, fax number and type of information needed. Additional request can be made by email at [email or web address]. For general questions of information about electronic records sharing, call [phone].

## 2021-06-11 NOTE — Progress Notes
Psychiatry Progress Note  Name: Jamie [name]          MRN: [id number] DOB: 10/05/1996         Age: 25 y.o.  Admission Date: 03/16/2021  LOS: 86 days    Service: [facility] Adult Psych 1    Principal Problem:    Schizoaffective disorder, bipolar type (HCC)  Active Problems:    Tobacco use disorder, severe, dependence    Withdrawal from methamphetamine (HCC)    Methamphetamine use disorder, moderate (HCC)    PLAN  1. Continue current medication      REASONS FOR CONTINUED HOSPITALIZATION  Psychiatric stabilization and medication management. Patient with decreased social support and at risk for recidivism if discharged at this time.     ______________________________________________________________  Jamie [name] has no new concerns today. No acute events overnight, per nursing. He states that his sleep was good and his appetite is decent. His mood is really good. Patient denies any feelings of anxiety or depression. Patient is very excited about discharge. Patient denies SI, HI, AVH.     REVIEW OF SYSTEMS  Review of Systems   Respiratory: Negative for shortness of breath.    Cardiovascular: Negative for chest pain.   Gastrointestinal: Negative for abdominal pain.   Musculoskeletal: Negative for myalgias.   Psychiatric/Behavioral: Negative for suicidal ideas.     ______________________________________________________________  OBJECTIVE                 Vital Signs:  Current                Vital Signs: 24 Hour Range   BP: 98/38 (08/12 0715)  Temp: 36.8 ?C (98.2 ?F) (08/12 0715)  Pulse: 55 (08/12 0715)  Respirations: 16 PER MINUTE (08/12 0715)  SpO2: 98 % (08/12 0715) BP: (98-129)/(38-68)   Temp:  [36.8 ?C (98.2 ?F)-36.9 ?C (98.5 ?F)]   Pulse:  [55-83]   Respirations:  [16 PER MINUTE]   SpO2:  [98 %]    Intensity Pain Scale (Self Report): (not recorded)      Scheduled Medications:  divalproex (DEPAKOTE ER) ER tablet 2,500 mg, 2,500 mg, Oral, QHS  nicotine (NICODERM CQ STEP 2) 14 mg/day patch 1 patch, 1 patch, Transdermal, QDAY  paliperidone palmitate (INVEGA) (+) injection 156 mg, 156 mg, Intramuscular, Q28 Days (4 weeks)    PRN Medications:  acetaminophen Q6H PRN, calcium carbonate TID PRN, eucalyptus-menthol Q2H PRN 1 lozenge at 05/31/21 1024, nicotine polacrilex Q2H PRN 4 mg at 06/09/21 2018, OLANZapine Q6H PRN **OR** OLANZapine Q6H PRN, polyethylene glycol 3350 QDAY PRN, traZODone QHS PRN 50 mg at 05/31/21 2203    Mental Status Exam:    General/Constitutional: adult male   Speech: normal rate, rhythm, volume, and tone  Thought Process: linear and goal directed  Thought Content: Denies SI/HI  Perception: denies AH/VH  Associations: intact  Mood: really good  Affect: euthymic  Insight/Judgement: partial/fair    Orientation: grossly intact  Recent and remote memory: intact  Attention span and concentration: appropriate  Language: fluent, English speaker  [employer] of knowledge and vocabulary: appropriate    Focused Physical Exam:  Musculoskeletal: normal gait   Extremities: Move all extremities spontaneously      ______________________________________________________________  Aquilla Hacker, MD

## 2021-06-11 NOTE — Group Note
Name: Jamie [name]        MRN: [id number]          DOB: [birth year]            Age: 25 y.o.  Admission Date: 03/16/2021       LOS: 86 days    Date of Service: 06/11/2021      Group Topic: BH Coping Skills  Group Date: 06/11/2021  Start Time: 0930  End Time: 1030  Facilitators: Sherilyn Banker      Number of Participants: 9  Group Focus: coping skills, feeling awareness/expression, and self-awareness  Treatment Modality: Recreation Therapy  Interventions utilized were group exercise, patient education, and support  Purpose: enhance coping skills, express feelings, and reinforce self-care    The recreation therapist provided the group with instructions and expectations to be followed throughout the group. The recreation therapist invited clients to play a game of Spoons, in which they tried to get four of a kind first and grab a spoon before they all ran out. The therapist led the group members in a discussion to process the game and talk about experiencing flow. Patients discussed how flow can benefit [place] and activities that allow them to experience it.            Name: Jamie [name] Date of Birth: 12-Apr-1996   MR: [id number]      Level of Participation: patient not present for group     Plan: to continue treatment

## 2021-06-11 NOTE — Group Note
Name: Jamie [name]        MRN: [id number]          DOB: [birth year]            Age: 25 y.o.  Admission Date: 03/16/2021       LOS: 86 days    Date of Service: 06/11/2021      Group Topic: BH Personal Values  Group Date: 06/10/2021  Start Time: 1400  End Time: 1500  Facilitators: Judi Cong          Number of Participants: 11  Group Focus: self-awareness and self-esteem  Treatment Modality: Art Therapy  Interventions utilized were assignment and exploration  Purpose: increase insight and regain self-worth          Name: Jamie [name] Date of Birth: 10-05-1996   MR: [id number]      Level of Participation: patient declined to participate in group

## 2021-06-11 NOTE — Group Note
Name: Dontravious Camille        MRN: [id number]          DOB: 28-Jul-1996            Age: 25 y.o.  Admission Date: 03/16/2021       LOS: 86 days    Date of Service: 06/11/2021      Group Topic: BH Distress Tolerance (DBT Skill)  Group Date: 06/11/2021  Start Time: 1150  End Time: 1230  Facilitators: Marijean Heath          Number of Participants: 13  Group Focus: other DBT Distress Tolerance and Coping Skills  Treatment Modality: Dialectical Behavioral Therapy and Group Psychotherapy  Interventions utilized were exploration, patient education, and support  Purpose: enhance coping skills, express feelings, increase insight, regain self-worth, and reinforce self-care          Name: Garcia Date of Birth: 07/28/1996   MR: [id number]      Level of Participation: patient not present for group

## 2021-12-15 ENCOUNTER — Encounter: Admit: 2021-12-15 | Discharge: 2021-12-15 | Payer: MEDICAID

## 2022-01-21 ENCOUNTER — Encounter: Admit: 2022-01-21 | Discharge: 2022-01-21 | Payer: MEDICAID

## 2022-05-02 ENCOUNTER — Encounter: Admit: 2022-05-02 | Discharge: 2022-05-02 | Payer: MEDICAID

## 2022-12-25 ENCOUNTER — Encounter: Admit: 2022-12-25 | Discharge: 2022-12-25 | Payer: MEDICAID

## 2023-10-02 ENCOUNTER — Encounter: Admit: 2023-10-02 | Discharge: 2023-10-02 | Payer: MEDICAID
